# Patient Record
Sex: Male | Born: 1940 | Race: Black or African American | Hispanic: No | Smoking: Former smoker
Health system: Southern US, Community
[De-identification: ages and names within clinical notes are randomized; demographics above are authoritative.]

## PROBLEM LIST (undated history)

## (undated) DIAGNOSIS — Z8611 Personal history of tuberculosis: Secondary | ICD-10-CM

## (undated) DIAGNOSIS — J439 Emphysema, unspecified: Secondary | ICD-10-CM

## (undated) DIAGNOSIS — F32A Depression, unspecified: Secondary | ICD-10-CM

## (undated) DIAGNOSIS — I10 Essential (primary) hypertension: Secondary | ICD-10-CM

## (undated) DIAGNOSIS — Z8673 Personal history of transient ischemic attack (TIA), and cerebral infarction without residual deficits: Secondary | ICD-10-CM

## (undated) DIAGNOSIS — Z21 Asymptomatic human immunodeficiency virus [HIV] infection status: Secondary | ICD-10-CM

## (undated) DIAGNOSIS — C61 Malignant neoplasm of prostate: Secondary | ICD-10-CM

## (undated) DIAGNOSIS — B20 Human immunodeficiency virus [HIV] disease: Secondary | ICD-10-CM

## (undated) DIAGNOSIS — F329 Major depressive disorder, single episode, unspecified: Secondary | ICD-10-CM

## (undated) DIAGNOSIS — Z85038 Personal history of other malignant neoplasm of large intestine: Secondary | ICD-10-CM

## (undated) DIAGNOSIS — Z8619 Personal history of other infectious and parasitic diseases: Secondary | ICD-10-CM

## (undated) DIAGNOSIS — N433 Hydrocele, unspecified: Secondary | ICD-10-CM

## (undated) DIAGNOSIS — K5790 Diverticulosis of intestine, part unspecified, without perforation or abscess without bleeding: Secondary | ICD-10-CM

## (undated) HISTORY — DX: Malignant neoplasm of prostate: C61

## (undated) HISTORY — DX: Diverticulosis of intestine, part unspecified, without perforation or abscess without bleeding: K57.90

## (undated) HISTORY — DX: Personal history of transient ischemic attack (TIA), and cerebral infarction without residual deficits: Z86.73

## (undated) HISTORY — DX: Asymptomatic human immunodeficiency virus (hiv) infection status: Z21

## (undated) HISTORY — DX: Essential (primary) hypertension: I10

## (undated) HISTORY — DX: Emphysema, unspecified: J43.9

## (undated) HISTORY — DX: Hydrocele, unspecified: N43.3

## (undated) HISTORY — DX: Personal history of other malignant neoplasm of large intestine: Z85.038

## (undated) HISTORY — DX: Human immunodeficiency virus (HIV) disease: B20

---

## 1992-05-04 HISTORY — PX: INGUINAL HERNIA REPAIR: SUR1180

## 1993-05-04 ENCOUNTER — Encounter (INDEPENDENT_AMBULATORY_CARE_PROVIDER_SITE_OTHER): Payer: Self-pay | Admitting: *Deleted

## 1993-05-04 LAB — CONVERTED CEMR LAB: CD4 T Cell Abs: 414

## 1997-09-10 ENCOUNTER — Encounter: Admission: RE | Admit: 1997-09-10 | Discharge: 1997-09-10 | Payer: Self-pay | Admitting: Internal Medicine

## 1997-09-25 ENCOUNTER — Encounter: Admission: RE | Admit: 1997-09-25 | Discharge: 1997-09-25 | Payer: Self-pay | Admitting: Internal Medicine

## 1997-12-03 ENCOUNTER — Encounter: Admission: RE | Admit: 1997-12-03 | Discharge: 1997-12-03 | Payer: Self-pay | Admitting: Internal Medicine

## 1997-12-18 ENCOUNTER — Encounter: Admission: RE | Admit: 1997-12-18 | Discharge: 1997-12-18 | Payer: Self-pay | Admitting: Internal Medicine

## 1998-02-25 ENCOUNTER — Encounter: Admission: RE | Admit: 1998-02-25 | Discharge: 1998-02-25 | Payer: Self-pay | Admitting: Internal Medicine

## 1998-02-25 ENCOUNTER — Ambulatory Visit (HOSPITAL_COMMUNITY): Admission: RE | Admit: 1998-02-25 | Discharge: 1998-02-25 | Payer: Self-pay | Admitting: Internal Medicine

## 1998-03-19 ENCOUNTER — Encounter: Admission: RE | Admit: 1998-03-19 | Discharge: 1998-03-19 | Payer: Self-pay | Admitting: Internal Medicine

## 1998-04-10 ENCOUNTER — Encounter: Admission: RE | Admit: 1998-04-10 | Discharge: 1998-04-10 | Payer: Self-pay | Admitting: Infectious Diseases

## 1998-05-13 ENCOUNTER — Encounter: Admission: RE | Admit: 1998-05-13 | Discharge: 1998-05-13 | Payer: Self-pay | Admitting: Internal Medicine

## 1998-07-22 ENCOUNTER — Encounter: Admission: RE | Admit: 1998-07-22 | Discharge: 1998-07-22 | Payer: Self-pay | Admitting: Internal Medicine

## 1998-08-07 ENCOUNTER — Encounter: Admission: RE | Admit: 1998-08-07 | Discharge: 1998-08-07 | Payer: Self-pay | Admitting: Internal Medicine

## 1998-10-22 ENCOUNTER — Ambulatory Visit (HOSPITAL_COMMUNITY): Admission: RE | Admit: 1998-10-22 | Discharge: 1998-10-22 | Payer: Self-pay | Admitting: Internal Medicine

## 1998-11-11 ENCOUNTER — Encounter: Admission: RE | Admit: 1998-11-11 | Discharge: 1998-11-11 | Payer: Self-pay | Admitting: Internal Medicine

## 1999-01-27 ENCOUNTER — Encounter: Admission: RE | Admit: 1999-01-27 | Discharge: 1999-01-27 | Payer: Self-pay | Admitting: Internal Medicine

## 1999-02-17 ENCOUNTER — Encounter: Admission: RE | Admit: 1999-02-17 | Discharge: 1999-02-17 | Payer: Self-pay | Admitting: Internal Medicine

## 1999-02-17 ENCOUNTER — Ambulatory Visit (HOSPITAL_COMMUNITY): Admission: RE | Admit: 1999-02-17 | Discharge: 1999-02-17 | Payer: Self-pay | Admitting: Internal Medicine

## 1999-03-04 ENCOUNTER — Encounter: Admission: RE | Admit: 1999-03-04 | Discharge: 1999-03-04 | Payer: Self-pay | Admitting: Internal Medicine

## 1999-03-24 ENCOUNTER — Encounter: Admission: RE | Admit: 1999-03-24 | Discharge: 1999-03-24 | Payer: Self-pay | Admitting: Internal Medicine

## 1999-04-22 ENCOUNTER — Ambulatory Visit (HOSPITAL_COMMUNITY): Admission: RE | Admit: 1999-04-22 | Discharge: 1999-04-22 | Payer: Self-pay | Admitting: Internal Medicine

## 1999-04-22 ENCOUNTER — Encounter: Payer: Self-pay | Admitting: Internal Medicine

## 1999-04-22 ENCOUNTER — Encounter: Admission: RE | Admit: 1999-04-22 | Discharge: 1999-04-22 | Payer: Self-pay | Admitting: Internal Medicine

## 1999-07-09 ENCOUNTER — Ambulatory Visit (HOSPITAL_COMMUNITY): Admission: RE | Admit: 1999-07-09 | Discharge: 1999-07-09 | Payer: Self-pay | Admitting: Internal Medicine

## 1999-07-09 ENCOUNTER — Encounter: Admission: RE | Admit: 1999-07-09 | Discharge: 1999-07-09 | Payer: Self-pay | Admitting: Internal Medicine

## 1999-08-11 ENCOUNTER — Encounter: Admission: RE | Admit: 1999-08-11 | Discharge: 1999-08-11 | Payer: Self-pay | Admitting: Internal Medicine

## 2000-02-03 ENCOUNTER — Ambulatory Visit (HOSPITAL_COMMUNITY): Admission: RE | Admit: 2000-02-03 | Discharge: 2000-02-03 | Payer: Self-pay | Admitting: Internal Medicine

## 2000-02-03 ENCOUNTER — Encounter: Admission: RE | Admit: 2000-02-03 | Discharge: 2000-02-03 | Payer: Self-pay | Admitting: Internal Medicine

## 2000-02-17 ENCOUNTER — Encounter: Admission: RE | Admit: 2000-02-17 | Discharge: 2000-02-17 | Payer: Self-pay | Admitting: Internal Medicine

## 2000-03-17 ENCOUNTER — Encounter: Admission: RE | Admit: 2000-03-17 | Discharge: 2000-03-17 | Payer: Self-pay | Admitting: Internal Medicine

## 2000-07-09 ENCOUNTER — Encounter: Admission: RE | Admit: 2000-07-09 | Discharge: 2000-07-09 | Payer: Self-pay | Admitting: Internal Medicine

## 2000-07-12 ENCOUNTER — Ambulatory Visit (HOSPITAL_COMMUNITY): Admission: RE | Admit: 2000-07-12 | Discharge: 2000-07-12 | Payer: Self-pay | Admitting: Internal Medicine

## 2000-07-12 ENCOUNTER — Encounter: Admission: RE | Admit: 2000-07-12 | Discharge: 2000-07-12 | Payer: Self-pay | Admitting: Internal Medicine

## 2000-07-23 ENCOUNTER — Encounter: Admission: RE | Admit: 2000-07-23 | Discharge: 2000-07-23 | Payer: Self-pay | Admitting: Internal Medicine

## 2000-09-02 ENCOUNTER — Encounter: Admission: RE | Admit: 2000-09-02 | Discharge: 2000-09-02 | Payer: Self-pay | Admitting: Internal Medicine

## 2000-10-12 ENCOUNTER — Encounter: Admission: RE | Admit: 2000-10-12 | Discharge: 2000-10-12 | Payer: Self-pay | Admitting: Internal Medicine

## 2001-03-07 ENCOUNTER — Ambulatory Visit (HOSPITAL_COMMUNITY): Admission: RE | Admit: 2001-03-07 | Discharge: 2001-03-07 | Payer: Self-pay | Admitting: Internal Medicine

## 2001-03-07 ENCOUNTER — Encounter: Admission: RE | Admit: 2001-03-07 | Discharge: 2001-03-07 | Payer: Self-pay | Admitting: Internal Medicine

## 2001-10-04 ENCOUNTER — Encounter: Admission: RE | Admit: 2001-10-04 | Discharge: 2001-10-04 | Payer: Self-pay | Admitting: Internal Medicine

## 2001-10-04 ENCOUNTER — Ambulatory Visit (HOSPITAL_COMMUNITY): Admission: RE | Admit: 2001-10-04 | Discharge: 2001-10-04 | Payer: Self-pay | Admitting: Internal Medicine

## 2001-10-25 ENCOUNTER — Encounter: Admission: RE | Admit: 2001-10-25 | Discharge: 2001-10-25 | Payer: Self-pay | Admitting: Internal Medicine

## 2001-11-16 ENCOUNTER — Encounter: Payer: Self-pay | Admitting: Cardiovascular Disease

## 2001-11-16 ENCOUNTER — Encounter: Payer: Self-pay | Admitting: *Deleted

## 2001-11-16 ENCOUNTER — Encounter: Payer: Self-pay | Admitting: Internal Medicine

## 2001-11-16 HISTORY — PX: TRANSTHORACIC ECHOCARDIOGRAM: SHX275

## 2001-11-17 ENCOUNTER — Inpatient Hospital Stay (HOSPITAL_COMMUNITY): Admission: EM | Admit: 2001-11-17 | Discharge: 2001-11-18 | Payer: Self-pay | Admitting: Emergency Medicine

## 2001-11-18 ENCOUNTER — Encounter: Payer: Self-pay | Admitting: Internal Medicine

## 2001-11-23 ENCOUNTER — Encounter: Admission: RE | Admit: 2001-11-23 | Discharge: 2001-11-23 | Payer: Self-pay | Admitting: Internal Medicine

## 2001-12-09 ENCOUNTER — Encounter: Admission: RE | Admit: 2001-12-09 | Discharge: 2002-03-09 | Payer: Self-pay | Admitting: Infectious Diseases

## 2001-12-27 ENCOUNTER — Encounter: Admission: RE | Admit: 2001-12-27 | Discharge: 2001-12-27 | Payer: Self-pay | Admitting: Internal Medicine

## 2002-03-27 ENCOUNTER — Encounter: Admission: RE | Admit: 2002-03-27 | Discharge: 2002-03-27 | Payer: Self-pay | Admitting: Internal Medicine

## 2002-03-27 ENCOUNTER — Ambulatory Visit (HOSPITAL_COMMUNITY): Admission: RE | Admit: 2002-03-27 | Discharge: 2002-03-27 | Payer: Self-pay | Admitting: Internal Medicine

## 2002-05-22 ENCOUNTER — Encounter: Admission: RE | Admit: 2002-05-22 | Discharge: 2002-05-22 | Payer: Self-pay | Admitting: Internal Medicine

## 2002-06-08 ENCOUNTER — Encounter: Admission: RE | Admit: 2002-06-08 | Discharge: 2002-06-08 | Payer: Self-pay | Admitting: Internal Medicine

## 2002-08-01 ENCOUNTER — Encounter: Admission: RE | Admit: 2002-08-01 | Discharge: 2002-08-01 | Payer: Self-pay | Admitting: Internal Medicine

## 2002-11-15 ENCOUNTER — Ambulatory Visit (HOSPITAL_COMMUNITY): Admission: RE | Admit: 2002-11-15 | Discharge: 2002-11-15 | Payer: Self-pay | Admitting: Internal Medicine

## 2002-11-15 ENCOUNTER — Encounter: Admission: RE | Admit: 2002-11-15 | Discharge: 2002-11-15 | Payer: Self-pay | Admitting: Internal Medicine

## 2002-11-29 ENCOUNTER — Encounter: Admission: RE | Admit: 2002-11-29 | Discharge: 2002-11-29 | Payer: Self-pay | Admitting: Internal Medicine

## 2003-03-20 ENCOUNTER — Ambulatory Visit (HOSPITAL_COMMUNITY): Admission: RE | Admit: 2003-03-20 | Discharge: 2003-03-20 | Payer: Self-pay | Admitting: Internal Medicine

## 2003-03-20 ENCOUNTER — Encounter: Admission: RE | Admit: 2003-03-20 | Discharge: 2003-03-20 | Payer: Self-pay | Admitting: Internal Medicine

## 2003-03-20 ENCOUNTER — Encounter: Payer: Self-pay | Admitting: Internal Medicine

## 2003-04-03 ENCOUNTER — Encounter: Admission: RE | Admit: 2003-04-03 | Discharge: 2003-04-03 | Payer: Self-pay | Admitting: Internal Medicine

## 2003-07-11 ENCOUNTER — Ambulatory Visit (HOSPITAL_COMMUNITY): Admission: RE | Admit: 2003-07-11 | Discharge: 2003-07-11 | Payer: Self-pay | Admitting: Internal Medicine

## 2003-07-11 ENCOUNTER — Encounter: Admission: RE | Admit: 2003-07-11 | Discharge: 2003-07-11 | Payer: Self-pay | Admitting: Internal Medicine

## 2003-07-26 ENCOUNTER — Encounter: Admission: RE | Admit: 2003-07-26 | Discharge: 2003-07-26 | Payer: Self-pay | Admitting: Internal Medicine

## 2003-11-12 ENCOUNTER — Encounter: Admission: RE | Admit: 2003-11-12 | Discharge: 2003-11-12 | Payer: Self-pay | Admitting: Internal Medicine

## 2003-11-12 ENCOUNTER — Ambulatory Visit (HOSPITAL_COMMUNITY): Admission: RE | Admit: 2003-11-12 | Discharge: 2003-11-12 | Payer: Self-pay | Admitting: Internal Medicine

## 2003-11-26 ENCOUNTER — Encounter: Admission: RE | Admit: 2003-11-26 | Discharge: 2003-11-26 | Payer: Self-pay | Admitting: Internal Medicine

## 2004-03-04 ENCOUNTER — Ambulatory Visit: Payer: Self-pay | Admitting: Internal Medicine

## 2004-03-04 ENCOUNTER — Ambulatory Visit (HOSPITAL_COMMUNITY): Admission: RE | Admit: 2004-03-04 | Discharge: 2004-03-04 | Payer: Self-pay | Admitting: Internal Medicine

## 2004-03-18 ENCOUNTER — Ambulatory Visit: Payer: Self-pay | Admitting: Internal Medicine

## 2004-09-15 ENCOUNTER — Ambulatory Visit: Payer: Self-pay | Admitting: Internal Medicine

## 2004-09-15 ENCOUNTER — Ambulatory Visit (HOSPITAL_COMMUNITY): Admission: RE | Admit: 2004-09-15 | Discharge: 2004-09-15 | Payer: Self-pay | Admitting: Internal Medicine

## 2004-09-30 ENCOUNTER — Ambulatory Visit: Payer: Self-pay | Admitting: Internal Medicine

## 2005-01-21 ENCOUNTER — Encounter (INDEPENDENT_AMBULATORY_CARE_PROVIDER_SITE_OTHER): Payer: Self-pay | Admitting: *Deleted

## 2005-01-21 ENCOUNTER — Ambulatory Visit (HOSPITAL_COMMUNITY): Admission: RE | Admit: 2005-01-21 | Discharge: 2005-01-21 | Payer: Self-pay | Admitting: Internal Medicine

## 2005-01-21 ENCOUNTER — Ambulatory Visit: Payer: Self-pay | Admitting: Internal Medicine

## 2005-02-10 ENCOUNTER — Ambulatory Visit: Payer: Self-pay | Admitting: Internal Medicine

## 2005-07-08 ENCOUNTER — Encounter (INDEPENDENT_AMBULATORY_CARE_PROVIDER_SITE_OTHER): Payer: Self-pay | Admitting: *Deleted

## 2005-07-08 ENCOUNTER — Encounter: Payer: Self-pay | Admitting: Internal Medicine

## 2005-07-08 ENCOUNTER — Ambulatory Visit: Payer: Self-pay | Admitting: Internal Medicine

## 2005-07-08 ENCOUNTER — Encounter: Admission: RE | Admit: 2005-07-08 | Discharge: 2005-07-08 | Payer: Self-pay | Admitting: Internal Medicine

## 2005-07-08 LAB — CONVERTED CEMR LAB
CD4 % Helper T Cell: 35 %
CD4 Count: 600 microliters

## 2005-07-23 ENCOUNTER — Ambulatory Visit: Payer: Self-pay | Admitting: Internal Medicine

## 2005-12-30 ENCOUNTER — Ambulatory Visit: Payer: Self-pay | Admitting: Internal Medicine

## 2005-12-30 ENCOUNTER — Encounter: Admission: RE | Admit: 2005-12-30 | Discharge: 2005-12-30 | Payer: Self-pay | Admitting: Internal Medicine

## 2005-12-30 ENCOUNTER — Encounter (INDEPENDENT_AMBULATORY_CARE_PROVIDER_SITE_OTHER): Payer: Self-pay | Admitting: *Deleted

## 2005-12-30 ENCOUNTER — Encounter: Payer: Self-pay | Admitting: Internal Medicine

## 2005-12-30 LAB — CONVERTED CEMR LAB
CD4 Count: 1000 microliters
HIV 1 RNA Quant: 49 copies/mL

## 2006-01-19 ENCOUNTER — Ambulatory Visit: Payer: Self-pay | Admitting: Internal Medicine

## 2006-02-02 ENCOUNTER — Ambulatory Visit: Payer: Self-pay | Admitting: Gastroenterology

## 2006-02-11 ENCOUNTER — Encounter: Payer: Self-pay | Admitting: Gastroenterology

## 2006-02-11 ENCOUNTER — Ambulatory Visit (HOSPITAL_COMMUNITY): Admission: RE | Admit: 2006-02-11 | Discharge: 2006-02-11 | Payer: Self-pay | Admitting: Gastroenterology

## 2006-02-11 ENCOUNTER — Encounter (INDEPENDENT_AMBULATORY_CARE_PROVIDER_SITE_OTHER): Payer: Self-pay | Admitting: Specialist

## 2006-02-18 ENCOUNTER — Ambulatory Visit: Payer: Self-pay | Admitting: Gastroenterology

## 2006-02-23 ENCOUNTER — Ambulatory Visit: Payer: Self-pay | Admitting: Cardiology

## 2006-03-09 ENCOUNTER — Encounter: Payer: Self-pay | Admitting: Gastroenterology

## 2006-03-09 ENCOUNTER — Encounter (INDEPENDENT_AMBULATORY_CARE_PROVIDER_SITE_OTHER): Payer: Self-pay | Admitting: Specialist

## 2006-03-09 ENCOUNTER — Inpatient Hospital Stay (HOSPITAL_COMMUNITY): Admission: RE | Admit: 2006-03-09 | Discharge: 2006-03-14 | Payer: Self-pay | Admitting: Surgery

## 2006-03-09 HISTORY — PX: OTHER SURGICAL HISTORY: SHX169

## 2006-03-10 ENCOUNTER — Ambulatory Visit: Payer: Self-pay | Admitting: Infectious Diseases

## 2006-03-30 ENCOUNTER — Ambulatory Visit: Payer: Self-pay | Admitting: Hematology and Oncology

## 2006-04-06 LAB — CBC WITH DIFFERENTIAL/PLATELET
Basophils Absolute: 0.1 10*3/uL (ref 0.0–0.1)
EOS%: 5 % (ref 0.0–7.0)
Eosinophils Absolute: 0.4 10*3/uL (ref 0.0–0.5)
HGB: 13.1 g/dL (ref 13.0–17.1)
LYMPH%: 24.9 % (ref 14.0–48.0)
MCH: 27.4 pg — ABNORMAL LOW (ref 28.0–33.4)
MCV: 83.9 fL (ref 81.6–98.0)
MONO%: 7.2 % (ref 0.0–13.0)
Platelets: 294 10*3/uL (ref 145–400)
RBC: 4.8 10*6/uL (ref 4.20–5.71)
RDW: 12.6 % (ref 11.2–14.6)

## 2006-04-06 LAB — COMPREHENSIVE METABOLIC PANEL
AST: 16 U/L (ref 0–37)
Albumin: 4.3 g/dL (ref 3.5–5.2)
Alkaline Phosphatase: 72 U/L (ref 39–117)
BUN: 10 mg/dL (ref 6–23)
Glucose, Bld: 107 mg/dL — ABNORMAL HIGH (ref 70–99)
Potassium: 3.6 mEq/L (ref 3.5–5.3)
Total Bilirubin: 0.2 mg/dL — ABNORMAL LOW (ref 0.3–1.2)

## 2006-04-06 LAB — CEA: CEA: 1.4 ng/mL (ref 0.0–5.0)

## 2006-05-15 ENCOUNTER — Encounter: Payer: Self-pay | Admitting: Internal Medicine

## 2006-05-15 DIAGNOSIS — B20 Human immunodeficiency virus [HIV] disease: Secondary | ICD-10-CM | POA: Insufficient documentation

## 2006-05-15 DIAGNOSIS — I10 Essential (primary) hypertension: Secondary | ICD-10-CM | POA: Insufficient documentation

## 2006-05-15 DIAGNOSIS — Z8619 Personal history of other infectious and parasitic diseases: Secondary | ICD-10-CM

## 2006-05-15 DIAGNOSIS — Z8719 Personal history of other diseases of the digestive system: Secondary | ICD-10-CM | POA: Insufficient documentation

## 2006-05-15 DIAGNOSIS — Z87891 Personal history of nicotine dependence: Secondary | ICD-10-CM

## 2006-05-15 DIAGNOSIS — A539 Syphilis, unspecified: Secondary | ICD-10-CM | POA: Insufficient documentation

## 2006-05-15 DIAGNOSIS — A150 Tuberculosis of lung: Secondary | ICD-10-CM | POA: Insufficient documentation

## 2006-05-15 DIAGNOSIS — F528 Other sexual dysfunction not due to a substance or known physiological condition: Secondary | ICD-10-CM | POA: Insufficient documentation

## 2006-05-15 DIAGNOSIS — Z8673 Personal history of transient ischemic attack (TIA), and cerebral infarction without residual deficits: Secondary | ICD-10-CM | POA: Insufficient documentation

## 2006-06-22 ENCOUNTER — Encounter: Admission: RE | Admit: 2006-06-22 | Discharge: 2006-06-22 | Payer: Self-pay | Admitting: Internal Medicine

## 2006-06-22 ENCOUNTER — Encounter (INDEPENDENT_AMBULATORY_CARE_PROVIDER_SITE_OTHER): Payer: Self-pay | Admitting: *Deleted

## 2006-06-22 ENCOUNTER — Ambulatory Visit: Payer: Self-pay | Admitting: Internal Medicine

## 2006-06-22 LAB — CONVERTED CEMR LAB: CD4 Count: 840 microliters

## 2006-06-28 ENCOUNTER — Encounter (INDEPENDENT_AMBULATORY_CARE_PROVIDER_SITE_OTHER): Payer: Self-pay | Admitting: *Deleted

## 2006-06-28 LAB — CONVERTED CEMR LAB

## 2006-07-11 ENCOUNTER — Encounter (INDEPENDENT_AMBULATORY_CARE_PROVIDER_SITE_OTHER): Payer: Self-pay | Admitting: *Deleted

## 2006-07-13 DIAGNOSIS — Z9889 Other specified postprocedural states: Secondary | ICD-10-CM

## 2006-07-22 ENCOUNTER — Ambulatory Visit: Payer: Self-pay | Admitting: Internal Medicine

## 2006-07-28 ENCOUNTER — Ambulatory Visit: Payer: Self-pay | Admitting: Hematology and Oncology

## 2006-07-30 LAB — COMPREHENSIVE METABOLIC PANEL
ALT: 24 U/L (ref 0–53)
AST: 17 U/L (ref 0–37)
Albumin: 4.2 g/dL (ref 3.5–5.2)
Alkaline Phosphatase: 72 U/L (ref 39–117)
BUN: 18 mg/dL (ref 6–23)
Chloride: 100 mEq/L (ref 96–112)
Potassium: 3.9 mEq/L (ref 3.5–5.3)
Sodium: 139 mEq/L (ref 135–145)

## 2006-07-30 LAB — CBC WITH DIFFERENTIAL/PLATELET
BASO%: 1.4 % (ref 0.0–2.0)
EOS%: 3.7 % (ref 0.0–7.0)
MCH: 28 pg (ref 28.0–33.4)
MCHC: 33.4 g/dL (ref 32.0–35.9)
MCV: 83.7 fL (ref 81.6–98.0)
MONO%: 7.2 % (ref 0.0–13.0)
RBC: 4.97 10*6/uL (ref 4.20–5.71)
RDW: 15 % — ABNORMAL HIGH (ref 11.2–14.6)
lymph#: 1.9 10*3/uL (ref 0.9–3.3)

## 2006-08-03 ENCOUNTER — Ambulatory Visit (HOSPITAL_COMMUNITY): Admission: RE | Admit: 2006-08-03 | Discharge: 2006-08-03 | Payer: Self-pay | Admitting: Hematology and Oncology

## 2006-08-05 ENCOUNTER — Encounter: Payer: Self-pay | Admitting: Internal Medicine

## 2006-08-18 ENCOUNTER — Ambulatory Visit (HOSPITAL_COMMUNITY): Admission: RE | Admit: 2006-08-18 | Discharge: 2006-08-18 | Payer: Self-pay | Admitting: Gastroenterology

## 2006-08-18 ENCOUNTER — Ambulatory Visit: Payer: Self-pay | Admitting: Gastroenterology

## 2006-12-06 ENCOUNTER — Ambulatory Visit: Payer: Self-pay | Admitting: Hematology and Oncology

## 2006-12-20 ENCOUNTER — Encounter: Admission: RE | Admit: 2006-12-20 | Discharge: 2006-12-20 | Payer: Self-pay | Admitting: Internal Medicine

## 2006-12-20 ENCOUNTER — Ambulatory Visit: Payer: Self-pay | Admitting: Internal Medicine

## 2006-12-20 LAB — CONVERTED CEMR LAB
HIV 1 RNA Quant: <50 copies/mL (ref <50)
HIV-1 RNA Quant, Log: <1.7 (ref <1.70)

## 2006-12-22 ENCOUNTER — Encounter: Payer: Self-pay | Admitting: Internal Medicine

## 2006-12-22 LAB — CBC WITH DIFFERENTIAL/PLATELET
Basophils Absolute: 0.1 10*3/uL (ref 0.0–0.1)
EOS%: 2.6 % (ref 0.0–7.0)
Eosinophils Absolute: 0.2 10*3/uL (ref 0.0–0.5)
LYMPH%: 22.6 % (ref 14.0–48.0)
MCH: 28.9 pg (ref 28.0–33.4)
MCV: 85 fL (ref 81.6–98.0)
MONO%: 4.8 % (ref 0.0–13.0)
NEUT#: 6 10*3/uL (ref 1.5–6.5)
Platelets: 240 10*3/uL (ref 145–400)
RBC: 5 10*6/uL (ref 4.20–5.71)

## 2006-12-22 LAB — COMPREHENSIVE METABOLIC PANEL
AST: 21 U/L (ref 0–37)
Alkaline Phosphatase: 77 U/L (ref 39–117)
BUN: 11 mg/dL (ref 6–23)
Glucose, Bld: 84 mg/dL (ref 70–99)
Total Bilirubin: 0.3 mg/dL (ref 0.3–1.2)

## 2007-01-10 ENCOUNTER — Ambulatory Visit: Payer: Self-pay | Admitting: Internal Medicine

## 2007-01-10 LAB — CONVERTED CEMR LAB
ALT: 18 units/L (ref 0–53)
AST: 14 units/L (ref 0–37)
Albumin: 4.3 g/dL (ref 3.5–5.2)
Alkaline Phosphatase: 76 units/L (ref 39–117)
Calcium: 9.9 mg/dL (ref 8.4–10.5)
Chloride: 105 meq/L (ref 96–112)
MCHC: 33.6 g/dL (ref 30.0–36.0)
Platelets: 267 10*3/uL (ref 150–400)
Potassium: 3.8 meq/L (ref 3.5–5.3)
RDW: 14.4 % — ABNORMAL HIGH (ref 11.5–14.0)
Sodium: 141 meq/L (ref 135–145)
Total Protein: 7.9 g/dL (ref 6.0–8.3)

## 2007-01-24 ENCOUNTER — Encounter: Payer: Self-pay | Admitting: Internal Medicine

## 2007-02-03 ENCOUNTER — Ambulatory Visit: Payer: Self-pay | Admitting: Gastroenterology

## 2007-02-15 ENCOUNTER — Ambulatory Visit: Payer: Self-pay | Admitting: Internal Medicine

## 2007-02-17 ENCOUNTER — Ambulatory Visit: Payer: Self-pay | Admitting: Gastroenterology

## 2007-03-22 ENCOUNTER — Ambulatory Visit: Payer: Self-pay | Admitting: Hematology and Oncology

## 2007-03-24 ENCOUNTER — Encounter: Payer: Self-pay | Admitting: Internal Medicine

## 2007-03-24 LAB — COMPREHENSIVE METABOLIC PANEL
ALT: 23 U/L (ref 0–53)
AST: 22 U/L (ref 0–37)
Albumin: 3.8 g/dL (ref 3.5–5.2)
Alkaline Phosphatase: 75 U/L (ref 39–117)
Calcium: 9.7 mg/dL (ref 8.4–10.5)
Chloride: 102 mEq/L (ref 96–112)
Potassium: 3.7 mEq/L (ref 3.5–5.3)

## 2007-03-24 LAB — CBC WITH DIFFERENTIAL/PLATELET
BASO%: 0.8 % (ref 0.0–2.0)
EOS%: 5.4 % (ref 0.0–7.0)
HCT: 41.5 % (ref 38.7–49.9)
HGB: 14 g/dL (ref 13.0–17.1)
MCV: 85.3 fL (ref 81.6–98.0)
MONO%: 7.1 % (ref 0.0–13.0)
NEUT#: 3.4 10*3/uL (ref 1.5–6.5)
Platelets: 221 10*3/uL (ref 145–400)
RBC: 4.87 10*6/uL (ref 4.20–5.71)
RDW: 15.1 % — ABNORMAL HIGH (ref 11.2–14.6)
WBC: 6.5 10*3/uL (ref 4.0–10.0)
lymph#: 2.2 10*3/uL (ref 0.9–3.3)

## 2007-04-11 ENCOUNTER — Telehealth: Payer: Self-pay | Admitting: Internal Medicine

## 2007-05-03 ENCOUNTER — Encounter: Payer: Self-pay | Admitting: Internal Medicine

## 2007-07-06 ENCOUNTER — Encounter: Admission: RE | Admit: 2007-07-06 | Discharge: 2007-07-06 | Payer: Self-pay | Admitting: Internal Medicine

## 2007-07-06 ENCOUNTER — Ambulatory Visit: Payer: Self-pay | Admitting: Internal Medicine

## 2007-07-06 LAB — CONVERTED CEMR LAB
ALT: 20 units/L (ref 0–53)
AST: 17 units/L (ref 0–37)
Albumin: 4 g/dL (ref 3.5–5.2)
Alkaline Phosphatase: 81 units/L (ref 39–117)
BUN: 9 mg/dL (ref 6–23)
HDL: 33 mg/dL — ABNORMAL LOW (ref >39)
HIV-1 RNA Quant, Log: <1.7 (ref <1.70)
Hemoglobin: 14.3 g/dL (ref 13.0–17.0)
LDL Cholesterol: 90 mg/dL (ref 0–99)
MCHC: 32.8 g/dL (ref 30.0–36.0)
Platelets: 251 10*3/uL (ref 150–400)
Potassium: 3.9 meq/L (ref 3.5–5.3)
RDW: 14.9 % (ref 11.5–15.5)
RPR Ser Ql: REACTIVE — AB
RPR Titer: 1:8 {titer}
Total CHOL/HDL Ratio: 4.5

## 2007-07-19 ENCOUNTER — Ambulatory Visit: Payer: Self-pay | Admitting: Internal Medicine

## 2007-08-16 ENCOUNTER — Ambulatory Visit: Payer: Self-pay | Admitting: Hematology and Oncology

## 2007-08-18 ENCOUNTER — Ambulatory Visit (HOSPITAL_COMMUNITY): Admission: RE | Admit: 2007-08-18 | Discharge: 2007-08-18 | Payer: Self-pay | Admitting: Hematology and Oncology

## 2007-08-24 ENCOUNTER — Encounter: Payer: Self-pay | Admitting: Internal Medicine

## 2007-08-24 LAB — CBC WITH DIFFERENTIAL/PLATELET
BASO%: 0.6 % (ref 0.0–2.0)
EOS%: 2 % (ref 0.0–7.0)
MCH: 28.7 pg (ref 28.0–33.4)
MCHC: 33.9 g/dL (ref 32.0–35.9)
MCV: 84.6 fL (ref 81.6–98.0)
MONO%: 3.8 % (ref 0.0–13.0)
RBC: 5.34 10*6/uL (ref 4.20–5.71)
RDW: 14.1 % (ref 11.2–14.6)
lymph#: 2 10*3/uL (ref 0.9–3.3)

## 2007-08-24 LAB — COMPREHENSIVE METABOLIC PANEL
ALT: 30 U/L (ref 0–53)
AST: 24 U/L (ref 0–37)
Albumin: 4.2 g/dL (ref 3.5–5.2)
Alkaline Phosphatase: 87 U/L (ref 39–117)
Calcium: 9.3 mg/dL (ref 8.4–10.5)
Chloride: 99 mEq/L (ref 96–112)
Potassium: 3.2 mEq/L — ABNORMAL LOW (ref 3.5–5.3)

## 2007-11-07 IMAGING — CR DG ABDOMEN 1V
1 series · 1 of 1 positions shown · non-contrast
Comparison: 08/03/06.

CLINICAL DATA: Constipation and abdominal pain.  
 ABDOMEN ? 1 VIEW:

[t abdomen supine]
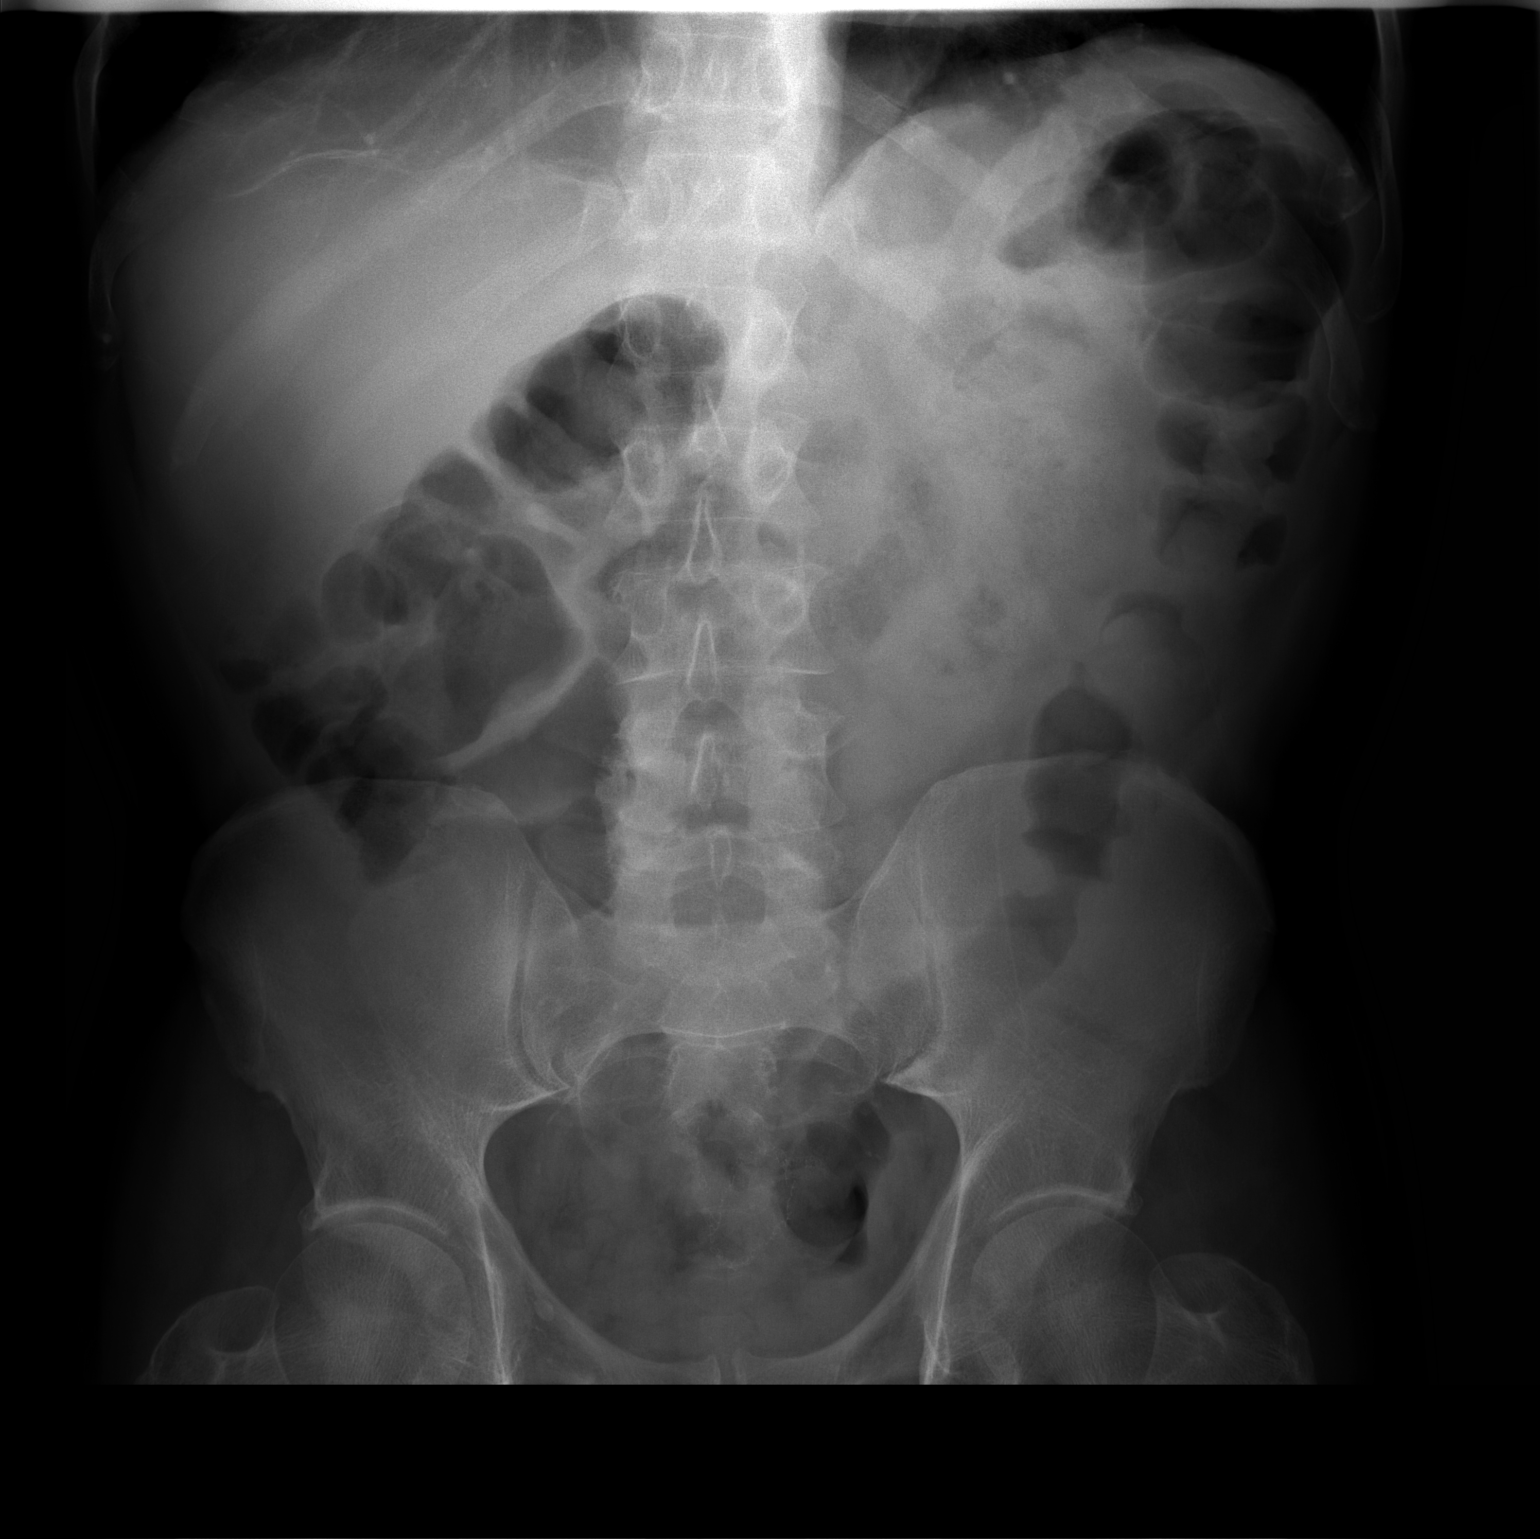

[1 of 1 positions shown; findings below may reference images not displayed]

FINDINGS: Gas and stool are seen in the colon.  No small bowel dilatation.
IMPRESSION: No acute findings.

## 2007-12-13 ENCOUNTER — Encounter: Admission: RE | Admit: 2007-12-13 | Discharge: 2007-12-13 | Payer: Self-pay | Admitting: Internal Medicine

## 2007-12-13 ENCOUNTER — Ambulatory Visit: Payer: Self-pay | Admitting: Internal Medicine

## 2007-12-13 LAB — CONVERTED CEMR LAB
ALT: 21 units/L (ref 0–53)
AST: 17 units/L (ref 0–37)
Creatinine, Ser: 1.03 mg/dL (ref 0.40–1.50)
HCT: 42.4 % (ref 39.0–52.0)
HIV-1 RNA Quant, Log: 1.97 — ABNORMAL HIGH (ref <1.70)
MCV: 85.3 fL (ref 78.0–100.0)
Platelets: 243 10*3/uL (ref 150–400)
RDW: 15.7 % — ABNORMAL HIGH (ref 11.5–15.5)
Total Bilirubin: 0.3 mg/dL (ref 0.3–1.2)

## 2007-12-27 ENCOUNTER — Ambulatory Visit: Payer: Self-pay | Admitting: Internal Medicine

## 2008-03-13 ENCOUNTER — Telehealth (INDEPENDENT_AMBULATORY_CARE_PROVIDER_SITE_OTHER): Payer: Self-pay | Admitting: *Deleted

## 2008-03-16 ENCOUNTER — Ambulatory Visit: Payer: Self-pay | Admitting: Internal Medicine

## 2008-03-16 DIAGNOSIS — K5900 Constipation, unspecified: Secondary | ICD-10-CM | POA: Insufficient documentation

## 2008-04-10 ENCOUNTER — Telehealth: Payer: Self-pay | Admitting: Internal Medicine

## 2008-05-24 ENCOUNTER — Ambulatory Visit: Payer: Self-pay | Admitting: Internal Medicine

## 2008-05-24 LAB — CONVERTED CEMR LAB
AST: 20 units/L (ref 0–37)
Albumin: 4.2 g/dL (ref 3.5–5.2)
BUN: 20 mg/dL (ref 6–23)
Basophils Relative: 1 % (ref 0–1)
Calcium: 9.7 mg/dL (ref 8.4–10.5)
Chloride: 102 meq/L (ref 96–112)
Glucose, Bld: 86 mg/dL (ref 70–99)
HDL: 39 mg/dL — ABNORMAL LOW (ref >39)
HIV 1 RNA Quant: <48 copies/mL (ref <48)
HIV-1 RNA Quant, Log: <1.68 (ref <1.68)
Lymphs Abs: 2 10*3/uL (ref 0.7–4.0)
Monocytes Relative: 6 % (ref 3–12)
Neutro Abs: 4.6 10*3/uL (ref 1.7–7.7)
Neutrophils Relative %: 62 % (ref 43–77)
Potassium: 4 meq/L (ref 3.5–5.3)
RBC: 5.34 M/uL (ref 4.22–5.81)
RPR Ser Ql: REACTIVE — AB
RPR Titer: 1:2 {titer}
WBC: 7.3 10*3/uL (ref 4.0–10.5)

## 2008-06-07 ENCOUNTER — Ambulatory Visit: Payer: Self-pay | Admitting: Internal Medicine

## 2008-06-27 DIAGNOSIS — D126 Benign neoplasm of colon, unspecified: Secondary | ICD-10-CM

## 2008-06-28 ENCOUNTER — Ambulatory Visit: Payer: Self-pay | Admitting: Gastroenterology

## 2008-08-06 ENCOUNTER — Ambulatory Visit: Payer: Self-pay | Admitting: Gastroenterology

## 2008-12-03 ENCOUNTER — Ambulatory Visit: Payer: Self-pay | Admitting: Internal Medicine

## 2008-12-03 LAB — CONVERTED CEMR LAB
HIV 1 RNA Quant: 77 copies/mL — ABNORMAL HIGH (ref <48)
HIV-1 RNA Quant, Log: 1.89 — ABNORMAL HIGH (ref <1.68)

## 2008-12-18 ENCOUNTER — Ambulatory Visit: Payer: Self-pay | Admitting: Internal Medicine

## 2008-12-18 DIAGNOSIS — R634 Abnormal weight loss: Secondary | ICD-10-CM | POA: Insufficient documentation

## 2009-03-18 ENCOUNTER — Ambulatory Visit: Payer: Self-pay | Admitting: Internal Medicine

## 2009-03-18 LAB — CONVERTED CEMR LAB
ALT: 16 units/L (ref 0–53)
AST: 17 units/L (ref 0–37)
Albumin: 4.4 g/dL (ref 3.5–5.2)
Alkaline Phosphatase: 75 units/L (ref 39–117)
Calcium: 9.5 mg/dL (ref 8.4–10.5)
Chloride: 102 meq/L (ref 96–112)
HCT: 43.8 % (ref 39.0–52.0)
Lymphocytes Relative: 26 % (ref 12–46)
Lymphs Abs: 1.7 10*3/uL (ref 0.7–4.0)
Neutro Abs: 4.4 10*3/uL (ref 1.7–7.7)
Neutrophils Relative %: 65 % (ref 43–77)
Platelets: 232 10*3/uL (ref 150–400)
Potassium: 4.1 meq/L (ref 3.5–5.3)
Sodium: 140 meq/L (ref 135–145)
WBC: 6.8 10*3/uL (ref 4.0–10.5)

## 2009-04-02 ENCOUNTER — Ambulatory Visit: Payer: Self-pay | Admitting: Internal Medicine

## 2009-10-29 ENCOUNTER — Ambulatory Visit: Payer: Self-pay | Admitting: Internal Medicine

## 2009-10-29 LAB — CONVERTED CEMR LAB
ALT: 18 units/L (ref 0–53)
BUN: 12 mg/dL (ref 6–23)
Basophils Relative: 1 % (ref 0–1)
CO2: 26 meq/L (ref 19–32)
Calcium: 9.9 mg/dL (ref 8.4–10.5)
Cholesterol: 182 mg/dL (ref 0–200)
Creatinine, Ser: 1.09 mg/dL (ref 0.40–1.50)
Eosinophils Absolute: 0.2 10*3/uL (ref 0.0–0.7)
Eosinophils Relative: 2 % (ref 0–5)
HCT: 44 % (ref 39.0–52.0)
HDL: 45 mg/dL (ref >39)
HIV-1 RNA Quant, Log: <1.68 (ref <1.68)
MCHC: 33.4 g/dL (ref 30.0–36.0)
MCV: 86.3 fL (ref 78.0–100.0)
Monocytes Relative: 5 % (ref 3–12)
Neutrophils Relative %: 67 % (ref 43–77)
Platelets: 224 10*3/uL (ref 150–400)
RPR Titer: 1:4 {titer}
Total Bilirubin: 0.4 mg/dL (ref 0.3–1.2)
Total CHOL/HDL Ratio: 4
VLDL: 19 mg/dL (ref 0–40)

## 2009-11-21 ENCOUNTER — Ambulatory Visit: Payer: Self-pay | Admitting: Internal Medicine

## 2010-04-03 ENCOUNTER — Ambulatory Visit: Payer: Self-pay | Admitting: Internal Medicine

## 2010-04-03 LAB — CONVERTED CEMR LAB
ALT: 19 units/L (ref 0–53)
Albumin: 4.3 g/dL (ref 3.5–5.2)
CO2: 27 meq/L (ref 19–32)
Calcium: 9.2 mg/dL (ref 8.4–10.5)
Chloride: 102 meq/L (ref 96–112)
Cholesterol: 165 mg/dL (ref 0–200)
Glucose, Bld: 91 mg/dL (ref 70–99)
HIV 1 RNA Quant: <20 copies/mL (ref <20)
Potassium: 5.2 meq/L (ref 3.5–5.3)
RPR Titer: 1:4 {titer}
Sodium: 139 meq/L (ref 135–145)
T pallidum Antibodies (TP-PA): >8 — ABNORMAL HIGH (ref <0.90)
Total Protein: 7.4 g/dL (ref 6.0–8.3)
Triglycerides: 53 mg/dL (ref <150)
VLDL: 11 mg/dL (ref 0–40)

## 2010-04-14 ENCOUNTER — Ambulatory Visit: Payer: Self-pay | Admitting: Gastroenterology

## 2010-04-22 ENCOUNTER — Ambulatory Visit: Payer: Self-pay | Admitting: Internal Medicine

## 2010-04-22 DIAGNOSIS — Z85048 Personal history of other malignant neoplasm of rectum, rectosigmoid junction, and anus: Secondary | ICD-10-CM

## 2010-05-04 DIAGNOSIS — N433 Hydrocele, unspecified: Secondary | ICD-10-CM

## 2010-05-04 HISTORY — DX: Hydrocele, unspecified: N43.3

## 2010-06-01 LAB — CONVERTED CEMR LAB
ALT: 25 units/L (ref 0–53)
Basophils Absolute: 0.1 10*3/uL (ref 0.0–0.1)
CD4 Count: 840 microliters
CO2: 32 meq/L (ref 19–32)
Calcium: 9.5 mg/dL (ref 8.4–10.5)
Chloride: 102 meq/L (ref 96–112)
Cholesterol: 138 mg/dL (ref 0–200)
Creatinine, Ser: 0.98 mg/dL (ref 0.40–1.50)
Eosinophils Relative: 4 % (ref 0–5)
Glucose, Bld: 90 mg/dL (ref 70–99)
HCT: 42.9 % (ref 39.0–52.0)
HIV 1 RNA Quant: 231 copies/mL — ABNORMAL HIGH (ref <50)
Hemoglobin: 13.7 g/dL (ref 13.0–17.0)
Lymphocytes Relative: 27 % (ref 12–46)
Lymphs Abs: 2.2 10*3/uL (ref 0.7–3.3)
Monocytes Absolute: 0.5 10*3/uL (ref 0.2–0.7)
Neutro Abs: 4.9 10*3/uL (ref 1.7–7.7)
RBC: 4.97 M/uL (ref 4.22–5.81)
RPR Titer: 1:2 {titer}
Total Bilirubin: 0.3 mg/dL (ref 0.3–1.2)
Total CHOL/HDL Ratio: 4.2
Total Protein: 7.4 g/dL (ref 6.0–8.3)
Triglycerides: 181 mg/dL — ABNORMAL HIGH (ref <150)
VLDL: 36 mg/dL (ref 0–40)
WBC: 7.9 10*3/uL (ref 4.0–10.5)

## 2010-06-05 NOTE — Assessment & Plan Note (Signed)
Summary: RESHFROM 7/12  F/U [MKJ]   CC:  follow-up visit, some problems w/ hard stools, and using OTC stool softener.  RN reminded pt. to drink more fluids to make the stool stay soft.  Preventive Screening-Counseling & Management  Alcohol-Tobacco     Alcohol drinks/day: 0     Smoking Status: quit     Smoking Cessation Counseling: 02/10/2005     Packs/Day: 0.25     Year Quit: 2007     Passive Smoke Exposure: yes  Caffeine-Diet-Exercise     Caffeine use/day: yes     Does Patient Exercise: no     Type of exercise: alot of waliking at his job     Times/week: 5  Hep-HIV-STD-Contraception     HIV Risk: no risk noted     HIV Risk Counseling: to avoid increased HIV risk  Safety-Violence-Falls     Seat Belt Use: yes  Comments: declined condoms      Sexual History:  currently monogamous.        Drug Use:  never.     Current Allergies (reviewed today): No known allergies  Social History: Sexual History:  currently monogamous  Vital Signs:  Patient profile:   70 year old male Height:      66 inches (167.64 cm) Weight:      145.75 pounds (66.25 kg) BMI:     23.61 Temp:     98.6 degrees F (37.00 degrees C) oral Pulse rate:   84 / minute BP sitting:   143 / 87  (left arm) Cuff size:   regular  Vitals Entered By: Jennet Maduro RN (November 21, 2009 9:34 AM) CC: follow-up visit, some problems w/ hard stools, using OTC stool softener.  RN reminded pt. to drink more fluids to make the stool stay soft Is Patient Diabetic? No Pain Assessment Patient in pain? no      Nutritional Status BMI of 19 -24 = normal Nutritional Status Detail appetite "OK"  Have you ever been in a relationship where you felt threatened, hurt or afraid?No   Does patient need assistance? Functional Status Self care Ambulation Normal Comments missed one night time dose      Appended Document: RESHFROM 7/12  F/U [MKJ]     Primary Provider:  Cliffton Asters MD   History of Present  Illness: Charles Daniel is in for his routine visit.  He denies missing a single dose of his Atripla  since his last visit.  He does, however, say that he tends to miss his blood pressure medication. He takes it each morning and often forgets to take it before going to work.  He is feeling well and denies any problems other than mild intermittent constipation.  He says that he tends to get this after eating more on weekends.  It occurs every two to 3 weeks often prompting him to take fiber supplements and occasionally ex-lax.  He is actually active with his wife says that he always uses condoms.  Allergies: No Known Drug Allergies  Physical Exam  General:  alert and well-nourished.   Mouth:  full dentures.pharynx pink and moist, no erythema, no exudates, and edentulous.   Lungs:  normal breath sounds, no crackles, and no wheezes.   Heart:  normal rate, regular rhythm, and no murmur.   Skin:  no rashes.   Psych:  normally interactive, good eye contact, not anxious appearing, and not depressed appearing.          Medication Adherence: 11/21/2009   Adherence  to medications reviewed with patient. Counseling to provide adequate adherence provided   Prevention For Positives: 11/21/2009   Safe sex practices discussed with patient. Condoms offered.                             Impression & Recommendations:  Problem # 1:  HIV DISEASE (ICD-042) His HIV infection remains under excellent control.  I will continue his current regimen. Diagnostics Reviewed:  CD4: 860 (10/30/2009)   CD4 %: 40 (06/22/2006) WBC: 8.5 (10/29/2009)   Hgb: 14.7 (10/29/2009)   HCT: 44.0 (10/29/2009)   Platelets: 224 (10/29/2009) HIV-1 RNA: <48 copies/mL (10/29/2009)   HBSAg: No (06/28/2006)  Problem # 2:  HYPERTENSION (ICD-401.9) I have asked him to keep a small supply of his hydrochlorothiazide with him when he is away from home in case he misses his morning dose.  If misses continue to be a problem I will switch him to a  nondiuretic regimen which he can take at night with his Atripla His updated medication list for this problem includes:    Hydrochlorothiazide 50 Mg Tabs (Hydrochlorothiazide) .Marland Kitchen... Take 1 tablet by mouth once a day  Orders: Est. Patient Level IV (40102)  Prior BP: 143/87 (11/21/2009)  Labs Reviewed: K+: 3.7 (10/29/2009) Creat: : 1.09 (10/29/2009)   Chol: 182 (10/29/2009)   HDL: 45 (10/29/2009)   LDL: 118 (10/29/2009)   TG: 94 (10/29/2009)  Problem # 3:  CONSTIPATION (ICD-564.00)  I suggested that he take daily fiber supplement along with more water. Orders: Est. Patient Level IV (72536)  Problem # 4:  ERECTILE DYSFUNCTION (ICD-302.72) I have agreed to refill his Viagra. His updated medication list for this problem includes:    Viagra 50 Mg Tabs (Sildenafil citrate) .Marland Kitchen... Take 1 tablet by mouth as directed  Orders: Est. Patient Level IV (64403)  Other Orders: Future Orders: T-CD4SP ([facility]) (CD4SP) ... 05/20/2010 T-HIV Viral Load [id number]) ... 05/20/2010 T-Comprehensive Metabolic Panel [phone]) ... 05/20/2010 T-CBC w/Diff ([id number]) ... 05/20/2010 T-RPR (Syphilis) [phone]) ... 05/20/2010 T-Lipid Profile [id number]) ... 05/20/2010  Patient Instructions: 1)  Please schedule a follow-up appointment in 6 months.  Prescriptions: VIAGRA 50 MG TABS (SILDENAFIL CITRATE) Take 1 tablet by mouth as directed  #30 x 1   Entered and Authorized by:   Cliffton Asters MD   Signed by:   Cliffton Asters MD on 11/21/2009   Method used:   Electronically to        [facility] [employer] [phone]* (retail)       [address] [place], [place]  [address]       Ph: [phone] or [phone]       Fax: [phone]   RxID:   [id number]

## 2010-06-05 NOTE — Assessment & Plan Note (Signed)
Summary: fukam   Primary Provider:  Cliffton Asters MD  CC:  follow-up visit, lab results, and c/o constipation and weight loss.  History of Present Illness: Charles Daniel is in for his routine visit.  He denies missing any doses of his Atripla since his last visit.  He says he does miss his morning hydrochlorothiazide one to two times per week when he forgets.  He is bothered with constipation since his colon cancer surgery 4 years ago.  Each week he may go several days without a bowel movement.  He will take Dulcolax and then the following day he had 7 to 8 bowel movements.  Otherwise he is doing well.  He had a normal colonoscopy in April of 2010 and is not scheduled for a repeat until 2013.  Preventive Screening-Counseling & Management  Alcohol-Tobacco     Alcohol drinks/day: 0     Smoking Status: quit     Smoking Cessation Counseling: 02/10/2005     Packs/Day: 0.25     Year Quit: 2007     Passive Smoke Exposure: yes  Caffeine-Diet-Exercise     Caffeine use/day: yes     Does Patient Exercise: no     Type of exercise: alot of waliking at his job     Times/week: 5  Hep-HIV-STD-Contraception     HIV Risk: no risk noted     HIV Risk Counseling: to avoid increased HIV risk  Safety-Violence-Falls     Seat Belt Use: yes      Sexual History:  currently monogamous.        Drug Use:  never.    Comments: pt. declined condoms   Updated Prior Medication List: ATRIPLA 600-200-300 MG TABS (EFAVIRENZ-EMTRICITAB-TENOFOVIR) Take 1 tablet by mouth once a day at bedtime HYDROCHLOROTHIAZIDE 50 MG TABS (HYDROCHLOROTHIAZIDE) Take 1 tablet by mouth once a day ASPIRIN 325 MG TABS (ASPIRIN) Take 1 tablet by mouth once a day VIAGRA 50 MG TABS (SILDENAFIL CITRATE) Take 1 tablet by mouth as directed  Current Allergies (reviewed today): No known allergies  Vital Signs:  Patient profile:   70 year old male Height:      66 inches (167.64 cm) Weight:      147.8 pounds (67.18 kg) BMI:     23.94 Temp:      98.0 degrees F (36.67 degrees C) oral Pulse rate:   67 / minute BP sitting:   160 / 86  (right arm)  Vitals Entered By: Wendall Mola CMA Duncan Dull) (April 22, 2010 8:40 AM) CC: follow-up visit, lab results, c/o constipation and weight loss Is Patient Diabetic? No Pain Assessment Patient in pain? no      Nutritional Status BMI of 19 -24 = normal Nutritional Status Detail appetite "good"  Have you ever been in a relationship where you felt threatened, hurt or afraid?No   Does patient need assistance? Functional Status Self care Ambulation Normal Comments no missed doses of Atripla, pt. has missed a few doses of B/P meds   Physical Exam  General:  alert and well-nourished.  he does have some lipoatrophy evident in his arms and legs.  He was worried that he lost weight since his last visit but he is actually up 2 pounds. Mouth:  full dentures.pharynx pink and moist, no erythema, no exudates, and edentulous.   Lungs:  normal breath sounds, no crackles, and no wheezes.   Heart:  normal rate, regular rhythm, and no murmur.   Abdomen:  soft, non-tender, normal bowel sounds, no distention, and  no masses.   Skin:  no rashes.   Psych:  normally interactive, good eye contact, not anxious appearing, and not depressed appearing.     Impression & Recommendations:  Problem # 1:  HIV DISEASE (ICD-042) His infection remains under excellent control.  I will continue his current regimen. Diagnostics Reviewed:  CD4: 890 (04/04/2010)   CD4 %: 40 (06/22/2006) WBC: 8.5 (10/29/2009)   Hgb: 14.7 (10/29/2009)   HCT: 44.0 (10/29/2009)   Platelets: 224 (10/29/2009) HIV-1 RNA: <20 copies/mL (04/03/2010)   HBSAg: No (06/28/2006)  Problem # 2:  HYPERTENSION (ICD-401.9) I have encouraged him to try not to miss a single dose of his medication.  I will see him back next month for a recheck of his blood pressure.  He may need a second medication. His updated medication list for this problem  includes:    Hydrochlorothiazide 50 Mg Tabs (Hydrochlorothiazide) .Marland Kitchen... Take 1 tablet by mouth once a day  BP today: 160/86 Prior BP: 143/87 (11/21/2009)  Labs Reviewed: K+: 5.2 (04/03/2010) Creat: : 0.96 (04/03/2010)   Chol: 165 (04/03/2010)   HDL: 46 (04/03/2010)   LDL: 108 (04/03/2010)   TG: 53 (04/03/2010)  Orders: Est. Patient Level IV (04540)  Problem # 3:  CONSTIPATION (ICD-564.00) I have suggested that he change from as needed Dulcolax to a daily fiber supplement. His updated medication list for this problem includes:    Fiber 0.52 Gm Caps (Psyllium) .Marland Kitchen... Take as directed  Orders: Est. Patient Level IV (98119)  Medications Added to Medication List This Visit: 1)  Fiber 0.52 Gm Caps (Psyllium) .... Take as directed  Patient Instructions: 1)  Please schedule a follow-up appointment in 6-8 weeks.          Medication Adherence: 04/22/2010   Adherence to medications reviewed with patient. Counseling to provide adequate adherence provided                                Appended Document: fukam   Immunizations Administered:  Influenza Vaccine # 1:    Vaccine Type: Fluvax Non-MCR    Site: left deltoid    Mfr: Novartis    Dose: 0.5 ml    Route: IM    Given by: Wendall Mola CMA ( AAMA)    Exp. Date: 08/03/2010    Lot #: [id number]    VIS given: 11/26/09 version given April 22, 2010.  Flu Vaccine Consent Questions:    Do you have a history of severe allergic reactions to this vaccine? no    Any prior history of allergic reactions to egg and/or gelatin? no    Do you have a sensitivity to the preservative Thimersol? no    Do you have a past history of Guillan-Barre Syndrome? no    Do you currently have an acute febrile illness? no    Have you ever had a severe reaction to latex? no    Vaccine information given and explained to patient? yes

## 2010-07-15 LAB — T-HELPER CELL (CD4) - (RCID CLINIC ONLY)
CD4 % Helper T Cell: 38 % (ref 33–55)
CD4 T Cell Abs: 890 uL (ref 400–2700)

## 2010-08-10 LAB — T-HELPER CELL (CD4) - (RCID CLINIC ONLY)
CD4 % Helper T Cell: 42 % (ref 33–55)
CD4 T Cell Abs: 780 uL (ref 400–2700)

## 2010-09-16 ENCOUNTER — Other Ambulatory Visit: Payer: Self-pay | Admitting: Internal Medicine

## 2010-09-16 ENCOUNTER — Other Ambulatory Visit (INDEPENDENT_AMBULATORY_CARE_PROVIDER_SITE_OTHER): Payer: BC Managed Care – PPO

## 2010-09-16 DIAGNOSIS — B20 Human immunodeficiency virus [HIV] disease: Secondary | ICD-10-CM

## 2010-09-17 LAB — CBC WITH DIFFERENTIAL/PLATELET
Basophils Relative: 1 % (ref 0–1)
Eosinophils Absolute: 0.2 10*3/uL (ref 0.0–0.7)
Lymphs Abs: 1.7 10*3/uL (ref 0.7–4.0)
MCH: 27.5 pg (ref 26.0–34.0)
MCHC: 32.5 g/dL (ref 30.0–36.0)
Neutro Abs: 4.6 10*3/uL (ref 1.7–7.7)
Neutrophils Relative %: 67 % (ref 43–77)
Platelets: 201 10*3/uL (ref 150–400)
RBC: 5.01 MIL/uL (ref 4.22–5.81)

## 2010-09-17 LAB — COMPREHENSIVE METABOLIC PANEL
ALT: 13 U/L (ref 0–53)
AST: 13 U/L (ref 0–37)
Alkaline Phosphatase: 70 U/L (ref 39–117)
Sodium: 143 mEq/L (ref 135–145)
Total Bilirubin: 0.3 mg/dL (ref 0.3–1.2)
Total Protein: 6.9 g/dL (ref 6.0–8.3)

## 2010-09-19 NOTE — Letter (Signed)
February 02, 2006     Hulan Szumski  [address]  [place]  [address]   RE:  BRENTON, JOINES  MRN:  [id number]  /  DOB:  18-May-1940   Dear Alden Server:   It is my pleasure to have treated you recently as a new patient in my  office.  I appreciate your confidence and the opportunity to participate in  your care.   Since I do have a busy inpatient endoscopy schedule and office schedule, my  office hours vary weekly.  I am, however, available for emergency calls  every day through my office.  If I cannot promptly meet an urgent office  appointment, another one of our gastroenterologists will be able to assist  you.   My well-trained staff are prepared to help you at all times.  For  emergencies after office hours, a physician from our gastroenterology  section is always available through my 24-hour answering service.   While you are under my care, I encourage discussion of your questions and  concerns, and I will be happy to return your calls as soon as I am  available.   Once again, I welcome you as a new patient and I look forward to a happy and  healthy relationship.   Sincerely,     Barbette Hair. Arlyce Dice, MD,FACG   RDK/MedQ DD:  02/02/2006 DT:  02/03/2006 Job #:  [id number]

## 2010-09-19 NOTE — Assessment & Plan Note (Signed)
[facility]                           GASTROENTEROLOGY OFFICE NOTE   BENTON, TOOKER                        MRN:          [id number]  DATE:02/18/2006                            DOB:          1941/01/19    PROBLEM:  Malignant polyp.   Mr. Arroyave has returned following colonoscopy.  In the sigmoid colon at  approximately 15 cm from the anus he had a sessile 3-cm polyp.  Biopsies  demonstrated invasive adenocarcinoma.  A benign polyp was removed from the  more proximal sigmoid colon.  He has scattered diverticula in the cecum and  ascending colon.   Mr. Laneve has had weight loss and he is unsure why this is so.  I do not  believe that the polyp would explain this.   EXAMINATION:  Pulse 84, blood pressure 126/70, weight 152.   IMPRESSION:  1. Malignant sigmoid colon polyp.  2. Human immunodeficiency virus positivity.  3. History of hypertension and cerebrovascular accident.   RECOMMENDATIONS:  CT for staging, and surgical consult.       Barbette Hair. Arlyce Dice, MD,FACG      RDK/MedQ  DD:  02/18/2006  DT:  02/18/2006  Job #:  [id number]   cc:   Cliffton Asters, M.D.  Send copy of colonoscopy report, pathology report and office note. Dr. Darnell Level

## 2010-09-19 NOTE — Discharge Summary (Signed)
Charles Daniel. [facility]  Patient:    Charles Daniel, Charles Daniel Visit Number: [id number] MRN: [id number]          Service Type: MED Location: [phone] Attending Physician:  Farley Ly Dictated by:   Narda Amber, M.D. Admit Date:  11/16/2001 Discharge Date: 11/18/2001   CC:         Outpatient Clinic  Dr. Orvan Falconer   Discharge Summary  DISCHARGE DIAGNOSES: 1. Right middle cerebral artery infarct. 2. Human immunodeficiency virus positive. 3. Hypertension. 4. 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  DISCHARGE MEDICATIONS: 1. Aspirin 325 mg p.o. q.d. 2. Hydrochlorothiazide 25 mg p.o. q.d. 3. Combivir 300/150 one tablet by mouth p.o. q.d. 4. Sustiva 200 mg three tablets p.o. q.h.s.  CONDITION ON DISCHARGE:  The patient was discharged in good condition.  FOLLOW-UP:  He was instructed to follow up in the outpatient clinic and we will call him at home at [phone] on Monday to tell him the exact date and hour of the appointment with Dr. Narda Amber in the outpatient clinic.  He is going to follow up with his continuity doctor, Dr. Orvan Falconer, also in the outpatient clinic.  PROCEDURES DURING ADMISSION: 1. CT of the head without contrast was obtained on November 16, 2001.  The    impression was suspect acute right middle cerebral artery distribution    infarct, no evidence of intracranial hemorrhage. 2. November 16, 2001, a chest x-ray was obtained with no active disease. 3. November 16, 2001, MRI/MRA was obtained. The MRI showed acute right middle    cerebral artery infarct affecting the insular cortex, posterior temporal    cortex and posterior frontal cortex all on the right along with    underlying subcortical white matter.  Mild atrophy and small vessel    disease, no acute intracranial hemorrhage.  The MRA showed occlusion of    a single middle cerebral artery branch distal to the trifurcation on the    right accounting for  the acute infarctions, long segment disease distal    right vertebral artery. 4. 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  The ultrasound appearance is not entirely specific and    the further evaluation ___________ clinic CT or MRI should be performed    and mild  uniform goal data wall thickening likely related to the    patients hepatitis history. 5. November 16, 2001, a 2-D echo of the heart was obtained which showed overall    left ventricular ejection fraction of 55 to 65%, left ventricular wall    thickness was moderately increased. The aortic valve was mildly calcified.    The left ventricle was mildly dilated.  There was no echocardiographic    evidence for cardiac source of embolism. 6. On November 16, 2001, EKG was obtained which showed normal sinus rhythm    with type I AV block. 7. On November 16, 2001, Doppler of the carotid arteries was obtained which    showed mild diffuse atherogenous plaque at the bifurcation of the proximal   internal carotid artery bilaterally.  No internal carotid artery stenosis    and _________ artery with antegrade flow.  BRIEF ADMISSION HISTORY AND PHYSICAL:  This is a 70 year old African American male with a past medical history significant for HIV diagnosed in 1995 followed by Dr. Orvan Falconer with the last  CD4 count of 1100, viral load less than 60 with results hepatitis B, history of secondary syphilis and hypertension. He came in because for the last two days he has been feeling weak and this morning as he stood up at work, his knees buckled and he fell to the floor. He did not hit his head, did not pass any urine or did not pass any stool.  He did not bite his tongue.  He sat for approximately one and a half minutes and pulled himself up to a chair and was able to call for assistance.  He also noticed that he was drooling from the right side of  his mouth and his speech was slurred.  The patient thought that these symptoms were resolving.  He was denying any chest pain, shortness of breath, no headaches, no fever, no chills, no palpitations.  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  HOME MEDICATIONS:  Combivir, Sustiva and Aldactazide 50/50 q.d.  ALLERGIES:  No known drug allergies.  FAMILY HISTORY:  His father died of cancer. His mother died of cancer.  He has a sister who died of cancer and daughter also of breast cancer.  SOCIAL HISTORY:  He does not smoke, he does not drink alcohol.  He lives with his wife and works for the city of [place], [place].  ADMISSION PHYSICAL EXAMINATION:  VITAL SIGNS:  Temperature 97.7, blood pressure 151/93, heart rate 67, respiratory rate 20, saturating 98% on room air.  HEENT:  Normocephalic and atraumatic.  PERRLA.  No erythema in the pharynx.  NECK:  No JVD, no carotid bruits.  LUNGS:  Clear to auscultation bilaterally.  CARDIOVASCULAR:  Regular rate and rhythm, no murmurs, no gallops.  S1 and S2 same intensity.  ABDOMEN:  Soft, nontender and nondistended.  Bowel sounds were present.  He has positive hepatomegaly.  EXTREMITIES:  Lower extremities without edema.  Pulses were present.  NEUROLOGICAL:  Cranial nerves III-XII tests done and intact.  Strength 5/5 throughout.  Sensation was intact.  Alert and oriented x3.  Gait was okay.  He had left facial weakness, drooling from the right side of the mouth and he was displaying dysarthria.  LABS ON ADMISSION:  The hemoglobin was 13.7, hematocrit 42.8. PTT 32, PT 14.2, INR 1.1. Sodium 138, potassium 3.6, chloride 105, BUN 10, creatinine 1, CO2  27.  CK was 547, CK-MB 2, troponin 0.01. Repeat, CK was 323, CK-MB 1.7, troponin less than 0.01.  HOSPITAL COURSE:  #1 - ACUTE RIGHT MIDDLE  CEREBRAL ARTERY STROKE:  The patient was displaying minimal neurological deficits with left facial weakness and mild dysarthria. He was not showing any motor deficits.  He was aware of his disease and accepted the treatment with aspirin.  We evaluated his carotids for possible significant carotid stenosis which have indicated the need for surgical intervention but there was no significant carotid stenosis found. We obtained MRI of the brain which showed thrombotic stroke on the right middle cerebral artery distribution.  We obtained a 2-D echo of the heart which was negative for possible source of cardiac emboli.  The patient was started on aspirin. The physical therapy and occupational therapy evaluated the patient and he was instructed to follow up and continue this regimen.  To evaluate the possible risk factors, we obtained a fasting lipid panel which showed a cholesterol of 143, triglycerides 116, HDL 35, LDL 85.  We will continue to treat his hypertension with a small dose of hydrochlorothiazide as we do not need tight control of hypertension in the setting of an acute stroke.  #2 - HUMAN IMMUNODEFICIENCY VIRUS STATUS:  We continued Combivir and Sustiva during the course of admission.  He will continue to follow up with Dr. Orvan Falconer in the clinic.  #3 - HEPATOMEGALY:  This was appreciated on physical examination.  An abdominal ultrasound was obtained which raised the possibility of two masses in the liver. 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  If further should be considered for hepatic masses, a CT or MRI as an outpatient will be considered.  LABS AT DISCHARGE:  Lipase 18, homocystine 13 absolutely  normal.  Sodium 139, potassium 3.1, chloride 103, bicarb 31, BUN 10, creatinine 1.1, glucose 120. Total bilirubin 0.6, alkaline phosphatase 56, AST 18, ALT 16, total protein 6.7, albumin 3.2, calcium 9.1.   Dictated by:   Narda Amber, M.D.  Attending Physician:  Farley Ly DD:  11/18/01 TD:  11/24/01 Job: [id number] [phone]

## 2010-09-19 NOTE — Op Note (Signed)
Charles Daniel, Charles Daniel               ACCOUNT NO.:  [id number]   MEDICAL RECORD NO.:  [id number]          PATIENT TYPE:  INP   LOCATION:  5740                         FACILITY:  [facility]   PHYSICIAN:  Velora Heckler, MD      DATE OF BIRTH:  April 26, 1941   DATE OF PROCEDURE:  03/09/2006  DATE OF DISCHARGE:                                 OPERATIVE REPORT   PREOPERATIVE DIAGNOSIS:  Colorectal carcinoma.   POSTOPERATIVE DIAGNOSIS:  Colorectal carcinoma.   PROCEDURE:  Low anterior resection of rectosigmoid colon.   SURGEON:  Velora Heckler, M.D., FACS   ASSISTANT:  Anselm Pancoast. Zachery Dakins, M.D., FACS   ANESTHESIA:  General per Dr. Sharee Holster   ESTIMATED BLOOD LOSS:  Minimal.   PREPARATION:  Betadine.   COMPLICATIONS:  None.   INDICATIONS:  The patient is a 70 year old black male from [place], [place].  He is referred by Dr. Cliffton Asters and Dr. Melvia Heaps for  newly diagnosed adenocarcinoma of the distal sigmoid colon.  The patient had  had intermittent bleeding per rectum for approximately one year.  He  underwent colonoscopy by Dr. Melvia Heaps in October 2007.  He was found to  have a 3 cm sessile mass at 15 cm from the anus.  Biopsies confirmed  adenocarcinoma arising in a tubulovillous adenoma.  The patient now comes to  operation for resection.   BODY OF REPORT:  The procedure was done in OR #16 at [facility].  The patient was brought to the operating room and placed in a supine  position on the operating room table.  Following the administration of  general anesthesia, the patient is placed in low lithotomy and prepped and  draped in the usual strict aseptic fashion.  After ascertaining that an  adequate level of anesthesia been obtained, a low midline incision was made  with a #10 blade.  Dissection was carried through the subcutaneous tissues.  The fascia is incised in the midline.  The peritoneal cavity is entered  cautiously.  The abdomen was  explored.  The mass was palpable at the  peritoneal reflection.  The tumor was actually just above the peritoneal  reflection in the distal sigmoid colon at its junction with the proximal  rectum.  No significant lymphadenopathy is palpable.  The small bowel was  normal.  The omentum was normal.  The remainder of the colon was grossly  normal.  The liver is normal to palpation.  Balfour retractors were placed  for exposure.  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  The sigmoid  colon was mobilized from its lateral peritoneal attachments and adhesions to  the retroperitoneum due to diverticular change.  The sigmoid colon was  completely mobilized.  A point at the proximal sigmoid colon was selected  and transected with a GIA stapler.  The sigmoid mesentery was then taken  down using the LigaSure device for hemostasis.  Dissection was carried into  the pelvis.  The pelvic peritoneum was incised with the electrocautery.  A  suture was used to mark the location of the  distal margin of the tumor.  Dissection was carried approximately 3-4 cm past this point.  The LigaSure  was used to divide mesenteric vessels.  Dissection was carried into the low  pelvis.  Mesenteric vessels were ligated in continuity with 2-0 silk ties  and divided.  Dissection was carried up and onto the posterior bowel wall.  Using an Ethicon contour stapler, the rectum is transected at approximately  10 cm from the anus.  This provides about a 3.5 distal margin from the tumor  confirmed by Dr. Jimmy Picket in pathology.  The entire specimen was  submitted to pathology for review.  Good hemostasis was noted in the pelvis.   Next, the proximal sigmoid colon staple line is excised.  The mesentery is  cleared.  A 2-0 Prolene pursestring suture is placed circumferentially.  A  29 mm Ethicon EEA stapler is selected.  The anvil was placed into the distal  colon and pursestring suture tied securely.  Dr. Thornton Dales did  rigid  sigmoidoscopy.  He visualized the staple line.  The stapler was then  inserted and trocar advanced through the mid point of the staple line.  The  stapler was reassembled with the anvil and closed.  The stapler was then  fired and withdrawn.  Two complete donuts of tissue were identified on the  back table.  Rigid sigmoidoscopy was again performed.  Air was insufflated  into the rectum and sigmoid colon.  With tense distension, there was no  visible sign of air leak at the level of the anastomosis while under  irrigation.  The scope was withdrawn.  Irrigation is evacuated from the  pelvis.  The abdomen is irrigated with warm saline which was evacuated.  Good hemostasis was noted.  The small bowel was returned to its normal  position and the Balfour retractor was removed completely.  Omentum is used  to cover the small bowel.  The midline wound is opened just slightly  cephalad of the umbilicus in order to open a ventral hernia.  It contains of  preperitoneal fat which is excised and submitted as a specimen.   The fascia was then closed with running #1 PDS suture.  Good hemostasis was  noted.  The subcutaneous tissues are irrigated.  The skin was closed with  stainless steel staples.  The wound is washed and dried and sterile  dressings were applied.  The patient is awakened from anesthesia and brought  to the recovery room in stable condition.  The patient tolerated the  procedure well.      Velora Heckler, MD  Electronically Signed     TMG/MEDQ  D:  03/09/2006  T:  03/09/2006  Job:  [id number]   cc:   Barbette Hair. Arlyce Dice, MD,FACG  Cliffton Asters, M.D.

## 2010-09-19 NOTE — Discharge Summary (Signed)
NAMEJANIEL, Charles Daniel               ACCOUNT NO.:  [id number]   MEDICAL RECORD NO.:  [id number]          PATIENT TYPE:  INP   LOCATION:  5740                         FACILITY:  [facility]   PHYSICIAN:  Velora Heckler, MD      DATE OF BIRTH:  09-Feb-1941   DATE OF ADMISSION:  03/09/2006  DATE OF DISCHARGE:  03/14/2006                               DISCHARGE SUMMARY   REASON FOR ADMISSION:  Colorectal carcinoma.   HISTORY OF PRESENT ILLNESS:  The patient is a 70 year old black male  from [place], [place], admitted for newly diagnosed  adenocarcinoma of the distal sigmoid colon.  The patient has had  intermittent bleeding per rectum.  He underwent colonoscopy by Dr.  Melvia Heaps.  He was found to have a 3 cm sessile polyp in the distal  sigmoid colon.  Biopsy showed invasive adenocarcinoma.   HOSPITAL COURSE:  The patient was admitted on the surgical service and  taken directly to the operating room where he underwent low anterior  resection of the rectosigmoid colon.  His postoperative course was  straight forward.  The patient started a clear liquid diet on the third  postoperative day.  He became ambulatory.  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  The  patient's diet was advanced.  He was prepared for discharge home on the  5th postoperative day.   DISCHARGE PLANNING:  The patient is discharged home March 14, 2006,  in good condition, tolerating a regular diet, and ambulating  independently.  The patient will be seen back in my office at [facility] in follow up in two weeks.   DISCHARGE MEDICATIONS:  Vicodin as needed for pain and home medications  as per usual.   FINAL PATHOLOGIC DIAGNOSIS:  Colonic adenocarcinoma invading muscularis  propria and arising in a tubular villous adenoma.  Margins were not  involved.  There were 13 benign lymph nodes were identified, [id number].   CONDITION ON DISCHARGE:  Good.      Velora Heckler, MD  Electronically Signed     TMG/MEDQ  D:  05/13/2006  T:  05/13/2006  Job:  [id number]   cc:   Cliffton Asters, M.D.  Barbette Hair. Arlyce Dice, MD,FACG

## 2010-09-19 NOTE — Assessment & Plan Note (Signed)
[facility]                         GASTROENTEROLOGY OFFICE NOTE   [name], [name]                        MRN:          [id number]  DATE:08/18/2006                            DOB:          July 30, 1940    PROBLEM:  1. Constipation.  2. Rectal bleeding.   Charles Daniel has returned for evaluation of the above.  Since his surgery,  he has been suffering from mild constipation.  Several days ago he  developed abdominal distention and some discomfort.  He took several ExContractor and has had problems with his bowels with a past history of small  amounts of bright red blood as well.  In November 2007, he underwent a  colonic resection for sigmoid carcinoma.  He has had no further rectal  bleeding over the last 2 days.   PHYSICAL EXAMINATION:  VITAL SIGNS: Pulse 68, blood pressure 124/76,  weight 184.  HEENT: EOMI. PERRLA. Sclerae are anicteric.  Conjunctivae are pink.  NECK:  Supple without thyromegaly, adenopathy or carotid bruits.  CHEST:  Clear to auscultation and percussion without adventitious  sounds.  CARDIAC:  Regular rhythm; normal S1 S2.  There are no murmurs, gallops  or rubs.  ABDOMEN:  Abdomen is distended and tympanitic.  Bowel sounds are  positive. There are no abdominal masses or organomegaly.  EXTREMITIES:  Full range of motion.  No cyanosis, clubbing or edema.  RECTAL:  There are no masses.  Stool is Hemoccult negative.   IMPRESSION:  1. Obstipation and constipation.  This may be functional, though a      colonic stricture ought to be ruled out.  2. Limited rectal bleeding, likely secondary to hemorrhoids.   RECOMMENDATION:  1. KUB.  If there appears to be any degree of partial colonic      obstruction, I will set him up for a sigmoidoscopy with      consideration of balloon dilatation of the stricture.  2. MiraLax 17 g daily as needed for constipation.     Barbette Hair. Arlyce Dice, MD,FACG  Electronically Signed    RDK/MedQ  DD:  08/18/2006  DT: 08/18/2006  Job #: [id number]   cc:   Cliffton Asters, M.D.  Velora Heckler, MD

## 2010-09-19 NOTE — Assessment & Plan Note (Signed)
[facility]                           GASTROENTEROLOGY OFFICE NOTE   Daniel, CICHY                        MRN:          [id number]  DATE:02/02/2006                            DOB:          Apr 18, 1941    REASON FOR CONSULTATION:  Heme positive stool.   Charles Daniel is a pleasant 70 year old African-American male referred through  the courtesy of Dr. Orvan Falconer for evaluation.  On routine testing he was  noted to be hemoccult positive.  He has a history of limited rectal  bleeding, consistent of dark and sometimes bright red blood mixed with the  stools.  He denies abdominal or rectal pain.  There has been no change in  his bowel habits.   PAST MEDICAL HISTORY:  Is pertinent for HIV positivity.  He has been stable  for several years.  He has hypertension and a history of a CVA.  Please see  history of present illness.  [name] note that when he eats multiple  bananas (up to 12 a day) he does have some straining of the stool.   FAMILY HISTORY:  Noncontributory.   MEDICATIONS:  HCTZ, Atriplaand baby aspirin.   He has no known allergy.   He quit smoking, he does not drink.  He is married and retired.   REVIEW OF SYSTEMS:  Positive for occasional night sweats.   PHYSICAL EXAMINATION:  He is a healthy appearing male.  Pulse 86.  Blood pressure 138/50. Weight 152.  HEENT: EOMI. PERRLA. Sclerae are anicteric.  Conjunctivae are pink.  NECK:  Supple without thyromegaly, adenopathy or carotid bruits.  CHEST:  Clear to auscultation and percussion without adventitious sounds.  CARDIAC:  Regular rhythm; normal S1 S2.  There are no murmurs, gallops or  rubs.  ABDOMEN:  He has a 2 x 2 cm subcutaneous movable slightly tender nodule just  to the right of the umbilicus, consistent with a lipoma.  Bowel sounds are  normoactive.  Abdomen is soft, non-tender and non-distended.  There are no  abdominal masses, tenderness, splenic enlargement or  hepatomegaly.  EXTREMITIES:  Full range of motion.  No cyanosis, clubbing or edema.  RECTAL:  Deferred.   IMPRESSION:  1. History of limited rectal bleeding.  This may be due to hemorrhoidal      bleeding though a more __________ bleeding source ought to be ruled      out, including polyps, AVMs and  neoplasm.  2. HIV positivity.  3. Hypertension.   RECOMMENDATION:  A colonoscopy.       Charles Daniel. Charles Dice, MD,FACG      RDK/MedQ  DD:  02/02/2006  DT:  02/03/2006  Job #:  [id number]   cc:   Cliffton Asters, M.D.

## 2010-09-19 NOTE — Letter (Signed)
February 02, 2006    Cliffton Asters, M.D.  [address] [place], [place] [address]    RE:  STONEWALL, DOSS  MRN:  [id number]  /  DOB:  10/07/1940   Dear Dr. Orvan Falconer   Upon your kind referral, I had the pleasure of evaluating your patient and I  am pleased to offer my findings.  I saw Charles Daniel in the office today.  Enclosed is a copy of my progress note that details my findings and  recommendations.   Thank you for the opportunity to participate in your patient's care.    Sincerely,      Barbette Hair. Arlyce Dice, MD, [facility]   RDK/MedQ  DD:  02/02/2006  DT:  02/03/2006  Job #:  [id number]

## 2010-09-30 ENCOUNTER — Encounter: Payer: Self-pay | Admitting: Internal Medicine

## 2010-09-30 ENCOUNTER — Ambulatory Visit (INDEPENDENT_AMBULATORY_CARE_PROVIDER_SITE_OTHER): Payer: BC Managed Care – PPO | Admitting: Internal Medicine

## 2010-09-30 VITALS — BP 161/91 | HR 70 | Temp 98.1°F | Ht 66.0 in | Wt 146.0 lb

## 2010-09-30 DIAGNOSIS — I10 Essential (primary) hypertension: Secondary | ICD-10-CM

## 2010-09-30 DIAGNOSIS — B20 Human immunodeficiency virus [HIV] disease: Secondary | ICD-10-CM

## 2010-09-30 DIAGNOSIS — Z23 Encounter for immunization: Secondary | ICD-10-CM

## 2010-09-30 NOTE — Progress Notes (Signed)
Addended by: Jennet Maduro D on: 09/30/2010 11:04 AM   Modules accepted: Orders

## 2010-09-30 NOTE — Assessment & Plan Note (Signed)
His infection remains under excellent control. I will continue his current regimen. 

## 2010-09-30 NOTE — Assessment & Plan Note (Signed)
He continues to have difficulty adhering to his hydrochlorothiazide. He denies having any problems tolerating it but simply forgets. I've encouraged him to set up a system reminders and try not to miss a single dose.

## 2010-09-30 NOTE — Progress Notes (Signed)
  Subjective:    Patient ID: Charles Daniel, male    DOB: October 29, 1940, 71 y.o.   MRN: [id number]  HPI Charles Daniel is in for his routine visit. He recalls missing only one dose of his HIV medications since his last visit. However he continues to forget to take his blood pressure medication and estimates that he has missed 2 doses in the last week.    Review of Systems     Objective:   Physical Exam  Constitutional: He appears well-developed and well-nourished.  HENT:  Mouth/Throat: Oropharynx is clear and moist. No oropharyngeal exudate.  Cardiovascular: Normal rate, regular rhythm and normal heart sounds.   No murmur heard. Pulmonary/Chest: Breath sounds normal. He has no wheezes.  Psychiatric: He has a normal mood and affect.          Assessment & Plan:

## 2010-10-09 ENCOUNTER — Telehealth: Payer: Self-pay | Admitting: *Deleted

## 2010-10-09 NOTE — Telephone Encounter (Signed)
Rec'd a message on the answering machine from pt asking [place] to call him. I did & was unable to leave a message at that number. Will wait for him to call again

## 2010-10-10 ENCOUNTER — Telehealth: Payer: Self-pay | Admitting: *Deleted

## 2010-10-10 NOTE — Telephone Encounter (Signed)
Tried to reach him again. Will help when I find out what he wants done

## 2010-10-10 NOTE — Telephone Encounter (Signed)
He is c/o back pain. States the md was going to "do something" told him I would send the md a message & call him when I get a response. He then said he is off next Friday & wants an appt. Transferred to front to make an appt

## 2010-10-10 NOTE — Telephone Encounter (Signed)
I see no mention of back pain in recent notes or on his problem list so he will need an appointment. Please let him know I do not have any Friday clinics.

## 2010-10-10 NOTE — Telephone Encounter (Signed)
I called him back to let him know his md would not be seeing him next Friday. States he is aware & is willing to see anyone. States it is his hip, not his back that is hurting

## 2010-10-17 ENCOUNTER — Ambulatory Visit (INDEPENDENT_AMBULATORY_CARE_PROVIDER_SITE_OTHER): Payer: BC Managed Care – PPO | Admitting: Adult Health

## 2010-10-17 ENCOUNTER — Encounter: Payer: Self-pay | Admitting: Adult Health

## 2010-10-17 ENCOUNTER — Ambulatory Visit (HOSPITAL_COMMUNITY)
Admission: RE | Admit: 2010-10-17 | Discharge: 2010-10-17 | Disposition: A | Discharge status: Home / Self Care | Payer: BC Managed Care – PPO | Source: Ambulatory Visit | Attending: Adult Health | Admitting: Adult Health

## 2010-10-17 VITALS — BP 137/78 | HR 73 | Temp 97.8°F | Ht 66.0 in | Wt 144.0 lb

## 2010-10-17 DIAGNOSIS — M47817 Spondylosis without myelopathy or radiculopathy, lumbosacral region: Secondary | ICD-10-CM | POA: Insufficient documentation

## 2010-10-17 DIAGNOSIS — M169 Osteoarthritis of hip, unspecified: Secondary | ICD-10-CM | POA: Insufficient documentation

## 2010-10-17 DIAGNOSIS — M25559 Pain in unspecified hip: Secondary | ICD-10-CM

## 2010-10-17 DIAGNOSIS — M25551 Pain in right hip: Secondary | ICD-10-CM

## 2010-10-17 DIAGNOSIS — M1610 Unilateral primary osteoarthritis, unspecified hip: Secondary | ICD-10-CM | POA: Insufficient documentation

## 2010-10-17 MED ORDER — HYDROCODONE-IBUPROFEN 5-200 MG PO TABS: ORAL_TABLET | ORAL | Status: DC

## 2010-10-17 NOTE — Progress Notes (Signed)
  Subjective:    Patient ID: Charles Daniel, male    DOB: [birth year], 70 y.o.   MRN: [id number]  HPI Presents to clinic today with a 2 to three-week history of right hip pain that increases progressively with full weight-bearing ambulation. Denies any trauma or previous problems with the hip. No past history of osteoarthritis or AVN. Current antiretroviral regimen includes Atripla.   Review of Systems  Constitutional: Negative.   Musculoskeletal: Positive for arthralgias and gait problem. Negative for myalgias, back pain and joint swelling.  Neurological: Negative.   Hematological: Negative.   Psychiatric/Behavioral: Negative.        Objective:   Physical Exam  Constitutional: He is oriented to person, place, and time. He appears well-developed and well-nourished. No distress.  Musculoskeletal: Normal range of motion. He exhibits tenderness. He exhibits no edema.       Point tenderness noted to the right greater trochanter. Full abduction. Abduction, flexion, extension, and hyperextension noted to both hips.  Neurological: He is alert and oriented to person, place, and time. No cranial nerve deficit. He exhibits normal muscle tone. Coordination normal.  Skin: Skin is warm and dry.  Psychiatric: He has a normal mood and affect. His behavior is normal. Judgment and thought content normal.          Assessment & Plan:  Right Hip Pain. Need to evaluate whether this is osteoarthritis or AVN. Will obtain x-rays of right hip and begin a regimen that includes hydrocodone plus ibuprofen 5/200 one by mouth q. 8H when necessary along with 400 mg of additional ibuprofen. Instructions on how to take the medication were provided for him. The last he return in 2 weeks to determine whether or not. He has received pain relief and to determine whether or not any further diagnostics or referral. Should be necessary.  He verbally acknowledged all information that was provided to him and agreed with plan of  care.

## 2010-10-31 ENCOUNTER — Encounter: Payer: Self-pay | Admitting: Adult Health

## 2010-10-31 ENCOUNTER — Ambulatory Visit (INDEPENDENT_AMBULATORY_CARE_PROVIDER_SITE_OTHER): Payer: BC Managed Care – PPO | Admitting: Adult Health

## 2010-10-31 DIAGNOSIS — M1631 Unilateral osteoarthritis resulting from hip dysplasia, right hip: Secondary | ICD-10-CM

## 2010-10-31 DIAGNOSIS — M167 Other unilateral secondary osteoarthritis of hip: Secondary | ICD-10-CM

## 2010-10-31 DIAGNOSIS — M25559 Pain in unspecified hip: Secondary | ICD-10-CM

## 2010-10-31 DIAGNOSIS — M25551 Pain in right hip: Secondary | ICD-10-CM

## 2010-10-31 NOTE — Progress Notes (Signed)
  Subjective:    Patient ID: Charles Daniel, male    DOB: 1941-01-18, 70 y.o.   MRN: [id number]  HPI Charles Daniel returns to clinic today to review his x-rays, which were ordered on 10/17/2010. He states his right hip pain is a little better with the medications be provided for him, but still has some pain from time to time. He has no new complaints as of this visit.   Review of Systems     Objective:   Physical Exam A review of his hip. X-rays shows bilateral osteal arthritis both determined to be mild based upon. Basic radiographic findings.       Assessment & Plan:  1. Osteoarthritis of the hips. Given his current age, and the length of time, the. He has been on medications, it still would be prudent to obtain an MRI of his right hip to rule out AVN. We will place, an order today, but ask that he continue our current treatment plan as this has been providing him some relief. He verbally acknowledged this and agreed with plan of care. We will ask that he return to clinic in 2 weeks for followup on his MRI results.

## 2010-11-11 ENCOUNTER — Ambulatory Visit (HOSPITAL_COMMUNITY)
Admission: RE | Admit: 2010-11-11 | Discharge: 2010-11-11 | Disposition: A | Discharge status: Home / Self Care | Payer: BC Managed Care – PPO | Source: Ambulatory Visit | Attending: Adult Health | Admitting: Adult Health

## 2010-11-11 DIAGNOSIS — M25551 Pain in right hip: Secondary | ICD-10-CM

## 2010-11-11 DIAGNOSIS — M51379 Other intervertebral disc degeneration, lumbosacral region without mention of lumbar back pain or lower extremity pain: Secondary | ICD-10-CM | POA: Insufficient documentation

## 2010-11-11 DIAGNOSIS — M5137 Other intervertebral disc degeneration, lumbosacral region: Secondary | ICD-10-CM | POA: Insufficient documentation

## 2010-11-11 DIAGNOSIS — Z01812 Encounter for preprocedural laboratory examination: Secondary | ICD-10-CM | POA: Insufficient documentation

## 2010-11-11 DIAGNOSIS — M25559 Pain in unspecified hip: Secondary | ICD-10-CM | POA: Insufficient documentation

## 2010-11-11 DIAGNOSIS — N433 Hydrocele, unspecified: Secondary | ICD-10-CM | POA: Insufficient documentation

## 2010-11-11 LAB — COMPREHENSIVE METABOLIC PANEL
Albumin: 3.7 g/dL (ref 3.5–5.2)
Alkaline Phosphatase: 74 U/L (ref 39–117)
BUN: 10 mg/dL (ref 6–23)
Calcium: 8.9 mg/dL (ref 8.4–10.5)
Creatinine, Ser: 1.03 mg/dL (ref 0.50–1.35)
GFR calc Af Amer: >60 mL/min (ref >60)
Potassium: 3.1 mEq/L — ABNORMAL LOW (ref 3.5–5.1)
Total Protein: 7.2 g/dL (ref 6.0–8.3)

## 2010-11-11 MED ORDER — GADOBENATE DIMEGLUMINE 529 MG/ML IV SOLN
13.0000 mL | Freq: Once | INTRAVENOUS | Status: AC
  Administered 2010-11-11: 13 mL via INTRAVENOUS

## 2010-11-14 ENCOUNTER — Ambulatory Visit (INDEPENDENT_AMBULATORY_CARE_PROVIDER_SITE_OTHER): Payer: BC Managed Care – PPO | Admitting: Adult Health

## 2010-11-14 ENCOUNTER — Encounter: Payer: Self-pay | Admitting: Adult Health

## 2010-11-14 DIAGNOSIS — IMO0002 Reserved for concepts with insufficient information to code with codable children: Secondary | ICD-10-CM

## 2010-11-14 DIAGNOSIS — N433 Hydrocele, unspecified: Secondary | ICD-10-CM

## 2010-11-14 MED ORDER — MELOXICAM 7.5 MG PO TABS: 7.5000 mg | ORAL_TABLET | Freq: Every day | ORAL | Status: DC

## 2010-11-18 ENCOUNTER — Ambulatory Visit (HOSPITAL_COMMUNITY)
Admission: RE | Admit: 2010-11-18 | Discharge: 2010-11-18 | Disposition: A | Discharge status: Home / Self Care | Payer: BC Managed Care – PPO | Source: Ambulatory Visit | Attending: Adult Health | Admitting: Adult Health

## 2010-11-18 DIAGNOSIS — N508 Other specified disorders of male genital organs: Secondary | ICD-10-CM | POA: Insufficient documentation

## 2010-11-18 DIAGNOSIS — N433 Hydrocele, unspecified: Secondary | ICD-10-CM

## 2010-11-19 NOTE — Progress Notes (Signed)
  Subjective:    Patient ID: Charles Daniel, male    DOB: Nov 25, 1940, 70 y.o.   MRN: [id number]  HPI Presents for followup after CT of LS-spine. States in reality, pain in his lower back, and his hips have improved. No problems with ambulation. At this time.   Review of Systems As per history of present illness.    Objective:   Physical Exam CT of LS-spine showed some degenerative disc changes at the L4-L5 level. Plus. There was also bilateral hydroceles noted on the examination. No dysplastic disease noted.       Assessment & Plan:  Degenerative Disc Changes L4-L5. For right now, proper body mechanics, and the use of a lumbar support when lifting objects. Was recommended. Should the pain. Return be ordered. Meloxicam 7.5 mg by mouth twice a day for him to try.  Bilateral Hydroceles. While he doesn't appear to be symptomatic with respect to this. We will go ahead and order ultrasound of the scrotum for further evaluation to determine if there is any circulatory. Impingement. There is any abnormalities on the ultrasound that require significant intervention, we will refer him to urology.  He verbally acknowledged all information that was provided to him and agreed with plan of care.

## 2010-11-25 ENCOUNTER — Telehealth: Payer: Self-pay | Admitting: *Deleted

## 2010-11-25 NOTE — Telephone Encounter (Signed)
Wanted imaging report . I told him the results. Wants to know what next. Told him I will give this to md & will call him with his response.. # [phone] I spoke with Dr. Orvan Falconer. He did not order this & pt should contact the md who did. I called the number back & his voice mail has not been set up. Will tell him this when he calls back

## 2010-11-25 NOTE — Telephone Encounter (Signed)
He has a U/S done & wants to discuss results. Our NP ordered it. Message to him to please call pt. He is at work & you may have to leave a message [phone]

## 2010-11-25 NOTE — Telephone Encounter (Signed)
The np ordered it. A message has been sent to him to call the pt

## 2010-12-16 ENCOUNTER — Other Ambulatory Visit: Payer: Self-pay | Admitting: *Deleted

## 2010-12-16 DIAGNOSIS — B20 Human immunodeficiency virus [HIV] disease: Secondary | ICD-10-CM

## 2010-12-16 MED ORDER — EFAVIRENZ-EMTRICITAB-TENOFOVIR 600-200-300 MG PO TABS: 1.0000 | ORAL_TABLET | Freq: Every day | ORAL | Status: DC

## 2011-01-26 LAB — T-HELPER CELL (CD4) - (RCID CLINIC ONLY): CD4 T Cell Abs: 990

## 2011-01-27 LAB — CREATININE, SERUM: GFR calc Af Amer: >60

## 2011-01-27 LAB — BUN: BUN: 10

## 2011-02-13 LAB — T-HELPER CELL (CD4) - (RCID CLINIC ONLY): CD4 % Helper T Cell: 38

## 2011-02-23 ENCOUNTER — Other Ambulatory Visit (INDEPENDENT_AMBULATORY_CARE_PROVIDER_SITE_OTHER): Payer: BC Managed Care – PPO

## 2011-02-23 DIAGNOSIS — Z23 Encounter for immunization: Secondary | ICD-10-CM

## 2011-02-23 DIAGNOSIS — B20 Human immunodeficiency virus [HIV] disease: Secondary | ICD-10-CM

## 2011-02-24 LAB — HIV-1 RNA QUANT-NO REFLEX-BLD
HIV 1 RNA Quant: <20 copies/mL (ref <20)
HIV-1 RNA Quant, Log: <1.3 {Log} (ref <1.30)

## 2011-02-24 LAB — T-HELPER CELL (CD4) - (RCID CLINIC ONLY): CD4 % Helper T Cell: 43 % (ref 33–55)

## 2011-03-10 ENCOUNTER — Ambulatory Visit (INDEPENDENT_AMBULATORY_CARE_PROVIDER_SITE_OTHER): Payer: BC Managed Care – PPO | Admitting: Internal Medicine

## 2011-03-10 ENCOUNTER — Encounter: Payer: Self-pay | Admitting: Internal Medicine

## 2011-03-10 VITALS — BP 151/85 | HR 69 | Temp 97.7°F | Ht 66.0 in | Wt 144.5 lb

## 2011-03-10 DIAGNOSIS — Z23 Encounter for immunization: Secondary | ICD-10-CM

## 2011-03-10 DIAGNOSIS — I10 Essential (primary) hypertension: Secondary | ICD-10-CM

## 2011-03-10 DIAGNOSIS — B20 Human immunodeficiency virus [HIV] disease: Secondary | ICD-10-CM

## 2011-03-10 DIAGNOSIS — K5900 Constipation, unspecified: Secondary | ICD-10-CM

## 2011-03-10 MED ORDER — HYDROCHLOROTHIAZIDE 50 MG PO TABS: 50.0000 mg | ORAL_TABLET | Freq: Every day | ORAL | Status: DC

## 2011-03-10 NOTE — Assessment & Plan Note (Signed)
His blood pressure is up slightly due to the fact that he is off of his hydrochlorothiazide. He will pick it up and restarted today

## 2011-03-10 NOTE — Assessment & Plan Note (Signed)
Charles Daniel's HIV infection is under excellent control with a CD4 count of 810 and a Viral load of less than 20 . I will continue his current regimen.

## 2011-03-10 NOTE — Assessment & Plan Note (Signed)
I have instructed him to restart his daily stool softener.

## 2011-03-10 NOTE — Progress Notes (Signed)
  Subjective:    Patient ID: Charles Daniel, male    DOB: 06/30/1940, 70 y.o.   MRN: [id number]  HPI Charles Daniel is in for his routine visit. As usual he does not miss a single dose of his Atripla and he believes he is doing better taking his blood pressure medication. His annual prescription for hydrochlorothiazide ran out recently forcing him to miss doses for the last 4-5 days. A new prescription is ready for him to pick up at his pharmacy. He continues to struggle with constipation and finds that he needs to take ex-lax 2-3 times per month. When he does he will then have several days of more frequent bowel movements, up to 5-6 times daily. He has not been taking any stool softeners consistently.    Review of Systems     Objective:   Physical Exam  Constitutional: He appears well-developed and well-nourished. No distress.  HENT:  Mouth/Throat: Oropharynx is clear and moist. No oropharyngeal exudate.  Cardiovascular: Normal rate, regular rhythm and normal heart sounds.   No murmur heard. Pulmonary/Chest: Breath sounds normal. He has no rales.  Psychiatric: He has a normal mood and affect.          Assessment & Plan:

## 2011-05-05 DIAGNOSIS — K5790 Diverticulosis of intestine, part unspecified, without perforation or abscess without bleeding: Secondary | ICD-10-CM

## 2011-05-05 HISTORY — DX: Diverticulosis of intestine, part unspecified, without perforation or abscess without bleeding: K57.90

## 2011-07-03 ENCOUNTER — Encounter: Payer: Self-pay | Admitting: Gastroenterology

## 2011-07-08 ENCOUNTER — Encounter: Payer: Self-pay | Admitting: Gastroenterology

## 2011-07-21 ENCOUNTER — Encounter: Payer: Self-pay | Admitting: Gastroenterology

## 2011-07-21 ENCOUNTER — Ambulatory Visit (AMBULATORY_SURGERY_CENTER): Payer: BC Managed Care – PPO | Admitting: *Deleted

## 2011-07-21 VITALS — Ht 66.0 in | Wt 141.9 lb

## 2011-07-21 DIAGNOSIS — Z85038 Personal history of other malignant neoplasm of large intestine: Secondary | ICD-10-CM

## 2011-07-21 DIAGNOSIS — Z1211 Encounter for screening for malignant neoplasm of colon: Secondary | ICD-10-CM

## 2011-07-21 MED ORDER — PEG-KCL-NACL-NASULF-NA ASC-C 100 G PO SOLR: ORAL | Status: DC

## 2011-07-21 NOTE — Progress Notes (Signed)
Pt states his son in law will bring him that day and he doesn't want him to know the results

## 2011-08-03 HISTORY — PX: COLONOSCOPY: SHX174

## 2011-08-04 ENCOUNTER — Encounter: Payer: Self-pay | Admitting: Gastroenterology

## 2011-08-04 ENCOUNTER — Ambulatory Visit (AMBULATORY_SURGERY_CENTER): Payer: BC Managed Care – PPO | Admitting: Gastroenterology

## 2011-08-04 VITALS — BP 159/82 | HR 50 | Temp 97.0°F | Resp 15 | Ht 66.0 in | Wt 141.0 lb

## 2011-08-04 DIAGNOSIS — D126 Benign neoplasm of colon, unspecified: Secondary | ICD-10-CM

## 2011-08-04 DIAGNOSIS — Z1211 Encounter for screening for malignant neoplasm of colon: Secondary | ICD-10-CM

## 2011-08-04 MED ORDER — SODIUM CHLORIDE 0.9 % IV SOLN: 500.0000 mL | INTRAVENOUS | Status: DC

## 2011-08-04 NOTE — Op Note (Signed)
[facility] [address] [place], [place]  [address]  COLONOSCOPY PROCEDURE REPORT  PATIENT:  Charles Daniel, Charles Daniel  MR#:  [id number] BIRTHDATE:  [birth year], 70 yrs. old  GENDER:  male ENDOSCOPIST:  Barbette Hair. Arlyce Dice, MD REF. BY:  Cliffton Asters, M.D. PROCEDURE DATE:  08/04/2011 PROCEDURE:  Colonoscopy with polypectomy and submucosal injection, Colon with cold biopsy polypectomy ASA CLASS:  Class II INDICATIONS:  Screening, history of colon cancer Segmental colectomhy 2007 MEDICATIONS:   MAC sedation, administered by CRNA propofol 225mg IV  DESCRIPTION OF PROCEDURE:   After the risks benefits and alternatives of the procedure were thoroughly explained, informed consent was obtained.  Digital rectal exam was performed and revealed no abnormalities.   The LB 180AL [id number] endoscope was introduced through the anus and advanced to the cecum, which was identified by both the appendix and ileocecal valve, without limitations.  The quality of the prep was good, using MoviPrep. The instrument was then slowly withdrawn as the colon was fully examined. <<PROCEDUREIMAGES>>  FINDINGS:  A sessile polyp was found in the ascending colon. It was 15 mm in size. submucosal injection 6cc NS injected submucosally in and around polyp. Polyp was removed with hot snare and submitted to pathology (see image6).  Two polyps were found in the ascending colon. Sessile 2 and 3mm polyps - removed with cold bx forceps and cold polypectomy snare, respectively, submitted to pathology (see image4 and image7).  A diverticulum was found in the descending colon (see image1).  This was otherwise a normal examination of the colon (see image2, image8, and image9). Retroflexed views in the rectum revealed no abnormalities.    The time to cecum =  1) 8.25  minutes. The scope was then withdrawn in 1) 9.50  minutes from the cecum and the procedure completed. COMPLICATIONS:  None ENDOSCOPIC IMPRESSION: 1) 15 mm  sessile polyp in the ascending colon 2) Two polyps in the ascending colon 3) Diverticulum in the descending colon 4) Otherwise normal examination RECOMMENDATIONS: 1) Colonoscopy 5 years REPEAT EXAM:  In 5 year(s) for Colonoscopy.  ______________________________ Barbette Hair. Arlyce Dice, MD  CC:  n. eSIGNED:   Barbette Hair. Oiva Dibari at 08/04/2011 11:48 AM  Marijo Sanes, [phone]

## 2011-08-04 NOTE — Patient Instructions (Signed)

## 2011-08-05 ENCOUNTER — Telehealth: Payer: Self-pay | Admitting: *Deleted

## 2011-08-05 NOTE — Telephone Encounter (Signed)
  Follow up Call-  Call back number 08/04/2011  Post procedure Call Back phone  # [phone]  Permission to leave phone message Yes     Patient questions:  Do you have a fever, pain , or abdominal swelling? no Pain Score  0 *  Have you tolerated food without any problems? yes  Have you been able to return to your normal activities? yes  Do you have any questions about your discharge instructions: Diet   no Medications  no Follow up visit  no  Do you have questions or concerns about your Care? no  Actions: * If pain score is 4 or above: No action needed, pain <4.

## 2011-08-17 ENCOUNTER — Encounter: Payer: Self-pay | Admitting: Gastroenterology

## 2011-09-01 ENCOUNTER — Other Ambulatory Visit: Payer: BC Managed Care – PPO

## 2011-09-01 DIAGNOSIS — Z113 Encounter for screening for infections with a predominantly sexual mode of transmission: Secondary | ICD-10-CM

## 2011-09-01 DIAGNOSIS — B20 Human immunodeficiency virus [HIV] disease: Secondary | ICD-10-CM

## 2011-09-01 LAB — CBC
Hemoglobin: 13.1 g/dL (ref 13.0–17.0)
MCH: 28 pg (ref 26.0–34.0)
MCHC: 33.2 g/dL (ref 30.0–36.0)
MCV: 84.4 fL (ref 78.0–100.0)
Platelets: 268 10*3/uL (ref 150–400)

## 2011-09-01 LAB — LIPID PANEL
LDL Cholesterol: 91 mg/dL (ref 0–99)
Total CHOL/HDL Ratio: 3.5 Ratio
Triglycerides: 89 mg/dL (ref <150)
VLDL: 18 mg/dL (ref 0–40)

## 2011-09-01 LAB — COMPREHENSIVE METABOLIC PANEL
Alkaline Phosphatase: 75 U/L (ref 39–117)
Creat: 1.01 mg/dL (ref 0.50–1.35)
Glucose, Bld: 88 mg/dL (ref 70–99)
Sodium: 141 mEq/L (ref 135–145)
Total Bilirubin: 0.3 mg/dL (ref 0.3–1.2)
Total Protein: 7.1 g/dL (ref 6.0–8.3)

## 2011-09-01 NOTE — Progress Notes (Signed)
Addended by: Mariea Clonts D on: 09/01/2011 09:05 AM   Modules accepted: Orders

## 2011-09-02 LAB — T-HELPER CELL (CD4) - (RCID CLINIC ONLY): CD4 % Helper T Cell: 46 % (ref 33–55)

## 2011-09-03 LAB — HIV-1 RNA QUANT-NO REFLEX-BLD: HIV 1 RNA Quant: <20 copies/mL (ref <20)

## 2011-09-15 ENCOUNTER — Ambulatory Visit (INDEPENDENT_AMBULATORY_CARE_PROVIDER_SITE_OTHER): Payer: BC Managed Care – PPO | Admitting: Internal Medicine

## 2011-09-15 ENCOUNTER — Encounter: Payer: Self-pay | Admitting: Internal Medicine

## 2011-09-15 VITALS — BP 159/81 | HR 69 | Temp 98.1°F | Ht 66.0 in | Wt 140.5 lb

## 2011-09-15 DIAGNOSIS — B20 Human immunodeficiency virus [HIV] disease: Secondary | ICD-10-CM

## 2011-09-15 NOTE — Progress Notes (Signed)
Patient ID: Charles Daniel, male   DOB: 1941/02/15, 71 y.o.   MRN: [id number]  INFECTIOUS DISEASE PROGRESS NOTE    Subjective: Case is in for his routine visit. He is feeling well and has no current problems. He is looking forward to retirement in July. He recalls missing only one dose of his Atripla since his last visit. That occurred last week when he forgot to take it before falling asleep. He is worried about losing weight. He states that his appetite is very good. He does have some early satiety but no nausea, vomiting or diarrhea. He eats 3 meals a day and also snacks between meals.  Objective: Temp: 98.1 F (36.7 C) (05/14 0839) Temp src: Oral (05/14 0839) BP: 159/81 mmHg (05/14 0839) Pulse Rate: 69  (05/14 0839)  General: His weight is down 4 pounds. His BMI is 22.8. He is in good spirits Skin: No rash Lungs: Clear Cor: Distant heart sounds with a regular S1 and S2 no murmurs Abdomen: Soft and nontender  Lab Results HIV 1 RNA Quant (copies/mL)  Date Value  09/01/2011 <20   02/23/2011 <20   09/16/2010 <20      CD4 T Cell Abs (cmm)  Date Value  09/01/2011 670   02/23/2011 810   09/16/2010 730      Assessment: His HIV is under excellent control. I will continue Atripla.  His blood pressure is not at goal. I asked him to try to reduce his salt intake.  I reassured him that his weight and BMI are healthy for him.  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 Several benign polyps were removed. He needs followup colonoscopy in 5 years  Plan: 1. Continue Atripla 2. Monitor blood pressure 3. Meet with financial counselor to assure that he has insurance coverage for medications after retirement 4. Followup in 6 months after lab work   Cliffton Asters, MD [facility] for Infectious Diseases [facility] [phone] pager   [phone] cell 09/15/2011, 8:57 AM

## 2011-10-20 ENCOUNTER — Encounter: Payer: Self-pay | Admitting: Internal Medicine

## 2011-10-20 ENCOUNTER — Ambulatory Visit (INDEPENDENT_AMBULATORY_CARE_PROVIDER_SITE_OTHER): Payer: BC Managed Care – PPO | Admitting: Internal Medicine

## 2011-10-20 VITALS — BP 171/89 | HR 69 | Temp 98.1°F | Ht 66.0 in | Wt 142.8 lb

## 2011-10-20 DIAGNOSIS — B20 Human immunodeficiency virus [HIV] disease: Secondary | ICD-10-CM

## 2011-10-20 NOTE — Progress Notes (Signed)
Patient ID: Charles Daniel, male   DOB: 04-May-1941, 71 y.o.   MRN: [id number]     [facility] for Infectious Disease  Patient Active Problem List  Diagnosis  . TUBERCULOSIS  . HIV DISEASE  . SYPHILIS  . ERECTILE DYSFUNCTION  . CIGARETTE SMOKER  . CARPAL TUNNEL SYNDROME  . HYPERTENSION  . CEREBROVASCULAR ACCIDENT  . HERNIA  . DIVERTICULOSIS, COLON  . CONSTIPATION  . WEIGHT LOSS  . HEPATITIS B, HX OF  . GASTROINTESTINAL HEMORRHAGE, HX OF  . COLECTOMY, PARTIAL, WITH ANASTOMOSIS, HX OF  . ADENOCARCINOMA, RECTUM  . 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 Hydrocele, bilateral    Patient's Medications  New Prescriptions   No medications on file  Previous Medications   ASPIRIN 325 MG TABLET    Take 325 mg by mouth daily.     EFAVIRENZ-EMTRICTABINE-TENOFOVIR (ATRIPLA) 600-200-300 MG PER TABLET    Take 1 tablet by mouth at bedtime.   HYDROCHLOROTHIAZIDE (HYDRODIURIL) 50 MG TABLET    Take 1 tablet (50 mg total) by mouth daily.  Modified Medications   No medications on file  Discontinued Medications   No medications on file    Subjective: Charles Daniel is in for his routine visit. He will be retiring at the end of July and transitioning from his work sponsored [employer] to [employer]. He has great concerns about not being able to afford his Atripla. He has not talked to a Artist. He has not missed a single dose of his Atripla and he is feeling well other than the anxiety about his insurance coverage.  Objective: Temp: 98.1 F (36.7 C) (06/18 0946) Temp src: Oral (06/18 0946) BP: 171/89 mmHg (06/18 0946) Pulse Rate: 69  (06/18 0946)  General: He is a little bit distressed about his insurance Skin: No rash Lungs: Clear Cor: Regular S1 and S2 with no murmurs  Lab Results HIV 1 RNA Quant (copies/mL)  Date Value  09/01/2011 <20   02/23/2011 <20   09/16/2010 <20      CD4 T Cell Abs (cmm)  Date Value  09/01/2011 670   02/23/2011 810     09/16/2010 730      Assessment: His HIV remains under excellent control. I will continue Atripla.  Plan: 1. Continue Atripla 2. Set him see a financial counselor about his Medicare 3. Encouraged him to establish a local primary care physician to help with his blood pressure control and primary care needs 4. Followup here after blood work in 6 months   Cliffton Asters, MD [facility] for Infectious Disease [facility] [phone] pager   [phone] cell 10/20/2011, 9:59 AM

## 2011-12-07 ENCOUNTER — Other Ambulatory Visit: Payer: Self-pay | Admitting: Internal Medicine

## 2011-12-11 ENCOUNTER — Telehealth: Payer: Self-pay | Admitting: *Deleted

## 2011-12-11 NOTE — Telephone Encounter (Signed)
Patient came into clinic stating he could not afford his Atripla.  He was told by BC/BS that Medicare does not cover this medication.  He will soon be retiring and will lose his private insurance.  Told him there is a program  called SPAP that covers what Medicare does not cover.  He said he just wants to switch his medication. Tried to explain that all the HIV medications are costly, but he just wanted an appt to discuss this with Dr. Orvan Falconer.  Patient given appt. Wendall Mola CMA

## 2011-12-23 ENCOUNTER — Encounter: Payer: Self-pay | Admitting: Internal Medicine

## 2011-12-23 ENCOUNTER — Ambulatory Visit (INDEPENDENT_AMBULATORY_CARE_PROVIDER_SITE_OTHER): Payer: Medicare Other | Admitting: Internal Medicine

## 2011-12-23 VITALS — BP 213/111 | HR 83 | Temp 98.0°F | Ht 66.0 in | Wt 144.2 lb

## 2011-12-23 DIAGNOSIS — B20 Human immunodeficiency virus [HIV] disease: Secondary | ICD-10-CM | POA: Diagnosis not present

## 2011-12-23 NOTE — Progress Notes (Signed)
Patient ID: Charles Daniel, male   DOB: 1940-07-20, 71 y.o.   MRN: [id number]  Charles Daniel was seen on a work in basis today because he is concerned about the transition from work based private insurance to [employer]. When he went to pick up his medications at [facility] recently he was told that the medications would cause $2000 and his co-pay would be over $700 a month. He was able to afford a 15 day supply but is very concerned that he will not be able to pay for his medication going forward.  I suspect that he does not have part D coverage. We will have him meet with a [facility] case manager and our financial counselor to sort these details out.  Charles Asters, MD [facility] for Infectious Disease [facility] [phone] pager   [phone] cell 12/23/2011, 2:49 PM

## 2011-12-25 ENCOUNTER — Other Ambulatory Visit: Payer: Self-pay | Admitting: *Deleted

## 2011-12-25 MED ORDER — EFAVIRENZ-EMTRICITAB-TENOFOVIR 600-200-300 MG PO TABS: 1.0000 | ORAL_TABLET | Freq: Every day | ORAL | Status: DC

## 2011-12-25 NOTE — Telephone Encounter (Signed)
For Dr. Blair Dolphin signature.  RN will give to [facility] CM, Delice Bison through Donzetta Sprung, CM.

## 2012-01-25 ENCOUNTER — Other Ambulatory Visit: Payer: Self-pay | Admitting: *Deleted

## 2012-01-25 DIAGNOSIS — B20 Human immunodeficiency virus [HIV] disease: Secondary | ICD-10-CM

## 2012-01-25 MED ORDER — EFAVIRENZ-EMTRICITAB-TENOFOVIR 600-200-300 MG PO TABS: 1.0000 | ORAL_TABLET | Freq: Every day | ORAL | Status: DC

## 2012-03-08 ENCOUNTER — Other Ambulatory Visit: Payer: Medicare Other

## 2012-03-08 ENCOUNTER — Other Ambulatory Visit (HOSPITAL_COMMUNITY)
Admission: RE | Admit: 2012-03-08 | Discharge: 2012-03-08 | Disposition: A | Discharge status: Home / Self Care | Payer: Medicare Other | Source: Ambulatory Visit | Attending: Internal Medicine | Admitting: Internal Medicine

## 2012-03-08 DIAGNOSIS — B20 Human immunodeficiency virus [HIV] disease: Secondary | ICD-10-CM

## 2012-03-08 DIAGNOSIS — Z113 Encounter for screening for infections with a predominantly sexual mode of transmission: Secondary | ICD-10-CM | POA: Insufficient documentation

## 2012-03-08 DIAGNOSIS — Z79899 Other long term (current) drug therapy: Secondary | ICD-10-CM | POA: Diagnosis not present

## 2012-03-08 LAB — COMPREHENSIVE METABOLIC PANEL
ALT: 12 U/L (ref 0–53)
Alkaline Phosphatase: 74 U/L (ref 39–117)
Creat: 1.08 mg/dL (ref 0.50–1.35)
Sodium: 139 mEq/L (ref 135–145)
Total Bilirubin: 0.3 mg/dL (ref 0.3–1.2)
Total Protein: 7.8 g/dL (ref 6.0–8.3)

## 2012-03-08 LAB — CBC
MCH: 27.6 pg (ref 26.0–34.0)
MCHC: 33 g/dL (ref 30.0–36.0)
MCV: 83.7 fL (ref 78.0–100.0)
Platelets: 268 10*3/uL (ref 150–400)
RBC: 5.29 MIL/uL (ref 4.22–5.81)
RDW: 15.3 % (ref 11.5–15.5)

## 2012-03-08 LAB — LIPID PANEL
Cholesterol: 125 mg/dL (ref 0–200)
LDL Cholesterol: 71 mg/dL (ref 0–99)
Total CHOL/HDL Ratio: 3.7 Ratio
VLDL: 20 mg/dL (ref 0–40)

## 2012-03-09 LAB — T-HELPER CELL (CD4) - (RCID CLINIC ONLY)
CD4 % Helper T Cell: 38 % (ref 33–55)
CD4 T Cell Abs: 770 uL (ref 400–2700)

## 2012-03-09 LAB — HIV-1 RNA QUANT-NO REFLEX-BLD: HIV 1 RNA Quant: <20 copies/mL (ref <20)

## 2012-03-22 ENCOUNTER — Encounter: Payer: Self-pay | Admitting: Internal Medicine

## 2012-03-22 ENCOUNTER — Ambulatory Visit (INDEPENDENT_AMBULATORY_CARE_PROVIDER_SITE_OTHER): Payer: Medicare Other | Admitting: Internal Medicine

## 2012-03-22 ENCOUNTER — Ambulatory Visit: Payer: BC Managed Care – PPO | Admitting: Internal Medicine

## 2012-03-22 VITALS — BP 163/98 | HR 79 | Temp 98.5°F | Ht 66.0 in | Wt 150.0 lb

## 2012-03-22 DIAGNOSIS — Z0000 Encounter for general adult medical examination without abnormal findings: Secondary | ICD-10-CM

## 2012-03-22 DIAGNOSIS — Z23 Encounter for immunization: Secondary | ICD-10-CM

## 2012-03-22 DIAGNOSIS — Z79899 Other long term (current) drug therapy: Secondary | ICD-10-CM | POA: Diagnosis not present

## 2012-03-22 DIAGNOSIS — B20 Human immunodeficiency virus [HIV] disease: Secondary | ICD-10-CM
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
## 2012-03-25 ENCOUNTER — Other Ambulatory Visit: Payer: Self-pay | Admitting: Internal Medicine

## 2012-04-18 ENCOUNTER — Telehealth: Payer: Self-pay | Admitting: *Deleted

## 2012-04-18 NOTE — Telephone Encounter (Signed)
Pt already has an appointment to start primary care w/ Dr. [name] at [facility].

## 2012-04-20 ENCOUNTER — Encounter: Payer: Self-pay | Admitting: Infectious Diseases

## 2012-05-31 ENCOUNTER — Ambulatory Visit (INDEPENDENT_AMBULATORY_CARE_PROVIDER_SITE_OTHER): Payer: Medicare Other | Admitting: Family Medicine

## 2012-05-31 ENCOUNTER — Encounter: Payer: Self-pay | Admitting: Family Medicine

## 2012-05-31 VITALS — BP 132/70 | HR 86 | Temp 98.5°F | Ht 66.0 in | Wt 146.0 lb

## 2012-05-31 DIAGNOSIS — K5900 Constipation, unspecified: Secondary | ICD-10-CM | POA: Diagnosis not present

## 2012-05-31 DIAGNOSIS — C20 Malignant neoplasm of rectum: Secondary | ICD-10-CM

## 2012-05-31 DIAGNOSIS — I10 Essential (primary) hypertension: Secondary | ICD-10-CM

## 2012-05-31 DIAGNOSIS — J40 Bronchitis, not specified as acute or chronic: Secondary | ICD-10-CM

## 2012-05-31 DIAGNOSIS — R634 Abnormal weight loss: Secondary | ICD-10-CM | POA: Diagnosis not present

## 2012-05-31 DIAGNOSIS — I6350 Cerebral infarction due to unspecified occlusion or stenosis of unspecified cerebral artery: Secondary | ICD-10-CM

## 2012-05-31 DIAGNOSIS — B20 Human immunodeficiency virus [HIV] disease: Secondary | ICD-10-CM

## 2012-05-31 DIAGNOSIS — F17200 Nicotine dependence, unspecified, uncomplicated: Secondary | ICD-10-CM

## 2012-05-31 MED ORDER — CENTRUM SILVER PO TABS: 1.0000 | ORAL_TABLET | Freq: Every day | ORAL | Status: DC

## 2012-05-31 MED ORDER — AZITHROMYCIN 250 MG PO TABS: ORAL_TABLET | ORAL | Status: DC

## 2012-05-31 MED ORDER — GUAIFENESIN-CODEINE 100-10 MG/5ML PO SYRP: 5.0000 mL | ORAL_SOLUTION | Freq: Every evening | ORAL | Status: DC | PRN

## 2012-05-31 NOTE — Assessment & Plan Note (Signed)
Reviewed treatment of constipation. rec increased water, start stool softener daily, and increase fiber in diet.

## 2012-05-31 NOTE — Assessment & Plan Note (Signed)
Pt denies this dx. Wt Readings from Last 3 Encounters:  05/31/12 146 lb (66.225 kg)  03/22/12 150 lb (68.04 kg)  12/23/11 144 lb 4 oz (65.431 kg)  Body mass index is 23.56 kg/(m^2).

## 2012-05-31 NOTE — Assessment & Plan Note (Signed)
Last colonoscopy 08/2011, 3 polyps.  rec rpt 5 yrs Arlyce Dice).

## 2012-05-31 NOTE — Assessment & Plan Note (Signed)
Anticipate viral given short duration. Lungs somewhat coarse but afebrile and nontoxic. See pt instructions for plan. Treat with cheratussin for cough at night, and provided with [facility] script for zpack to fill if any worsening or not improving as expected - to cover atypical bronchitis.

## 2012-05-31 NOTE — Assessment & Plan Note (Signed)
Chronic, stable. Continue atripla.  Followed by Dr. Orvan Falconer.

## 2012-05-31 NOTE — Progress Notes (Signed)
Subjective:    Patient ID: Charles Daniel, male    DOB: July 19, 1940, 72 y.o.   MRN: [id number]  HPI CC: new pt to establish  Cough - 3d h/o cough, RN, sneezing, congestion, drainage.  Coughing up clear phlegm. Denies fevers/chills, ear or tooth pain, abd pain, vomiting/diarrhea.  Constipation issues - for last year.  Hasn't tried stool softener.  Had good stool this morning.  HTN - controlled with HCTZ 50mg  daily.  Longstanding dx H/o HIV - sees Dr. Orvan Falconer (ID) for this Q6 mo.  On atripla and viral load undetectable for last 20 yrs per pt. H/o colon cancer - 2007 s/p colon resection.  Arlyce Dice).  Last colonoscopy 08/2011 3 polyps, diverticulosis, rec rpt 5 yrs Arlyce Dice).  Preventative: Colon cancer - h/o this.  S/p colonoscopy 08/2011 with polyps, rec rpt 5 yrs Arlyce Dice)  Flu 2013 Tetanus - has not received.  Requests at next OV. Pneumovax - 2012, completed  Caffeine: 3 sodas/day Lives with wife.  2 grown sons. 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  Past histories reviewed and updated if relevant as below. Patient Active Problem List  Diagnosis  . TUBERCULOSIS  . HIV DISEASE  . SYPHILIS  . ERECTILE DYSFUNCTION  . CIGARETTE SMOKER  . CARPAL TUNNEL SYNDROME  . HYPERTENSION  . CEREBROVASCULAR ACCIDENT  . HERNIA  . DIVERTICULOSIS, COLON  . CONSTIPATION  . WEIGHT LOSS  . HEPATITIS B, HX OF  . GASTROINTESTINAL HEMORRHAGE, HX OF  . ADENOCARCINOMA, RECTUM  . 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 Hydrocele, bilateral   Past Medical History  Diagnosis Date  . HIV infection   . History of colon cancer 2007    s/p colectomy  . Hypertension   . History of CVA (cerebrovascular accident) 1995    on ASA daily   Past Surgical History  Procedure Date  . Colonoscopy 08/2011    3 polyps, diverticulosis, rec rpt 5 yrs  Arlyce Dice)  . Hernia repair 1990s  . Colectomy    History  Substance Use Topics  . Smoking status: Former Smoker -- 0.3 packs/day for 35 years    Types: Cigarettes    Quit date: 03/04/2009  . Smokeless tobacco: Never Used  . Alcohol Use: No   Family History  Problem Relation Age of Onset  . Cancer Sister     breast  . Cancer Father     unsure  . Diabetes Neg Hx   . CAD Neg Hx   . Stroke Neg Hx    No Known Allergies Current Outpatient Prescriptions on File Prior to Visit  Medication Sig Dispense Refill  . aspirin 325 MG tablet Take 325 mg by mouth daily.        Marland Kitchen efavirenz-emtricitabine-tenofovir (ATRIPLA) 600-200-300 MG per tablet Take 1 tablet by mouth daily.  30 tablet  12  . hydrochlorothiazide (HYDRODIURIL) 50 MG tablet TAKE 1 TABLET (50 MG TOTAL) BY MOUTH DAILY.  30 tablet  7     Review of Systems  Constitutional: Negative for fever, chills, activity change, appetite change, fatigue and unexpected weight change.  HENT: Positive for congestion and sneezing. Negative for hearing loss and neck pain.   Eyes: Negative for visual disturbance.  Respiratory: Positive for cough (productive of mucous) and wheezing. Negative for chest tightness and shortness of breath.   Cardiovascular: Negative for chest pain, palpitations and leg swelling.  Gastrointestinal:  Positive for constipation. Negative for nausea, vomiting, abdominal pain, diarrhea, blood in stool and abdominal distention.  Genitourinary: Negative for hematuria and difficulty urinating.  Musculoskeletal: Negative for myalgias and arthralgias.  Skin: Negative for rash.  Neurological: Negative for dizziness, seizures, syncope and headaches.  Hematological: Does not bruise/bleed easily.  Psychiatric/Behavioral: Negative for dysphoric mood. The patient is not nervous/anxious.        Objective:   Physical Exam  Nursing note and vitals reviewed. Constitutional: He is oriented to person, place, and time. He appears  well-developed and well-nourished. No distress.  HENT:  Head: Normocephalic and atraumatic.  Right Ear: Hearing, tympanic membrane, external ear and ear canal normal.  Left Ear: Hearing, tympanic membrane, external ear and ear canal normal.  Nose: Nose normal. No mucosal edema or rhinorrhea.  Mouth/Throat: Uvula is midline, oropharynx is clear and moist and mucous membranes are normal. No oropharyngeal exudate, posterior oropharyngeal edema, posterior oropharyngeal erythema or tonsillar abscesses.  Eyes: Conjunctivae normal and EOM are normal. Pupils are equal, round, and reactive to light. No scleral icterus.  Neck: Normal range of motion. Neck supple. Carotid bruit is not present. No thyromegaly present.  Cardiovascular: Normal rate, regular rhythm, normal heart sounds and intact distal pulses.   No murmur heard. Pulses:      Radial pulses are 2+ on the right side, and 2+ on the left side.  Pulmonary/Chest: Effort normal. No respiratory distress. He has no decreased breath sounds. He has no wheezes. He has rhonchi. He has no rales.       Coarse breath sounds  Abdominal: Soft. Bowel sounds are normal. He exhibits no distension and no mass. There is no tenderness. There is no rebound and no guarding.  Musculoskeletal: Normal range of motion. He exhibits no edema.  Lymphadenopathy:    He has no cervical adenopathy.  Neurological: He is alert and oriented to person, place, and time.       CN grossly intact, station and gait intact  Skin: Skin is warm and dry. No rash noted.  Psychiatric: He has a normal mood and affect. His behavior is normal. Judgment and thought content normal.       Assessment & Plan:

## 2012-05-31 NOTE — Assessment & Plan Note (Signed)
Compliant with aspirin 325mg  daily.

## 2012-05-31 NOTE — Patient Instructions (Addendum)
Start colace or docusate 100mg  once daily as needed for constipation (over the counter) Increase water, and increase fiber in diet - fruits/vegetables.  High fiber foods handout provided today. For cough - I think likely viral bronchitis - treat with cheratussin for cough at night time (may make you sleepy so don't drive with it). Use simple mucinex over the counter twice daily to help break up mucous - best way it works is if you drink plenty of fluid. Lots of fluids and lots of rest. If not better with above, or any fever >101, worsening productive cough, fill antibiotic (prescription provided today). Return for physical in a few months (medicare wellness visit)

## 2012-05-31 NOTE — Assessment & Plan Note (Signed)
Chronic, on HCTZ 50mg  daily, K stable.

## 2012-07-18 ENCOUNTER — Other Ambulatory Visit: Payer: Self-pay | Admitting: Family Medicine

## 2012-07-26 ENCOUNTER — Other Ambulatory Visit: Payer: Medicare Other

## 2012-08-02 ENCOUNTER — Encounter: Payer: Self-pay | Admitting: Family Medicine

## 2012-08-02 ENCOUNTER — Ambulatory Visit (INDEPENDENT_AMBULATORY_CARE_PROVIDER_SITE_OTHER): Payer: Medicare Other | Admitting: Family Medicine

## 2012-08-02 VITALS — BP 140/80 | HR 82 | Temp 98.1°F | Ht 67.0 in | Wt 144.0 lb

## 2012-08-02 DIAGNOSIS — Z125 Encounter for screening for malignant neoplasm of prostate: Secondary | ICD-10-CM | POA: Diagnosis not present

## 2012-08-02 DIAGNOSIS — Z23 Encounter for immunization: Secondary | ICD-10-CM | POA: Diagnosis not present

## 2012-08-02 DIAGNOSIS — Z0000 Encounter for general adult medical examination without abnormal findings: Secondary | ICD-10-CM | POA: Diagnosis not present

## 2012-08-02 DIAGNOSIS — C20 Malignant neoplasm of rectum: Secondary | ICD-10-CM | POA: Diagnosis not present

## 2012-08-02 NOTE — Progress Notes (Signed)
Subjective:    Patient ID: Charles Daniel, male    DOB: [birth year], 72 y.o.   MRN: [id number]  HPI CC: medicare wellness visit.  Preventative:  Colon cancer - h/o this. S/p colonoscopy 08/2011 with polyps, rec rpt 5 yrs Arlyce Dice)  Prostate cancer screening - has never been evaluated for this. no nocturia, no trouble with stream.  brother recently dx with prostate cancer s/p seed implants. Flu 2013  Tetanus - Td today. Pneumovax - 2012, completed  Advanced directives: would want wife to be HCPOA.  Would like packet of advanced directives  Declines depression,anhedonia, falls Hearing and vision screens today.  Caffeine: 3 sodas/day  Lives with wife. 2 grown sons.  Occupation: retired from [employer]  Edu: 10th grade  Activity: no regular exercise  Diet: seldom water, fruits regular  Medications and allergies reviewed and updated in chart.  Past histories reviewed and updated if relevant as below. Patient Active Problem List  Diagnosis  . TUBERCULOSIS  . HIV DISEASE  . SYPHILIS  . ERECTILE DYSFUNCTION  . Ex-smoker  . HYPERTENSION  . CEREBROVASCULAR ACCIDENT  . CONSTIPATION  . WEIGHT LOSS  . HEPATITIS B, HX OF  . GASTROINTESTINAL HEMORRHAGE, HX OF  . ADENOCARCINOMA, RECTUM  . 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 Bronchitis   Past Medical History  Diagnosis Date  . HIV infection   . History of colon cancer 2007    s/p colectomy  . Hypertension   . History of CVA (cerebrovascular accident) 1995    on ASA daily  . Bilateral hydrocele 2012  . Diverticulosis 2013    by colonoscopy   Past Surgical History  Procedure Laterality Date  . Colonoscopy  08/2011    3 polyps, diverticulosis, rec rpt 5 yrs Arlyce Dice)  . Hernia repair  1990s  . Colectomy     History  Substance Use Topics  . Smoking status: Former Smoker -- 0.30 packs/day for 35 years    Types: Cigarettes    Quit date: 03/04/2009  . Smokeless tobacco: Never Used  . Alcohol Use: No    Family History  Problem Relation Age of Onset  . Cancer Sister     breast  . Cancer Father     unsure  . Diabetes Neg Hx   . CAD Neg Hx   . Stroke Neg Hx    No Known Allergies Current Outpatient Prescriptions on File Prior to Visit  Medication Sig Dispense Refill  . aspirin 325 MG tablet Take 325 mg by mouth daily.        Marland Kitchen docusate sodium (COLACE) 100 MG capsule Take 100 mg by mouth daily as needed.      Marland Kitchen efavirenz-emtricitabine-tenofovir (ATRIPLA) 600-200-300 MG per tablet Take 1 tablet by mouth daily.  30 tablet  12  . hydrochlorothiazide (HYDRODIURIL) 50 MG tablet TAKE 1 TABLET (50 MG TOTAL) BY MOUTH DAILY.  30 tablet  7  . Multiple Vitamins-Minerals (CENTRUM SILVER) tablet Take 1 tablet by mouth daily.  90 tablet  3   No current facility-administered medications on file prior to visit.     Review of Systems  Constitutional: Negative for fever, chills, activity change, appetite change, fatigue and unexpected weight change.  HENT: Negative for hearing loss, congestion, sneezing and neck pain.   Eyes: Negative for visual disturbance.  Respiratory: Negative for cough, chest tightness, shortness of breath and wheezing.   Cardiovascular: Negative for chest pain, palpitations and leg swelling.  Gastrointestinal: Negative for nausea, vomiting, abdominal pain,  diarrhea, constipation, blood in stool and abdominal distention.  Genitourinary: Negative for hematuria and difficulty urinating.  Musculoskeletal: Negative for myalgias and arthralgias.  Skin: Negative for rash.  Neurological: Negative for dizziness, seizures, syncope and headaches.  Hematological: Does not bruise/bleed easily.  Psychiatric/Behavioral: Negative for dysphoric mood. The patient is not nervous/anxious.        Objective:   Physical Exam  Nursing note and vitals reviewed. Constitutional: He is oriented to person, place, and time. He appears well-developed and well-nourished. No distress.  HENT:  Head:  Normocephalic and atraumatic.  Right Ear: Hearing, tympanic membrane, external ear and ear canal normal.  Left Ear: Hearing, tympanic membrane, external ear and ear canal normal.  Nose: Nose normal.  Mouth/Throat: Oropharynx is clear and moist. No oropharyngeal exudate.  Eyes: Conjunctivae and EOM are normal. Pupils are equal, round, and reactive to light. No scleral icterus.  Cataracts bilaterally  Neck: Normal range of motion. Neck supple. No thyromegaly present.  Cardiovascular: Normal rate, regular rhythm, normal heart sounds and intact distal pulses.   No murmur heard. Pulses:      Radial pulses are 2+ on the right side, and 2+ on the left side.  Pulmonary/Chest: Effort normal and breath sounds normal. No respiratory distress. He has no wheezes. He has no rales.  Abdominal: Soft. Bowel sounds are normal. He exhibits no distension and no mass. There is no tenderness. There is no rebound and no guarding.  Genitourinary: Rectum normal and prostate normal. Rectal exam shows no external hemorrhoid, no internal hemorrhoid, no fissure, no mass, no tenderness and anal tone normal. Prostate is not enlarged (15gm) and not tender.  Musculoskeletal: Normal range of motion. He exhibits no edema.  Lymphadenopathy:    He has no cervical adenopathy.  Neurological: He is alert and oriented to person, place, and time.  CN grossly intact, station and gait intact  Skin: Skin is warm and dry. No rash noted.  Psychiatric: He has a normal mood and affect. His behavior is normal. Judgment and thought content normal.       Assessment & Plan:

## 2012-08-02 NOTE — Assessment & Plan Note (Signed)
I have personally reviewed the Medicare Annual Wellness questionnaire and have noted 1. The patient's medical and social history 2. Their use of alcohol, tobacco or illicit drugs 3. Their current medications and supplements 4. The patient's functional ability including ADL's, fall risks, home safety risks and hearing or visual impairment. 5. Diet and physical activity 6. Evidence for depression or mood disorders The patients weight, height, BMI have been recorded in the chart.  Hearing and vision has been addressed. I have made referrals, counseling and provided education to the patient based review of the above and I have provided the pt with a written personalized care plan for preventive services. See scanned questionairre. Advanced directives discussed: packet provided.  Thinks would want wife to be HCPOA.  Reviewed preventative protocols and updated unless pt declined. fmhx prostate cancer (brother) - requests screening, done today. Td today.

## 2012-08-02 NOTE — Addendum Note (Signed)
Addended by: Chandra Batch E on: 08/02/2012 03:55 PM   Modules accepted: Orders

## 2012-08-02 NOTE — Patient Instructions (Addendum)
Tetanus shot today (Td) Advanced directives packet provided today. Prostate exam and blood test today. Good cholesterol (HDL) was a bit low last check (03/2012) - work on increased aerobic activity (walking.) When you can, schedule eye doctor appointment. Good to see you! Call us with questions, return in 6 months for follow up or as needed.

## 2012-08-02 NOTE — Assessment & Plan Note (Signed)
Stable, colonoscopy UTD.

## 2012-08-03 LAB — PSA, MEDICARE: PSA: 10.81 ng/ml — ABNORMAL HIGH (ref 0.10–4.00)

## 2012-08-04 ENCOUNTER — Other Ambulatory Visit: Payer: Self-pay | Admitting: Family Medicine

## 2012-08-04 DIAGNOSIS — R972 Elevated prostate specific antigen [PSA]: Secondary | ICD-10-CM

## 2012-08-25 DIAGNOSIS — N402 Nodular prostate without lower urinary tract symptoms: Secondary | ICD-10-CM | POA: Diagnosis not present

## 2012-08-25 DIAGNOSIS — R972 Elevated prostate specific antigen [PSA]: Secondary | ICD-10-CM | POA: Diagnosis not present

## 2012-08-26 ENCOUNTER — Other Ambulatory Visit: Payer: Self-pay | Admitting: *Deleted

## 2012-08-26 DIAGNOSIS — B20 Human immunodeficiency virus [HIV] disease: Secondary | ICD-10-CM

## 2012-08-26 MED ORDER — EFAVIRENZ-EMTRICITAB-TENOFOVIR 600-200-300 MG PO TABS: 1.0000 | ORAL_TABLET | Freq: Every day | ORAL | Status: DC

## 2012-08-26 NOTE — Telephone Encounter (Signed)
SPAP ran out.  Needs to reapply.  Advised to cal B. Jeraldine Loots 08/29/12.  Sent refill for Atripla to [facility] per patient's request.

## 2012-09-01 HISTORY — PX: PROSTATE BIOPSY: SHX241

## 2012-09-06 ENCOUNTER — Ambulatory Visit: Payer: Medicare Other

## 2012-09-06 ENCOUNTER — Other Ambulatory Visit (INDEPENDENT_AMBULATORY_CARE_PROVIDER_SITE_OTHER): Payer: Medicare Other

## 2012-09-06 ENCOUNTER — Other Ambulatory Visit: Payer: Medicare Other

## 2012-09-06 ENCOUNTER — Encounter: Payer: Self-pay | Admitting: *Deleted

## 2012-09-06 DIAGNOSIS — Z79899 Other long term (current) drug therapy: Secondary | ICD-10-CM

## 2012-09-06 DIAGNOSIS — B20 Human immunodeficiency virus [HIV] disease: Secondary | ICD-10-CM

## 2012-09-06 LAB — CBC
HCT: 40.4 % (ref 39.0–52.0)
Hemoglobin: 13.1 g/dL (ref 13.0–17.0)
MCHC: 32.4 g/dL (ref 30.0–36.0)
WBC: 8 10*3/uL (ref 4.0–10.5)

## 2012-09-06 LAB — COMPREHENSIVE METABOLIC PANEL
Albumin: 4.2 g/dL (ref 3.5–5.2)
BUN: 9 mg/dL (ref 6–23)
CO2: 28 mEq/L (ref 19–32)
Calcium: 9.3 mg/dL (ref 8.4–10.5)
Chloride: 108 mEq/L (ref 96–112)
Glucose, Bld: 83 mg/dL (ref 70–99)
Potassium: 3.9 mEq/L (ref 3.5–5.3)

## 2012-09-06 LAB — LIPID PANEL
Cholesterol: 135 mg/dL (ref 0–200)
HDL: 42 mg/dL (ref >39)
Triglycerides: 71 mg/dL (ref <150)

## 2012-09-07 LAB — RPR TITER: RPR Titer: 1:2 {titer}

## 2012-09-08 LAB — HIV-1 RNA QUANT-NO REFLEX-BLD: HIV-1 RNA Quant, Log: <1.3 {Log} (ref <1.30)

## 2012-09-12 ENCOUNTER — Other Ambulatory Visit: Payer: Self-pay | Admitting: *Deleted

## 2012-09-12 ENCOUNTER — Ambulatory Visit: Payer: Medicare Other

## 2012-09-12 DIAGNOSIS — B20 Human immunodeficiency virus [HIV] disease: Secondary | ICD-10-CM

## 2012-09-12 MED ORDER — EFAVIRENZ-EMTRICITAB-TENOFOVIR 600-200-300 MG PO TABS: 1.0000 | ORAL_TABLET | Freq: Every day | ORAL | Status: DC

## 2012-09-20 ENCOUNTER — Ambulatory Visit: Payer: Medicare Other | Admitting: Internal Medicine

## 2012-09-22 DIAGNOSIS — R972 Elevated prostate specific antigen [PSA]: Secondary | ICD-10-CM | POA: Diagnosis not present

## 2012-09-22 DIAGNOSIS — N402 Nodular prostate without lower urinary tract symptoms: Secondary | ICD-10-CM | POA: Diagnosis not present

## 2012-09-30 ENCOUNTER — Telehealth: Payer: Self-pay

## 2012-09-30 NOTE — Telephone Encounter (Addendum)
Per [facility], patient was denied SPAP coverage due to not being able to get spouse's income - he will work with Randolm Idol in Patient Asst. Had sent his SPAP application with notarized letter stating spouse refused and would not help patient, though they live in same household - also, spoke with Tree surgeon in [facility] office and [place]. Advised patient Elita Quick would be back from vacation, 6/4 - he is going to call her.

## 2012-10-03 ENCOUNTER — Ambulatory Visit (INDEPENDENT_AMBULATORY_CARE_PROVIDER_SITE_OTHER): Payer: Medicare Other | Admitting: Internal Medicine

## 2012-10-03 VITALS — BP 161/108 | HR 91 | Temp 98.2°F | Resp 18 | Ht 66.0 in | Wt 144.8 lb

## 2012-10-03 DIAGNOSIS — B20 Human immunodeficiency virus [HIV] disease: Secondary | ICD-10-CM | POA: Diagnosis not present

## 2012-10-03 NOTE — Progress Notes (Signed)
Patient ID: Charles Daniel, male   DOB: [birth year], 72 y.o.   MRN: [id number]          [facility] for Infectious Disease  Patient Active Problem List   Diagnosis Date Noted  . 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 HIV DISEASE 05/15/2006    Priority: High  . HYPERTENSION 05/15/2006    Priority: High  . CEREBROVASCULAR ACCIDENT 05/15/2006    Priority: High  . Medicare annual wellness visit, initial 08/02/2012  . 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 WEIGHT LOSS 12/18/2008  . CONSTIPATION 03/16/2008  . TUBERCULOSIS 05/15/2006  . SYPHILIS 05/15/2006  . 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  . Ex-smoker 05/15/2006  . HEPATITIS B, HX OF 05/15/2006  . GASTROINTESTINAL HEMORRHAGE, HX OF 05/15/2006    Patient's Medications  New Prescriptions   No medications on file  Previous Medications   ASPIRIN 325 MG TABLET    Take 325 mg by mouth daily.     DOCUSATE SODIUM (COLACE) 100 MG CAPSULE    Take 100 mg by mouth daily as needed.   EFAVIRENZ-EMTRICITABINE-TENOFOVIR (ATRIPLA) 600-200-300 MG PER TABLET    Take 1 tablet by mouth daily.   HYDROCHLOROTHIAZIDE (HYDRODIURIL) 50 MG TABLET    TAKE 1 TABLET (50 MG TOTAL) BY MOUTH DAILY.   MULTIPLE VITAMINS-MINERALS (CENTRUM SILVER) TABLET    Take 1 tablet by mouth daily.  Modified Medications   No medications on file  Discontinued Medications   No medications on file    Subjective: [name] is in for his routine followup visit today. His SPAP application has been the cause his wife would not provide information about her income. Although she still lives with her Bennett he states that if there is ever anything that will benefit him, she will not do it. He says she is extremely stubborn and he doubts that she will change her mind. He has 6 Atripla tablets left. He recently started taking them every other day to make the supply last longer. He is feeling well and other than concerns about his medication he is  doing well.  Review of Systems: Pertinent items are noted in HPI.  Past Medical History  Diagnosis Date  . HIV infection   . History of colon cancer 2007    s/p colectomy  . Hypertension   . History of CVA (cerebrovascular accident) 1995    on ASA daily  . Bilateral hydrocele 2012  . Diverticulosis 2013    by colonoscopy    History  Substance Use Topics  . Smoking status: Former Smoker -- 0.30 packs/day for 35 years    Types: Cigarettes    Quit date: 03/04/2009  . Smokeless tobacco: Never Used  . Alcohol Use: No    Family History  Problem Relation Age of Onset  . Cancer Sister     breast  . Cancer Father     unsure  . Cancer Brother 71    prostate, treated with seed implant  . CAD Neg Hx   . Stroke Neg Hx   . Diabetes Neg Hx     No Known Allergies  Objective: Temp: 98.2 F (36.8 C) (06/02 1454) Temp src: Oral (06/02 1454) BP: 161/108 mmHg (06/02 1454) Pulse Rate: 91 (06/02 1454)  General: He is in good spirits Oral: No oropharyngeal lesions Skin: Scattered, small firm papular lesions over forehead Lungs: Clear Cor: Regular S1 and S2 no murmurs  Lab Results HIV 1 RNA Quant (copies/mL)  Date Value  09/06/2012 <20   03/08/2012 <20   09/01/2011 <20      CD4 T Cell Abs (cmm)  Date Value  09/06/2012 920   03/08/2012 770   09/01/2011 670      Assessment: His HIV infection remains under excellent control. I've asked him to take his Atripla every evening until his supply is exhausted so that he does not promote the development of resistance. He will continue to meet with our financial counselors to see what options there are for antiretroviral therapy. If he cannot obtain Atripla we will look into switching him to other medication that may be easier to obtain.  Plan: 1. Continue six-day supply of Atripla 2. Financial counseling today 3. Followup in 2-3 weeks   Cliffton Asters, MD [facility] for Infectious Disease [facility] [phone]  pager   [phone] cell 10/03/2012, 3:19 PM

## 2012-10-06 ENCOUNTER — Other Ambulatory Visit: Payer: Self-pay | Admitting: Licensed Clinical Social Worker

## 2012-10-06 DIAGNOSIS — B20 Human immunodeficiency virus [HIV] disease: Secondary | ICD-10-CM

## 2012-10-06 MED ORDER — EFAVIRENZ-EMTRICITAB-TENOFOVIR 600-200-300 MG PO TABS: 1.0000 | ORAL_TABLET | Freq: Every day | ORAL | Status: DC

## 2012-10-14 DIAGNOSIS — R972 Elevated prostate specific antigen [PSA]: Secondary | ICD-10-CM | POA: Diagnosis not present

## 2012-10-14 DIAGNOSIS — N402 Nodular prostate without lower urinary tract symptoms: Secondary | ICD-10-CM | POA: Diagnosis not present

## 2012-10-17 ENCOUNTER — Encounter: Payer: Self-pay | Admitting: Family Medicine

## 2012-10-24 ENCOUNTER — Encounter: Payer: Self-pay | Admitting: Internal Medicine

## 2012-10-24 ENCOUNTER — Ambulatory Visit (INDEPENDENT_AMBULATORY_CARE_PROVIDER_SITE_OTHER): Payer: Medicare Other | Admitting: Internal Medicine

## 2012-10-24 VITALS — BP 164/94 | HR 80 | Temp 97.6°F | Ht 66.0 in | Wt 140.5 lb

## 2012-10-24 DIAGNOSIS — B20 Human immunodeficiency virus [HIV] disease: Secondary | ICD-10-CM | POA: Diagnosis not present

## 2012-10-24 NOTE — Progress Notes (Signed)
Patient ID: Charles Daniel, male   DOB: [birth year], 72 y.o.   MRN: [id number]  Charles Daniel was able to have his Atripla refills and was not forced to miss any doses. Apparently the prescription went through on his Medicare and he does not believe that there will be any problems obtaining his medicine going forward.  He did inform me that he was recently diagnosed with localized prostate cancer and will be starting radio seed implantation soon. He is under the care of Dr. Ihor Gully.  Charles Asters, MD [facility] for Infectious Disease [facility] [phone] pager   [phone] cell 10/24/2012, 3:14 PM

## 2012-10-25 NOTE — Addendum Note (Signed)
Addended by: Jennet Maduro D on: 10/25/2012 04:17 PM   Modules accepted: Orders

## 2012-11-07 NOTE — Telephone Encounter (Signed)
Encounter opened in error

## 2012-11-08 ENCOUNTER — Encounter: Payer: Self-pay | Admitting: Radiation Oncology

## 2012-11-08 DIAGNOSIS — C61 Malignant neoplasm of prostate: Secondary | ICD-10-CM | POA: Insufficient documentation

## 2012-11-08 DIAGNOSIS — C771 Secondary and unspecified malignant neoplasm of intrathoracic lymph nodes: Secondary | ICD-10-CM | POA: Insufficient documentation

## 2012-11-08 NOTE — Progress Notes (Signed)
Radiation Oncology         [phone] ________________________________  Initial outpatient Consultation  Name: Charles Daniel MRN: [id number]  Date: 11/09/2012  DOB: 03/21/1941  [id number] Sharen Hones, MD  Garnett Farm, MD   REFERRING PHYSICIAN: Garnett Farm, MD  DIAGNOSIS: 72 y.o. gentleman with stage T1c adenocarcinoma of the prostate with a Gleason's score of 3+4 and a PSA of 10.81  HISTORY OF PRESENT ILLNESS::Charles Daniel is a 72 y.o. gentleman.  He was noted to have an elevated PSA of 10.81 by his primary care physician, Dr. Sharen Hones.  Accordingly, he was referred for evaluation in urology by Dr. Vernie Ammons on 08/25/2012,  digital rectal examination was performed at that time revealing mild induration but no nodules.  The patient proceeded to transrectal ultrasound with 12 biopsies of the prostate on 09/22/2012.  The prostate volume measured 53.12 cc.  Out of 12 core biopsies, 3 were positive.  The maximum Gleason score was 3+4, and this was seen in 40% of the left base. Gleason's 3+3 was seen in 10% of the right mid and 5% of the left lateral mid specimens.  The patient reviewed the biopsy results with his urologist and he has kindly been referred today for discussion of potential radiation treatment options.  PREVIOUS RADIATION THERAPY: No  PAST MEDICAL HISTORY:  has a past medical history of HIV infection; History of colon cancer (2007); Hypertension; History of CVA (cerebrovascular accident) (1995); Bilateral hydrocele (2012); Diverticulosis (2013); Prostate carcinoma (09/2012); Colon cancer; Hepatitis, unspecified; and Acute, but ill-defined, cerebrovascular disease.    PAST SURGICAL HISTORY: Past Surgical History  Procedure Laterality Date  . Colonoscopy  08/2011    3 polyps, diverticulosis, rec rpt 5 yrs Arlyce Dice)  . Hernia repair  1990s  . Colectomy    . Prostate biopsy  09/2012    53cc    FAMILY HISTORY: family history includes Cancer in his brothers, daughter, father,  other, and sister and Cancer (age of onset: 24) in his brother.  There is no history of CAD, and Stroke, and Diabetes, .  SOCIAL HISTORY:  reports that he quit smoking about 3 years ago. His smoking use included Cigarettes. He has a 10.5 pack-year smoking history. He has never used smokeless tobacco. He reports that he does not drink alcohol or use illicit drugs.  ALLERGIES: Review of patient's allergies indicates no known allergies.  MEDICATIONS:  Current Outpatient Prescriptions  Medication Sig Dispense Refill  . aspirin 325 MG tablet Take 325 mg by mouth daily.        Marland Kitchen docusate sodium (COLACE) 100 MG capsule Take 100 mg by mouth daily as needed.      Marland Kitchen efavirenz-emtricitabine-tenofovir (ATRIPLA) 600-200-300 MG per tablet Take 1 tablet by mouth daily.  30 tablet  12  . hydrochlorothiazide (HYDRODIURIL) 50 MG tablet TAKE 1 TABLET (50 MG TOTAL) BY MOUTH DAILY.  30 tablet  7  . Multiple Vitamins-Minerals (CENTRUM SILVER) tablet Take 1 tablet by mouth daily.  90 tablet  3   No current facility-administered medications for this encounter.    REVIEW OF SYSTEMS:  A 15 point review of systems is documented in the electronic medical record. This was obtained by the nursing staff. However, I reviewed this with the patient to discuss relevant findings and make appropriate changes.  A comprehensive review of systems was negative..  The patient completed an IPSS and IIEF questionnaire.  His IPSS score was 14 indicating moderate urinary outflow obstructive symptoms.  He indicated that his erectile  function is not a factor in his decision making.   PHYSICAL EXAM: This patient is in no acute distress.  He is alert and oriented.   height is 5\' 6"  (1.676 m) and weight is 138 lb 3.2 oz (62.687 kg). His oral temperature is 97.5 F (36.4 C). His blood pressure is 151/88 and his pulse is 61. His respiration is 18 and oxygen saturation is 100%.  He exhibits no respiratory distress or labored breathing.  He appears  neurologically intact.  His mood is pleasant.  His affect is appropriate.  Please note the digital rectal exam findings described above.  LABORATORY DATA:  Lab Results  Component Value Date   WBC 8.0 09/06/2012   HGB 13.1 09/06/2012   HCT 40.4 09/06/2012   MCV 84.7 09/06/2012   PLT 199 09/06/2012   Lab Results  Component Value Date   NA 141 09/06/2012   K 3.9 09/06/2012   CL 108 09/06/2012   CO2 28 09/06/2012   Lab Results  Component Value Date   ALT 11 09/06/2012   AST 14 09/06/2012   ALKPHOS 59 09/06/2012   BILITOT 0.4 09/06/2012     RADIOGRAPHY: No results found.    IMPRESSION: This gentleman is a 72 y.o. gentleman with stage T1c adenocarcinoma of the prostate with a Gleason's score of 3+4 and a PSA of 10.81.  His T-Stage, Gleason's Score, and PSA put him into the intermediate risk group. However, he does fall into a select subset of intermediate risk patients with low volume Gleason 7 disease who may in fact be amenable to prostate brachytherapy as monotherapy. His PSA also falls into the intermediate risk range since it is higher than 10.0. However, the value of 16.10 could certainly be considered borderline between the favorable and intermediate risk groups. Accordingly he is eligible for a variety of potential treatment options including robotic-assisted laparoscopic radical prostatectomy, external beam radiation treatment with or without seed boost, or prostate seed implant as monotherapy.  PLAN:Today I reviewed the findings and workup thus far.  We discussed the natural history of prostate cancer.  We reviewed the the implications of T-stage, Gleason's Score, and PSA on decision-making and outcomes in prostate cancer.  We discussed radiation treatment in the management of prostate cancer with regard to the logistics and delivery of external beam radiation treatment as well as the logistics and delivery of prostate brachytherapy.  We compared and contrasted each of these approaches and also compared  these against prostatectomy.  The patient expressed interest in prostate brachytherapy.  I filled out a patient counseling form for him with relevant treatment diagrams and we retained a copy for our records.   The patient would like to proceed with prostate brachytherapy.  I will share my findings with Dr. Vernie Ammons and move forward with scheduling the procedure in the near future.     I enjoyed meeting with him today, and will look forward to participating in the care of this very nice gentleman.   I spent 60 minutes face to face with the patient and more than 50% of that time was spent in counseling and/or coordination of care.   ------------------------------------------------  Artist Pais. Kathrynn Running, M.D.

## 2012-11-08 NOTE — Progress Notes (Signed)
GU Location of Tumor / Histology: adenocarcinoma of the prostate  If Prostate Cancer, Gleason Score is (3 + 4=7) and PSA is (10.81)  Patient presented August 25, 2012 with signs/symptoms of: elevated PSA and fear due to brother's recent diagnosis of prostate ca  Biopsies of prostate (if applicable) revealed: Adenocarcinoma of prostate, gleason 3+3=6 involves 40% of right mid, gleason 3+4=7 discontinuously involves 40% of one core left base, 3+3=6 involves 5% of one core left lat mid Prostate volume 53cc.  Past/Anticipated interventions by urology, if any: active surveillance not recommended therefore referral was made to radiation oncology   Past/Anticipated interventions by medical oncology, if any: None  Weight changes, if any: Denies  Bowel/Bladder complaints, if any: Denies dysuria, hematuria or voiding symptoms   Nausea/Vomiting, if any: Denies  Pain issues, if any:  Denies any unexplained bone pain.  SAFETY ISSUES:  Prior radiation? NO  Pacemaker/ICD? NO  Possible current pregnancy? NO  Is the patient on methotrexate? NO  Current Complaints / other details:  72 year old male. HIV positive. NKDA.

## 2012-11-09 ENCOUNTER — Ambulatory Visit
Admission: RE | Admit: 2012-11-09 | Discharge: 2012-11-09 | Disposition: A | Discharge status: Home / Self Care | Payer: Medicare Other | Source: Ambulatory Visit | Attending: Radiation Oncology | Admitting: Radiation Oncology

## 2012-11-09 ENCOUNTER — Encounter: Payer: Self-pay | Admitting: Radiation Oncology

## 2012-11-09 VITALS — BP 151/88 | HR 61 | Temp 97.5°F | Resp 18 | Ht 66.0 in | Wt 138.2 lb

## 2012-11-09 DIAGNOSIS — C61 Malignant neoplasm of prostate: Secondary | ICD-10-CM | POA: Diagnosis not present

## 2012-11-09 DIAGNOSIS — Z7982 Long term (current) use of aspirin: Secondary | ICD-10-CM | POA: Diagnosis not present

## 2012-11-09 DIAGNOSIS — Z8673 Personal history of transient ischemic attack (TIA), and cerebral infarction without residual deficits: Secondary | ICD-10-CM | POA: Insufficient documentation

## 2012-11-09 DIAGNOSIS — C20 Malignant neoplasm of rectum: Secondary | ICD-10-CM

## 2012-11-09 DIAGNOSIS — I10 Essential (primary) hypertension: Secondary | ICD-10-CM | POA: Diagnosis not present

## 2012-11-09 DIAGNOSIS — Z85038 Personal history of other malignant neoplasm of large intestine: Secondary | ICD-10-CM | POA: Insufficient documentation

## 2012-11-09 DIAGNOSIS — Z79899 Other long term (current) drug therapy: Secondary | ICD-10-CM | POA: Diagnosis not present

## 2012-11-09 NOTE — Progress Notes (Signed)
See progress note under physician encounter. 

## 2012-11-09 NOTE — Progress Notes (Signed)
Interest most in seed implants. Reports occasional burning with urination. Denies bone pain. Denies hematuria. Reports a weak urine stream but, denies difficulty emptying his bladder. Denies incontinence or urgency. Reports regular formed bowel movements without pain or blood. Reports that he has lost 10 lb over the last month unintentionally. Reports drinking 4-5 cans of ensure daily. Reports regular night sweats.Reports fatigue. Reports that he sleep often during the day. Reports a good appetite. Reports sleeping without difficulty. Reports drinking 4-5 cans of ensure per day to gain weight but, has been unsuccessful thus far.

## 2012-11-09 NOTE — Progress Notes (Signed)
Complete PATIENT MEASURE OF DISTRESS worksheet with a score of 3 submitted to social work.  

## 2012-11-10 ENCOUNTER — Telehealth: Payer: Self-pay | Admitting: *Deleted

## 2012-11-10 ENCOUNTER — Encounter: Payer: Self-pay | Admitting: Family Medicine

## 2012-11-10 NOTE — Telephone Encounter (Signed)
Called patient to inform of pre-seed appt. For 11-11-12- arrival time - 2:45 p.m., lvm for a return call

## 2012-11-11 ENCOUNTER — Encounter: Payer: Self-pay | Admitting: Radiation Oncology

## 2012-11-11 ENCOUNTER — Ambulatory Visit
Admission: RE | Admit: 2012-11-11 | Discharge: 2012-11-11 | Disposition: A | Discharge status: Home / Self Care | Payer: Medicare Other | Source: Ambulatory Visit | Attending: Radiation Oncology | Admitting: Radiation Oncology

## 2012-11-11 DIAGNOSIS — C61 Malignant neoplasm of prostate: Secondary | ICD-10-CM | POA: Diagnosis not present

## 2012-11-11 NOTE — Progress Notes (Signed)
  Radiation Oncology         [phone]) [phone] ________________________________  Name: Hero Kulish MRN: [id number]  Date: 11/11/2012  DOB: 23-Jun-1940  SIMULATION AND TREATMENT PLANNING NOTE PUBIC ARCH STUDY  [id number] Sharen Hones, MD  Garnett Farm, MD  DIAGNOSIS:   72 y.o. gentleman with stage T1c adenocarcinoma of the prostate with a Gleason's score of 3+4 and a PSA of 10.81  COMPLEX SIMULATION:  The patient presented today for evaluation for possible prostate seed implant. He was brought to the radiation planning suite and placed supine on the CT couch. A 3-dimensional image study set was obtained in upload to the planning computer. There, on each axial slice, I contoured the prostate gland. Then, using three-dimensional radiation planning tools I reconstructed the prostate in view of the structures from the transperineal needle pathway to assess for possible pubic arch interference. In doing so, I did not appreciate any pubic arch interference. Also, the patient's prostate volume was estimated based on the drawn structure. The volume was 38 cc.  Given the pubic arch appearance and prostate volume, patient remains a good candidate to proceed with prostate seed implant. Today, he freely provided informed written consent to proceed.    PLAN: The patient will undergo prostate seed implant.   ________________________________  Artist Pais. Kathrynn Running, M.D.

## 2012-11-11 NOTE — Telephone Encounter (Signed)
XXXX 

## 2012-11-14 ENCOUNTER — Other Ambulatory Visit: Payer: Self-pay | Admitting: Urology

## 2012-11-15 ENCOUNTER — Telehealth: Payer: Self-pay | Admitting: *Deleted

## 2012-11-15 NOTE — Telephone Encounter (Signed)
CALLED PATIENT TO INFORM OF IMPLANT DATE, LVM FOR A RETURN CALL 

## 2012-11-29 ENCOUNTER — Telehealth: Payer: Self-pay | Admitting: *Deleted

## 2012-11-29 NOTE — Telephone Encounter (Signed)
Called patient to ask about coming for chest x-ray and EKG, no answer will call later

## 2012-12-02 ENCOUNTER — Encounter (HOSPITAL_BASED_OUTPATIENT_CLINIC_OR_DEPARTMENT_OTHER)
Admission: RE | Admit: 2012-12-02 | Discharge: 2012-12-02 | Disposition: A | Discharge status: Home / Self Care | Payer: Medicare Other | Source: Ambulatory Visit | Attending: Urology | Admitting: Urology

## 2012-12-02 ENCOUNTER — Ambulatory Visit (HOSPITAL_BASED_OUTPATIENT_CLINIC_OR_DEPARTMENT_OTHER)
Admission: RE | Admit: 2012-12-02 | Discharge: 2012-12-02 | Disposition: A | Discharge status: Home / Self Care | Payer: Medicare Other | Source: Ambulatory Visit | Attending: Urology | Admitting: Urology

## 2012-12-02 DIAGNOSIS — Z01818 Encounter for other preprocedural examination: Secondary | ICD-10-CM | POA: Insufficient documentation

## 2012-12-02 DIAGNOSIS — I10 Essential (primary) hypertension: Secondary | ICD-10-CM | POA: Insufficient documentation

## 2012-12-02 DIAGNOSIS — C61 Malignant neoplasm of prostate: Secondary | ICD-10-CM | POA: Diagnosis not present

## 2012-12-02 DIAGNOSIS — Z01810 Encounter for preprocedural cardiovascular examination: Secondary | ICD-10-CM | POA: Insufficient documentation

## 2013-01-12 ENCOUNTER — Telehealth: Payer: Self-pay | Admitting: *Deleted

## 2013-01-12 ENCOUNTER — Encounter (HOSPITAL_BASED_OUTPATIENT_CLINIC_OR_DEPARTMENT_OTHER): Payer: Self-pay | Admitting: *Deleted

## 2013-01-12 NOTE — Telephone Encounter (Signed)
Called patient to remind of appt., lvm for a return call 

## 2013-01-13 ENCOUNTER — Telehealth: Payer: Self-pay | Admitting: Radiation Oncology

## 2013-01-13 NOTE — Telephone Encounter (Signed)
Phoned patient. No answer. No option to leave a message. Attempting to confirm if patient presented to [facility] outpatient surgery center today to obtain blood work needed. Will attempt to reach patient at a later date.

## 2013-01-16 ENCOUNTER — Telehealth: Payer: Self-pay | Admitting: *Deleted

## 2013-01-16 NOTE — Telephone Encounter (Signed)
CALLED PATIENT TO REMIND OF APPT. FOR LABS FOR 01-17-13, SPOKE WITH PATIENT'S WIFE AND SHE WILL GET THIS TAKEN CARE OF.

## 2013-01-18 ENCOUNTER — Encounter (HOSPITAL_BASED_OUTPATIENT_CLINIC_OR_DEPARTMENT_OTHER): Payer: Self-pay | Admitting: *Deleted

## 2013-01-18 DIAGNOSIS — Z8673 Personal history of transient ischemic attack (TIA), and cerebral infarction without residual deficits: Secondary | ICD-10-CM | POA: Diagnosis not present

## 2013-01-18 DIAGNOSIS — Z79899 Other long term (current) drug therapy: Secondary | ICD-10-CM | POA: Diagnosis not present

## 2013-01-18 DIAGNOSIS — Z8042 Family history of malignant neoplasm of prostate: Secondary | ICD-10-CM | POA: Diagnosis not present

## 2013-01-18 DIAGNOSIS — I10 Essential (primary) hypertension: Secondary | ICD-10-CM | POA: Diagnosis not present

## 2013-01-18 DIAGNOSIS — Z7982 Long term (current) use of aspirin: Secondary | ICD-10-CM | POA: Diagnosis not present

## 2013-01-18 DIAGNOSIS — C61 Malignant neoplasm of prostate: Secondary | ICD-10-CM | POA: Diagnosis not present

## 2013-01-18 DIAGNOSIS — Z21 Asymptomatic human immunodeficiency virus [HIV] infection status: Secondary | ICD-10-CM | POA: Diagnosis not present

## 2013-01-18 DIAGNOSIS — Z85038 Personal history of other malignant neoplasm of large intestine: Secondary | ICD-10-CM | POA: Diagnosis not present

## 2013-01-18 LAB — PROTIME-INR
INR: 1.08 (ref 0.00–1.49)
Prothrombin Time: 13.8 seconds (ref 11.6–15.2)

## 2013-01-18 LAB — COMPREHENSIVE METABOLIC PANEL
ALT: 13 U/L (ref 0–53)
AST: 16 U/L (ref 0–37)
Albumin: 3.7 g/dL (ref 3.5–5.2)
Alkaline Phosphatase: 81 U/L (ref 39–117)
BUN: 9 mg/dL (ref 6–23)
CO2: 29 mEq/L (ref 19–32)
Calcium: 9.2 mg/dL (ref 8.4–10.5)
Chloride: 102 mEq/L (ref 96–112)
Creatinine, Ser: 0.93 mg/dL (ref 0.50–1.35)
GFR calc Af Amer: >90 mL/min (ref >90)
GFR calc non Af Amer: 82 mL/min — ABNORMAL LOW (ref >90)
Glucose, Bld: 88 mg/dL (ref 70–99)
Potassium: 3.6 mEq/L (ref 3.5–5.1)
Sodium: 139 mEq/L (ref 135–145)
Total Bilirubin: 0.2 mg/dL — ABNORMAL LOW (ref 0.3–1.2)
Total Protein: 7.5 g/dL (ref 6.0–8.3)

## 2013-01-18 LAB — CBC
HCT: 39.5 % (ref 39.0–52.0)
Hemoglobin: 13.2 g/dL (ref 13.0–17.0)
MCH: 28.3 pg (ref 26.0–34.0)
MCHC: 33.4 g/dL (ref 30.0–36.0)
MCV: 84.6 fL (ref 78.0–100.0)
Platelets: 251 10*3/uL (ref 150–400)
RBC: 4.67 MIL/uL (ref 4.22–5.81)
RDW: 15.1 % (ref 11.5–15.5)
WBC: 7.5 10*3/uL (ref 4.0–10.5)

## 2013-01-18 LAB — APTT: aPTT: 30 seconds (ref 24–37)

## 2013-01-18 NOTE — Progress Notes (Signed)
NPO AFTER MN. ARRIVES AT 0615. CURRENT EKG, CXR, AND LAB RESULTS IN CHART AND EPIC. WILL DO FLEET ENEMA AM OF SURG.

## 2013-01-18 NOTE — Progress Notes (Signed)
01/18/13 1219  OBSTRUCTIVE SLEEP APNEA  Have you ever been diagnosed with sleep apnea through a sleep study? No  Do you snore loudly (loud enough to be heard through closed doors)?  1  Do you often feel tired, fatigued, or sleepy during the daytime? 0  Has anyone observed you stop breathing during your sleep? 0  Do you have, or are you being treated for high blood pressure? 1  BMI more than 35 kg/m2? 0  Age over 73 years old? 1  Gender: 1  Obstructive Sleep Apnea Score 4  Score 4 or greater  Results sent to PCP

## 2013-01-19 ENCOUNTER — Telehealth: Payer: Self-pay | Admitting: *Deleted

## 2013-01-19 DIAGNOSIS — C61 Malignant neoplasm of prostate: Secondary | ICD-10-CM | POA: Diagnosis not present

## 2013-01-19 NOTE — Telephone Encounter (Signed)
CALLED PATIENT TO REMIND OF PROCEDURE FOR 01-20-13, CONFIRMED PROCEDURE WITH PATIENT.

## 2013-01-19 NOTE — Anesthesia Preprocedure Evaluation (Addendum)
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 on medications + Peripheral Vascular Disease Rhythm:Regular Rate:Normal     Neuro/Psych PSYCHIATRIC DISORDERS Depression CVA, No Residual Symptoms    GI/Hepatic negative GI ROS, (+) Hepatitis -, B  Endo/Other  negative endocrine ROS  Renal/GU negative Renal ROS     Musculoskeletal negative musculoskeletal ROS (+)   Abdominal (+) - obese,   Peds  Hematology negative hematology ROS (+) HIV,   Anesthesia Other Findings   Reproductive/Obstetrics                          Anesthesia Physical Anesthesia Plan  ASA: III  Anesthesia Plan: General   Post-op Pain Management:    Induction: Intravenous  Airway Management Planned: LMA  Additional Equipment:   Intra-op Plan:   Post-operative Plan: Extubation in OR  Informed Consent: I have reviewed the patients History and Physical, chart, labs and discussed the procedure including the risks, benefits and alternatives for the proposed anesthesia with the patient or authorized representative who has indicated his/her understanding and acceptance.   Dental advisory given  Plan Discussed with: CRNA  Anesthesia Plan Comments:        Anesthesia Quick Evaluation

## 2013-01-20 ENCOUNTER — Ambulatory Visit (HOSPITAL_BASED_OUTPATIENT_CLINIC_OR_DEPARTMENT_OTHER)
Admission: RE | Admit: 2013-01-20 | Discharge: 2013-01-20 | Disposition: A | Discharge status: Home / Self Care | Payer: Medicare Other | Source: Ambulatory Visit | Attending: Urology | Admitting: Urology

## 2013-01-20 ENCOUNTER — Ambulatory Visit (HOSPITAL_BASED_OUTPATIENT_CLINIC_OR_DEPARTMENT_OTHER): Payer: Medicare Other | Admitting: Anesthesiology

## 2013-01-20 ENCOUNTER — Ambulatory Visit (HOSPITAL_COMMUNITY): Payer: Medicare Other

## 2013-01-20 ENCOUNTER — Encounter (HOSPITAL_BASED_OUTPATIENT_CLINIC_OR_DEPARTMENT_OTHER): Payer: Self-pay | Admitting: *Deleted

## 2013-01-20 ENCOUNTER — Encounter (HOSPITAL_BASED_OUTPATIENT_CLINIC_OR_DEPARTMENT_OTHER)
Admission: RE | Disposition: A | Discharge status: Home / Self Care | Payer: Self-pay | Source: Ambulatory Visit | Attending: Urology

## 2013-01-20 ENCOUNTER — Encounter (HOSPITAL_BASED_OUTPATIENT_CLINIC_OR_DEPARTMENT_OTHER): Payer: Self-pay | Admitting: Anesthesiology

## 2013-01-20 DIAGNOSIS — C61 Malignant neoplasm of prostate: Secondary | ICD-10-CM | POA: Diagnosis not present

## 2013-01-20 DIAGNOSIS — I10 Essential (primary) hypertension: Secondary | ICD-10-CM | POA: Diagnosis not present

## 2013-01-20 DIAGNOSIS — Z21 Asymptomatic human immunodeficiency virus [HIV] infection status: Secondary | ICD-10-CM | POA: Diagnosis not present

## 2013-01-20 DIAGNOSIS — Z8042 Family history of malignant neoplasm of prostate: Secondary | ICD-10-CM | POA: Insufficient documentation

## 2013-01-20 DIAGNOSIS — Z8673 Personal history of transient ischemic attack (TIA), and cerebral infarction without residual deficits: Secondary | ICD-10-CM | POA: Insufficient documentation

## 2013-01-20 DIAGNOSIS — Z79899 Other long term (current) drug therapy: Secondary | ICD-10-CM | POA: Insufficient documentation

## 2013-01-20 DIAGNOSIS — Z85038 Personal history of other malignant neoplasm of large intestine: Secondary | ICD-10-CM | POA: Insufficient documentation

## 2013-01-20 DIAGNOSIS — Z7982 Long term (current) use of aspirin: Secondary | ICD-10-CM | POA: Insufficient documentation

## 2013-01-20 HISTORY — DX: Personal history of other infectious and parasitic diseases: Z86.19

## 2013-01-20 HISTORY — DX: Depression, unspecified: F32.A

## 2013-01-20 HISTORY — DX: Major depressive disorder, single episode, unspecified: F32.9

## 2013-01-20 HISTORY — DX: Personal history of tuberculosis: Z86.11

## 2013-01-20 HISTORY — PX: RADIOACTIVE SEED IMPLANT: SHX5150

## 2013-01-20 SURGERY — INSERTION, RADIATION SOURCE, PROSTATE
Anesthesia: General | Site: Prostate | Wound class: Clean

## 2013-01-20 MED ORDER — LIDOCAINE HCL (CARDIAC) 20 MG/ML IV SOLN
INTRAVENOUS | Status: DC | PRN
  Administered 2013-01-20: 80 mg via INTRAVENOUS

## 2013-01-20 MED ORDER — ONDANSETRON HCL 4 MG/2ML IJ SOLN
INTRAMUSCULAR | Status: DC | PRN
  Administered 2013-01-20: 4 mg via INTRAVENOUS

## 2013-01-20 MED ORDER — PROPOFOL 10 MG/ML IV BOLUS
INTRAVENOUS | Status: DC | PRN
  Administered 2013-01-20: 30 mg via INTRAVENOUS
  Administered 2013-01-20: 150 mg via INTRAVENOUS
  Administered 2013-01-20: 20 mg via INTRAVENOUS

## 2013-01-20 MED ORDER — HYDROCODONE-ACETAMINOPHEN 10-325 MG PO TABS: 1.0000 | ORAL_TABLET | Freq: Four times a day (QID) | ORAL | Status: DC | PRN

## 2013-01-20 MED ORDER — IOHEXOL 350 MG/ML SOLN
INTRAVENOUS | Status: DC | PRN
  Administered 2013-01-20: 7 mL

## 2013-01-20 MED ORDER — HYDROMORPHONE HCL PF 1 MG/ML IJ SOLN
0.2500 mg | INTRAMUSCULAR | Status: DC | PRN
  Filled 2013-01-20: qty 1

## 2013-01-20 MED ORDER — LACTATED RINGERS IV SOLN
INTRAVENOUS | Status: DC
  Administered 2013-01-20 (×2): via INTRAVENOUS
  Filled 2013-01-20: qty 1000

## 2013-01-20 MED ORDER — STERILE WATER FOR IRRIGATION IR SOLN
Status: DC | PRN
  Administered 2013-01-20: 3000 mL via INTRAVESICAL

## 2013-01-20 MED ORDER — PROMETHAZINE HCL 25 MG/ML IJ SOLN
6.2500 mg | INTRAMUSCULAR | Status: DC | PRN
  Filled 2013-01-20: qty 1

## 2013-01-20 MED ORDER — DEXAMETHASONE SODIUM PHOSPHATE 4 MG/ML IJ SOLN
INTRAMUSCULAR | Status: DC | PRN
  Administered 2013-01-20: 10 mg via INTRAVENOUS

## 2013-01-20 MED ORDER — OXYCODONE HCL 5 MG PO TABS
5.0000 mg | ORAL_TABLET | Freq: Once | ORAL | Status: DC | PRN
  Filled 2013-01-20: qty 1

## 2013-01-20 MED ORDER — CIPROFLOXACIN IN D5W 400 MG/200ML IV SOLN
400.0000 mg | INTRAVENOUS | Status: AC
  Administered 2013-01-20: 400 mg via INTRAVENOUS
  Filled 2013-01-20: qty 200

## 2013-01-20 MED ORDER — FLEET ENEMA 7-19 GM/118ML RE ENEM
1.0000 | ENEMA | Freq: Once | RECTAL | Status: DC
  Filled 2013-01-20: qty 1

## 2013-01-20 MED ORDER — FENTANYL CITRATE 0.05 MG/ML IJ SOLN
INTRAMUSCULAR | Status: DC | PRN
  Administered 2013-01-20 (×3): 50 ug via INTRAVENOUS

## 2013-01-20 MED ORDER — OXYCODONE HCL 5 MG/5ML PO SOLN
5.0000 mg | Freq: Once | ORAL | Status: DC | PRN
  Filled 2013-01-20: qty 5

## 2013-01-20 MED ORDER — MEPERIDINE HCL 25 MG/ML IJ SOLN
6.2500 mg | INTRAMUSCULAR | Status: DC | PRN
  Filled 2013-01-20: qty 1

## 2013-01-20 MED ORDER — MIDAZOLAM HCL 5 MG/5ML IJ SOLN
INTRAMUSCULAR | Status: DC | PRN
  Administered 2013-01-20: 2 mg via INTRAVENOUS

## 2013-01-20 MED ORDER — CIPROFLOXACIN HCL 500 MG PO TABS: 500.0000 mg | ORAL_TABLET | Freq: Two times a day (BID) | ORAL | Status: DC

## 2013-01-20 SURGICAL SUPPLY — 28 items
BAG URINE DRAINAGE (UROLOGICAL SUPPLIES) ×2 IMPLANT
BLADE SURG ROTATE 9660 (MISCELLANEOUS) ×2 IMPLANT
CATH FOLEY 2WAY SLVR  5CC 16FR (CATHETERS) ×2
CATH FOLEY 2WAY SLVR 5CC 16FR (CATHETERS) ×2 IMPLANT
CATH ROBINSON RED A/P 20FR (CATHETERS) ×2 IMPLANT
CLOTH BEACON ORANGE TIMEOUT ST (SAFETY) ×2 IMPLANT
COVER MAYO STAND STRL (DRAPES) ×2 IMPLANT
COVER TABLE BACK 60X90 (DRAPES) ×2 IMPLANT
DRSG TEGADERM 4X4.75 (GAUZE/BANDAGES/DRESSINGS) ×2 IMPLANT
DRSG TEGADERM 8X12 (GAUZE/BANDAGES/DRESSINGS) ×2 IMPLANT
GAUZE SPONGE 4X4 12PLY STRL LF (GAUZE/BANDAGES/DRESSINGS) ×1 IMPLANT
GLOVE BIO SURGEON STRL SZ 6.5 (GLOVE) ×1 IMPLANT
GLOVE BIO SURGEON STRL SZ7.5 (GLOVE) IMPLANT
GLOVE BIO SURGEON STRL SZ8 (GLOVE) ×4 IMPLANT
GLOVE ECLIPSE 8.0 STRL XLNG CF (GLOVE) ×4 IMPLANT
GLOVE INDICATOR 7.0 STRL GRN (GLOVE) ×1 IMPLANT
GOWN STRL NON-REIN LRG LVL3 (GOWN DISPOSABLE) ×1 IMPLANT
GOWN STRL REIN XL XLG (GOWN DISPOSABLE) ×2 IMPLANT
GOWN XL W/COTTON TOWEL STD (GOWNS) ×2 IMPLANT
HOLDER FOLEY CATH W/STRAP (MISCELLANEOUS) ×2 IMPLANT
IV NS IRRIG 3000ML ARTHROMATIC (IV SOLUTION) IMPLANT
IV WATER IRR. 1000ML (IV SOLUTION) ×2 IMPLANT
PACK CYSTOSCOPY (CUSTOM PROCEDURE TRAY) ×2 IMPLANT
SYRINGE 10CC LL (SYRINGE) ×2 IMPLANT
UNDERPAD 30X30 INCONTINENT (UNDERPADS AND DIAPERS) ×4 IMPLANT
WATER STERILE IRR 3000ML UROMA (IV SOLUTION) ×1 IMPLANT
WATER STERILE IRR 500ML POUR (IV SOLUTION) ×2 IMPLANT
radioactive seeds ×79 IMPLANT

## 2013-01-20 NOTE — H&P (Signed)
History of Present Illness   Elevated PSA: He was found to have a PSA of 10.81 in 4/14.  His brother was diagnosed with prostate cancer. He was noted to have induration of the right lobe on DRE. TRUS/Bx 09/22/12: Prostate vol. - 53cc. Pathology:  Interval Hx:   Past Medical History Problems  1. History of  Colon Cancer V10.05 2. History of  Hepatitis 573.3 3. History of  HIV Infection 042 4. History of  Hypertension 401.9 5. History of  Stroke Syndrome 436  Surgical History Problems  1. History of  Colon Surgery  Current Meds 1. Atripla 600-200-300 MG Oral Tablet; Therapy: (Recorded:24Apr2014) to 2. Bayer Aspirin TABS; Therapy: (Recorded:24Apr2014) to 3. Hydrochlorothiazide TABS; Therapy: (Recorded:24Apr2014) to  Allergies Medication  1. No Known Drug Allergies  Family History Problems  1. Family history of  Family Health Status Number Of Children 2 sons 2. Fraternal history of  Prostate Cancer V16.42  Social History Problems  1. Alcohol Use 2. Former Smoker V15.82 1/2 pack for 20 years, quit 12 years ago Denied  3. History of  Caffeine Use   Review of Systems Genitourinary, constitutional, skin, eye, otolaryngeal, hematologic/lymphatic, cardiovascular, pulmonary, endocrine, musculoskeletal, gastrointestinal, neurological and psychiatric system(s) were reviewed and pertinent findings if present are noted.  Genitourinary: nocturia.  Constitutional: night sweats and recent weight loss.  Integumentary: skin rash/lesion and pruritus.  ENT: sore throat.  Respiratory: cough.  Endocrine: polydipsia.    Vitals Vital Signs  BMI Calculated: 23.47 BSA Calculated: 1.75 Height: 5 ft 6 in Weight: 146 lb  Blood Pressure: 142 / 84 Temperature: 96.6 F Heart Rate: 65  Physical Exam Constitutional: Well nourished and well developed . No acute distress.  ENT:. The ears and nose are normal in appearance.  Neck: The appearance of the neck is normal and no neck mass is  present.  Pulmonary: No respiratory distress and normal respiratory rhythm and effort.  Cardiovascular: Heart rate and rhythm are normal . No peripheral edema.  Abdomen: lower midline incision site(s) well healed. The abdomen is soft and nontender. No masses are palpated. No CVA tenderness. No hernias are palpable. No hepatosplenomegaly noted.  Rectal: Rectal exam demonstrates normal sphincter tone, no tenderness and no masses. The prostate has no nodularity, is indurated involving the right, mid aspect of the prostate and is not tender. The left seminal vesicle is nonpalpable. The right seminal vesicle is nonpalpable. The perineum is normal on inspection.  Genitourinary: Examination of the penis demonstrates no discharge, no masses, no lesions and a normal meatus. The scrotum is without lesions. The right epididymis is palpably normal and non-tender. The left epididymis is palpably normal and non-tender. The right testis is non-tender and without masses. The left testis is non-tender and without masses.  Lymphatics: The femoral and inguinal nodes are not enlarged or tender.  Skin: Normal skin turgor, no visible rash and no visible skin lesions.  Neuro/Psych:. Mood and affect are appropriate.   Results/Data  Pertinent table results:The probability of organ confined disease is 66%, the probability of extracapsular extension is 21%, the probability of seminal vesicle invasion is 9% and there is a 3.4% the probability of lymph node involvement. His progression free probability with brachytherapy at 5 years is 68%.     Assessment  I went over his pathology report with him today as well as the significance of his Gleason score, number and location of cores positive and percent of cores positive. We then discussed his Partin table results in detail and  the significance of these predictions as far as prognosis and need for further workup are concerned. I then discussed with him the various options available  including active surveillance and treatment for cure such as radiation and surgery. We briefly discussed the forms of radiation available. I also gave him written information outlining the disease, its workup and the options for treatment for him to review further.  At this point since he is a surgical candidate because of his age I'm going to schedule him for an appointment with the radiation oncologist as active surveillance is probably not in his best interest over the long-term.   Plan: I-125 seed implant.

## 2013-01-20 NOTE — Anesthesia Postprocedure Evaluation (Signed)
Anesthesia Post Note  Patient: Charles Daniel  Procedure(s) Performed: Procedure(s) (LRB): RADIOACTIVE SEED IMPLANT (N/A)  Anesthesia type: General  Patient location: PACU  Post pain: Pain level controlled  Post assessment: Post-op Vital signs reviewed  Last Vitals: BP 160/89  Pulse 62  Temp(Src) 36.5 C (Oral)  Resp 12  Ht 5\' 6"  (1.676 m)  Wt 138 lb (62.596 kg)  BMI 22.28 kg/m2  SpO2 99%  Post vital signs: Reviewed  Level of consciousness: sedated  Complications: No apparent anesthesia complications

## 2013-01-20 NOTE — Transfer of Care (Signed)
Immediate Anesthesia Transfer of Care Note  Patient: Charles Daniel  Procedure(s) Performed: Procedure(s): RADIOACTIVE SEED IMPLANT (N/A)  Patient Location: PACU  Anesthesia Type:General  Level of Consciousness: sedated  Airway & Oxygen Therapy: Patient Spontanous Breathing and Patient connected to face mask oxygen  Post-op Assessment: Report given to PACU RN  Post vital signs: Reviewed and stable  Complications: No apparent anesthesia complications

## 2013-01-20 NOTE — Progress Notes (Signed)
Pt requested note for work.  [facility] called.  Spoke w Dr. Margrett Rud nurse and relayed request.  Dr. Vernie Ammons unable to write note  at this time.  Office will call pt at home for fax information.  Pt and family informed and Verbalized their understanding

## 2013-01-20 NOTE — Anesthesia Procedure Notes (Signed)
Procedure Name: LMA Insertion Date/Time: 01/20/2013 8:00 AM Performed by: Maris Berger T Pre-anesthesia Checklist: Patient identified, Emergency Drugs available, Suction available and Patient being monitored Patient Re-evaluated:Patient Re-evaluated prior to inductionOxygen Delivery Method: Circle System Utilized Preoxygenation: Pre-oxygenation with 100% oxygen Intubation Type: IV induction Ventilation: Mask ventilation without difficulty LMA: LMA inserted LMA Size: 4.0 Number of attempts: 1 Airway Equipment and Method: bite block Placement Confirmation: positive ETCO2 Tube secured with: Tape Dental Injury: Teeth and Oropharynx as per pre-operative assessment

## 2013-01-20 NOTE — Op Note (Signed)
PATIENT:  Charles Daniel  PRE-OPERATIVE DIAGNOSIS:  Adenocarcinoma of the prostate  POST-OPERATIVE DIAGNOSIS:  Same  PROCEDURE:  Procedure(s): 1. I-125 radioactive seed implantation 2. Cystoscopy  SURGEON:  Surgeon(s): Garnett Farm  Radiation oncologist: Dr. Margaretmary Dys  ANESTHESIA:  General  EBL:  Minimal  DRAINS: 16 French Foley catheter  INDICATION: Charles Daniel is a 72 year old male with biopsy-proven adenocarcinoma of the prostate. He had a PSA of 10.81, a family history of a brother having been diagnosed with prostate cancer and a prostate volume at the time of ultrasound of 53 cc. The biopsy was positive in 3/12 cores. One core contained Gleason 3+4 = 7 and the remaining 2 cores contained Gleason 6 adenocarcinoma. We discussed the treatment options and he has elected to proceed with radioactive seed implantation. He would be considered an excellent candidate for this form of therapy with curative intent.  Description of procedure: After informed consent the patient was brought to the major OR, placed on the table and administered general anesthesia. He was then moved to the modified lithotomy position with his perineum perpendicular to the floor. His perineum and genitalia were then sterilely prepped. An official timeout was then performed. A 16 French Foley catheter was then placed in the bladder and filled with dilute contrast, a rectal tube was placed in the rectum and the transrectal ultrasound probe was placed in the rectum and affixed to the stand. He was then sterilely draped.  Real time ultrasonography was used along with the seed planning software spot-pro version 3.1-00. This was used to develop the seed plan including the number of needles as well as number of seeds required for complete and adequate coverage. Real-time ultrasonography was then used along with the previously developed plan and the Nucletron device to implant a total of 79 seeds using 26 needles. This  proceeded without difficulty or complication.  A Foley catheter was then removed as well as the transrectal ultrasound probe and rectal probe. Flexible cystoscopy was then performed using the 17 French flexible scope which revealed a normal urethra throughout its length down to the sphincter which appeared intact. The prostatic urethra revealed bilobar hypertrophy but no evidence of obstruction, seeds, spacers or lesions. The bladder was then entered and fully and systematically inspected. The ureteral orifices were noted to be of normal configuration and position. The mucosa revealed no evidence of tumors. There were also no stones identified within the bladder. I noted no seeds or spacers on the floor of the bladder and retroflexion of the scope revealed no seeds protruding from the base of the prostate.  The cystoscope was then removed and a new 16 French Foley catheter was then inserted and the balloon was filled with 10 cc of sterile water. This was connected to closed system drainage and the patient was awakened and taken to recovery room in stable and satisfactory condition. He tolerated procedure well and there were no intraoperative complications.

## 2013-01-22 NOTE — Procedures (Signed)
  Radiation Oncology         [phone]) [phone] ________________________________  Name: Mabel Unrein  MRN: [id number]  Date: 01/20/13   DOB: 12/25/1940       Prostate Seed Implant  WJ:[id number] Sharen Hones, MD  No ref. provider found  DIAGNOSIS: 72 y.o. gentleman with stage T1c adenocarcinoma of the prostate with a Gleason's score of 3+4 and a PSA of 10.81  PROCEDURE: Insertion of radioactive I-125 seeds into the prostate gland.  RADIATION DOSE: 145 Gy, definitive therapy.  TECHNIQUE: Lavert Matousek was brought to the operating room with the urologist. He was placed in the dorsolithotomy position. He was catheterized and a rectal tube was inserted. The perineum was shaved, prepped and draped. The ultrasound probe was then introduced into the rectum to see the prostate gland.  TREATMENT DEVICE: A needle grid was attached to the ultrasound probe stand and anchor needles were placed.  3D PLANNING: The prostate was imaged in 3D using a sagittal sweep of the prostate probe. These images were transferred to the planning computer. There, the prostate, urethra and rectum were defined on each axial reconstructed image. Then, the software created an optimized plan and a few seed positions were adjusted. The quality of the plan was evaluated in terms of dose volume histograms and isodose reconstructions.  Then the accepted plan was uploaded to the seed Selectron afterloading unit.  SPECIAL TREATMENT PROCEDURE/SUPERVISION AND HANDLING: The Nucletron FIRST system was used to place the needles under sagittal guidance. A total of 26 needles were used to deposit 79 seeds in the prostate gland. The individual seed activity was 0.459 mCi for a total implant activity of 36.261 mCi.  COMPLEX SIMULATION: At the end of the procedure, an anterior radiograph of the pelvis was obtained to document seed positioning and count. Cystoscopy was performed to check the urethra and bladder.  MICRODOSIMETRY: At the end of the  procedure, the patient was emitting 0.068 mrem/hr at 1 meter. Accordingly, he was considered safe for hospital discharge.  PLAN: The patient will return to the radiation oncology clinic for post implant CT dosimetry in three weeks.   ________________________________  Artist Pais Kathrynn Running, M.D.

## 2013-01-23 ENCOUNTER — Encounter (HOSPITAL_BASED_OUTPATIENT_CLINIC_OR_DEPARTMENT_OTHER): Payer: Self-pay | Admitting: Urology

## 2013-01-30 ENCOUNTER — Telehealth: Payer: Self-pay | Admitting: *Deleted

## 2013-01-30 NOTE — Telephone Encounter (Signed)
CALLED PATIENT TO INFORM THAT APPTS. HAVE BEEN MOVED FROM 02-09-13 TO 02-10-13, POST SEED APPT. AT 2 PM AND FU VISIT ON 02-10-13 AT 2:15 PM, NO ANSWER WILL TRY LATER

## 2013-01-30 NOTE — Telephone Encounter (Signed)
Called patient to inform that post seed appts. Have been moved to 02-10-13, spoke with patient and he is aware of these appts.

## 2013-02-02 ENCOUNTER — Encounter: Payer: Self-pay | Admitting: Family Medicine

## 2013-02-02 ENCOUNTER — Ambulatory Visit (INDEPENDENT_AMBULATORY_CARE_PROVIDER_SITE_OTHER): Payer: Medicare Other | Admitting: Family Medicine

## 2013-02-02 VITALS — BP 140/80 | HR 84 | Temp 98.1°F | Wt 142.8 lb

## 2013-02-02 DIAGNOSIS — C61 Malignant neoplasm of prostate: Secondary | ICD-10-CM | POA: Diagnosis not present

## 2013-02-02 DIAGNOSIS — B20 Human immunodeficiency virus [HIV] disease: Secondary | ICD-10-CM | POA: Diagnosis not present

## 2013-02-02 DIAGNOSIS — I10 Essential (primary) hypertension: Secondary | ICD-10-CM

## 2013-02-02 DIAGNOSIS — I6350 Cerebral infarction due to unspecified occlusion or stenosis of unspecified cerebral artery: Secondary | ICD-10-CM | POA: Diagnosis not present

## 2013-02-02 NOTE — Progress Notes (Signed)
  Subjective:    Patient ID: Charles Daniel, male    DOB: 1940-11-26, 72 y.o.   MRN: [id number]  HPI CC: 6 mo f/u  Recent dx prostate cancer - seeds implanted last month.  Doing well.  No residual pain.  To see oncologist 02/10/2013.  No pain, fevers/chills, cough. Did have chest cold recently - treated with robitussin.  H/o CVA 2007 per patient, on aspirin since stroke. Declines flu shot - will receive at next ID appt.  Past Medical History  Diagnosis Date  . HIV infection     DX 1995--  MONITORED BY INFECTIOUS DISEASE (DR CAMPBELL)  . History of colon cancer     2007 --  S/P RECTOSIGMOID COLECTOMY--  NO CHEMORADIATION--  NO RECURRENCE  . Hypertension   . History of CVA (cerebrovascular accident)     2003-  RIGHT MIDDLE CVA---   NO RESIDUAL  . Bilateral hydrocele 2012  . Diverticulosis 2013    by colonoscopy  . Prostate carcinoma DX  09/2012    T1c, brachytherapy/seed implant Vernie Ammons, [place])  . History of hepatitis B     REMOTE AND INACTIVE  PER DOCUMENTATION  . History of syphilis     SECONDARY SYPHILITIS TX'D IN 1998  PER DOCUMENTATION  . History of tuberculosis     LATENT TB  TX'D X12  MONTHS IN 1995  . Depression      Review of Systems Per HPI    Objective:   Physical Exam  Nursing note and vitals reviewed. Constitutional: He appears well-developed and well-nourished. No distress.  thin  HENT:  Head: Normocephalic and atraumatic.  Mouth/Throat: Oropharynx is clear and moist. No oropharyngeal exudate.  Upper/lower dentures  Eyes: Conjunctivae and EOM are normal. Pupils are equal, round, and reactive to light. No scleral icterus.  Neck: Normal range of motion. Neck supple.  Cardiovascular: Normal rate, regular rhythm, normal heart sounds and intact distal pulses.   No murmur heard. Pulmonary/Chest: Effort normal and breath sounds normal. No respiratory distress. He has no wheezes. He has no rales.  Musculoskeletal: He exhibits no edema.  Lymphadenopathy:   He has no cervical adenopathy.  Skin: Skin is warm and dry.  Psychiatric: He has a normal mood and affect.       Assessment & Plan:

## 2013-02-02 NOTE — Assessment & Plan Note (Signed)
Doing remarkably well after seed implants last month.  Will f/u with onc.

## 2013-02-02 NOTE — Assessment & Plan Note (Signed)
Chronic, stable on hctz 50mg  Lab Results  Component Value Date   K 3.6 01/18/2013

## 2013-02-02 NOTE — Assessment & Plan Note (Signed)
rec start aspirin 162mg  daily instead of 325mg . Consider decreasing to 81mg  daily. Asxs. No aspirin prior to CVA.

## 2013-02-02 NOTE — Assessment & Plan Note (Signed)
Stable. Viral load undetectable. continue [place]

## 2013-02-02 NOTE — Patient Instructions (Addendum)
Let's decrease aspirin to 1/2 tablet of 325mg  daily. Good to see you today. Return in 6-8 months for medicare wellness exam.

## 2013-02-09 ENCOUNTER — Ambulatory Visit: Payer: Medicare Other | Admitting: Radiation Oncology

## 2013-02-09 ENCOUNTER — Telehealth: Payer: Self-pay | Admitting: *Deleted

## 2013-02-09 NOTE — Telephone Encounter (Signed)
CALLED PATIENT TO REMIND OF APPTS. FOR 02-10-13, SPOKE WITH PATIENT AND HE IS AWARE OF THESE APPTS.

## 2013-02-10 ENCOUNTER — Encounter: Payer: Self-pay | Admitting: Radiation Oncology

## 2013-02-10 ENCOUNTER — Ambulatory Visit
Admission: RE | Admit: 2013-02-10 | Discharge: 2013-02-10 | Disposition: A | Discharge status: Home / Self Care | Payer: Medicare Other | Source: Ambulatory Visit | Attending: Radiation Oncology | Admitting: Radiation Oncology

## 2013-02-10 VITALS — BP 154/88 | HR 71 | Temp 98.3°F | Resp 20 | Wt 138.0 lb

## 2013-02-10 DIAGNOSIS — C61 Malignant neoplasm of prostate: Secondary | ICD-10-CM

## 2013-02-10 NOTE — Progress Notes (Signed)
Pt denies pain, fatigue, loss of appetite, bowel issues. IPSS score 15 today w/sensation of not emptying bladder, urinary frequency, stream starts & stops, nocturia x 2.

## 2013-02-10 NOTE — Addendum Note (Signed)
Encounter addended by: Oneita Hurt, MD on: 02/10/2013  6:10 PM<BR>     Documentation filed: Notes Section

## 2013-02-10 NOTE — Progress Notes (Addendum)
  Radiation Oncology         [phone]) [phone] ________________________________  Name: Charles Daniel  MRN: [id number]  Date: 02/10/2013  DOB: [birth year]  COMPLEX SIMULATION NOTE  NARRATIVE:  The patient was brought to the CT Simulation planning suite today following prostate seed implantation approximately one month ago.  Identity was confirmed.  All relevant records and images related to the planned course of therapy were reviewed.  Then, the patient was set-up supine.  CT images were obtained.  The CT images were loaded into the planning software.  Then the prostate and rectum were contoured.  Treatment planning then occurred.  The implanted iodine 125 seeds were identified by the physics staff for projection of radiation distribution  I have requested : 3D Simulation  I have requested a DVH of the following structures: Prostate and rectum.    ________________________________  Artist Pais Kathrynn Running, M.D.

## 2013-02-10 NOTE — Progress Notes (Signed)
  Radiation Oncology         [phone]) [phone] ________________________________  Name: Charles Daniel MRN: [id number]  Date: 02/10/2013  DOB: 11-26-1940  Follow-Up Visit Note  CC: [name], MD  Garnett Farm, MD  Diagnosis:   72 y.o. gentleman with stage T1c adenocarcinoma of the prostate with a Gleason's score of 3+4 and a PSA of 10.81  Interval Since Last Radiation:  4  weeks  Narrative:  The patient returns today for routine follow-up.  He is complaining of increased urinary frequency and urinary hesitation symptoms. He filled out a questionnaire regarding urinary function today providing and overall IPSS score of 15 characterizing his symptoms as moderate.  His pre-implant score was 14. He denies any bowel symptoms.  ALLERGIES:  has No Known Allergies.  Meds: Current Outpatient Prescriptions  Medication Sig Dispense Refill  . aspirin 325 MG tablet Take 162 mg by mouth daily.       Marland Kitchen efavirenz-emtricitabine-tenofovir (ATRIPLA) 600-200-300 MG per tablet Take 1 tablet by mouth daily.  30 tablet  12  . hydrochlorothiazide (HYDRODIURIL) 50 MG tablet TAKE 1 TABLET (50 MG TOTAL) BY MOUTH DAILY.  30 tablet  7  . HYDROcodone-acetaminophen (NORCO) 10-325 MG per tablet Take 1 tablet by mouth every 6 (six) hours as needed for pain.  30 tablet  0  . Multiple Vitamins-Minerals (CENTRUM SILVER) tablet Take 1 tablet by mouth daily.  90 tablet  3   No current facility-administered medications for this encounter.    Physical Findings: The patient is in no acute distress. Patient is alert and oriented.  weight is 138 lb (62.596 kg). His temperature is 98.3 F (36.8 C). His blood pressure is 154/88 and his pulse is 71. His respiration is 20. Marland Kitchen  No significant changes.  Lab Findings: Lab Results  Component Value Date   WBC 7.5 01/18/2013   HGB 13.2 01/18/2013   HCT 39.5 01/18/2013   MCV 84.6 01/18/2013   PLT 251 01/18/2013    Radiographic Findings:  Patient underwent CT imaging in our  clinic for post implant dosimetry. The CT appears to demonstrate an adequate distribution of radioactive seeds throughout the prostate gland. There no seeds in her near the rectum. I suspect the final radiation plan and dosimetry will show appropriate coverage of the prostate gland.   Impression: The patient is recovering from the effects of radiation. His urinary symptoms should gradually improve over the next 4-6 months. We talked about this today. He is encouraged by his improvement already and is otherwise please with his outcome.   Plan: Today, I spent time talking to the patient about his prostate seed implant and resolving urinary symptoms. We also talked about long-term follow-up for prostate cancer following seed implant. He understands that ongoing PSA determinations and digital rectal exams will help perform surveillance to rule out disease recurrence. He understands what to expect with his PSA measures. Patient was also educated today about some of the long-term effects from radiation including a small risk for rectal bleeding and possibly erectile dysfunction. We talked about some of the general management approaches to these potential complications. However, I did encourage the patient to contact our office or return at any point if he has questions or concerns related to his previous radiation and prostate cancer.  _____________________________________  Artist Pais. Kathrynn Running, M.D.

## 2013-03-20 ENCOUNTER — Encounter: Payer: Self-pay | Admitting: Radiation Oncology

## 2013-03-20 DIAGNOSIS — C61 Malignant neoplasm of prostate: Secondary | ICD-10-CM | POA: Diagnosis not present

## 2013-03-22 NOTE — Progress Notes (Signed)
  Radiation Oncology         [phone]) [phone] ________________________________  Name: Charles Daniel  MRN: [id number]  Date: 03/20/2013  DOB: Aug 02, 1940  3-D Planning Note Prostate Brachytherapy  Diagnosis: 72 y.o. gentleman with stage T1c adenocarcinoma of the prostate with a Gleason's score of 3+4 and a PSA of 10.81  Narrative: On a previous date, Charles Daniel returned following prostate seed implantation for post implant planning. He underwent CT scan complex simulation to delineate the three-dimensional structures of the pelvis and demonstrate the radiation distribution.  Since that time, the seed localization, and 3D planning with dose volume histograms have now been completed.  Results:   Prostate Coverage - The dose of radiation delivered to the 90% or more of the prostate gland (D90) was 109.29% of the prescription dose. This exceeds our goal of greater than 90%. Rectal Sparing - The volume of rectal tissue receiving the prescription dose or higher was 1.89 cc. This falls over our thresholds tolerance of 1.0 cc.  This may be related to the extent of rectal filling at the time of post-implant imaging.  The significant gas in the rectum helped contribute to a broader abutment of the posterior prostate and anterior rectum.  Impression: The prostate seed implant appears to show adequate target coverage and appropriate rectal sparing.  Plan:  The patient will continue to follow with urology for ongoing PSA determinations. I would anticipate a high likelihood for local tumor control with small risk for rectal morbidity.  ________________________________  Artist Pais Kathrynn Running, M.D.

## 2013-04-03 ENCOUNTER — Telehealth: Payer: Self-pay | Admitting: *Deleted

## 2013-04-03 NOTE — Telephone Encounter (Signed)
Received a call from Charles Daniel.  He said he received a call from someone telling him his spap has been cancelled.  I called the pharmacy and checked to make sure his prescription has ok.  The confirmed he has 7 more refills.

## 2013-04-03 NOTE — Telephone Encounter (Signed)
I called the [employer] to make sure Charles Daniel has not exceeded his $4000.00 grant.  He still has over $3000.00 available.

## 2013-05-18 ENCOUNTER — Other Ambulatory Visit (INDEPENDENT_AMBULATORY_CARE_PROVIDER_SITE_OTHER): Payer: Medicare Other

## 2013-05-18 DIAGNOSIS — B20 Human immunodeficiency virus [HIV] disease: Secondary | ICD-10-CM | POA: Diagnosis not present

## 2013-05-18 DIAGNOSIS — Z79899 Other long term (current) drug therapy: Secondary | ICD-10-CM | POA: Diagnosis not present

## 2013-05-18 LAB — CBC
HEMATOCRIT: 38.3 % — AB (ref 39.0–52.0)
HEMOGLOBIN: 12.8 g/dL — AB (ref 13.0–17.0)
MCH: 27.8 pg (ref 26.0–34.0)
MCHC: 33.4 g/dL (ref 30.0–36.0)
MCV: 83.1 fL (ref 78.0–100.0)
Platelets: 233 10*3/uL (ref 150–400)
RBC: 4.61 MIL/uL (ref 4.22–5.81)
RDW: 16 % — AB (ref 11.5–15.5)
WBC: 7.9 10*3/uL (ref 4.0–10.5)

## 2013-05-18 LAB — RPR: RPR Ser Ql: REACTIVE — AB

## 2013-05-18 LAB — LIPID PANEL
CHOL/HDL RATIO: 3.5 ratio
Cholesterol: 134 mg/dL (ref 0–200)
HDL: 38 mg/dL — AB (ref >39)
LDL CALC: 79 mg/dL (ref 0–99)
TRIGLYCERIDES: 83 mg/dL (ref <150)
VLDL: 17 mg/dL (ref 0–40)

## 2013-05-18 LAB — RPR TITER

## 2013-05-19 LAB — T.PALLIDUM AB, TOTAL: T pallidum Antibodies (TP-PA): >8 S/CO — ABNORMAL HIGH (ref <0.90)

## 2013-05-21 LAB — HIV-1 RNA QUANT-NO REFLEX-BLD: HIV 1 RNA Quant: <20 copies/mL (ref <20)

## 2013-05-22 LAB — T-HELPER CELL (CD4) - (RCID CLINIC ONLY)
CD4 % Helper T Cell: 47 % (ref 33–55)
CD4 T Cell Abs: 910 /uL (ref 400–2700)

## 2013-05-25 ENCOUNTER — Other Ambulatory Visit: Payer: Medicare Other

## 2013-05-29 DIAGNOSIS — R972 Elevated prostate specific antigen [PSA]: Secondary | ICD-10-CM | POA: Diagnosis not present

## 2013-05-29 DIAGNOSIS — N390 Urinary tract infection, site not specified: Secondary | ICD-10-CM | POA: Diagnosis not present

## 2013-05-29 DIAGNOSIS — N529 Male erectile dysfunction, unspecified: Secondary | ICD-10-CM | POA: Diagnosis not present

## 2013-05-29 DIAGNOSIS — C61 Malignant neoplasm of prostate: Secondary | ICD-10-CM | POA: Diagnosis not present

## 2013-06-01 ENCOUNTER — Encounter: Payer: Self-pay | Admitting: Internal Medicine

## 2013-06-01 ENCOUNTER — Ambulatory Visit (INDEPENDENT_AMBULATORY_CARE_PROVIDER_SITE_OTHER): Payer: Medicare Other | Admitting: Internal Medicine

## 2013-06-01 VITALS — BP 161/91 | HR 84 | Temp 97.4°F | Wt 144.0 lb

## 2013-06-01 DIAGNOSIS — B20 Human immunodeficiency virus [HIV] disease: Secondary | ICD-10-CM | POA: Diagnosis not present

## 2013-06-01 NOTE — Progress Notes (Signed)
Patient ID: Charles Daniel, male   DOB: Jul 03, 1940, 73 y.o.   MRN: [id number]          [facility]  Patient Active Problem List   Diagnosis Date Noted  . 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 HIV DISEASE 05/15/2006    Priority: High  . HYPERTENSION 05/15/2006    Priority: High  . CEREBROVASCULAR ACCIDENT 05/15/2006    Priority: High  . Prostate ca 11/08/2012  . Medicare annual wellness visit, initial 08/02/2012  . 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 WEIGHT LOSS 12/18/2008  . CONSTIPATION 03/16/2008  . TUBERCULOSIS 05/15/2006  . SYPHILIS 05/15/2006  . 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  . Ex-smoker 05/15/2006  . HEPATITIS B, HX OF 05/15/2006  . GASTROINTESTINAL HEMORRHAGE, HX OF 05/15/2006    Patient's Medications  New Prescriptions   No medications on file  Previous Medications   ASPIRIN 325 MG TABLET    Take 162 mg by mouth daily.    EFAVIRENZ-EMTRICITABINE-TENOFOVIR (ATRIPLA) 600-200-300 MG PER TABLET    Take 1 tablet by mouth daily.   HYDROCHLOROTHIAZIDE (HYDRODIURIL) 50 MG TABLET    TAKE 1 TABLET (50 MG TOTAL) BY MOUTH DAILY.   HYDROCODONE-ACETAMINOPHEN (NORCO) 10-325 MG PER TABLET    Take 1 tablet by mouth every 6 (six) hours as needed for pain.   MULTIPLE VITAMINS-MINERALS (CENTRUM SILVER) TABLET    Take 1 tablet by mouth daily.  Modified Medications   No medications on file  Discontinued Medications   No medications on file    Subjective: Charles Daniel is in previous routine visit. He has been able to get a study supply of Atripla and has not missed any doses since his last visit. He has been undergoing radiation seed therapy for localized prostate cancer. He has had some recent discomfort in his left testicle. He has had some difficulty with urinary hesitancy but no dysuria.  Review of Systems: Pertinent items are noted in HPI.  Past Medical History  Diagnosis Date  . HIV infection     DX 1995--   MONITORED BY INFECTIOUS DISEASE (DR Alizae Bechtel)  . History of colon cancer     2007 --  S/P RECTOSIGMOID COLECTOMY--  NO CHEMORADIATION--  NO RECURRENCE  . Hypertension   . History of CVA (cerebrovascular accident)     2003-  RIGHT MIDDLE CVA---   NO RESIDUAL  . Bilateral hydrocele 2012  . Diverticulosis 2013    by colonoscopy  . Prostate carcinoma DX  09/2012    T1c, brachytherapy/seed implant Karsten Ro, [place])  . History of hepatitis B     REMOTE AND INACTIVE  PER DOCUMENTATION  . History of syphilis     SECONDARY SYPHILITIS TX'D IN 1998  PER DOCUMENTATION  . History of tuberculosis     LATENT TB  TX'D X12  MONTHS IN 1995  . Depression     History  Substance Use Topics  . Smoking status: Former Smoker -- 0.30 packs/day for 35 years    Types: Cigarettes    Quit date: 03/04/2009  . Smokeless tobacco: Never Used  . Alcohol Use: No    Family History  Problem Relation Age of Onset  . Cancer Sister     breast  . Cancer Father     unsure  . Cancer Brother 29    prostate, treated with seed implant  . CAD Neg Hx   . Stroke Neg Hx   . Diabetes Neg Hx   . Cancer  Brother     prostate  . Cancer Brother     prostate  . Cancer Daughter     breast  . Cancer Other     prostate    No Known Allergies  Objective: Temp: 97.4 F (36.3 C) (01/29 1532) Temp src: Oral (01/29 1532) BP: 161/91 mmHg (01/29 1532) Pulse Rate: 84 (01/29 1532)  Body mass index is 23.25 kg/(m^2).  General: He is in no distress Oral: No oropharyngeal lesions Skin: No rash Lungs: Clear Cor: Regular S1 and S2 no murmurs  Lab Results Lab Results  Component Value Date   WBC 7.9 05/18/2013   HGB 12.8* 05/18/2013   HCT 38.3* 05/18/2013   MCV 83.1 05/18/2013   PLT 233 05/18/2013    Lab Results  Component Value Date   CREATININE 0.93 01/18/2013   BUN 9 01/18/2013   NA 139 01/18/2013   K 3.6 01/18/2013   CL 102 01/18/2013   CO2 29 01/18/2013    Lab Results  Component Value Date   ALT 13 01/18/2013    AST 16 01/18/2013   ALKPHOS 81 01/18/2013   BILITOT 0.2* 01/18/2013    Lab Results  Component Value Date   CHOL 134 05/18/2013   HDL 38* 05/18/2013   LDLCALC 79 05/18/2013   TRIG 83 05/18/2013   CHOLHDL 3.5 05/18/2013    Lab Results HIV 1 RNA Quant (copies/mL)  Date Value  05/18/2013 <20   09/06/2012 <20   03/08/2012 <20      CD4 T Cell Abs (/uL)  Date Value  05/18/2013 910   09/06/2012 920   03/08/2012 770    RPR: 1:2  Assessment: His HIV infection remains under excellent control. I will continue Atripla.  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 I do not suspect that he has active syphilis at this time and will not retreat him.  Plan: 1. Continue Atripla 2. Followup after lab work in [place] months   Michel Bickers, MD [facility] for [place] [phone] pager   [phone] cell 06/01/2013, 5:08 PM

## 2013-06-10 ENCOUNTER — Other Ambulatory Visit: Payer: Self-pay | Admitting: Internal Medicine

## 2013-09-22 ENCOUNTER — Telehealth: Payer: Self-pay | Admitting: *Deleted

## 2013-09-22 NOTE — Telephone Encounter (Signed)
Patient was informed by pharmacy that his patient assistance for Atripla has expired. He left a message with Pam and was given Pam's extension number to call her on Tuesday 09/26/13 to renew. Myrtis Hopping

## 2013-10-19 ENCOUNTER — Other Ambulatory Visit: Payer: Self-pay | Admitting: *Deleted

## 2013-10-19 NOTE — Telephone Encounter (Signed)
Spoke with Dr. Megan Salon.  Will forward this request to pt's PCP, Dr. Danise Mina.

## 2013-11-07 ENCOUNTER — Other Ambulatory Visit: Payer: Self-pay | Admitting: Internal Medicine

## 2013-11-13 ENCOUNTER — Telehealth: Payer: Self-pay | Admitting: *Deleted

## 2013-11-13 NOTE — Telephone Encounter (Signed)
Voice mail from Charles Daniel.  Returned his call.  No answer

## 2013-11-15 ENCOUNTER — Other Ambulatory Visit (INDEPENDENT_AMBULATORY_CARE_PROVIDER_SITE_OTHER): Payer: Medicare Other

## 2013-11-15 DIAGNOSIS — B20 Human immunodeficiency virus [HIV] disease: Secondary | ICD-10-CM | POA: Diagnosis not present

## 2013-11-15 LAB — COMPREHENSIVE METABOLIC PANEL
ALK PHOS: 87 U/L (ref 39–117)
ALT: 13 U/L (ref 0–53)
AST: 15 U/L (ref 0–37)
Albumin: 4.3 g/dL (ref 3.5–5.2)
BILIRUBIN TOTAL: 0.3 mg/dL (ref 0.2–1.2)
BUN: 8 mg/dL (ref 6–23)
CO2: 27 meq/L (ref 19–32)
Calcium: 9.2 mg/dL (ref 8.4–10.5)
Chloride: 104 mEq/L (ref 96–112)
Creat: 0.99 mg/dL (ref 0.50–1.35)
Glucose, Bld: 75 mg/dL (ref 70–99)
Potassium: 3.6 mEq/L (ref 3.5–5.3)
SODIUM: 139 meq/L (ref 135–145)
TOTAL PROTEIN: 7.4 g/dL (ref 6.0–8.3)

## 2013-11-16 LAB — HIV-1 RNA QUANT-NO REFLEX-BLD: HIV 1 RNA Quant: <20 copies/mL (ref <20)

## 2013-11-16 LAB — T-HELPER CELL (CD4) - (RCID CLINIC ONLY)
CD4 % Helper T Cell: 45 % (ref 33–55)
CD4 T CELL ABS: 980 /uL (ref 400–2700)

## 2013-11-24 DIAGNOSIS — C61 Malignant neoplasm of prostate: Secondary | ICD-10-CM | POA: Diagnosis not present

## 2013-11-28 DIAGNOSIS — C61 Malignant neoplasm of prostate: Secondary | ICD-10-CM | POA: Diagnosis not present

## 2013-12-06 ENCOUNTER — Ambulatory Visit: Payer: Medicare Other | Admitting: Internal Medicine

## 2013-12-19 ENCOUNTER — Encounter: Payer: Self-pay | Admitting: Internal Medicine

## 2013-12-19 ENCOUNTER — Ambulatory Visit (INDEPENDENT_AMBULATORY_CARE_PROVIDER_SITE_OTHER): Payer: Medicare Other | Admitting: Internal Medicine

## 2013-12-19 VITALS — BP 150/83 | HR 73 | Temp 98.5°F | Wt 137.5 lb

## 2013-12-19 DIAGNOSIS — B20 Human immunodeficiency virus [HIV] disease: Secondary | ICD-10-CM | POA: Diagnosis not present

## 2013-12-19 DIAGNOSIS — Z79899 Other long term (current) drug therapy: Secondary | ICD-10-CM

## 2013-12-19 DIAGNOSIS — Z23 Encounter for immunization: Secondary | ICD-10-CM | POA: Diagnosis not present

## 2013-12-19 NOTE — Progress Notes (Signed)
Patient ID: Charles Daniel, male   DOB: Nov 16, 1940, 73 y.o.   MRN: [id number]          Patient Active Problem List   Diagnosis Date Noted  . 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 HIV DISEASE 05/15/2006    Priority: High  . HYPERTENSION 05/15/2006    Priority: High  . CEREBROVASCULAR ACCIDENT 05/15/2006    Priority: High  . Prostate ca 11/08/2012  . Medicare annual wellness visit, initial 08/02/2012  . 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 WEIGHT LOSS 12/18/2008  . CONSTIPATION 03/16/2008  . TUBERCULOSIS 05/15/2006  . SYPHILIS 05/15/2006  . 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  . Ex-smoker 05/15/2006  . HEPATITIS B, HX OF 05/15/2006  . GASTROINTESTINAL HEMORRHAGE, HX OF 05/15/2006    Patient's Medications  New Prescriptions   No medications on file  Previous Medications   ATRIPLA 600-200-300 MG PER TABLET    TAKE 1 TABLET BY MOUTH EVERY DAY   HYDROCHLOROTHIAZIDE (HYDRODIURIL) 50 MG TABLET    TAKE 1 TABLET (50 MG TOTAL) BY MOUTH DAILY.  Modified Medications   No medications on file  Discontinued Medications   ASPIRIN 325 MG TABLET    Take 162 mg by mouth daily.    HYDROCODONE-ACETAMINOPHEN (NORCO) 10-325 MG PER TABLET    Take 1 tablet by mouth every 6 (six) hours as needed for pain.   MULTIPLE VITAMINS-MINERALS (CENTRUM SILVER) TABLET    Take 1 tablet by mouth daily.    Subjective: Tanyon is in for his routine visit. He has had no problems obtaining his Atripla and has not missed a single dose since his last visit. He continues to stay busy with his work. He completed his prostate cancer therapy last year and was told that he has been cured. He is feeling well. Review of Systems: Pertinent items are noted in HPI.  Past Medical History  Diagnosis Date  . HIV infection     DX 1995--  MONITORED BY INFECTIOUS DISEASE (DR Philbert Ocallaghan)  . History of colon cancer     2007 --  S/P RECTOSIGMOID COLECTOMY--  NO CHEMORADIATION--  NO RECURRENCE   . Hypertension   . History of CVA (cerebrovascular accident)     2003-  RIGHT MIDDLE CVA---   NO RESIDUAL  . Bilateral hydrocele 2012  . Diverticulosis 2013    by colonoscopy  . Prostate carcinoma DX  09/2012    T1c, brachytherapy/seed implant Karsten Ro, [place])  . History of hepatitis B     REMOTE AND INACTIVE  PER DOCUMENTATION  . History of syphilis     SECONDARY SYPHILITIS TX'D IN 1998  PER DOCUMENTATION  . History of tuberculosis     LATENT TB  TX'D X12  MONTHS IN 1995  . Depression     History  Substance Use Topics  . Smoking status: Former Smoker -- 0.30 packs/day for 35 years    Types: Cigarettes    Quit date: 03/04/2009  . Smokeless tobacco: Never Used  . Alcohol Use: No    Family History  Problem Relation Age of Onset  . Cancer Sister     breast  . Cancer Father     unsure  . Cancer Brother 27    prostate, treated with seed implant  . CAD Neg Hx   . Stroke Neg Hx   . Diabetes Neg Hx   . Cancer Brother     prostate  . Cancer Brother     prostate  . Cancer  Daughter     breast  . Cancer Other     prostate    No Known Allergies  Objective: Temp: 98.5 F (36.9 C) (08/18 1536) Temp src: Oral (08/18 1536) BP: 150/83 mmHg (08/18 1536) Pulse Rate: 73 (08/18 1536) Body mass index is 22.2 kg/(m^2).  General: He is smiling and in good spirits as usual Oral: No oropharyngeal lesions Skin: No rash Lungs: Clear Cor: Regular S1 and S2 with no murmurs  Lab Results Lab Results  Component Value Date   WBC 7.9 05/18/2013   HGB 12.8* 05/18/2013   HCT 38.3* 05/18/2013   MCV 83.1 05/18/2013   PLT 233 05/18/2013    Lab Results  Component Value Date   CREATININE 0.99 11/15/2013   BUN 8 11/15/2013   NA 139 11/15/2013   K 3.6 11/15/2013   CL 104 11/15/2013   CO2 27 11/15/2013    Lab Results  Component Value Date   ALT 13 11/15/2013   AST 15 11/15/2013   ALKPHOS 87 11/15/2013   BILITOT 0.3 11/15/2013    Lab Results  Component Value Date   CHOL 134 05/18/2013     HDL 38* 05/18/2013   LDLCALC 79 05/18/2013   TRIG 83 05/18/2013   CHOLHDL 3.5 05/18/2013    Lab Results HIV 1 RNA Quant (copies/mL)  Date Value  11/15/2013 <20   05/18/2013 <20   09/06/2012 <20      CD4 T Cell Abs (/uL)  Date Value  11/15/2013 980   05/18/2013 910   09/06/2012 920      Assessment: His HIV infection remains under excellent control.  Plan: 1. Continue Atripla 2. Followup after lab work in [place] months   Michel Bickers, MD [facility] for [place] [phone] pager   [phone] cell 12/19/2013, 4:37 PM

## 2013-12-21 ENCOUNTER — Ambulatory Visit (INDEPENDENT_AMBULATORY_CARE_PROVIDER_SITE_OTHER): Payer: Medicare Other | Admitting: Family Medicine

## 2013-12-21 ENCOUNTER — Encounter: Payer: Self-pay | Admitting: Family Medicine

## 2013-12-21 VITALS — BP 140/82 | HR 80 | Temp 98.2°F | Wt 136.2 lb

## 2013-12-21 DIAGNOSIS — R300 Dysuria: Secondary | ICD-10-CM | POA: Diagnosis not present

## 2013-12-21 DIAGNOSIS — B20 Human immunodeficiency virus [HIV] disease: Secondary | ICD-10-CM

## 2013-12-21 DIAGNOSIS — R21 Rash and other nonspecific skin eruption: Secondary | ICD-10-CM

## 2013-12-21 DIAGNOSIS — I10 Essential (primary) hypertension: Secondary | ICD-10-CM

## 2013-12-21 DIAGNOSIS — C61 Malignant neoplasm of prostate: Secondary | ICD-10-CM | POA: Diagnosis not present

## 2013-12-21 DIAGNOSIS — L280 Lichen simplex chronicus: Secondary | ICD-10-CM | POA: Insufficient documentation

## 2013-12-21 DIAGNOSIS — R634 Abnormal weight loss: Secondary | ICD-10-CM

## 2013-12-21 LAB — POCT URINALYSIS DIPSTICK
BILIRUBIN UA: NEGATIVE
GLUCOSE UA: NEGATIVE
Ketones, UA: NEGATIVE
Leukocytes, UA: NEGATIVE
Nitrite, UA: NEGATIVE
Protein, UA: NEGATIVE
RBC UA: NEGATIVE
SPEC GRAV UA: 1.02
Urobilinogen, UA: 0.2
pH, UA: 6

## 2013-12-21 MED ORDER — TRIAMCINOLONE ACETONIDE 0.1 % EX CREA: 1.0000 "application " | TOPICAL_CREAM | Freq: Two times a day (BID) | CUTANEOUS | Status: DC

## 2013-12-21 NOTE — Progress Notes (Signed)
Pre visit review using our clinic review tool, if applicable. No additional management support is needed unless otherwise documented below in the visit note. 

## 2013-12-21 NOTE — Assessment & Plan Note (Signed)
Suspicious for eczematoid rash. Treat with TCI cream bid for 2 weeks, update if not improved or any worsening.

## 2013-12-21 NOTE — Assessment & Plan Note (Signed)
Chronic,stable. Continue regimen. Lab Results  Component Value Date   K 3.6 11/15/2013

## 2013-12-21 NOTE — Assessment & Plan Note (Addendum)
Unclear etiology. Endorses significant ensure consumption daily. Monitor weights at home and pt to update me if persistent drop. Otherwise will recheck next visit. Body mass index is 22 kg/(m^2).  Wt Readings from Last 3 Encounters:  12/21/13 136 lb 4 oz (61.803 kg)  12/19/13 137 lb 8 oz (62.37 kg)  06/01/13 144 lb (65.318 kg)

## 2013-12-21 NOTE — Progress Notes (Signed)
BP 140/82  Pulse 80  Temp(Src) 98.2 F (36.8 C) (Oral)  Wt 136 lb 4 oz (61.803 kg)   CC: f/u visit  Subjective:    Patient ID: Charles Daniel, male    DOB: 1941/02/28, 73 y.o.   MRN: [id number]  HPI: Charles Daniel is a 73 y.o. male presenting on 12/21/2013 for Follow-up   HTN - compliant with HCTZ 50mg  daily. No HA, vision changes, CP/tightness, SOB, leg swelling.  HIV disease followed by ID.  Prostate cancer s/p seed implant - good report by urology. Endorses occasional intermittent dysuria at end of stream. Drinks 1 bottle per day of mt dew.   Skin rash on posterior L leg - very itchy. Present for months to years. Hasn't tried anything yet. No h/o asthma or allergy hx, no eczema history.  Wt Readings from Last 3 Encounters:  12/21/13 136 lb 4 oz (61.803 kg)  12/19/13 137 lb 8 oz (62.37 kg)  06/01/13 144 lb (65.318 kg)   Body mass index is 22 kg/(m^2). Endorses good appetite. Drinks 6-7 ensure cans daily.  Relevant past medical, surgical, family and social history reviewed and updated as indicated.  Allergies and medications reviewed and updated. Current Outpatient Prescriptions on File Prior to Visit  Medication Sig  . ATRIPLA 600-200-300 MG per tablet TAKE 1 TABLET BY MOUTH EVERY DAY  . hydrochlorothiazide (HYDRODIURIL) 50 MG tablet TAKE 1 TABLET (50 MG TOTAL) BY MOUTH DAILY.   No current facility-administered medications on file prior to visit.    Review of Systems Per HPI unless specifically indicated above    Objective:    BP 140/82  Pulse 80  Temp(Src) 98.2 F (36.8 C) (Oral)  Wt 136 lb 4 oz (61.803 kg)  Physical Exam  Nursing note and vitals reviewed. Constitutional: He appears well-developed and well-nourished. No distress.  HENT:  Mouth/Throat: Oropharynx is clear and moist. No oropharyngeal exudate.  Cardiovascular: Normal rate, regular rhythm, normal heart sounds and intact distal pulses.   No murmur heard. Pulmonary/Chest: Effort normal and  breath sounds normal. No respiratory distress. He has no wheezes. He has no rales.  Musculoskeletal: He exhibits no edema.  Skin: Skin is warm and dry. Rash noted.  Posterior left thigh with patch of thickened highly pruritic skin.   Results for orders placed in visit on 11/15/13  HIV 1 RNA QUANT-NO REFLEX-BLD      Result Value Ref Range   HIV 1 RNA Quant <20  <20 copies/mL   HIV1 RNA Quant, Log <1.30  <1.30 log 10  COMPREHENSIVE METABOLIC PANEL      Result Value Ref Range   Sodium 139  135 - 145 mEq/L   Potassium 3.6  3.5 - 5.3 mEq/L   Chloride 104  96 - 112 mEq/L   CO2 27  19 - 32 mEq/L   Glucose, Bld 75  70 - 99 mg/dL   BUN 8  6 - 23 mg/dL   Creat 0.99  0.50 - 1.35 mg/dL   Total Bilirubin 0.3  0.2 - 1.2 mg/dL   Alkaline Phosphatase 87  39 - 117 U/L   AST 15  0 - 37 U/L   ALT 13  0 - 53 U/L   Total Protein 7.4  6.0 - 8.3 g/dL   Albumin 4.3  3.5 - 5.2 g/dL   Calcium 9.2  8.4 - 10.5 mg/dL  T-HELPER CELL (CD4) - ([facility] ONLY)      Result Value Ref Range   CD4 T Cell  Abs 980  400 - 2700 /uL   CD4 % Helper T Cell 45  33 - 55 %      Assessment & Plan:   Problem List Items Addressed This Visit   HIV DISEASE     Chronic, stable. Continue f/u with ID and atripla.    HYPERTENSION - Primary      Chronic,stable. Continue regimen. Lab Results  Component Value Date   K 3.6 11/15/2013      Prostate ca (Chronic)     Stable, followed by urology appreciate care.    Skin rash     Suspicious for eczematoid rash. Treat with TCI cream bid for 2 weeks, update if not improved or any worsening.    WEIGHT LOSS      Unclear etiology. Endorses significant ensure consumption daily. Monitor weights at home and pt to update me if persistent drop. Otherwise will recheck next visit. Body mass index is 22 kg/(m^2).  Wt Readings from Last 3 Encounters:  12/21/13 136 lb 4 oz (61.803 kg)  12/19/13 137 lb 8 oz (62.37 kg)  06/01/13 144 lb (65.318 kg)          Follow up plan: Return  in about 6 months (around 06/23/2014), or if symptoms worsen or fail to improve, for annual exam, prior fasting for blood work.

## 2013-12-21 NOTE — Assessment & Plan Note (Signed)
Chronic, stable. Continue f/u with ID and atripla.

## 2013-12-21 NOTE — Patient Instructions (Addendum)
Continue hydrochlorothiazide for blood pressure as it's doing well. May try triamcinolone cream for rash behind left thigh. Let's keep an eye on weight.  If continues to drop at home let me know. Otherwise we will check at next visit. Return in 6 months for physical. Wt Readings from Last 3 Encounters:  12/21/13 136 lb 4 oz (61.803 kg)  12/19/13 137 lb 8 oz (62.37 kg)  06/01/13 144 lb (65.318 kg)     Eczema Eczema, also called atopic dermatitis, is a skin disorder that causes inflammation of the skin. It causes a red rash and dry, scaly skin. The skin becomes very itchy. Eczema is generally worse during the cooler winter months and often improves with the warmth of summer. Eczema usually starts showing signs in infancy. Some children outgrow eczema, but it may last through adulthood.  CAUSES  The exact cause of eczema is not known, but it appears to run in families. People with eczema often have a family history of eczema, allergies, asthma, or hay fever. Eczema is not contagious. Flare-ups of the condition may be caused by:   Contact with something you are sensitive or allergic to.   Stress. SIGNS AND SYMPTOMS  Dry, scaly skin.   Red, itchy rash.   Itchiness. This may occur before the skin rash and may be very intense.  DIAGNOSIS  The diagnosis of eczema is usually made based on symptoms and medical history. TREATMENT  Eczema cannot be cured, but symptoms usually can be controlled with treatment and other strategies. A treatment plan might include:  Controlling the itching and scratching.   Use over-the-counter antihistamines as directed for itching. This is especially useful at night when the itching tends to be worse.   Use over-the-counter steroid creams as directed for itching.   Avoid scratching. Scratching makes the rash and itching worse. It may also result in a skin infection (impetigo) due to a break in the skin caused by scratching.   Keeping the skin well  moisturized with creams every day. This will seal in moisture and help prevent dryness. Lotions that contain alcohol and water should be avoided because they can dry the skin.   Limiting exposure to things that you are sensitive or allergic to (allergens).   Recognizing situations that cause stress.   Developing a plan to manage stress.  HOME CARE INSTRUCTIONS   Only take over-the-counter or prescription medicines as directed by your health care provider.   Do not use anything on the skin without checking with your health care provider.   Keep baths or showers short (5 minutes) in warm (not hot) water. Use mild cleansers for bathing. These should be unscented. You may add nonperfumed bath oil to the bath water. It is best to avoid soap and bubble bath.   Immediately after a bath or shower, when the skin is still damp, apply a moisturizing ointment to the entire body. This ointment should be a petroleum ointment. This will seal in moisture and help prevent dryness. The thicker the ointment, the better. These should be unscented.   Keep fingernails cut short. Children with eczema may need to wear soft gloves or mittens at night after applying an ointment.   Dress in clothes made of cotton or cotton blends. Dress lightly, because heat increases itching.   A child with eczema should stay away from anyone with fever blisters or cold sores. The virus that causes fever blisters (herpes simplex) can cause a serious skin infection in children with eczema.  SEEK MEDICAL CARE IF:   Your itching interferes with sleep.   Your rash gets worse or is not better within 1 week after starting treatment.   You see pus or soft yellow scabs in the rash area.   You have a fever.   You have a rash flare-up after contact with someone who has fever blisters.  Document Released: 04/17/2000 Document Revised: 02/08/2013 Document Reviewed: 11/21/2012 [facility] Patient Information 2015 [place],  [place]. This information is not intended to replace advice given to you by your health care provider. Make sure you discuss any questions you have with your health care provider.

## 2013-12-21 NOTE — Assessment & Plan Note (Signed)
Stable, followed by urology appreciate care.

## 2013-12-21 NOTE — Addendum Note (Signed)
Addended by: Royann Shivers A on: 12/21/2013 05:49 PM   Modules accepted: Orders

## 2013-12-22 ENCOUNTER — Encounter: Payer: Self-pay | Admitting: *Deleted

## 2014-02-03 ENCOUNTER — Other Ambulatory Visit: Payer: Self-pay | Admitting: Internal Medicine

## 2014-02-03 DIAGNOSIS — B20 Human immunodeficiency virus [HIV] disease: Secondary | ICD-10-CM

## 2014-03-02 ENCOUNTER — Ambulatory Visit (INDEPENDENT_AMBULATORY_CARE_PROVIDER_SITE_OTHER): Payer: Medicare Other | Admitting: Family Medicine

## 2014-03-02 ENCOUNTER — Encounter: Payer: Self-pay | Admitting: Family Medicine

## 2014-03-02 VITALS — BP 118/60 | HR 74 | Temp 98.2°F | Wt 138.5 lb

## 2014-03-02 DIAGNOSIS — J209 Acute bronchitis, unspecified: Secondary | ICD-10-CM | POA: Insufficient documentation

## 2014-03-02 MED ORDER — GUAIFENESIN-CODEINE 100-10 MG/5ML PO SYRP: 5.0000 mL | ORAL_SOLUTION | Freq: Two times a day (BID) | ORAL | Status: DC | PRN

## 2014-03-02 MED ORDER — AZITHROMYCIN 250 MG PO TABS: ORAL_TABLET | ORAL | Status: DC

## 2014-03-02 NOTE — Progress Notes (Signed)
   BP 118/60  Pulse 74  Temp(Src) 98.2 F (36.8 C) (Oral)  Wt 138 lb 8 oz (62.823 kg)  SpO2 97%   CC: chest congestion  Subjective:    Patient ID: Charles Daniel, male    DOB: 1941/02/01, 73 y.o.   MRN: [id number]  HPI: Charles Daniel is a 73 y.o. male presenting on 03/02/2014 for Chest congestion   1.5 wk h/o cough with chest congestion. Coughing up white phlegm. Some wheezing but no dyspnea. No chest pain  No fevers/chills, ear or tooth pain, ST, PNDrainage, head congestion or headache.   Has tried several OTC meds including 2 bottles of robitussin without improvement. Wife sick recently as well. No smokers at home. No h/o asthma. No recent antibiotic. Tends to get bronchitis every year.   H/o HIV on atripla. Lab Results  Component Value Date   CD4TCELL 45 11/15/2013   CD4TABS 980 11/15/2013    Relevant past medical, surgical, family and social history reviewed and updated as indicated.  Allergies and medications reviewed and updated. Current Outpatient Prescriptions on File Prior to Visit  Medication Sig  . ATRIPLA 600-200-300 MG per tablet TAKE 1 TABLET BY MOUTH EVERY DAY  . hydrochlorothiazide (HYDRODIURIL) 50 MG tablet TAKE 1 TABLET (50 MG TOTAL) BY MOUTH DAILY.  Marland Kitchen triamcinolone cream (KENALOG) 0.1 % Apply 1 application topically 2 (two) times daily. Apply to AA.   No current facility-administered medications on file prior to visit.    Review of Systems Per HPI unless specifically indicated above    Objective:    BP 118/60  Pulse 74  Temp(Src) 98.2 F (36.8 C) (Oral)  Wt 138 lb 8 oz (62.823 kg)  SpO2 97%  Physical Exam  Nursing note and vitals reviewed. Constitutional: He appears well-developed and well-nourished. No distress.  HENT:  Head: Normocephalic and atraumatic.  Right Ear: Hearing, tympanic membrane, external ear and ear canal normal.  Left Ear: Hearing, tympanic membrane, external ear and ear canal normal.  Nose: Nose normal. No mucosal edema  or rhinorrhea. Right sinus exhibits no maxillary sinus tenderness and no frontal sinus tenderness. Left sinus exhibits no maxillary sinus tenderness and no frontal sinus tenderness.  Mouth/Throat: Uvula is midline, oropharynx is clear and moist and mucous membranes are normal. No oropharyngeal exudate, posterior oropharyngeal edema, posterior oropharyngeal erythema or tonsillar abscesses.  Eyes: Conjunctivae and EOM are normal. Pupils are equal, round, and reactive to light. No scleral icterus.  Neck: Normal range of motion. Neck supple.  Cardiovascular: Normal rate, regular rhythm, normal heart sounds and intact distal pulses.   No murmur heard. Pulmonary/Chest: Effort normal and breath sounds normal. No respiratory distress. He has no wheezes. He has no rales.  Lymphadenopathy:    He has no cervical adenopathy.  Skin: Skin is warm and dry. No rash noted.       Assessment & Plan:   Problem List Items Addressed This Visit   Acute bronchitis - Primary     Given comorbidities treat agressively with zpack. cheratussin for cough at night time. Update if not improved with this treatment. Pt agrees with plan.        Follow up plan: Return if symptoms worsen or fail to improve.

## 2014-03-02 NOTE — Assessment & Plan Note (Signed)
Given comorbidities treat agressively with zpack. cheratussin for cough at night time. Update if not improved with this treatment. Pt agrees with plan.

## 2014-03-02 NOTE — Patient Instructions (Signed)
I think you have a bronchitis - treat with lots of fluids and plenty of rest. Plain mucinex with a glass of water each time you take it to help mobilize mucous out. Codeine cough syrup for night time. zpack antibiotic. Let us know if not improving as expected or fever >101 or worsening productive cough.

## 2014-03-02 NOTE — Progress Notes (Signed)
Pre visit review using our clinic review tool, if applicable. No additional management support is needed unless otherwise documented below in the visit note. 

## 2014-03-04 DIAGNOSIS — J439 Emphysema, unspecified: Secondary | ICD-10-CM

## 2014-03-04 HISTORY — DX: Emphysema, unspecified: J43.9

## 2014-03-13 ENCOUNTER — Telehealth: Payer: Self-pay

## 2014-03-13 NOTE — Telephone Encounter (Signed)
Pt was seen 03/02/14; pt finished antibiotic; pt is no better; when pt blows nose has white phlegm; pt has non prod cough with wheezing at nighttime. No SOB or fever. Pt request different antibiotic to [facility]Please advise.

## 2014-03-13 NOTE — Telephone Encounter (Signed)
Patient notified and verbalized understanding Appt scheduled.   

## 2014-03-13 NOTE — Telephone Encounter (Signed)
If no better recommend re eval in office prior to another abx.

## 2014-03-14 ENCOUNTER — Encounter: Payer: Self-pay | Admitting: Family Medicine

## 2014-03-14 ENCOUNTER — Ambulatory Visit (INDEPENDENT_AMBULATORY_CARE_PROVIDER_SITE_OTHER)
Admission: RE | Admit: 2014-03-14 | Discharge: 2014-03-14 | Disposition: A | Discharge status: Home / Self Care | Payer: Medicare Other | Source: Ambulatory Visit | Attending: Family Medicine | Admitting: Family Medicine

## 2014-03-14 ENCOUNTER — Ambulatory Visit (INDEPENDENT_AMBULATORY_CARE_PROVIDER_SITE_OTHER): Payer: Medicare Other | Admitting: Family Medicine

## 2014-03-14 ENCOUNTER — Telehealth: Payer: Self-pay

## 2014-03-14 VITALS — BP 130/80 | HR 70 | Temp 97.7°F | Wt 135.0 lb

## 2014-03-14 DIAGNOSIS — J209 Acute bronchitis, unspecified: Secondary | ICD-10-CM

## 2014-03-14 DIAGNOSIS — J439 Emphysema, unspecified: Secondary | ICD-10-CM | POA: Diagnosis not present

## 2014-03-14 DIAGNOSIS — J984 Other disorders of lung: Secondary | ICD-10-CM | POA: Diagnosis not present

## 2014-03-14 MED ORDER — HYDROCOD POLST-CHLORPHEN POLST 10-8 MG/5ML PO LQCR: 5.0000 mL | Freq: Every evening | ORAL | Status: DC | PRN

## 2014-03-14 MED ORDER — ALBUTEROL SULFATE HFA 108 (90 BASE) MCG/ACT IN AERS: 2.0000 | INHALATION_SPRAY | Freq: Four times a day (QID) | RESPIRATORY_TRACT | Status: DC | PRN

## 2014-03-14 MED ORDER — HYDROCODONE-HOMATROPINE 5-1.5 MG/5ML PO SYRP: 5.0000 mL | ORAL_SOLUTION | Freq: Two times a day (BID) | ORAL | Status: DC | PRN

## 2014-03-14 NOTE — Progress Notes (Signed)
Pre visit review using our clinic review tool, if applicable. No additional management support is needed unless otherwise documented below in the visit note. 

## 2014-03-14 NOTE — Patient Instructions (Signed)
I think you have persistent cough after bronchitis which should improve with time. Xray today looking ok. Treat cough with tussionex cough syrup for night time, plain mucinex or fast relief guaifenesin (over the counter) with plenty of water to help mobilize mucous out, and plenty of fluids and rest. May use albuterol inhaler as needed for night time wheezing - this may make you jittery. Let us know if fever >101, or worsening productive cough or worsening wheezing or shortness of breath.

## 2014-03-14 NOTE — Progress Notes (Signed)
BP 130/80 mmHg  Pulse 70  Temp(Src) 97.7 F (36.5 C) (Oral)  Wt 135 lb (61.236 kg)  SpO2 97%   CC: recheck bronchitis  Subjective:    Patient ID: Charles Daniel, male    DOB: 04/07/1941, 73 y.o.   MRN: [id number]  HPI: Charles Daniel is a 73 y.o. male presenting on 03/14/2014 for Follow-up   Seen here 03/02/2014 with 1.5 wk h/o cough and chest congestion, dx acute bronchitis, treated with zpack course. No dyspnea but endorses some night time wheezing. Persistently productive of white mucous. At that time denied fevers/chills, ear or tooth pain, ST, PNDrainage, head congestion or headache.   Presents today for re evaluation. Completed zpack, but still with persistent cough. Codeine cough syrup did not help either.   Had tried several OTC meds including 2 bottles of robitussin without improvement. Wife sick recently as well. No smokers at home. Ex smoker, quit 2010 No h/o asthma. No recent antibiotic use. Tends to get bronchitis every year.  Denies GERD or allergic rhinitis history.  H/o HIV on atripla. Sees ID Q6 mo. Lab Results  Component Value Date   CD4TCELL 45 11/15/2013   CD4TABS 980 11/15/2013    Relevant past medical, surgical, family and social history reviewed and updated as indicated.  Allergies and medications reviewed and updated. Current Outpatient Prescriptions on File Prior to Visit  Medication Sig  . ATRIPLA 600-200-300 MG per tablet TAKE 1 TABLET BY MOUTH EVERY DAY  . hydrochlorothiazide (HYDRODIURIL) 50 MG tablet TAKE 1 TABLET (50 MG TOTAL) BY MOUTH DAILY.  Marland Kitchen triamcinolone cream (KENALOG) 0.1 % Apply 1 application topically 2 (two) times daily. Apply to AA.   No current facility-administered medications on file prior to visit.    Review of Systems Per HPI unless specifically indicated above    Objective:    BP 130/80 mmHg  Pulse 70  Temp(Src) 97.7 F (36.5 C) (Oral)  Wt 135 lb (61.236 kg)  SpO2 97%  Physical Exam  Constitutional: He  appears well-developed and well-nourished. No distress.  HENT:  Head: Normocephalic and atraumatic.  Right Ear: Hearing normal.  Left Ear: Hearing normal.  Nose: Nose normal.  Mouth/Throat: Uvula is midline, oropharynx is clear and moist and mucous membranes are normal. No oropharyngeal exudate, posterior oropharyngeal edema, posterior oropharyngeal erythema or tonsillar abscesses.  Eyes: Conjunctivae and EOM are normal. Pupils are equal, round, and reactive to light. No scleral icterus.  Neck: Normal range of motion. Neck supple.  Cardiovascular: Normal rate, regular rhythm, normal heart sounds and intact distal pulses.   No murmur heard. Pulmonary/Chest: Effort normal and breath sounds normal. No respiratory distress. He has no wheezes. He has no rales.  Lymphadenopathy:    He has no cervical adenopathy.  Skin: Skin is warm and dry. No rash noted.  Nursing note and vitals reviewed.      Assessment & Plan:   Problem List Items Addressed This Visit    Acute bronchitis - Primary    Recent acute bronchitis dx, completed zpack course. Anticipate post infectious cough. Discussed this. In HIV history, check CXR today - clear on my read. Treat with tussionex cough syrup, OTC plain mucinex and rest. Update if new sxs like fever, worsening productive cough or wheezing. Albuterol inhaler prn given endorsed wheezing and smoking history. Pt agrees with plan.    Relevant Orders      DG Chest 2 View       Follow up plan: Return if symptoms worsen or fail  to improve.

## 2014-03-14 NOTE — Assessment & Plan Note (Signed)
Recent acute bronchitis dx, completed zpack course. Anticipate post infectious cough. Discussed this. In HIV history, check CXR today - clear on my read. Treat with tussionex cough syrup, OTC plain mucinex and rest. Update if new sxs like fever, worsening productive cough or wheezing. Albuterol inhaler prn given endorsed wheezing and smoking history. Pt agrees with plan.

## 2014-03-14 NOTE — Telephone Encounter (Signed)
Spoke with Patrick Jupiter at pharmacy and hycodan is~$33. Patient notified and said that is affordable. Rx placed up front for pick up.

## 2014-03-14 NOTE — Telephone Encounter (Signed)
plz price out hycodan - if more affordable printed and placed in [facility].

## 2014-03-14 NOTE — Telephone Encounter (Signed)
Charles Daniel from [facility] left v/m; Tussionex cost to pt is $90.00. Request less expensive med substitution called to [facility].

## 2014-04-14 ENCOUNTER — Other Ambulatory Visit: Payer: Self-pay | Admitting: Internal Medicine

## 2014-06-08 DIAGNOSIS — C61 Malignant neoplasm of prostate: Secondary | ICD-10-CM | POA: Diagnosis not present

## 2014-06-12 DIAGNOSIS — C61 Malignant neoplasm of prostate: Secondary | ICD-10-CM | POA: Diagnosis not present

## 2014-06-13 ENCOUNTER — Other Ambulatory Visit: Payer: Medicare Other

## 2014-06-13 DIAGNOSIS — Z113 Encounter for screening for infections with a predominantly sexual mode of transmission: Secondary | ICD-10-CM

## 2014-06-13 DIAGNOSIS — Z79899 Other long term (current) drug therapy: Secondary | ICD-10-CM

## 2014-06-13 DIAGNOSIS — B20 Human immunodeficiency virus [HIV] disease: Secondary | ICD-10-CM

## 2014-06-13 LAB — COMPREHENSIVE METABOLIC PANEL
ALBUMIN: 4.6 g/dL (ref 3.5–5.2)
ALT: 12 U/L (ref 0–53)
AST: 16 U/L (ref 0–37)
Alkaline Phosphatase: 103 U/L (ref 39–117)
BUN: 11 mg/dL (ref 6–23)
CALCIUM: 9.6 mg/dL (ref 8.4–10.5)
CHLORIDE: 101 meq/L (ref 96–112)
CO2: 29 meq/L (ref 19–32)
CREATININE: 1.07 mg/dL (ref 0.50–1.35)
GLUCOSE: 86 mg/dL (ref 70–99)
POTASSIUM: 4 meq/L (ref 3.5–5.3)
SODIUM: 139 meq/L (ref 135–145)
Total Bilirubin: 0.4 mg/dL (ref 0.2–1.2)
Total Protein: 8 g/dL (ref 6.0–8.3)

## 2014-06-13 LAB — LIPID PANEL
CHOLESTEROL: 167 mg/dL (ref 0–200)
HDL: 52 mg/dL (ref >39)
LDL CALC: 104 mg/dL — AB (ref 0–99)
Total CHOL/HDL Ratio: 3.2 Ratio
Triglycerides: 54 mg/dL (ref <150)
VLDL: 11 mg/dL (ref 0–40)

## 2014-06-13 LAB — CBC
HEMATOCRIT: 42.9 % (ref 39.0–52.0)
HEMOGLOBIN: 14.3 g/dL (ref 13.0–17.0)
MCH: 28.2 pg (ref 26.0–34.0)
MCHC: 33.3 g/dL (ref 30.0–36.0)
MCV: 84.6 fL (ref 78.0–100.0)
MPV: 10.1 fL (ref 8.6–12.4)
Platelets: 305 10*3/uL (ref 150–400)
RBC: 5.07 MIL/uL (ref 4.22–5.81)
RDW: 15.8 % — ABNORMAL HIGH (ref 11.5–15.5)
WBC: 8.8 10*3/uL (ref 4.0–10.5)

## 2014-06-14 LAB — RPR TITER

## 2014-06-14 LAB — T-HELPER CELL (CD4) - (RCID CLINIC ONLY)
CD4 % Helper T Cell: 39 % (ref 33–55)
CD4 T CELL ABS: 880 /uL (ref 400–2700)

## 2014-06-14 LAB — FLUORESCENT TREPONEMAL AB(FTA)-IGG-BLD: FLUORESCENT TREPONEMAL ABS: REACTIVE — AB

## 2014-06-14 LAB — HIV-1 RNA QUANT-NO REFLEX-BLD: HIV 1 RNA Quant: <20 copies/mL (ref <20)

## 2014-06-14 LAB — RPR: RPR Ser Ql: REACTIVE — AB

## 2014-06-19 ENCOUNTER — Other Ambulatory Visit: Payer: Medicare Other

## 2014-06-19 ENCOUNTER — Other Ambulatory Visit: Payer: Self-pay | Admitting: Family Medicine

## 2014-06-26 ENCOUNTER — Encounter: Payer: Self-pay | Admitting: Family Medicine

## 2014-06-26 ENCOUNTER — Ambulatory Visit (INDEPENDENT_AMBULATORY_CARE_PROVIDER_SITE_OTHER): Payer: Medicare Other | Admitting: Family Medicine

## 2014-06-26 VITALS — BP 140/80 | HR 72 | Temp 98.1°F | Ht 67.0 in | Wt 141.8 lb

## 2014-06-26 DIAGNOSIS — Z7189 Other specified counseling: Secondary | ICD-10-CM | POA: Insufficient documentation

## 2014-06-26 DIAGNOSIS — Z0000 Encounter for general adult medical examination without abnormal findings: Secondary | ICD-10-CM | POA: Diagnosis not present

## 2014-06-26 DIAGNOSIS — B20 Human immunodeficiency virus [HIV] disease: Secondary | ICD-10-CM

## 2014-06-26 DIAGNOSIS — I10 Essential (primary) hypertension: Secondary | ICD-10-CM

## 2014-06-26 DIAGNOSIS — R21 Rash and other nonspecific skin eruption: Secondary | ICD-10-CM

## 2014-06-26 DIAGNOSIS — C20 Malignant neoplasm of rectum: Secondary | ICD-10-CM

## 2014-06-26 DIAGNOSIS — C61 Malignant neoplasm of prostate: Secondary | ICD-10-CM

## 2014-06-26 NOTE — Assessment & Plan Note (Signed)
Chronic, stable on atripla. Followed by ID clinic Dr Megan Salon

## 2014-06-26 NOTE — Assessment & Plan Note (Signed)
Advanced directive discussion - doesn't have set up. Would like wife to be HCPOA. Doesn't want prolonged life support.

## 2014-06-26 NOTE — Progress Notes (Signed)
BP 140/80 mmHg  Pulse 72  Temp(Src) 98.1 F (36.7 C) (Oral)  Ht 5\' 7"  (1.702 m)  Wt 141 lb 12 oz (64.297 kg)  BMI 22.20 kg/m2   CC: medicare wellness visit  Subjective:    Patient ID: Charles Daniel, male    DOB: Mar 01, 1941, 74 y.o.   MRN: [id number]  HPI: Charles Daniel is a 74 y.o. male presenting on 06/26/2014 for Annual Exam   Spot on skin - present for 3-4 yrs. Very itchy. No bleeding or enlarging. Triamcinolone cream not helpful. Requests referral to derm.   Hearing screen - passed Vision screen - passed Fall risk screen - passed Depression screen - passed Seat belt use discussed  Preventative: Procedure COLONOSCOPY Date: 08/2011 3 polyps, diverticulosis, rec rpt 5 yrs Charles Daniel) Prostate cancer - sees Karsten Ro, last saw last month Lung cancer screening - smoking 1/3 for 30 yrs Flu shot - 12/2013 Tetanus shot - 08/2012 Pneumovax - 09/2010.  Shingles shot - ? Advanced directive discussion - doesn't have set up. Would like wife to be HCPOA. Doesn't want prolonged life support.   Caffeine: 3 sodas/day Lives with wife. 2 grown sons. 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 Interim medical history since our last visit reviewed. Allergies and medications reviewed and updated. Current Outpatient Prescriptions on File Prior to Visit  Medication Sig  . ATRIPLA 600-200-300 MG per tablet TAKE 1 TABLET BY MOUTH EVERY DAY  . hydrochlorothiazide (HYDRODIURIL) 50 MG tablet TAKE 1 TABLET BY MOUTH DAILY   No current facility-administered medications on file prior to visit.    Review of Systems Per HPI unless specifically indicated above     Objective:    BP 140/80 mmHg  Pulse 72  Temp(Src) 98.1 F (36.7 C) (Oral)  Ht 5\' 7"  (1.702 m)  Wt 141 lb 12 oz (64.297 kg)  BMI  22.20 kg/m2  Wt Readings from Last 3 Encounters:  06/26/14 141 lb 12 oz (64.297 kg)  03/14/14 135 lb (61.236 kg)  03/02/14 138 lb 8 oz (62.823 kg)    Physical Exam  Constitutional: He is oriented to person, place, and time. He appears well-developed and well-nourished. No distress.  HENT:  Head: Normocephalic and atraumatic.  Right Ear: Hearing, tympanic membrane, external ear and ear canal normal.  Left Ear: Hearing, tympanic membrane, external ear and ear canal normal.  Nose: Nose normal.  Mouth/Throat: Uvula is midline, oropharynx is clear and moist and mucous membranes are normal. No oropharyngeal exudate, posterior oropharyngeal edema or posterior oropharyngeal erythema.  Eyes: Conjunctivae and EOM are normal. Pupils are equal, round, and reactive to light. No scleral icterus.  Neck: Normal range of motion. Neck supple. Carotid bruit is not present. No thyromegaly present.  Cardiovascular: Normal rate, regular rhythm, normal heart sounds and intact distal pulses.   No murmur heard. Pulses:      Radial pulses are 2+ on the right side, and 2+ on the left side.  Pulmonary/Chest: Effort normal and breath sounds normal. No respiratory distress. He has no wheezes. He has no rales.  Abdominal: Soft. Bowel sounds are normal. He exhibits no distension and no mass. There is no tenderness. There is no rebound and no guarding.  Musculoskeletal: Normal range of motion. He exhibits no edema.  Lymphadenopathy:    He has no cervical adenopathy.  Neurological: He is alert and oriented to person, place, and time.  CN grossly intact, station and gait intact Recall 3/3 Calculation 5/5 D-L-R-O-W   Skin: Skin is warm and dry. Rash noted.  Pruritic dry patch with skin thickening L posterior leg above popliteal area  Psychiatric: He has a normal mood and affect. His behavior is normal. Judgment and thought content normal.  Nursing note and vitals reviewed.  Results for orders placed or performed in  visit on 06/13/14  T-helper cell (CD4)- ([facility] only)  Result Value Ref Range   CD4 T Cell Abs 880 400 - 2700 /uL   CD4 % Helper T Cell 39 33 - 55 %  HIV 1 RNA quant-no reflex-bld  Result Value Ref Range   HIV 1 RNA Quant <20 <20 copies/mL   HIV1 RNA Quant, Log <1.30 <1.30 log 10  CBC  Result Value Ref Range   WBC 8.8 4.0 - 10.5 K/uL   RBC 5.07 4.22 - 5.81 MIL/uL   Hemoglobin 14.3 13.0 - 17.0 g/dL   HCT 42.9 39.0 - 52.0 %   MCV 84.6 78.0 - 100.0 fL   MCH 28.2 26.0 - 34.0 pg   MCHC 33.3 30.0 - 36.0 g/dL   RDW 15.8 (H) 11.5 - 15.5 %   Platelets 305 150 - 400 K/uL   MPV 10.1 8.6 - 12.4 fL  Comprehensive metabolic panel  Result Value Ref Range   Sodium 139 135 - 145 mEq/L   Potassium 4.0 3.5 - 5.3 mEq/L   Chloride 101 96 - 112 mEq/L   CO2 29 19 - 32 mEq/L   Glucose, Bld 86 70 - 99 mg/dL   BUN 11 6 - 23 mg/dL   Creat 1.07 0.50 - 1.35 mg/dL   Total Bilirubin 0.4 0.2 - 1.2 mg/dL   Alkaline Phosphatase 103 39 - 117 U/L   AST 16 0 - 37 U/L   ALT 12 0 - 53 U/L   Total Protein 8.0 6.0 - 8.3 g/dL   Albumin 4.6 3.5 - 5.2 g/dL   Calcium 9.6 8.4 - 10.5 mg/dL  Lipid panel  Result Value Ref Range   Cholesterol 167 0 - 200 mg/dL   Triglycerides 54 <150 mg/dL   HDL 52 >39 mg/dL   Total CHOL/HDL Ratio 3.2 Ratio   VLDL 11 0 - 40 mg/dL   LDL Cholesterol 104 (H) 0 - 99 mg/dL  RPR  Result Value Ref Range   RPR Ser Ql REACTIVE (A) NON REAC  Fluorescent treponemal ab(fta)-IgG-bld  Result Value Ref Range   Fluorescent Treponemal ABS REACTIVE (A)   Rpr titer  Result Value Ref Range   RPR Titer 1:2       Assessment & Plan:   Problem List Items Addressed This Visit    Skin rash    Previously thought eczematoid rash. According to patient did not respond to TCI cream bid course. Will refer to derm per pt request.      Relevant Orders   Ambulatory referral to Dermatology   Prostate ca (Chronic)    Followed by urology Dr Jaclyn Prime annual wellness visit, subsequent  - Primary    I have personally reviewed the Medicare Annual Wellness questionnaire and have noted 1. The patient's medical and social history 2. Their use of alcohol, tobacco or illicit drugs 3. Their current medications and supplements 4. The patient's functional ability including ADL's, fall risks, home safety risks and hearing or visual impairment.  5. Diet and physical activity 6. Evidence for depression or mood disorders The patients weight, height, BMI have been recorded in the chart.  Hearing and vision has been addressed. I have made referrals, counseling and provided education to the patient based review of the above and I have provided the pt with a written personalized care plan for preventive services. Provider list updated - see scanned questionairre. Reviewed preventative protocols and updated unless pt declined.       Human immunodeficiency virus (HIV) disease    Chronic, stable on atripla. Followed by ID clinic Dr Megan Salon      Relevant Orders   Ambulatory referral to Dermatology   Essential hypertension    Chronic, stable on hctz daily. Continue regimen.      Advanced care planning/counseling discussion    Advanced directive discussion - doesn't have set up. Would like wife to be HCPOA. Doesn't want prolonged life support.       ADENOCARCINOMA, RECTUM    Colonoscopy UTD.          Follow up plan: Return in about 1 year (around 06/27/2015), or as needed, for medicare wellness.

## 2014-06-26 NOTE — Assessment & Plan Note (Signed)
Previously thought eczematoid rash. According to patient did not respond to TCI cream bid course. Will refer to derm per pt request.

## 2014-06-26 NOTE — Assessment & Plan Note (Signed)

## 2014-06-26 NOTE — Assessment & Plan Note (Signed)
Colonoscopy UTD. 

## 2014-06-26 NOTE — Progress Notes (Signed)
Pre visit review using our clinic review tool, if applicable. No additional management support is needed unless otherwise documented below in the visit note. 

## 2014-06-26 NOTE — Assessment & Plan Note (Signed)
Followed by urology Dr Karsten Ro

## 2014-06-26 NOTE — Assessment & Plan Note (Signed)
Chronic, stable on hctz daily. Continue regimen.

## 2014-06-26 NOTE — Patient Instructions (Addendum)
Check with Dr Megan Salon about prevnar.  Pass by Marion's office to schedule dermatology referral. You are doing well today. Return as needed or in 1 year for medicare wellness visit.

## 2014-07-03 ENCOUNTER — Ambulatory Visit (INDEPENDENT_AMBULATORY_CARE_PROVIDER_SITE_OTHER): Payer: Medicare Other | Admitting: Internal Medicine

## 2014-07-03 ENCOUNTER — Encounter: Payer: Self-pay | Admitting: Internal Medicine

## 2014-07-03 DIAGNOSIS — B20 Human immunodeficiency virus [HIV] disease: Secondary | ICD-10-CM

## 2014-07-03 NOTE — Progress Notes (Signed)
Patient ID: Charles Daniel, male   DOB: 1940-05-29, 74 y.o.   MRN: [id number]          Patient Active Problem List   Diagnosis Date Noted  . 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 Human immunodeficiency virus (HIV) disease 05/15/2006    Priority: High  . Essential hypertension 05/15/2006    Priority: High  . CEREBROVASCULAR ACCIDENT 05/15/2006    Priority: High  . Advanced care planning/counseling discussion 06/26/2014  . Skin rash 12/21/2013  . Prostate ca 11/08/2012  . Medicare annual wellness visit, subsequent 08/02/2012  . 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 WEIGHT LOSS 12/18/2008  . CONSTIPATION 03/16/2008  . TUBERCULOSIS 05/15/2006  . SYPHILIS 05/15/2006  . 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  . Ex-smoker 05/15/2006  . HEPATITIS B, HX OF 05/15/2006  . GASTROINTESTINAL HEMORRHAGE, HX OF 05/15/2006    Patient's Medications  New Prescriptions   No medications on file  Previous Medications   ATRIPLA 600-200-300 MG PER TABLET    TAKE 1 TABLET BY MOUTH EVERY DAY   HYDROCHLOROTHIAZIDE (HYDRODIURIL) 50 MG TABLET    TAKE 1 TABLET BY MOUTH DAILY  Modified Medications   No medications on file  Discontinued Medications   No medications on file    Subjective: Charles Daniel is in for his routine visit for HIV follow-up. He has had no problems obtaining his Atripla and has not missed a single dose since his last visit. He is feeling well. Review of Systems: Pertinent items are noted in HPI.  Past Medical History  Diagnosis Date  . HIV infection     DX 1995--  MONITORED BY INFECTIOUS DISEASE (DR Abhijot Straughter)  . History of colon cancer     2007 --  S/P RECTOSIGMOID COLECTOMY--  NO CHEMORADIATION--  NO RECURRENCE  . Hypertension   . History of CVA (cerebrovascular accident)     2003-  RIGHT MIDDLE CVA---   NO RESIDUAL  . Bilateral hydrocele 2012  . Diverticulosis 2013    by colonoscopy  . Prostate carcinoma DX  09/2012    T1c,  brachytherapy/seed implant Karsten Ro, [place])  . History of hepatitis B     REMOTE AND INACTIVE  PER DOCUMENTATION  . History of syphilis     SECONDARY SYPHILITIS TX'D IN 1998  PER DOCUMENTATION  . History of tuberculosis     LATENT TB  TX'D X12  MONTHS IN 1995  . Depression   . Emphysema lung 03/2014     by CXR, remote smoking history    History  Substance Use Topics  . Smoking status: Former Smoker -- 0.30 packs/day for 35 years    Types: Cigarettes    Quit date: 03/04/2009  . Smokeless tobacco: Never Used  . Alcohol Use: No    Family History  Problem Relation Age of Onset  . Cancer Sister     breast  . Cancer Father     unsure  . Cancer Brother 26    prostate, treated with seed implant  . CAD Neg Hx   . Stroke Neg Hx   . Diabetes Neg Hx   . Cancer Brother     prostate  . Cancer Brother     prostate  . Cancer Daughter     breast  . Cancer Other     prostate    No Known Allergies  Objective: Temp: 98.1 F (36.7 C) (03/01 1511) Temp Source: Oral (03/01 1511) BP: 175/92 mmHg (03/01 1511) Pulse Rate: 73 (03/01  1511) Body mass index is 22.16 kg/(m^2).  General: He is smiling and in good spirits as usual Oral: No oropharyngeal lesions Skin: No rash Lungs: Clear Cor: Regular S1 and S2 with no murmurs  Lab Results Lab Results  Component Value Date   WBC 8.8 06/13/2014   HGB 14.3 06/13/2014   HCT 42.9 06/13/2014   MCV 84.6 06/13/2014   PLT 305 06/13/2014    Lab Results  Component Value Date   CREATININE 1.07 06/13/2014   BUN 11 06/13/2014   NA 139 06/13/2014   K 4.0 06/13/2014   CL 101 06/13/2014   CO2 29 06/13/2014    Lab Results  Component Value Date   ALT 12 06/13/2014   AST 16 06/13/2014   ALKPHOS 103 06/13/2014   BILITOT 0.4 06/13/2014    Lab Results  Component Value Date   CHOL 167 06/13/2014   HDL 52 06/13/2014   LDLCALC 104* 06/13/2014   TRIG 54 06/13/2014   CHOLHDL 3.2 06/13/2014    Lab Results HIV 1 RNA QUANT (copies/mL)    Date Value  06/13/2014 <20  11/15/2013 <20  05/18/2013 <20   CD4 T CELL ABS (/uL)  Date Value  06/13/2014 880  11/15/2013 980  05/18/2013 910     Assessment: His HIV infection remains under perfect, long-term control.  Plan: 1. Continue Atripla 2. Followup after lab work in [place] months   Michel Bickers, MD [facility] [phone] pager   [phone] cell 07/03/2014, 3:46 PM

## 2014-07-24 DIAGNOSIS — L299 Pruritus, unspecified: Secondary | ICD-10-CM | POA: Diagnosis not present

## 2014-07-24 DIAGNOSIS — L280 Lichen simplex chronicus: Secondary | ICD-10-CM | POA: Diagnosis not present

## 2014-07-24 DIAGNOSIS — R21 Rash and other nonspecific skin eruption: Secondary | ICD-10-CM | POA: Diagnosis not present

## 2014-07-30 ENCOUNTER — Encounter: Payer: Self-pay | Admitting: Family Medicine

## 2014-09-04 ENCOUNTER — Other Ambulatory Visit: Payer: Self-pay | Admitting: Internal Medicine

## 2014-11-07 ENCOUNTER — Other Ambulatory Visit: Payer: Medicare Other

## 2014-11-07 DIAGNOSIS — B20 Human immunodeficiency virus [HIV] disease: Secondary | ICD-10-CM

## 2014-11-07 LAB — CBC
HEMATOCRIT: 41.5 % (ref 39.0–52.0)
HEMOGLOBIN: 13.7 g/dL (ref 13.0–17.0)
MCH: 27.8 pg (ref 26.0–34.0)
MCHC: 33 g/dL (ref 30.0–36.0)
MCV: 84.2 fL (ref 78.0–100.0)
MPV: 9.6 fL (ref 8.6–12.4)
Platelets: 344 10*3/uL (ref 150–400)
RBC: 4.93 MIL/uL (ref 4.22–5.81)
RDW: 16.4 % — AB (ref 11.5–15.5)
WBC: 9.2 10*3/uL (ref 4.0–10.5)

## 2014-11-08 LAB — COMPREHENSIVE METABOLIC PANEL
ALK PHOS: 87 U/L (ref 39–117)
ALT: 10 U/L (ref 0–53)
AST: 14 U/L (ref 0–37)
Albumin: 3.7 g/dL (ref 3.5–5.2)
BUN: 9 mg/dL (ref 6–23)
CHLORIDE: 103 meq/L (ref 96–112)
CO2: 28 mEq/L (ref 19–32)
CREATININE: 0.93 mg/dL (ref 0.50–1.35)
Calcium: 9.4 mg/dL (ref 8.4–10.5)
Glucose, Bld: 93 mg/dL (ref 70–99)
Potassium: 4 mEq/L (ref 3.5–5.3)
SODIUM: 142 meq/L (ref 135–145)
Total Bilirubin: 0.3 mg/dL (ref 0.2–1.2)
Total Protein: 7.5 g/dL (ref 6.0–8.3)

## 2014-11-08 LAB — HIV-1 RNA QUANT-NO REFLEX-BLD
HIV 1 RNA Quant: <20 copies/mL (ref <20)
HIV-1 RNA Quant, Log: <1.3 {Log} (ref <1.30)

## 2014-11-12 ENCOUNTER — Other Ambulatory Visit: Payer: Medicare Other

## 2014-11-12 DIAGNOSIS — B20 Human immunodeficiency virus [HIV] disease: Secondary | ICD-10-CM

## 2014-11-13 NOTE — Addendum Note (Signed)
Addended by: Dolan Amen D on: 11/13/2014 03:51 PM   Modules accepted: Orders

## 2014-11-14 LAB — T-HELPER CELL (CD4) - (RCID CLINIC ONLY)
CD4 % Helper T Cell: 40 % (ref 33–55)
CD4 T CELL ABS: 1000 /uL (ref 400–2700)

## 2014-11-22 ENCOUNTER — Ambulatory Visit (INDEPENDENT_AMBULATORY_CARE_PROVIDER_SITE_OTHER): Payer: Medicare Other | Admitting: Internal Medicine

## 2014-11-22 ENCOUNTER — Encounter: Payer: Self-pay | Admitting: Internal Medicine

## 2014-11-22 VITALS — BP 144/84 | HR 83 | Temp 98.1°F | Wt 133.2 lb

## 2014-11-22 DIAGNOSIS — B20 Human immunodeficiency virus [HIV] disease: Secondary | ICD-10-CM

## 2014-11-22 NOTE — Progress Notes (Signed)
Patient ID: Charles Daniel, male   DOB: 08/19/1940, 74 y.o.   MRN: [id number]          Patient Active Problem List   Diagnosis Date Noted  . 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 Human immunodeficiency virus (HIV) disease 05/15/2006    Priority: High  . Essential hypertension 05/15/2006    Priority: High  . CEREBROVASCULAR ACCIDENT 05/15/2006    Priority: High  . Advanced care planning/counseling discussion 06/26/2014  . 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 Prostate ca 11/08/2012  . Medicare annual wellness visit, subsequent 08/02/2012  . 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 WEIGHT LOSS 12/18/2008  . CONSTIPATION 03/16/2008  . TUBERCULOSIS 05/15/2006  . SYPHILIS 05/15/2006  . 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  . Ex-smoker 05/15/2006  . HEPATITIS B, HX OF 05/15/2006  . GASTROINTESTINAL HEMORRHAGE, HX OF 05/15/2006    Patient's Medications  New Prescriptions   No medications on file  Previous Medications   ATRIPLA 600-200-300 MG PER TABLET    TAKE 1 TABLET BY MOUTH EVERY DAY   HYDROCHLOROTHIAZIDE (HYDRODIURIL) 50 MG TABLET    TAKE 1 TABLET BY MOUTH DAILY  Modified Medications   No medications on file  Discontinued Medications   No medications on file    Subjective: [name] is in for his routine HIV follow-up visit. He is feeling well but is concerned that he is losing weight. He states that he has very good appetite that is unchanged. He has never been a breakfast eater but he always eats lunch and dinner. He also like strawberry ensure and will generally by 24 cans in a case and often consumed them all within 3 days. He has not had any fever. He has not had any nausea, vomiting, diarrhea or abdominal pain. 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 He denies missing any doses of his Atripla or hydrochlorothiazide. Review of Systems: Constitutional: positive for weight loss, negative for anorexia and fevers Eyes:  negative Ears, nose, mouth, throat, and face: negative Respiratory: negative Cardiovascular: negative Gastrointestinal: negative Genitourinary:negative  Past Medical History  Diagnosis Date  . HIV infection     DX 1995--  MONITORED BY INFECTIOUS DISEASE (DR Derian Dimalanta)  . History of colon cancer     2007 --  S/P RECTOSIGMOID COLECTOMY--  NO CHEMORADIATION--  NO RECURRENCE  . Hypertension   . History of CVA (cerebrovascular accident)     2003-  RIGHT MIDDLE CVA---   NO RESIDUAL  . Bilateral hydrocele 2012  . Diverticulosis 2013    by colonoscopy  . Prostate carcinoma DX  09/2012    T1c, brachytherapy/seed implant Karsten Ro, [place])  . History of hepatitis B     REMOTE AND INACTIVE  PER DOCUMENTATION  . History of syphilis     SECONDARY SYPHILITIS TX'D IN 1998  PER DOCUMENTATION  . History of tuberculosis     LATENT TB  TX'D X12  MONTHS IN 1995  . Depression   . Emphysema lung 03/2014     by CXR, remote smoking history    History  Substance Use Topics  . Smoking status: Former Smoker -- 0.30 packs/day for 35 years    Types: Cigarettes    Quit date: 03/04/2009  . Smokeless tobacco: Never Used  . Alcohol Use: No    Family History  Problem Relation Age of Onset  . Cancer Sister     breast  . Cancer Father     unsure  .  Cancer Brother 48    prostate, treated with seed implant  . CAD Neg Hx   . Stroke Neg Hx   . Diabetes Neg Hx   . Cancer Brother     prostate  . Cancer Brother     prostate  . Cancer Daughter     breast  . Cancer Other     prostate    No Known Allergies  Objective: Temp: 98.1 F (36.7 C) (07/21 1430) Temp Source: Oral (07/21 1430) BP: 144/84 mmHg (07/21 1430) Pulse Rate: 83 (07/21 1430) Body mass index is 20.87 kg/(m^2).  General: His weight is down 8 pounds to 133 Oral: Full dentures. No oropharyngeal lesions Skin: No rash Lymph nodes: No palpable adenopathy Lungs: Clear Cor: Regular S1 and S2 with no murmurs Abdomen: Soft and  nontender with no palpable masses Mood: Normal. He does not appear anxious or depressed  Lab Results Lab Results  Component Value Date   WBC 9.2 11/07/2014   HGB 13.7 11/07/2014   HCT 41.5 11/07/2014   MCV 84.2 11/07/2014   PLT 344 11/07/2014    Lab Results  Component Value Date   CREATININE 0.93 11/07/2014   BUN 9 11/07/2014   NA 142 11/07/2014   K 4.0 11/07/2014   CL 103 11/07/2014   CO2 28 11/07/2014    Lab Results  Component Value Date   ALT 10 11/07/2014   AST 14 11/07/2014   ALKPHOS 87 11/07/2014   BILITOT 0.3 11/07/2014    Lab Results  Component Value Date   CHOL 167 06/13/2014   HDL 52 06/13/2014   LDLCALC 104* 06/13/2014   TRIG 54 06/13/2014   CHOLHDL 3.2 06/13/2014    Lab Results HIV 1 RNA QUANT (copies/mL)  Date Value  11/07/2014 <20  06/13/2014 <20  11/15/2013 <20   CD4 T CELL ABS (/uL)  Date Value  11/12/2014 1000  06/13/2014 880  11/15/2013 980     Assessment: His HIV infection remains under excellent control. I will continue Atripla for now.  I do not know the explanation for his weight loss. He does have scales at home and ways regularly. I asked him to increase his food intake. I will have him back in 2 months to recheck his weight.  His blood pressure is under reasonably good control.  Plan: 1. Continue Atripla 2. Follow-up in 2 months   Michel Bickers, MD [facility] for [place] [phone] pager   [phone] cell 11/22/2014, 3:08 PM

## 2014-12-05 ENCOUNTER — Other Ambulatory Visit: Payer: Self-pay | Admitting: *Deleted

## 2014-12-05 DIAGNOSIS — B20 Human immunodeficiency virus [HIV] disease: Secondary | ICD-10-CM

## 2014-12-05 MED ORDER — EFAVIRENZ-EMTRICITAB-TENOFOVIR 600-200-300 MG PO TABS: 1.0000 | ORAL_TABLET | Freq: Every day | ORAL | Status: DC

## 2014-12-18 DIAGNOSIS — C61 Malignant neoplasm of prostate: Secondary | ICD-10-CM | POA: Diagnosis not present

## 2014-12-20 ENCOUNTER — Encounter: Payer: Self-pay | Admitting: Gastroenterology

## 2014-12-25 DIAGNOSIS — C61 Malignant neoplasm of prostate: Secondary | ICD-10-CM | POA: Diagnosis not present

## 2015-01-15 ENCOUNTER — Other Ambulatory Visit: Payer: Self-pay | Admitting: Internal Medicine

## 2015-01-15 DIAGNOSIS — I10 Essential (primary) hypertension: Secondary | ICD-10-CM

## 2015-01-21 ENCOUNTER — Other Ambulatory Visit: Payer: Self-pay | Admitting: *Deleted

## 2015-01-21 DIAGNOSIS — I10 Essential (primary) hypertension: Secondary | ICD-10-CM

## 2015-01-21 MED ORDER — HYDROCHLOROTHIAZIDE 50 MG PO TABS: 50.0000 mg | ORAL_TABLET | Freq: Every day | ORAL | Status: DC

## 2015-02-07 ENCOUNTER — Ambulatory Visit: Payer: Medicare Other | Admitting: Internal Medicine

## 2015-02-27 ENCOUNTER — Encounter: Payer: Self-pay | Admitting: Internal Medicine

## 2015-02-27 ENCOUNTER — Ambulatory Visit (INDEPENDENT_AMBULATORY_CARE_PROVIDER_SITE_OTHER): Payer: Medicare Other | Admitting: Internal Medicine

## 2015-02-27 VITALS — BP 136/88 | HR 84 | Temp 98.2°F | Wt 136.0 lb

## 2015-02-27 DIAGNOSIS — R634 Abnormal weight loss: Secondary | ICD-10-CM | POA: Diagnosis not present

## 2015-02-27 DIAGNOSIS — Z79899 Other long term (current) drug therapy: Secondary | ICD-10-CM | POA: Diagnosis not present

## 2015-02-27 DIAGNOSIS — Z23 Encounter for immunization: Secondary | ICD-10-CM | POA: Diagnosis not present

## 2015-02-27 DIAGNOSIS — B20 Human immunodeficiency virus [HIV] disease: Secondary | ICD-10-CM

## 2015-02-27 MED ORDER — ELVITEG-COBIC-EMTRICIT-TENOFAF 150-150-200-10 MG PO TABS: 1.0000 | ORAL_TABLET | Freq: Every day | ORAL | Status: DC

## 2015-02-27 NOTE — Assessment & Plan Note (Signed)
His HIV infection remains under excellent control. I discussed newer, safer antiretroviral options and will switch him from Atripla to [place]. He will take it each morning with breakfast. He will follow-up after lab work in 3 months.

## 2015-02-27 NOTE — Progress Notes (Signed)
Patient ID: Charles Daniel, male   DOB: [birth year], 74 y.o.   MRN: [id number]          Patient Active Problem List   Diagnosis Date Noted  . 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 Human immunodeficiency virus (HIV) disease ([place]) 05/15/2006    Priority: High  . Essential hypertension 05/15/2006    Priority: High  . CEREBROVASCULAR ACCIDENT 05/15/2006    Priority: High  . Advanced care planning/counseling discussion 06/26/2014  . 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 Prostate CA ([place]) 11/08/2012  . Medicare annual wellness visit, subsequent 08/02/2012  . 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 WEIGHT LOSS 12/18/2008  . CONSTIPATION 03/16/2008  . TUBERCULOSIS 05/15/2006  . SYPHILIS 05/15/2006  . 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  . Ex-smoker 05/15/2006  . HEPATITIS B, HX OF 05/15/2006  . GASTROINTESTINAL HEMORRHAGE, HX OF 05/15/2006    Patient's Medications  New Prescriptions   ELVITEGRAVIR-COBICISTAT-EMTRICITABINE-TENOFOVIR (GENVOYA) 150-150-200-10 MG TABS TABLET    Take 1 tablet by mouth daily with breakfast.  Previous Medications   HYDROCHLOROTHIAZIDE (HYDRODIURIL) 50 MG TABLET    Take 1 tablet (50 mg total) by mouth daily.  Modified Medications   No medications on file  Discontinued Medications   EFAVIRENZ-EMTRICITABINE-TENOFOVIR (ATRIPLA) 600-200-300 MG PER TABLET    Take 1 tablet by mouth daily.    Subjective: Charles Daniel is in for his routine HIV follow-up visit. As usual he never misses any doses of his Atripla or hydrochlorothiazide. He takes Atripla at bedtime and has no problems tolerating it. He takes hydrochlorothiazide each morning with breakfast. He states that his appetite is very good. He always eats 2 full meals daily.   Review of Systems: Pertinent items are noted in HPI.  Past Medical History  Diagnosis Date  . HIV infection ([place])     [place] (DR Batya Citron)  . History of colon  cancer     2007 --  S/P RECTOSIGMOID COLECTOMY--  NO CHEMORADIATION--  NO RECURRENCE  . Hypertension   . History of CVA (cerebrovascular accident)     2003-  RIGHT MIDDLE CVA---   NO RESIDUAL  . Bilateral hydrocele 2012  . Diverticulosis 2013    by colonoscopy  . Prostate carcinoma ([place]) DX  09/2012    T1c, brachytherapy/seed implant Karsten Ro, [place])  . History of hepatitis B     REMOTE AND INACTIVE  PER DOCUMENTATION  . History of syphilis     SECONDARY SYPHILITIS TX'D IN 1998  PER DOCUMENTATION  . History of tuberculosis     LATENT TB  TX'D X12  MONTHS IN 1995  . Depression   . Emphysema lung ([place]) 03/2014     by CXR, remote smoking history    Social History  Substance Use Topics  . Smoking status: Former Smoker -- 0.30 packs/day for 35 years    Types: Cigarettes    Quit date: 03/04/2009  . Smokeless tobacco: Never Used  . Alcohol Use: No    Family History  Problem Relation Age of Onset  . Cancer Sister     breast  . Cancer Father     unsure  . Cancer Brother 57    prostate, treated with seed implant  . CAD Neg Hx   . Stroke Neg Hx   . Diabetes Neg Hx   . Cancer Brother     prostate  . Cancer Brother     prostate  . Cancer Daughter  breast  . Cancer Other     prostate    No Known Allergies  Objective:  Filed Vitals:   02/27/15 1431  BP: 136/88  Pulse: 84  Temp: 98.2 F (36.8 C)  TempSrc: Oral  Weight: 136 lb (61.689 kg)   Body mass index is 21.3 kg/(m^2).  General: His weight is up 3 pounds since his last visit Oral: No oropharyngeal lesions Skin: No rash Lungs: Clear Cor: Regular S1 and S2 with no murmurs Mood: Normal. He does not appear anxious or depressed  Lab Results Lab Results  Component Value Date   WBC 9.2 11/07/2014   HGB 13.7 11/07/2014   HCT 41.5 11/07/2014   MCV 84.2 11/07/2014   PLT 344 11/07/2014    Lab Results  Component Value Date   CREATININE 0.93 11/07/2014   BUN 9 11/07/2014   NA 142 11/07/2014   K 4.0  11/07/2014   CL 103 11/07/2014   CO2 28 11/07/2014    Lab Results  Component Value Date   ALT 10 11/07/2014   AST 14 11/07/2014   ALKPHOS 87 11/07/2014   BILITOT 0.3 11/07/2014    Lab Results  Component Value Date   CHOL 167 06/13/2014   HDL 52 06/13/2014   LDLCALC 104* 06/13/2014   TRIG 54 06/13/2014   CHOLHDL 3.2 06/13/2014    Lab Results HIV 1 RNA QUANT (copies/mL)  Date Value  11/07/2014 <20  06/13/2014 <20  11/15/2013 <20   CD4 T CELL ABS (/uL)  Date Value  11/12/2014 1000  06/13/2014 880  11/15/2013 980     Problem List Items Addressed This Visit      High   Human immunodeficiency virus (HIV) disease ([place])    His HIV infection remains under excellent control. I discussed newer, safer antiretroviral options and will switch him from Atripla to [place]. He will take it each morning with breakfast. He will follow-up after lab work in 3 months.      Relevant Medications   elvitegravir-cobicistat-emtricitabine-tenofovir (GENVOYA) 150-150-200-10 MG TABS tablet   Other Relevant Orders   T-helper cell (CD4)- ([facility] clinic only)   HIV 1 RNA quant-no reflex-bld   CBC   Comprehensive metabolic panel   Lipid panel   RPR     Unprioritized   WEIGHT LOSS    He has not had any further weight loss. We will simply continue to monitor his weight visit by visit.       Other Visit Diagnoses    Encounter for long-term (current) use of medications    -  Primary    Relevant Orders    Lipid panel         Michel Bickers, MD [facility] for [place] [phone] pager   [phone] cell 02/27/2015, 2:46 PM

## 2015-02-27 NOTE — Assessment & Plan Note (Signed)
He has not had any further weight loss. We will simply continue to monitor his weight visit by visit.

## 2015-05-16 ENCOUNTER — Other Ambulatory Visit: Payer: Medicare Other

## 2015-05-16 DIAGNOSIS — B20 Human immunodeficiency virus [HIV] disease: Secondary | ICD-10-CM | POA: Diagnosis not present

## 2015-05-16 DIAGNOSIS — Z79899 Other long term (current) drug therapy: Secondary | ICD-10-CM | POA: Diagnosis not present

## 2015-05-16 LAB — CBC
HEMATOCRIT: 43.3 % (ref 39.0–52.0)
HEMOGLOBIN: 14.3 g/dL (ref 13.0–17.0)
MCH: 28 pg (ref 26.0–34.0)
MCHC: 33 g/dL (ref 30.0–36.0)
MCV: 84.9 fL (ref 78.0–100.0)
MPV: 10.9 fL (ref 8.6–12.4)
PLATELETS: 256 10*3/uL (ref 150–400)
RBC: 5.1 MIL/uL (ref 4.22–5.81)
RDW: 15.5 % (ref 11.5–15.5)
WBC: 8.2 10*3/uL (ref 4.0–10.5)

## 2015-05-16 LAB — LIPID PANEL
Cholesterol: 169 mg/dL (ref 125–200)
HDL: 53 mg/dL (ref >=40)
LDL Cholesterol: 98 mg/dL (ref <130)
Total CHOL/HDL Ratio: 3.2 Ratio (ref <=5.0)
Triglycerides: 92 mg/dL (ref <150)
VLDL: 18 mg/dL (ref <30)

## 2015-05-16 LAB — COMPREHENSIVE METABOLIC PANEL
ALT: 12 U/L (ref 9–46)
AST: 18 U/L (ref 10–35)
Albumin: 4 g/dL (ref 3.6–5.1)
Alkaline Phosphatase: 65 U/L (ref 40–115)
BUN: 9 mg/dL (ref 7–25)
CHLORIDE: 99 mmol/L (ref 98–110)
CO2: 31 mmol/L (ref 20–31)
CREATININE: 1.1 mg/dL (ref 0.70–1.18)
Calcium: 9.2 mg/dL (ref 8.6–10.3)
Glucose, Bld: 83 mg/dL (ref 65–99)
Potassium: 3.3 mmol/L — ABNORMAL LOW (ref 3.5–5.3)
SODIUM: 139 mmol/L (ref 135–146)
Total Bilirubin: 0.4 mg/dL (ref 0.2–1.2)
Total Protein: 7.3 g/dL (ref 6.1–8.1)

## 2015-05-17 LAB — HIV-1 RNA QUANT-NO REFLEX-BLD: HIV-1 RNA Quant, Log: <1.3 Log copies/mL (ref <1.30)

## 2015-05-17 LAB — RPR TITER: RPR Titer: 1:2 {titer}

## 2015-05-17 LAB — T-HELPER CELL (CD4) - (RCID CLINIC ONLY)
CD4 T CELL ABS: 990 /uL (ref 400–2700)
CD4 T CELL HELPER: 39 % (ref 33–55)

## 2015-05-17 LAB — RPR: RPR: REACTIVE — AB

## 2015-05-17 LAB — FLUORESCENT TREPONEMAL AB(FTA)-IGG-BLD: FLUORESCENT TREPONEMAL ABS: REACTIVE — AB

## 2015-05-29 MED FILL — GENVOYA TABLET: 150-150-200 | 30 days supply | Qty: 30 | Fill #3

## 2015-05-30 ENCOUNTER — Ambulatory Visit (INDEPENDENT_AMBULATORY_CARE_PROVIDER_SITE_OTHER): Payer: Medicare Other | Admitting: Internal Medicine

## 2015-05-30 ENCOUNTER — Encounter: Payer: Self-pay | Admitting: Internal Medicine

## 2015-05-30 VITALS — BP 167/93 | HR 79 | Temp 98.2°F | Wt 144.0 lb

## 2015-05-30 DIAGNOSIS — B20 Human immunodeficiency virus [HIV] disease: Secondary | ICD-10-CM

## 2015-05-30 DIAGNOSIS — Z79899 Other long term (current) drug therapy: Secondary | ICD-10-CM | POA: Diagnosis not present

## 2015-05-30 NOTE — Assessment & Plan Note (Signed)
His infection remains under perfect control. He will continue Genvoya and follow-up after lab work in 6 months.  I encouraged him to call his primary care physician and schedule a follow-up visit. His blood pressure is not at goal and he has some mild hypokalemia related to his hydrochlorothiazide therapy.

## 2015-05-30 NOTE — Progress Notes (Signed)
Patient Active Problem List   Diagnosis Date Noted  . 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 Human immunodeficiency virus (HIV) disease ([place]) 05/15/2006    Priority: High  . Essential hypertension 05/15/2006    Priority: High  . CEREBROVASCULAR ACCIDENT 05/15/2006    Priority: High  . Advanced care planning/counseling discussion 06/26/2014  . 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 Prostate CA ([place]) 11/08/2012  . Medicare annual wellness visit, subsequent 08/02/2012  . 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 WEIGHT LOSS 12/18/2008  . CONSTIPATION 03/16/2008  . TUBERCULOSIS 05/15/2006  . SYPHILIS 05/15/2006  . 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  . Ex-smoker 05/15/2006  . HEPATITIS B, HX OF 05/15/2006  . GASTROINTESTINAL HEMORRHAGE, HX OF 05/15/2006    Patient's Medications  New Prescriptions   No medications on file  Previous Medications   ELVITEGRAVIR-COBICISTAT-EMTRICITABINE-TENOFOVIR (GENVOYA) 150-150-200-10 MG TABS TABLET    Take 1 tablet by mouth daily with breakfast.   HYDROCHLOROTHIAZIDE (HYDRODIURIL) 50 MG TABLET    Take 1 tablet (50 mg total) by mouth daily.  Modified Medications   No medications on file  Discontinued Medications   No medications on file    Subjective: Charles Daniel is in for his routine HIV follow-up visit. He denies having any problems currently. He takes his Genvoya every morning with breakfast. He has no problems tolerating it and does not recall missing any doses. He also continues to take his hydrochlorothiazide. He has not seen his primary care physician recently.   Review of Systems: Review of Systems  Constitutional: Negative for fever, chills, weight loss, malaise/fatigue and diaphoresis.  HENT: Negative for sore throat.   Respiratory: Negative for cough, sputum production and shortness of breath.   Cardiovascular: Negative for chest pain.  Gastrointestinal: Negative for nausea, vomiting,  diarrhea and blood in stool.  Genitourinary: Negative for dysuria.  Musculoskeletal: Negative for myalgias and joint pain.  Skin: Negative for rash.  Psychiatric/Behavioral: Negative for depression. The patient is not nervous/anxious.     Past Medical History  Diagnosis Date  . HIV infection ([place])     [place] (DR Azariya Freeman)  . History of colon cancer     2007 --  S/P RECTOSIGMOID COLECTOMY--  NO CHEMORADIATION--  NO RECURRENCE  . Hypertension   . History of CVA (cerebrovascular accident)     2003-  RIGHT MIDDLE CVA---   NO RESIDUAL  . Bilateral hydrocele 2012  . Diverticulosis 2013    by colonoscopy  . Prostate carcinoma ([place]) DX  09/2012    T1c, brachytherapy/seed implant Charles Daniel, [place])  . History of hepatitis B     REMOTE AND INACTIVE  PER DOCUMENTATION  . History of syphilis     SECONDARY SYPHILITIS TX'D IN 1998  PER DOCUMENTATION  . History of tuberculosis     LATENT TB  TX'D X12  MONTHS IN 1995  . Depression   . Emphysema lung ([place]) 03/2014     by CXR, remote smoking history    Social History  Substance Use Topics  . Smoking status: Former Smoker -- 0.30 packs/day for 35 years    Types: Cigarettes    Quit date: 03/04/2009  . Smokeless tobacco: Never Used  . Alcohol Use: No    Family History  Problem Relation Age of Onset  . Cancer Sister     breast  . Cancer Father     unsure  . Cancer Brother  66    prostate, treated with seed implant  . CAD Neg Hx   . Stroke Neg Hx   . Diabetes Neg Hx   . Cancer Brother     prostate  . Cancer Brother     prostate  . Cancer Daughter     breast  . Cancer Other     prostate    No Known Allergies  Objective:  Filed Vitals:   05/30/15 1425  BP: 167/93  Pulse: 79  Temp: 98.2 F (36.8 C)  TempSrc: Oral  Weight: 144 lb (65.318 kg)   Body mass index is 22.55 kg/(m^2).  Physical Exam  Constitutional: He is oriented to person, place, and time.  He is smiling and in good  spirits as usual.  Eyes: Conjunctivae are normal.  Cardiovascular: Normal rate and regular rhythm.   No murmur heard. Pulmonary/Chest: Breath sounds normal.  Abdominal: Soft. There is no tenderness.  Musculoskeletal: Normal range of motion.  Neurological: He is alert and oriented to person, place, and time.  Skin: No rash noted.  Psychiatric: Mood and affect normal.    Lab Results Lab Results  Component Value Date   WBC 8.2 05/16/2015   HGB 14.3 05/16/2015   HCT 43.3 05/16/2015   MCV 84.9 05/16/2015   PLT 256 05/16/2015    Lab Results  Component Value Date   CREATININE 1.10 05/16/2015   BUN 9 05/16/2015   NA 139 05/16/2015   K 3.3* 05/16/2015   CL 99 05/16/2015   CO2 31 05/16/2015    Lab Results  Component Value Date   ALT 12 05/16/2015   AST 18 05/16/2015   ALKPHOS 65 05/16/2015   BILITOT 0.4 05/16/2015    Lab Results  Component Value Date   CHOL 169 05/16/2015   HDL 53 05/16/2015   LDLCALC 98 05/16/2015   TRIG 92 05/16/2015   CHOLHDL 3.2 05/16/2015    Lab Results HIV 1 RNA QUANT (copies/mL)  Date Value  05/16/2015 <20  11/07/2014 <20  06/13/2014 <20   CD4 T CELL ABS (/uL)  Date Value  05/16/2015 990  11/12/2014 1000  06/13/2014 880      Problem List Items Addressed This Visit      High   Human immunodeficiency virus (HIV) disease ([place])    His infection remains under perfect control. He will continue Genvoya and follow-up after lab work in 6 months.  I encouraged him to call his primary care physician and schedule a follow-up visit. His blood pressure is not at goal and he has some mild hypokalemia related to his hydrochlorothiazide therapy.      Relevant Orders   T-helper cell (CD4)- ([facility] clinic only)   HIV 1 RNA quant-no reflex-bld   CBC   Comprehensive metabolic panel   Lipid panel   RPR    Other Visit Diagnoses    Encounter for long-term (current) use of medications    -  Primary    Relevant Orders    Lipid panel         Michel Bickers, MD [facility] for [place] [phone] pager   [phone] cell 05/30/2015, 2:50 PM

## 2015-06-21 ENCOUNTER — Other Ambulatory Visit (INDEPENDENT_AMBULATORY_CARE_PROVIDER_SITE_OTHER): Payer: Medicare Other

## 2015-06-21 ENCOUNTER — Other Ambulatory Visit: Payer: Self-pay | Admitting: Family Medicine

## 2015-06-21 DIAGNOSIS — E876 Hypokalemia: Secondary | ICD-10-CM

## 2015-06-21 DIAGNOSIS — C61 Malignant neoplasm of prostate: Secondary | ICD-10-CM

## 2015-06-21 LAB — POTASSIUM: POTASSIUM: 3.8 meq/L (ref 3.5–5.1)

## 2015-06-24 DIAGNOSIS — C61 Malignant neoplasm of prostate: Secondary | ICD-10-CM | POA: Diagnosis not present

## 2015-06-27 MED FILL — GENVOYA TABLET: 150-150-200 | 30 days supply | Qty: 30 | Fill #4

## 2015-06-28 ENCOUNTER — Encounter: Payer: Self-pay | Admitting: Family Medicine

## 2015-06-28 ENCOUNTER — Ambulatory Visit (INDEPENDENT_AMBULATORY_CARE_PROVIDER_SITE_OTHER): Payer: Medicare Other | Admitting: Family Medicine

## 2015-06-28 VITALS — BP 154/90 | HR 75 | Temp 98.0°F | Ht 66.0 in | Wt 139.8 lb

## 2015-06-28 DIAGNOSIS — B20 Human immunodeficiency virus [HIV] disease: Secondary | ICD-10-CM

## 2015-06-28 DIAGNOSIS — I10 Essential (primary) hypertension: Secondary | ICD-10-CM

## 2015-06-28 DIAGNOSIS — Z0000 Encounter for general adult medical examination without abnormal findings: Secondary | ICD-10-CM | POA: Diagnosis not present

## 2015-06-28 DIAGNOSIS — Z7189 Other specified counseling: Secondary | ICD-10-CM | POA: Diagnosis not present

## 2015-06-28 DIAGNOSIS — J069 Acute upper respiratory infection, unspecified: Secondary | ICD-10-CM | POA: Insufficient documentation

## 2015-06-28 MED ORDER — AMLODIPINE BESYLATE 5 MG PO TABS: 5.0000 mg | ORAL_TABLET | Freq: Every day | ORAL | Status: DC

## 2015-06-28 MED ORDER — HYDROCHLOROTHIAZIDE 25 MG PO TABS: 25.0000 mg | ORAL_TABLET | Freq: Every day | ORAL | Status: DC

## 2015-06-28 MED FILL — HYDROCHLOROTHIAZIDE 25 MG T: 25 | 90 days supply | Qty: 90 | Fill #0

## 2015-06-28 MED FILL — AMLODIPINE BESYLATE 5 MG TA: 5 | 90 days supply | Qty: 90 | Fill #0

## 2015-06-28 NOTE — Assessment & Plan Note (Signed)

## 2015-06-28 NOTE — Assessment & Plan Note (Signed)
Chronic. Uncontrolled on high dose hctz, with hypokalemia. Will decrease hctz to 25mg  daily and add amlodipine 5mg  daily. RTC 6 mo f/u visit.

## 2015-06-28 NOTE — Progress Notes (Signed)
BP 154/90 mmHg  Pulse 75  Temp(Src) 98 F (36.7 C) (Oral)  Ht 5\' 6"  (1.676 m)  Wt 139 lb 12 oz (63.39 kg)  BMI 22.57 kg/m2  SpO2 97%   CC: medicare wellness visit Subjective:    Patient ID: Charles Daniel, male    DOB: 1940-11-08, 75 y.o.   MRN: [id number]  HPI: Charles Daniel is a 75 y.o. male presenting on 06/28/2015 for Annual Exam   Bad cold for last 3 weeks.  HTN - bp staying elevated. Does not check bp at home. No HA, vision changes, CP/tightness, SOB, leg swelling. Occasional dizziness.  Hearing screen - passed Vision screen - passed Fall risk screen - passed Depression screen - passed  Preventative: Procedure COLONOSCOPY Date: 08/2011 3 polyps, diverticulosis, rec rpt 5 yrs [name]) H/o prostate cancer - sees Ottelin yearly - appt next week s/p radioactive seed implant 01/2013. Lung cancer screening - not candidate - smoking 1/3 ppd for 30 yrs Flu shot - yearly Pneumovax - 09/2010 and 2007. prevnar ?due Tetanus shot - 08/2012 Shingles shot - declines for financial reasons Advanced directive discussion - doesn't have set up. Would like wife to be HCPOA. Doesn't want prolonged life support if terminal condition.  Seat belt use discussed No changing moles on skin  Caffeine: 3 sodas/day Lives with wife. 2 grown sons. 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 Interim medical history since our last visit reviewed. Allergies and medications reviewed and updated. Current Outpatient Prescriptions on File Prior to Visit  Medication Sig  . elvitegravir-cobicistat-emtricitabine-tenofovir (GENVOYA) 150-150-200-10 MG TABS tablet Take 1 tablet by mouth daily with breakfast.   No current facility-administered medications on file prior to visit.      Review of Systems Per HPI unless specifically indicated in ROS section     Objective:    BP 154/90 mmHg  Pulse 75  Temp(Src) 98 F (36.7 C) (Oral)  Ht 5\' 6"  (1.676 m)  Wt 139 lb 12 oz (63.39 kg)  BMI 22.57 kg/m2  SpO2 97%  Wt Readings from Last 3 Encounters:  06/28/15 139 lb 12 oz (63.39 kg)  05/30/15 144 lb (65.318 kg)  02/27/15 136 lb (61.689 kg)    Physical Exam  Constitutional: He is oriented to person, place, and time. He appears well-developed and well-nourished. No distress.  HENT:  Head: Normocephalic and atraumatic.  Right Ear: Hearing, tympanic membrane, external ear and ear canal normal.  Left Ear: Hearing, tympanic membrane, external ear and ear canal normal.  Nose: Nose normal.  Mouth/Throat: Uvula is midline, oropharynx is clear and moist and mucous membranes are normal. No oropharyngeal exudate, posterior oropharyngeal edema or posterior oropharyngeal erythema.  Eyes: Conjunctivae and EOM are normal. Pupils are equal, round, and reactive to light. No scleral icterus.  Neck: Normal range of motion. Neck supple. Carotid bruit is not present. No thyromegaly present.  Cardiovascular: Normal rate, regular rhythm, normal heart sounds and intact distal pulses.   No murmur heard. Pulses:      Radial pulses are 2+ on the right side, and 2+ on the left side.  Pulmonary/Chest: Effort normal and breath sounds normal. No respiratory distress. He has no wheezes. He has no rales.  Abdominal: Soft. Bowel sounds are normal. He exhibits no distension and no mass. There  is no tenderness. There is no rebound and no guarding.  Genitourinary:  DRE deferred - per urology  Musculoskeletal: Normal range of motion. He exhibits no edema.  Lymphadenopathy:    He has no cervical adenopathy.  Neurological: He is alert and oriented to person, place, and time.  CN grossly intact, station and gait intact Recall 2/3, 2/3 with cue Calculation 5/5 serial 3s  Skin: Skin is warm and dry.  No rash noted.  Psychiatric: He has a normal mood and affect. His behavior is normal. Judgment and thought content normal.  Nursing note and vitals reviewed.  Results for orders placed or performed in visit on 06/21/15  Potassium  Result Value Ref Range   Potassium 3.8 3.5 - 5.1 mEq/L      Assessment & Plan:   Problem List Items Addressed This Visit    Viral URI    Anticipate viral. Discussed corcedin HBP use.      Medicare annual wellness visit, subsequent - Primary    I have personally reviewed the Medicare Annual Wellness questionnaire and have noted 1. The patient's medical and social history 2. Their use of alcohol, tobacco or illicit drugs 3. Their current medications and supplements 4. The patient's functional ability including ADL's, fall risks, home safety risks and hearing or visual impairment. Cognitive function has been assessed and addressed as indicated.  5. Diet and physical activity 6. Evidence for depression or mood disorders The patients weight, height, BMI have been recorded in the chart. I have made referrals, counseling and provided education to the patient based on review of the above and I have provided the pt with a written personalized care plan for preventive services. Provider list updated.. See scanned questionairre as needed for further documentation. Reviewed preventative protocols and updated unless pt declined.       Human immunodeficiency virus (HIV) disease ([place])    Appreciate ID care of patient.      Essential hypertension    Chronic. Uncontrolled on high dose hctz, with hypokalemia. Will decrease hctz to 25mg  daily and add amlodipine 5mg  daily. RTC 6 mo f/u visit.      Relevant Medications   hydrochlorothiazide (HYDRODIURIL) 25 MG tablet   amLODipine (NORVASC) 5 MG tablet   Advanced care planning/counseling discussion    Advanced directive discussion - doesn't have set up. Would like wife to be HCPOA. Doesn't want prolonged life support  if terminal condition.           Follow up plan: Return in about 6 months (around 12/26/2015), or as needed, for follow up visit.

## 2015-06-28 NOTE — Assessment & Plan Note (Signed)
Anticipate viral. Discussed corcedin HBP use.

## 2015-06-28 NOTE — Progress Notes (Signed)
Pre visit review using our clinic review tool, if applicable. No additional management support is needed unless otherwise documented below in the visit note. 

## 2015-06-28 NOTE — Assessment & Plan Note (Addendum)
Advanced directive discussion - doesn't have set up. Would like wife to be HCPOA. Doesn't want prolonged life support if terminal condition.

## 2015-06-28 NOTE — Patient Instructions (Addendum)
For colds - use corcedin HBP ok to take with high blood pressure. Can also use tylenol and plain guaifenesin or mucinex.  Ask Dr Megan Salon about prevnar (last pneumonia shot). For blood pressure - let's decrease hydrochlorothiazide to 25mg  daily, add amlodipine 5mg  daily. Ok to take both at the same time. Let me know how you do with new medicine. Return as needed or in 6 mo for follow up blood pressure  Health Maintenance, Male A healthy lifestyle and preventative care can promote health and wellness.  Maintain regular health, dental, and eye exams.  Eat a healthy diet. Foods like vegetables, fruits, whole grains, low-fat dairy products, and lean protein foods contain the nutrients you need and are low in calories. Decrease your intake of foods high in solid fats, added sugars, and salt. Get information about a proper diet from your health care provider, if necessary.  Regular physical exercise is one of the most important things you can do for your health. Most adults should get at least 150 minutes of moderate-intensity exercise (any activity that increases your heart rate and causes you to sweat) each week. In addition, most adults need muscle-strengthening exercises on 2 or more days a week.   Maintain a healthy weight. The body mass index (BMI) is a screening tool to identify possible weight problems. It provides an estimate of body fat based on height and weight. Your health care provider can find your BMI and can help you achieve or maintain a healthy weight. For males 20 years and older:  A BMI below 18.5 is considered underweight.  A BMI of 18.5 to 24.9 is normal.  A BMI of 25 to 29.9 is considered overweight.  A BMI of 30 and above is considered obese.  Maintain normal blood lipids and cholesterol by exercising and minimizing your intake of saturated fat. Eat a balanced diet with plenty of fruits and vegetables. Blood tests for lipids and cholesterol should begin at age 64 and be  repeated every 5 years. If your lipid or cholesterol levels are high, you are over age 37, or you are at high risk for heart disease, you may need your cholesterol levels checked more frequently.Ongoing high lipid and cholesterol levels should be treated with medicines if diet and exercise are not working.  If you smoke, find out from your health care provider how to quit. If you do not use tobacco, do not start.  Lung cancer screening is recommended for adults aged 30-80 years who are at high risk for developing lung cancer because of a history of smoking. A yearly low-dose CT scan of the lungs is recommended for people who have at least a 30-pack-year history of smoking and are current smokers or have quit within the past 15 years. A pack year of smoking is smoking an average of 1 pack of cigarettes a day for 1 year (for example, a 30-pack-year history of smoking could mean smoking 1 pack a day for 30 years or 2 packs a day for 15 years). Yearly screening should continue until the smoker has stopped smoking for at least 15 years. Yearly screening should be stopped for people who develop a health problem that would prevent them from having lung cancer treatment.  If you choose to drink alcohol, do not have more than 2 drinks per day. One drink is considered to be 12 oz (360 mL) of beer, 5 oz (150 mL) of wine, or 1.5 oz (45 mL) of liquor.  Avoid the use of  street drugs. Do not share needles with anyone. Ask for help if you need support or instructions about stopping the use of drugs.  High blood pressure causes heart disease and increases the risk of stroke. High blood pressure is more likely to develop in:  People who have blood pressure in the end of the normal range (100-139/85-89 mm Hg).  People who are overweight or obese.  People who are African American.  If you are 23-44 years of age, have your blood pressure checked every 3-5 years. If you are 53 years of age or older, have your blood  pressure checked every year. You should have your blood pressure measured twice--once when you are at a hospital or clinic, and once when you are not at a hospital or clinic. Record the average of the two measurements. To check your blood pressure when you are not at a hospital or clinic, you can use:  An automated blood pressure machine at a pharmacy.  A home blood pressure monitor.  If you are 36-54 years old, ask your health care provider if you should take aspirin to prevent heart disease.  Diabetes screening involves taking a blood sample to check your fasting blood sugar level. This should be done once every 3 years after age 64 if you are at a normal weight and without risk factors for diabetes. Testing should be considered at a younger age or be carried out more frequently if you are overweight and have at least 1 risk factor for diabetes.  Colorectal cancer can be detected and often prevented. Most routine colorectal cancer screening begins at the age of 48 and continues through age 66. However, your health care provider may recommend screening at an earlier age if you have risk factors for colon cancer. On a yearly basis, your health care provider may provide home test kits to check for hidden blood in the stool. A small camera at the end of a tube may be used to directly examine the colon (sigmoidoscopy or colonoscopy) to detect the earliest forms of colorectal cancer. Talk to your health care provider about this at age 49 when routine screening begins. A direct exam of the colon should be repeated every 5-10 years through age 68, unless early forms of precancerous polyps or small growths are found.  People who are at an increased risk for hepatitis B should be screened for this virus. You are considered at high risk for hepatitis B if:  You were born in a country where hepatitis B occurs often. Talk with your health care provider about which countries are considered high risk.  Your  parents were born in a high-risk country and you have not received a shot to protect against hepatitis B (hepatitis B vaccine).  You have HIV or AIDS.  You use needles to inject street drugs.  You live with, or have sex with, someone who has hepatitis B.  You are a man who has sex with other men (MSM).  You get hemodialysis treatment.  You take certain medicines for conditions like cancer, organ transplantation, and autoimmune conditions.  Hepatitis C blood testing is recommended for all people born from [birth year] through 1966 and any individual with known risk factors for hepatitis C.  Healthy men should no longer receive prostate-specific antigen (PSA) blood tests as part of routine cancer screening. Talk to your health care provider about prostate cancer screening.  Testicular cancer screening is not recommended for adolescents or adult males who have no symptoms. Screening  includes self-exam, a health care provider exam, and other screening tests. Consult with your health care provider about any symptoms you have or any concerns you have about testicular cancer.  Practice safe sex. Use condoms and avoid high-risk sexual practices to reduce the spread of sexually transmitted infections (STIs).  You should be screened for STIs, including gonorrhea and chlamydia if:  You are sexually active and are younger than 24 years.  You are older than 24 years, and your health care provider tells you that you are at risk for this type of infection.  Your sexual activity has changed since you were last screened, and you are at an increased risk for chlamydia or gonorrhea. Ask your health care provider if you are at risk.  If you are at risk of being infected with HIV, it is recommended that you take a prescription medicine daily to prevent HIV infection. This is called pre-exposure prophylaxis (PrEP). You are considered at risk if:  You are a man who has sex with other men (MSM).  You are a  heterosexual man who is sexually active with multiple partners.  You take drugs by injection.  You are sexually active with a partner who has HIV.  Talk with your health care provider about whether you are at high risk of being infected with HIV. If you choose to begin PrEP, you should first be tested for HIV. You should then be tested every 3 months for as long as you are taking PrEP.  Use sunscreen. Apply sunscreen liberally and repeatedly throughout the day. You should seek shade when your shadow is shorter than you. Protect yourself by wearing long sleeves, pants, a wide-brimmed hat, and sunglasses year round whenever you are outdoors.  Tell your health care provider of new moles or changes in moles, especially if there is a change in shape or color. Also, tell your health care provider if a mole is larger than the size of a pencil eraser.  A one-time screening for abdominal aortic aneurysm (AAA) and surgical repair of large AAAs by ultrasound is recommended for men aged 52-75 years who are current or former smokers.  Stay current with your vaccines (immunizations).   This information is not intended to replace advice given to you by your health care provider. Make sure you discuss any questions you have with your health care provider.   Document Released: 10/17/2007 Document Revised: 05/11/2014 Document Reviewed: 09/15/2010 Elsevier Interactive Patient Education [employer].

## 2015-06-28 NOTE — Assessment & Plan Note (Signed)
Appreciate ID care of patient.  

## 2015-07-01 DIAGNOSIS — Z0000 Encounter for general adult medical examination without abnormal findings: Secondary | ICD-10-CM | POA: Diagnosis not present

## 2015-07-01 DIAGNOSIS — C61 Malignant neoplasm of prostate: Secondary | ICD-10-CM | POA: Diagnosis not present

## 2015-07-07 ENCOUNTER — Encounter: Payer: Self-pay | Admitting: Family Medicine

## 2015-07-30 MED FILL — GENVOYA TABLET: 150-150-200 | 30 days supply | Qty: 30 | Fill #5

## 2015-08-26 MED FILL — GENVOYA TABLET: 150-150-200 | 30 days supply | Qty: 30 | Fill #6

## 2015-09-23 MED FILL — GENVOYA TABLET: 150-150-200 | 30 days supply | Qty: 30 | Fill #7

## 2015-10-18 ENCOUNTER — Ambulatory Visit (INDEPENDENT_AMBULATORY_CARE_PROVIDER_SITE_OTHER): Payer: Medicare Other | Admitting: Family Medicine

## 2015-10-18 ENCOUNTER — Encounter: Payer: Self-pay | Admitting: Family Medicine

## 2015-10-18 ENCOUNTER — Ambulatory Visit (INDEPENDENT_AMBULATORY_CARE_PROVIDER_SITE_OTHER)
Admission: RE | Admit: 2015-10-18 | Discharge: 2015-10-18 | Disposition: A | Discharge status: Home / Self Care | Payer: Medicare Other | Source: Ambulatory Visit | Attending: Family Medicine | Admitting: Family Medicine

## 2015-10-18 VITALS — BP 144/84 | HR 84 | Temp 98.4°F | Wt 138.0 lb

## 2015-10-18 DIAGNOSIS — J439 Emphysema, unspecified: Secondary | ICD-10-CM

## 2015-10-18 DIAGNOSIS — R05 Cough: Secondary | ICD-10-CM | POA: Diagnosis not present

## 2015-10-18 DIAGNOSIS — B20 Human immunodeficiency virus [HIV] disease: Secondary | ICD-10-CM

## 2015-10-18 DIAGNOSIS — J441 Chronic obstructive pulmonary disease with (acute) exacerbation: Secondary | ICD-10-CM | POA: Insufficient documentation

## 2015-10-18 DIAGNOSIS — R059 Cough, unspecified: Secondary | ICD-10-CM

## 2015-10-18 MED ORDER — PREDNISONE 20 MG PO TABS: ORAL_TABLET | ORAL | Status: DC

## 2015-10-18 MED ORDER — LEVOFLOXACIN 500 MG PO TABS: 500.0000 mg | ORAL_TABLET | Freq: Every day | ORAL | Status: DC

## 2015-10-18 MED ORDER — ALBUTEROL SULFATE HFA 108 (90 BASE) MCG/ACT IN AERS: 2.0000 | INHALATION_SPRAY | Freq: Four times a day (QID) | RESPIRATORY_TRACT | Status: DC | PRN

## 2015-10-18 NOTE — Assessment & Plan Note (Addendum)
Clinically consistent with COPD exacerbation in ex smoker.  Has never needed breathing medication. Treat with levaquin/prednisone and albuterol inh PRN dyspnea/wheeze.  Check CXR today - ?haziness RLL with chronic bronchitic changes. Update if not improving with treatment.  Pt agrees with plan.

## 2015-10-18 NOTE — Progress Notes (Signed)
Pre visit review using our clinic review tool, if applicable. No additional management support is needed unless otherwise documented below in the visit note. 

## 2015-10-18 NOTE — Patient Instructions (Signed)
I do think you have COPD exacerbation - treat with levaquin antibiotic for 7 days, prendisone taper for 7 days, may use albuterol inhaler as needed for shortness of breath or wheezing.  Push fluids and rest. Watch for fevers or chills or worsening cough or trouble breathing - if this happens, return to be seen or seek urgent care over weekend.  Good to see you today, call us with questions.

## 2015-10-18 NOTE — Assessment & Plan Note (Signed)
Consistently very well controlled, latest CD4 count 900s.

## 2015-10-18 NOTE — Progress Notes (Signed)
BP 144/84 mmHg  Pulse 84  Temp(Src) 98.4 F (36.9 C) (Oral)  Wt 138 lb (62.596 kg)  SpO2 96%   CC: congestion "I'm sick" Subjective:    Patient ID: Charles Daniel, male    DOB: 02-09-1941, 76 y.o.   MRN: [id number]  HPI: Charles Daniel is a 75 y.o. male presenting on 10/18/2015 for URI   5d h/o coughing up clear mucous, body aches, congestion, sinus drainage. + wheezing, dyspneic, more sputum production than normal.   No fevers/chills, ear or tooth pain, HA, PNDrainage or ST.  No sick contacts at home.  No smokers at home.  Known emphysema by piror CXR. No h/o asthma.   Treating with vicks vapo rub.   Last CD4 count 990 (05/2015). zpack don't work for him.   Relevant past medical, surgical, family and social history reviewed and updated as indicated. Interim medical history since our last visit reviewed. Allergies and medications reviewed and updated. Current Outpatient Prescriptions on File Prior to Visit  Medication Sig  . amLODipine (NORVASC) 5 MG tablet Take 1 tablet (5 mg total) by mouth daily.  Marland Kitchen elvitegravir-cobicistat-emtricitabine-tenofovir (GENVOYA) 150-150-200-10 MG TABS tablet Take 1 tablet by mouth daily with breakfast.  . hydrochlorothiazide (HYDRODIURIL) 25 MG tablet Take 1 tablet (25 mg total) by mouth daily.   No current facility-administered medications on file prior to visit.    Review of Systems Per HPI unless specifically indicated in ROS section     Objective:    BP 144/84 mmHg  Pulse 84  Temp(Src) 98.4 F (36.9 C) (Oral)  Wt 138 lb (62.596 kg)  SpO2 96%  Wt Readings from Last 3 Encounters:  10/18/15 138 lb (62.596 kg)  06/28/15 139 lb 12 oz (63.39 kg)  05/30/15 144 lb (65.318 kg)    Physical Exam  Constitutional: He appears well-developed and well-nourished. No distress.  HENT:  Head: Normocephalic and atraumatic.  Right Ear: Hearing, tympanic membrane, external ear and ear canal normal.  Left Ear: Hearing, tympanic membrane, external  ear and ear canal normal.  Nose: Mucosal edema present. No rhinorrhea. Right sinus exhibits no maxillary sinus tenderness and no frontal sinus tenderness. Left sinus exhibits no maxillary sinus tenderness and no frontal sinus tenderness.  Mouth/Throat: Uvula is midline and mucous membranes are normal. Posterior oropharyngeal erythema present. No oropharyngeal exudate, posterior oropharyngeal edema or tonsillar abscesses.  Cerumen L>R  Eyes: Conjunctivae and EOM are normal. Pupils are equal, round, and reactive to light. No scleral icterus.  Neck: Normal range of motion. Neck supple.  Cardiovascular: Normal rate, regular rhythm, normal heart sounds and intact distal pulses.   No murmur heard. Pulmonary/Chest: Effort normal. No respiratory distress. He has decreased breath sounds. He has wheezes. He has rhonchi. He has rales.  Diffuse coarse sounds with rhonchi, rales, exp wheezing Harsh wet cough present  Lymphadenopathy:    He has no cervical adenopathy.  Skin: Skin is warm and dry. No rash noted.  Nursing note and vitals reviewed.  Lab Results  Component Value Date   CREATININE 1.10 05/16/2015       Assessment & Plan:   Problem List Items Addressed This Visit    Human immunodeficiency virus (HIV) disease ([place])    Consistently very well controlled, latest CD4 count 900s.       COPD exacerbation ([place]) - Primary    Clinically consistent with COPD exacerbation in ex smoker.  Has never needed breathing medication. Treat with levaquin/prednisone and albuterol inh PRN dyspnea/wheeze.  Check CXR today - ?  haziness RLL with chronic bronchitic changes. Update if not improving with treatment.  Pt agrees with plan.       Relevant Medications   predniSONE (DELTASONE) 20 MG tablet   albuterol (PROVENTIL HFA;VENTOLIN HFA) 108 (90 Base) MCG/ACT inhaler   Other Relevant Orders   DG Chest 2 View   COPD with emphysema ([place])    Consider spirometry once breathing back to baseline.       Relevant Medications   predniSONE (DELTASONE) 20 MG tablet   albuterol (PROVENTIL HFA;VENTOLIN HFA) 108 (90 Base) MCG/ACT inhaler       Follow up plan: No Follow-up on file.  [name], MD

## 2015-10-18 NOTE — Assessment & Plan Note (Signed)
Consider spirometry once breathing back to baseline.

## 2015-10-21 MED FILL — GENVOYA TABLET: 150-150-200 | 30 days supply | Qty: 30 | Fill #8

## 2015-11-18 MED FILL — GENVOYA TABLET: 150-150-200 | 30 days supply | Qty: 30 | Fill #9

## 2015-11-22 ENCOUNTER — Other Ambulatory Visit: Payer: Medicare Other

## 2015-11-22 DIAGNOSIS — B20 Human immunodeficiency virus [HIV] disease: Secondary | ICD-10-CM | POA: Diagnosis not present

## 2015-11-22 DIAGNOSIS — Z79899 Other long term (current) drug therapy: Secondary | ICD-10-CM

## 2015-11-22 LAB — COMPREHENSIVE METABOLIC PANEL
ALK PHOS: 70 U/L (ref 40–115)
ALT: 10 U/L (ref 9–46)
AST: 13 U/L (ref 10–35)
Albumin: 3.5 g/dL — ABNORMAL LOW (ref 3.6–5.1)
BUN: 11 mg/dL (ref 7–25)
CALCIUM: 9.1 mg/dL (ref 8.6–10.3)
CHLORIDE: 101 mmol/L (ref 98–110)
CO2: 29 mmol/L (ref 20–31)
Creat: 1.15 mg/dL (ref 0.70–1.18)
Glucose, Bld: 93 mg/dL (ref 65–99)
POTASSIUM: 3.3 mmol/L — AB (ref 3.5–5.3)
Sodium: 142 mmol/L (ref 135–146)
TOTAL PROTEIN: 7.1 g/dL (ref 6.1–8.1)
Total Bilirubin: 0.4 mg/dL (ref 0.2–1.2)

## 2015-11-22 LAB — CBC
HEMATOCRIT: 41.1 % (ref 38.5–50.0)
Hemoglobin: 13.3 g/dL (ref 13.2–17.1)
MCH: 27.3 pg (ref 27.0–33.0)
MCHC: 32.4 g/dL (ref 32.0–36.0)
MCV: 84.2 fL (ref 80.0–100.0)
MPV: 9.7 fL (ref 7.5–12.5)
Platelets: 281 10*3/uL (ref 140–400)
RBC: 4.88 MIL/uL (ref 4.20–5.80)
RDW: 15.2 % — ABNORMAL HIGH (ref 11.0–15.0)
WBC: 6.9 10*3/uL (ref 3.8–10.8)

## 2015-11-22 LAB — T-HELPER CELL (CD4) - (RCID CLINIC ONLY)
CD4 % Helper T Cell: 36 % (ref 33–55)
CD4 T CELL ABS: 760 /uL (ref 400–2700)

## 2015-11-22 LAB — LIPID PANEL
CHOL/HDL RATIO: 3 ratio (ref <=5.0)
CHOLESTEROL: 141 mg/dL (ref 125–200)
HDL: 47 mg/dL (ref >=40)
LDL Cholesterol: 70 mg/dL (ref <130)
TRIGLYCERIDES: 122 mg/dL (ref <150)
VLDL: 24 mg/dL (ref <30)

## 2015-11-23 LAB — RPR: RPR Ser Ql: REACTIVE — AB

## 2015-11-23 LAB — RPR TITER

## 2015-11-25 LAB — FLUORESCENT TREPONEMAL AB(FTA)-IGG-BLD: FLUORESCENT TREPONEMAL ABS: REACTIVE — AB

## 2015-11-26 LAB — HIV-1 RNA QUANT-NO REFLEX-BLD

## 2015-12-10 ENCOUNTER — Encounter: Payer: Self-pay | Admitting: Internal Medicine

## 2015-12-10 ENCOUNTER — Ambulatory Visit (INDEPENDENT_AMBULATORY_CARE_PROVIDER_SITE_OTHER): Payer: Medicare Other | Admitting: Internal Medicine

## 2015-12-10 DIAGNOSIS — B20 Human immunodeficiency virus [HIV] disease: Secondary | ICD-10-CM

## 2015-12-10 DIAGNOSIS — I10 Essential (primary) hypertension: Secondary | ICD-10-CM

## 2015-12-10 NOTE — Assessment & Plan Note (Addendum)
Patient's HIV remains under excellent control.  He is tolerating Genvoya well without side effects.  Reports taking it with breakfast and denies any missed doses in the past 4 weeks.  Condoms offered/refused.  Will continue on current regimen.  Follow-up in 12 months with labs prior.   Addendum: I have seen and examined Charles Daniel today and discussed his care with Shawn Route NP. I agree with his assessment and plans. Charles Daniel continues to do very well and has excellent adherence with Genvoya. His infection is under excellent long-term control. He will continue Genvoya follow-up in one year.

## 2015-12-10 NOTE — Progress Notes (Signed)
   Subjective:    Patient ID: Charles Daniel, male    DOB: 1941-04-08, 75 y.o.   MRN: [id number]  HPI  Charles Daniel is a 75 year old male who was last seen 05/30/15 for history of HIV.  His ART regimen consists of Genvoya, which he reports taking every morning with breakfast.  Patient denies any missed doses in the past 4 weeks.  Additionally, he denies any side effects from his medication or issues receiving his medications from pharmacy.  History of HTN, which is within acceptable range today.  Patient seen in clinic for routine follow-up without chief complaints.  Lab Results  Component Value Date   CD4TABS 760 11/22/2015   CD4TABS 990 05/16/2015   CD4TABS 1,000 11/12/2014   Lab Results  Component Value Date   HIV1RNAQUANT <20 11/22/2015    Review of Systems  Constitutional: Negative for chills and fever.  HENT: Negative for mouth sores, sore throat and trouble swallowing.   Respiratory: Negative for cough and shortness of breath.   Cardiovascular: Negative for chest pain.  Gastrointestinal: Negative for abdominal pain, constipation, diarrhea, nausea and vomiting.  Genitourinary: Negative for discharge and dysuria.  Skin: Negative for rash.  Neurological: Negative for light-headedness and headaches.      Objective:   Physical Exam  Constitutional: He is oriented to person, place, and time. He appears well-developed.  HENT:  Mouth/Throat: Oropharynx is clear and moist. No oropharyngeal exudate.  Eyes: Pupils are equal, round, and reactive to light.  Cardiovascular: Normal rate, regular rhythm and normal heart sounds.   No murmur heard. Pulmonary/Chest: Effort normal and breath sounds normal. No respiratory distress. He has no wheezes.  Abdominal: Soft. Bowel sounds are normal. He exhibits no distension. There is no tenderness.  Lymphadenopathy:    He has no cervical adenopathy.  Neurological: He is alert and oriented to person, place, and time.  Psychiatric: He has a  normal mood and affect. His behavior is normal. Judgment and thought content normal.      Assessment & Plan:  Human immunodeficiency virus (HIV) disease Patient's HIV remains under excellent control.  He is tolerating Genvoya well without side effects.  Reports taking it with breakfast and denies any missed doses in the past 4 weeks.  Condoms offered/refused.  Will continue on current regimen.  Follow-up in 12 months with labs prior.  Essential hypertension Patient's BP within acceptable range for age.  He remains on Amlodipine and HCTZ.  Episodic hypokalemia.  Educated patient on dietary sources with high levels of potassium.  Patient being followed by PCP.  Addendum: I have seen and examined Charles Daniel today and discussed his care with Shawn Route NP. I agree with his assessment and plans. [name] continues to do very well and has excellent adherence with Genvoya. His infection is under excellent long-term control. He will continue Genvoya follow-up in one year.  Michel Bickers, MD [facility] [phone] pager   [phone] cell 12/10/2015, 2:40 PM

## 2015-12-10 NOTE — Assessment & Plan Note (Signed)
Patient's BP within acceptable range for age.  He remains on Amlodipine and HCTZ.  Episodic hypokalemia.  Educated patient on dietary sources with high levels of potassium.  Patient being followed by PCP.

## 2015-12-16 DIAGNOSIS — C61 Malignant neoplasm of prostate: Secondary | ICD-10-CM | POA: Diagnosis not present

## 2015-12-16 MED FILL — GENVOYA TABLET: 150-150-200 | 30 days supply | Qty: 30 | Fill #10

## 2015-12-22 ENCOUNTER — Other Ambulatory Visit: Payer: Self-pay | Admitting: Family Medicine

## 2015-12-22 DIAGNOSIS — I10 Essential (primary) hypertension: Secondary | ICD-10-CM

## 2015-12-23 DIAGNOSIS — Z8546 Personal history of malignant neoplasm of prostate: Secondary | ICD-10-CM | POA: Diagnosis not present

## 2015-12-26 ENCOUNTER — Ambulatory Visit: Payer: Medicare Other | Admitting: Family Medicine

## 2016-01-07 ENCOUNTER — Ambulatory Visit (INDEPENDENT_AMBULATORY_CARE_PROVIDER_SITE_OTHER): Payer: Medicare Other | Admitting: Family Medicine

## 2016-01-07 ENCOUNTER — Encounter: Payer: Self-pay | Admitting: Family Medicine

## 2016-01-07 VITALS — BP 130/76 | HR 79 | Temp 98.3°F | Wt 139.5 lb

## 2016-01-07 DIAGNOSIS — I10 Essential (primary) hypertension: Secondary | ICD-10-CM | POA: Diagnosis not present

## 2016-01-07 DIAGNOSIS — B20 Human immunodeficiency virus [HIV] disease: Secondary | ICD-10-CM

## 2016-01-07 DIAGNOSIS — Z23 Encounter for immunization: Secondary | ICD-10-CM

## 2016-01-07 MED ORDER — HYDROCHLOROTHIAZIDE 50 MG PO TABS: 50.0000 mg | ORAL_TABLET | Freq: Every day | ORAL | 3 refills | Status: DC

## 2016-01-07 MED ORDER — POTASSIUM CHLORIDE ER 10 MEQ PO TBCR: 10.0000 meq | EXTENDED_RELEASE_TABLET | Freq: Every day | ORAL | 3 refills | Status: DC

## 2016-01-07 NOTE — Progress Notes (Signed)
Pre visit review using our clinic review tool, if applicable. No additional management support is needed unless otherwise documented below in the visit note. 

## 2016-01-07 NOTE — Progress Notes (Signed)
BP 130/76   Pulse 79   Temp 98.3 F (36.8 C) (Oral)   Wt 139 lb 8 oz (63.3 kg)   SpO2 94%   BMI 22.52 kg/m    CC: 6 mo f/u visit Subjective:    Patient ID: Charles Daniel, male    DOB: [birth year], 75 y.o.   MRN: [id number]  HPI: Charles Daniel is a 75 y.o. male presenting on 01/07/2016 for Follow-up (6 months )   HIV - followed by ID clinic Megan Salon). Last CD4 count 760.   HTN - Compliant with current antihypertensive regimen of amlodipine 5mg  daily + hctz 25mg  daily. Does not check blood pressures at home. No low blood pressure readings or symptoms of dizziness/syncope. Denies HA, vision changes, CP/tightness, SOB, leg swelling.    Relevant past medical, surgical, family and social history reviewed and updated as indicated. Interim medical history since our last visit reviewed. Allergies and medications reviewed and updated. Current Outpatient Prescriptions on File Prior to Visit  Medication Sig  . elvitegravir-cobicistat-emtricitabine-tenofovir (GENVOYA) 150-150-200-10 MG TABS tablet Take 1 tablet by mouth daily with breakfast.   No current facility-administered medications on file prior to visit.     Review of Systems Per HPI unless specifically indicated in ROS section     Objective:    BP 130/76   Pulse 79   Temp 98.3 F (36.8 C) (Oral)   Wt 139 lb 8 oz (63.3 kg)   SpO2 94%   BMI 22.52 kg/m   Wt Readings from Last 3 Encounters:  01/07/16 139 lb 8 oz (63.3 kg)  12/10/15 140 lb 6.4 oz (63.7 kg)  10/18/15 138 lb (62.6 kg)    Physical Exam  Constitutional: He appears well-developed and well-nourished. No distress.  HENT:  Mouth/Throat: Oropharynx is clear and moist. No oropharyngeal exudate.  Cardiovascular: Normal rate, regular rhythm, normal heart sounds and intact distal pulses.   No murmur heard. Pulmonary/Chest: Effort normal and breath sounds normal. No respiratory distress. He has no wheezes. He has no rales.  Musculoskeletal: He exhibits no edema.    Skin: Skin is warm and dry. No rash noted.  Psychiatric: He has a normal mood and affect.  Nursing note and vitals reviewed.  Results for orders placed or performed in visit on 11/22/15  T-helper cell (CD4)- ([facility] clinic only)  Result Value Ref Range   CD4 T Cell Abs 760 400 - 2,700 /uL   CD4 % Helper T Cell 36 33 - 55 %  HIV 1 RNA quant-no reflex-bld  Result Value Ref Range   HIV 1 RNA Quant <20 <20 copies/mL   HIV1 RNA Quant, Log <1.30 <1.30 Log copies/mL  CBC  Result Value Ref Range   WBC 6.9 3.8 - 10.8 K/uL   RBC 4.88 4.20 - 5.80 MIL/uL   Hemoglobin 13.3 13.2 - 17.1 g/dL   HCT 41.1 38.5 - 50.0 %   MCV 84.2 80.0 - 100.0 fL   MCH 27.3 27.0 - 33.0 pg   MCHC 32.4 32.0 - 36.0 g/dL   RDW 15.2 (H) 11.0 - 15.0 %   Platelets 281 140 - 400 K/uL   MPV 9.7 7.5 - 12.5 fL  Comprehensive metabolic panel  Result Value Ref Range   Sodium 142 135 - 146 mmol/L   Potassium 3.3 (L) 3.5 - 5.3 mmol/L   Chloride 101 98 - 110 mmol/L   CO2 29 20 - 31 mmol/L   Glucose, Bld 93 65 - 99 mg/dL   BUN 11 7 -  25 mg/dL   Creat 1.15 0.70 - 1.18 mg/dL   Total Bilirubin 0.4 0.2 - 1.2 mg/dL   Alkaline Phosphatase 70 40 - 115 U/L   AST 13 10 - 35 U/L   ALT 10 9 - 46 U/L   Total Protein 7.1 6.1 - 8.1 g/dL   Albumin 3.5 (L) 3.6 - 5.1 g/dL   Calcium 9.1 8.6 - 10.3 mg/dL  Lipid panel  Result Value Ref Range   Cholesterol 141 125 - 200 mg/dL   Triglycerides 122 <150 mg/dL   HDL 47 >=40 mg/dL   Total CHOL/HDL Ratio 3.0 <=5.0 Ratio   VLDL 24 <30 mg/dL   LDL Cholesterol 70 <130 mg/dL  RPR  Result Value Ref Range   RPR Ser Ql REACTIVE (A) NON REAC  Fluorescent treponemal ab(fta)-IgG-bld  Result Value Ref Range   Fluorescent Treponemal ABS REACTIVE (A)   Rpr titer  Result Value Ref Range   RPR Titer 1:1       Assessment & Plan:   Problem List Items Addressed This Visit    Essential hypertension - Primary    Chronic, stable. Pt has been taking hctz 50mg  daily although he should have been taking  hctz 25mg  and amlodipine 5mg  daily. Given good control on current regimen (hctz 50mg  daily), will continue this regimen.  For ongoing hypokalemia - will start Kdur 59mEq once daily. Pt agrees with plan. I also encouraged increased potassium in the diet.      Relevant Medications   hydrochlorothiazide (HYDRODIURIL) 50 MG tablet   Human immunodeficiency virus (HIV) disease ([place])    Other Visit Diagnoses    Need for influenza vaccination       Relevant Orders   Flu Vaccine QUAD 36+ mos PF IM (Fluarix & Fluzone Quad PF) (Completed)       Follow up plan: Return in about 6 months (around 07/06/2016) for medicare wellness visit.  [name], MD

## 2016-01-07 NOTE — Assessment & Plan Note (Addendum)
Chronic, stable. Pt has been taking hctz 50mg  daily although he should have been taking hctz 25mg  and amlodipine 5mg  daily. Given good control on current regimen (hctz 50mg  daily), will continue this regimen.  For ongoing hypokalemia - will start Kdur 48mEq once daily. Pt agrees with plan. I also encouraged increased potassium in the diet.

## 2016-01-07 NOTE — Patient Instructions (Addendum)
Flu shot today Blood pressure is good. Continue hydrochlorothiazide 50mg  daily. Add potassium (K-dur) 50mEq once daily. Return in 6 months for medicare wellness visit Good to see you today, call us with questions.

## 2016-01-13 MED FILL — GENVOYA TABLET: 150-150-200 | 30 days supply | Qty: 30 | Fill #11

## 2016-01-22 ENCOUNTER — Ambulatory Visit (INDEPENDENT_AMBULATORY_CARE_PROVIDER_SITE_OTHER): Payer: Medicare Other | Admitting: Family Medicine

## 2016-01-22 DIAGNOSIS — Z23 Encounter for immunization: Secondary | ICD-10-CM

## 2016-01-22 NOTE — Progress Notes (Signed)
Ordered by Katha Cabal per her standing order protocol.

## 2016-02-10 ENCOUNTER — Other Ambulatory Visit: Payer: Self-pay | Admitting: Internal Medicine

## 2016-02-10 DIAGNOSIS — B20 Human immunodeficiency virus [HIV] disease: Secondary | ICD-10-CM

## 2016-02-10 MED FILL — GENVOYA TABLET: 150-150-200 | 30 days supply | Qty: 30 | Fill #0

## 2016-03-05 MED FILL — GENVOYA TABLET: 150-150-200 | 30 days supply | Qty: 30 | Fill #1

## 2016-03-19 ENCOUNTER — Ambulatory Visit: Payer: Medicare Other | Admitting: Family Medicine

## 2016-03-19 ENCOUNTER — Emergency Department (HOSPITAL_COMMUNITY): Payer: Medicare Other

## 2016-03-19 ENCOUNTER — Inpatient Hospital Stay (HOSPITAL_COMMUNITY)
Admission: EM | Admit: 2016-03-19 | Discharge: 2016-03-22 | DRG: 190 | Disposition: A | Discharge status: Home / Self Care | Payer: Medicare Other | Attending: Internal Medicine | Admitting: Internal Medicine

## 2016-03-19 ENCOUNTER — Ambulatory Visit (INDEPENDENT_AMBULATORY_CARE_PROVIDER_SITE_OTHER): Payer: Medicare Other | Admitting: Family Medicine

## 2016-03-19 ENCOUNTER — Encounter: Payer: Self-pay | Admitting: Family Medicine

## 2016-03-19 ENCOUNTER — Encounter (HOSPITAL_COMMUNITY): Payer: Self-pay | Admitting: Emergency Medicine

## 2016-03-19 VITALS — BP 150/94 | HR 106 | Temp 99.4°F | Wt 138.5 lb

## 2016-03-19 DIAGNOSIS — E860 Dehydration: Secondary | ICD-10-CM | POA: Diagnosis present

## 2016-03-19 DIAGNOSIS — R05 Cough: Secondary | ICD-10-CM | POA: Diagnosis not present

## 2016-03-19 DIAGNOSIS — Z8673 Personal history of transient ischemic attack (TIA), and cerebral infarction without residual deficits: Secondary | ICD-10-CM

## 2016-03-19 DIAGNOSIS — J209 Acute bronchitis, unspecified: Secondary | ICD-10-CM

## 2016-03-19 DIAGNOSIS — Z85038 Personal history of other malignant neoplasm of large intestine: Secondary | ICD-10-CM

## 2016-03-19 DIAGNOSIS — Z8611 Personal history of tuberculosis: Secondary | ICD-10-CM

## 2016-03-19 DIAGNOSIS — Z87891 Personal history of nicotine dependence: Secondary | ICD-10-CM

## 2016-03-19 DIAGNOSIS — R0689 Other abnormalities of breathing: Secondary | ICD-10-CM

## 2016-03-19 DIAGNOSIS — J069 Acute upper respiratory infection, unspecified: Secondary | ICD-10-CM | POA: Diagnosis present

## 2016-03-19 DIAGNOSIS — R000 Tachycardia, unspecified: Secondary | ICD-10-CM | POA: Diagnosis present

## 2016-03-19 DIAGNOSIS — E876 Hypokalemia: Secondary | ICD-10-CM | POA: Diagnosis not present

## 2016-03-19 DIAGNOSIS — R6510 Systemic inflammatory response syndrome (SIRS) of non-infectious origin without acute organ dysfunction: Secondary | ICD-10-CM | POA: Diagnosis not present

## 2016-03-19 DIAGNOSIS — R0603 Acute respiratory distress: Secondary | ICD-10-CM | POA: Diagnosis present

## 2016-03-19 DIAGNOSIS — J441 Chronic obstructive pulmonary disease with (acute) exacerbation: Principal | ICD-10-CM | POA: Diagnosis present

## 2016-03-19 DIAGNOSIS — J440 Chronic obstructive pulmonary disease with acute lower respiratory infection: Secondary | ICD-10-CM | POA: Diagnosis present

## 2016-03-19 DIAGNOSIS — Z79899 Other long term (current) drug therapy: Secondary | ICD-10-CM

## 2016-03-19 DIAGNOSIS — R0602 Shortness of breath: Secondary | ICD-10-CM | POA: Diagnosis not present

## 2016-03-19 DIAGNOSIS — R0902 Hypoxemia: Secondary | ICD-10-CM | POA: Diagnosis present

## 2016-03-19 DIAGNOSIS — B20 Human immunodeficiency virus [HIV] disease: Secondary | ICD-10-CM | POA: Diagnosis not present

## 2016-03-19 DIAGNOSIS — R059 Cough, unspecified: Secondary | ICD-10-CM

## 2016-03-19 DIAGNOSIS — Z21 Asymptomatic human immunodeficiency virus [HIV] infection status: Secondary | ICD-10-CM | POA: Diagnosis present

## 2016-03-19 DIAGNOSIS — E872 Acidosis: Secondary | ICD-10-CM | POA: Diagnosis not present

## 2016-03-19 DIAGNOSIS — Z8546 Personal history of malignant neoplasm of prostate: Secondary | ICD-10-CM

## 2016-03-19 DIAGNOSIS — I10 Essential (primary) hypertension: Secondary | ICD-10-CM | POA: Diagnosis present

## 2016-03-19 DIAGNOSIS — Z8042 Family history of malignant neoplasm of prostate: Secondary | ICD-10-CM

## 2016-03-19 DIAGNOSIS — R069 Unspecified abnormalities of breathing: Secondary | ICD-10-CM | POA: Diagnosis not present

## 2016-03-19 DIAGNOSIS — Z803 Family history of malignant neoplasm of breast: Secondary | ICD-10-CM

## 2016-03-19 LAB — MRSA PCR SCREENING: MRSA BY PCR: NEGATIVE

## 2016-03-19 LAB — CBC WITH DIFFERENTIAL/PLATELET
BASOS ABS: 0 10*3/uL (ref 0.0–0.1)
Basophils Relative: 0 %
Eosinophils Absolute: 0 10*3/uL (ref 0.0–0.7)
Eosinophils Relative: 0 %
HEMATOCRIT: 40 % (ref 39.0–52.0)
HEMOGLOBIN: 13.3 g/dL (ref 13.0–17.0)
LYMPHS PCT: 11 %
Lymphs Abs: 1 10*3/uL (ref 0.7–4.0)
MCH: 27.8 pg (ref 26.0–34.0)
MCHC: 33.3 g/dL (ref 30.0–36.0)
MCV: 83.7 fL (ref 78.0–100.0)
MONO ABS: 0.5 10*3/uL (ref 0.1–1.0)
Monocytes Relative: 6 %
NEUTROS ABS: 7.9 10*3/uL — AB (ref 1.7–7.7)
NEUTROS PCT: 83 %
Platelets: 241 10*3/uL (ref 150–400)
RBC: 4.78 MIL/uL (ref 4.22–5.81)
RDW: 14.1 % (ref 11.5–15.5)
WBC: 9.5 10*3/uL (ref 4.0–10.5)

## 2016-03-19 LAB — COMPREHENSIVE METABOLIC PANEL
ALBUMIN: 3.8 g/dL (ref 3.5–5.0)
ALT: 13 U/L — ABNORMAL LOW (ref 17–63)
ANION GAP: 14 (ref 5–15)
AST: 32 U/L (ref 15–41)
Alkaline Phosphatase: 72 U/L (ref 38–126)
BILIRUBIN TOTAL: 1 mg/dL (ref 0.3–1.2)
BUN: 12 mg/dL (ref 6–20)
CO2: 25 mmol/L (ref 22–32)
Calcium: 8.8 mg/dL — ABNORMAL LOW (ref 8.9–10.3)
Chloride: 99 mmol/L — ABNORMAL LOW (ref 101–111)
Creatinine, Ser: 1 mg/dL (ref 0.61–1.24)
GFR calc Af Amer: >60 mL/min (ref >60)
GFR calc non Af Amer: >60 mL/min (ref >60)
GLUCOSE: 130 mg/dL — AB (ref 65–99)
POTASSIUM: 3.1 mmol/L — AB (ref 3.5–5.1)
SODIUM: 138 mmol/L (ref 135–145)
TOTAL PROTEIN: 7.8 g/dL (ref 6.5–8.1)

## 2016-03-19 LAB — URINALYSIS, ROUTINE W REFLEX MICROSCOPIC
BILIRUBIN URINE: NEGATIVE
Glucose, UA: NEGATIVE mg/dL
Hgb urine dipstick: NEGATIVE
Ketones, ur: 15 mg/dL — AB
Leukocytes, UA: NEGATIVE
NITRITE: NEGATIVE
PROTEIN: NEGATIVE mg/dL
SPECIFIC GRAVITY, URINE: 1.02 (ref 1.005–1.030)
pH: 6.5 (ref 5.0–8.0)

## 2016-03-19 LAB — LACTIC ACID, PLASMA
LACTIC ACID, VENOUS: 4 mmol/L — AB (ref 0.5–1.9)
LACTIC ACID, VENOUS: 5 mmol/L — AB (ref 0.5–1.9)

## 2016-03-19 LAB — I-STAT CG4 LACTIC ACID, ED
LACTIC ACID, VENOUS: 2.37 mmol/L — AB (ref 0.5–1.9)
Lactic Acid, Venous: 2.77 mmol/L (ref 0.5–1.9)

## 2016-03-19 LAB — INFLUENZA PANEL BY PCR (TYPE A & B)
INFLBPCR: NEGATIVE
Influenza A By PCR: NEGATIVE

## 2016-03-19 LAB — I-STAT TROPONIN, ED: Troponin i, poc: 0.01 ng/mL (ref 0.00–0.08)

## 2016-03-19 LAB — PROCALCITONIN: Procalcitonin: <0.1 ng/mL

## 2016-03-19 MED ORDER — ALBUTEROL SULFATE (2.5 MG/3ML) 0.083% IN NEBU
5.0000 mg | INHALATION_SOLUTION | Freq: Once | RESPIRATORY_TRACT | Status: AC
  Administered 2016-03-19: 5 mg via RESPIRATORY_TRACT
  Filled 2016-03-19: qty 6

## 2016-03-19 MED ORDER — IPRATROPIUM-ALBUTEROL 0.5-2.5 (3) MG/3ML IN SOLN: 3.0000 mL | Freq: Once | RESPIRATORY_TRACT | Status: DC

## 2016-03-19 MED ORDER — DEXTROSE 5 % IV SOLN
1.0000 g | INTRAVENOUS | Status: DC
  Administered 2016-03-19 – 2016-03-21 (×3): 1 g via INTRAVENOUS
  Filled 2016-03-19 (×4): qty 10

## 2016-03-19 MED ORDER — SODIUM CHLORIDE 0.9 % IV BOLUS (SEPSIS)
500.0000 mL | Freq: Once | INTRAVENOUS | Status: AC
  Administered 2016-03-19: 500 mL via INTRAVENOUS

## 2016-03-19 MED ORDER — ENOXAPARIN SODIUM 40 MG/0.4ML ~~LOC~~ SOLN
40.0000 mg | SUBCUTANEOUS | Status: DC
  Administered 2016-03-19 – 2016-03-21 (×3): 40 mg via SUBCUTANEOUS
  Filled 2016-03-19 (×3): qty 0.4

## 2016-03-19 MED ORDER — ELVITEG-COBIC-EMTRICIT-TENOFAF 150-150-200-10 MG PO TABS
1.0000 | ORAL_TABLET | Freq: Every day | ORAL | Status: DC
  Administered 2016-03-20 – 2016-03-22 (×3): 1 via ORAL
  Filled 2016-03-19 (×3): qty 1

## 2016-03-19 MED ORDER — SODIUM CHLORIDE 0.9 % IV BOLUS (SEPSIS)
1000.0000 mL | Freq: Once | INTRAVENOUS | Status: AC
  Administered 2016-03-19: 1000 mL via INTRAVENOUS

## 2016-03-19 MED ORDER — SODIUM CHLORIDE 0.9% FLUSH
3.0000 mL | Freq: Two times a day (BID) | INTRAVENOUS | Status: DC
  Administered 2016-03-20 – 2016-03-22 (×4): 3 mL via INTRAVENOUS

## 2016-03-19 MED ORDER — DEXTROSE 5 % IV SOLN
500.0000 mg | INTRAVENOUS | Status: DC
  Administered 2016-03-19 – 2016-03-21 (×3): 500 mg via INTRAVENOUS
  Filled 2016-03-19 (×4): qty 500

## 2016-03-19 MED ORDER — POTASSIUM CHLORIDE CRYS ER 20 MEQ PO TBCR
40.0000 meq | EXTENDED_RELEASE_TABLET | Freq: Once | ORAL | Status: AC
  Administered 2016-03-19: 40 meq via ORAL
  Filled 2016-03-19: qty 2

## 2016-03-19 MED ORDER — SODIUM CHLORIDE 0.9 % IV SOLN
INTRAVENOUS | Status: DC
  Administered 2016-03-19 – 2016-03-20 (×2): via INTRAVENOUS

## 2016-03-19 MED ORDER — IPRATROPIUM-ALBUTEROL 0.5-2.5 (3) MG/3ML IN SOLN
3.0000 mL | Freq: Four times a day (QID) | RESPIRATORY_TRACT | Status: DC
  Administered 2016-03-20 (×4): 3 mL via RESPIRATORY_TRACT
  Filled 2016-03-19 (×4): qty 3

## 2016-03-19 MED ORDER — ALBUTEROL SULFATE (2.5 MG/3ML) 0.083% IN NEBU
2.5000 mg | INHALATION_SOLUTION | Freq: Once | RESPIRATORY_TRACT | Status: AC
  Administered 2016-03-19: 2.5 mg via RESPIRATORY_TRACT

## 2016-03-19 MED ORDER — IPRATROPIUM-ALBUTEROL 0.5-2.5 (3) MG/3ML IN SOLN
3.0000 mL | Freq: Four times a day (QID) | RESPIRATORY_TRACT | Status: DC | PRN
  Administered 2016-03-20 (×2): 3 mL via RESPIRATORY_TRACT
  Filled 2016-03-19 (×2): qty 3

## 2016-03-19 MED ORDER — DOXYCYCLINE HYCLATE 100 MG IV SOLR
100.0000 mg | Freq: Once | INTRAVENOUS | Status: AC
  Administered 2016-03-19: 100 mg via INTRAVENOUS
  Filled 2016-03-19: qty 100

## 2016-03-19 MED ORDER — SODIUM CHLORIDE 0.9 % IV BOLUS (SEPSIS): 1000.0000 mL | Freq: Once | INTRAVENOUS | Status: DC

## 2016-03-19 MED ORDER — GUAIFENESIN ER 600 MG PO TB12
600.0000 mg | ORAL_TABLET | Freq: Two times a day (BID) | ORAL | Status: DC
  Administered 2016-03-19 – 2016-03-22 (×6): 600 mg via ORAL
  Filled 2016-03-19 (×6): qty 1

## 2016-03-19 MED ORDER — IPRATROPIUM-ALBUTEROL 0.5-2.5 (3) MG/3ML IN SOLN
3.0000 mL | Freq: Four times a day (QID) | RESPIRATORY_TRACT | Status: DC
  Administered 2016-03-19: 3 mL via RESPIRATORY_TRACT
  Filled 2016-03-19: qty 3

## 2016-03-19 NOTE — Progress Notes (Signed)
Pre visit review using our clinic review tool, if applicable. No additional management support is needed unless otherwise documented below in the visit note. 

## 2016-03-19 NOTE — ED Notes (Signed)
Bed: [id number] Expected date:  Expected time:  Means of arrival:  Comments: 75 yo wheezing, pneumonia

## 2016-03-19 NOTE — H&P (Addendum)
History and Physical  Charles Daniel [id number] DOB: August 13, 1940 DOA: 03/19/2016  Referring physician: EDP PCP: Charles Bush, MD   Chief Complaint: sob, wheezing, cough  HPI: Charles Daniel is a 75 y.o. male   With h/o HIV (with undetectable viral load and CD4 32), remote h/o syphllis, TB report all treated, h/o prostate cancer s/p brachytherapy, h/o colon cancer s/p resection, h/o htn, cva with no residual weakness presented to the ED with above complaints, he reports that has these symptom about once a year , usually in the winter times, he was a past smoker, he report never been diagnosed with copd, he denies h/o asthma, he does not have a lung specialist. He works in the school system, does has sick contact, he report is uptodate with all his vaccines.  ED course: fever 101, initially with sinus tachycardia, bp stable, his cxr unremarkable, no leukocytosis, renal function wnl, k 3.1, lactic acid 2.37, troponin negative, he does has sinus tachycardia on ekg but no acute st/t changes. Clinically, he  has significant wheezing using accessory muscles, no hypoxia though, he received iv doxycycline, nebs, report feeling better, hospitalist called to admit the patient as copd exacerbation.   He denies history of heart disease, no edema.  Review of Systems:  Detail per HPI, Review of systems are otherwise negative  Past Medical History:  Diagnosis Date  . Bilateral hydrocele 2012  . Depression   . Diverticulosis 2013   by colonoscopy  . Emphysema lung (Charles Daniel) 03/2014    by CXR, remote smoking history  . History of colon cancer    2007 --  S/P RECTOSIGMOID COLECTOMY--  NO CHEMORADIATION--  NO RECURRENCE  . History of CVA (cerebrovascular accident)    2003-  RIGHT MIDDLE CVA---   NO RESIDUAL  . History of hepatitis B    REMOTE AND INACTIVE  PER DOCUMENTATION  . History of syphilis    SECONDARY SYPHILITIS TX'D IN 1998  PER DOCUMENTATION  . History of tuberculosis    LATENT TB  TX'D  X12  MONTHS IN 1995  . HIV infection ([place])    Charles Daniel (DR CAMPBELL)  . Hypertension   . Prostate carcinoma [facility]) dx 09/2012   T1c, brachytherapy/seed implant Charles Daniel, Charles Daniel)   Past Surgical History:  Procedure Laterality Date  . COLONOSCOPY  08/2011   3 polyps, diverticulosis, rec rpt 5 yrs Charles Daniel)  . INGUINAL HERNIA REPAIR  1994   UNILATERAL  . LOW ANTERIOR RESECTION RECTOSIGMOID COLON  03-09-2006  . PROSTATE BIOPSY  09/2012   53cc  (MD OFFICE)  . RADIOACTIVE SEED IMPLANT N/A 01/20/2013   Procedure: RADIOACTIVE SEED IMPLANT;  Surgeon: Charles Jabs, MD;  as well as Charles Kidney MD  . TRANSTHORACIC ECHOCARDIOGRAM  11-16-2001   LV WALL THICKNESS MODERATELY INCREASED/  EF 55-65%/ LVSF NORMAL   Social History:  reports that he quit smoking about 7 years ago. His smoking use included Cigarettes. He has a 10.50 pack-year smoking history. He has never used smokeless tobacco. He reports that he does not drink alcohol or use drugs. Patient lives at home & is able to participate in activities of daily living independently   No Known Allergies  Family History  Problem Relation Age of Onset  . Cancer Sister     breast  . Cancer Brother 38    prostate, treated with seed implant  . Cancer Brother     prostate  . Cancer Brother     prostate  .  Cancer Daughter     breast  . Cancer Other     prostate  . Cancer Father     unsure  . CAD Neg Hx   . Stroke Neg Hx   . Diabetes Neg Hx       Prior to Admission medications   Medication Sig Start Date End Date Taking? Authorizing Provider  GENVOYA 150-150-200-10 MG TABS tablet TAKE 1 TABLET BY MOUTH DAILY WITH BREAKFAST 02/10/16  Yes Charles Bickers, MD  hydrochlorothiazide (HYDRODIURIL) 50 MG tablet Take 1 tablet (50 mg total) by mouth daily. 01/07/16  Yes Charles Bush, MD  potassium chloride (K-DUR) 10 MEQ tablet Take 1 tablet (10 mEq total) by mouth daily. Patient not taking: Reported on 03/19/2016  01/07/16   Charles Bush, MD    Physical Exam: BP 131/62 (BP Location: Right Arm)   Pulse 91   Temp 101 F (38.3 C) (Rectal)   Resp 21   Ht 5\' 8"  (1.727 m)   Wt 59.9 kg (132 lb)   SpO2 92%   BMI 20.07 kg/m   General:  NAD, very pleasant gentlemen , does had audible wheezing but able to speak in full sentences  Eyes: PERRL ENT: dry oral mucosa Neck: supple, no JVD Cardiovascular: RRR Respiratory: diffuse bilateral wheezing Abdomen: soft/ND/ND, positive bowel sounds Skin: no rash Musculoskeletal:  No edema Psychiatric: calm/cooperative Neurologic: no focal findings            Labs on Admission:  Basic Metabolic Panel:  Recent Labs Lab 03/19/16 1441  NA 138  K 3.1*  CL 99*  CO2 25  GLUCOSE 130*  BUN 12  CREATININE 1.00  CALCIUM 8.8*   Liver Function Tests:  Recent Labs Lab 03/19/16 1441  AST 32  ALT 13*  ALKPHOS 72  BILITOT 1.0  PROT 7.8  ALBUMIN 3.8   No results for input(s): LIPASE, AMYLASE in the last 168 hours. No results for input(s): AMMONIA in the last 168 hours. CBC:  Recent Labs Lab 03/19/16 1441  WBC 9.5  NEUTROABS 7.9*  HGB 13.3  HCT 40.0  MCV 83.7  PLT 241   Cardiac Enzymes: No results for input(s): CKTOTAL, CKMB, CKMBINDEX, TROPONINI in the last 168 hours.  BNP (last 3 results) No results for input(s): BNP in the last 8760 hours.  ProBNP (last 3 results) No results for input(s): PROBNP in the last 8760 hours.  CBG: No results for input(s): GLUCAP in the last 168 hours.  Radiological Exams on Admission: Dg Chest 2 View  Result Date: 03/19/2016 CLINICAL DATA:  Cough, fever, shortness of breath and left chest pain for the past 3 days. EXAM: CHEST  2 VIEW COMPARISON:  10/18/2015. FINDINGS: Normal sized heart. Tortuous and partially calcified thoracic aorta. Clear lungs. Unremarkable bones. IMPRESSION: No acute abnormality. Electronically Signed   By: Claudie Revering M.D.   On: 03/19/2016 14:59    EKG: Independently reviewed.  Sinus tachycardia, no acute st/t changes  Assessment/Plan Present on Admission: **None**  Fever:  No leukocytosis, he dose has mild lactic acidosis, lactic acid 2.37 ua pending, blood culture in process, cxr unremarkable,flu swab/ respiratory virus pending,  Will get procalcitonin, will check urine legionella and strep pneumon antigen, start rocephin/zitrho empirically to cover CAP/bronchitis   Wheezing: copd exacerbation? No room air Abx, nebs, mucinex Advised patient to see a pulmonary for lung function testing  Lactic acidosis: likely from mild hypoxia, dehydration,  Treat copd, start hydration, trend lactic acid  H/o HTN: on htcz only at home ,  this is held since admission due to fever, dehydration   Hypokalemia, replacek, check mag  HIV with reported undetectable viral load, last cd4 count 760 in 11/2015, he is on genvoya. H/o latent TB treated with 35month therapy in 1995, h/o secondary syphillis, treated in 1998  patient does not want his HIV diagnosis disclosed to his family  H/o prostate cancer s/p radioactive seed implant in 2014  H/o colon cancer s/p resection in 2007   DVT prophylaxis: lovenox  Consultants: none  Code Status: full   Family Communication:  Patient , his wife and daughter at bedside  Disposition Plan: med tele observation status, anticipate d/c home in 1-2 days  Time spent: 53mins  Brien Lowe MD, PhD [facility] Pager [phone] If 7PM-7AM, please contact night-coverage at [email or web address], password [facility]

## 2016-03-19 NOTE — ED Provider Notes (Signed)
Charles Daniel Note   CSN: [id number] Arrival date & time: 03/19/16  1407     History   Chief Complaint Chief Complaint  Patient presents with  . Shortness of Breath    HPI Charles Daniel is a 75 y.o. male.  HPI  75 year old male with a history of HIV with undetectable viral load presents from his doctor's office by EMS with cough and fever. Has been having cough and congestion" chest rattling" over the last 3 days. Denies fevers. Was found to have a temperature of 101 by EMS. Given 1000 mg Tylenol by EMS. He has had 2 breathing treatments, one by PCP and 1 by EMS. He states his shortness of breath is now resolved. He was given 125 mg of Solu-Medrol IV by EMS. Patient's original sats were 90% at the doctor's office. Patient has had a little bit of left-sided chest pain when coughing over the last 1-2 days. Denies leg swelling or leg pain. Cough is productive of brown sputum, no blood.  Past Medical History:  Diagnosis Date  . Bilateral hydrocele 2012  . Depression   . Diverticulosis 2013   by colonoscopy  . Emphysema lung ([place]) 03/2014    by CXR, remote smoking history  . History of colon cancer    2007 --  S/P RECTOSIGMOID COLECTOMY--  NO CHEMORADIATION--  NO RECURRENCE  . History of CVA (cerebrovascular accident)    2003-  RIGHT MIDDLE CVA---   NO RESIDUAL  . History of hepatitis B    REMOTE AND INACTIVE  PER DOCUMENTATION  . History of syphilis    SECONDARY SYPHILITIS TX'D IN 1998  PER DOCUMENTATION  . History of tuberculosis    LATENT TB  TX'D X12  MONTHS IN 1995  . HIV infection ([place])    [place] (DR CAMPBELL)  . Hypertension   . Prostate carcinoma [facility]) dx 09/2012   T1c, brachytherapy/seed implant [name])    Patient Active Problem List   Diagnosis Date Noted  . COPD exacerbation ([place]) 10/18/2015  . 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 Advanced care planning/counseling discussion 06/26/2014  . 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 Prostate CA ([place]) 11/08/2012  . Medicare annual wellness visit, subsequent 08/02/2012  . 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 WEIGHT LOSS 12/18/2008  . CONSTIPATION 03/16/2008  . TUBERCULOSIS 05/15/2006  . Human immunodeficiency virus (HIV) disease ([place]) 05/15/2006  . SYPHILIS 05/15/2006  . 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  . Ex-smoker 05/15/2006  . Essential hypertension 05/15/2006  . CEREBROVASCULAR ACCIDENT 05/15/2006  . HEPATITIS B, HX OF 05/15/2006  . GASTROINTESTINAL HEMORRHAGE, HX OF 05/15/2006    Past Surgical History:  Procedure Laterality Date  . COLONOSCOPY  08/2011   3 polyps, diverticulosis, rec rpt 5 yrs [name])  . INGUINAL HERNIA REPAIR  1994   UNILATERAL  . LOW ANTERIOR RESECTION RECTOSIGMOID COLON  03-09-2006  . PROSTATE BIOPSY  09/2012   53cc  (MD OFFICE)  . RADIOACTIVE SEED IMPLANT N/A 01/20/2013   Procedure: RADIOACTIVE SEED IMPLANT;  Surgeon: Claybon Jabs, MD;  as well as Milana Kidney MD  . TRANSTHORACIC ECHOCARDIOGRAM  11-16-2001   LV WALL THICKNESS MODERATELY INCREASED/  EF 55-65%/ LVSF NORMAL       Home Medications    Prior to Admission medications   Medication Sig Start Date End Date Taking? Authorizing Daniel  GENVOYA 150-150-200-10 MG TABS tablet TAKE 1 TABLET BY MOUTH DAILY WITH  BREAKFAST 02/10/16  Yes Michel Bickers, MD  hydrochlorothiazide (HYDRODIURIL) 50 MG tablet Take 1 tablet (50 mg total) by mouth daily. 01/07/16  Yes [name], MD  potassium chloride (K-DUR) 10 MEQ tablet Take 1 tablet (10 mEq total) by mouth daily. Patient not taking: Reported on 03/19/2016 01/07/16   [name], MD    Family History Family History  Problem Relation Age of Onset  . Cancer Sister     breast  . Cancer Brother 62    prostate, treated with seed implant  . Cancer Brother     prostate  . Cancer Brother     prostate  . Cancer Daughter      breast  . Cancer Other     prostate  . Cancer Father     unsure  . CAD Neg Hx   . Stroke Neg Hx   . Diabetes Neg Hx     Social History Social History  Substance Use Topics  . Smoking status: Former Smoker    Packs/day: 0.30    Years: 35.00    Types: Cigarettes    Quit date: 03/04/2009  . Smokeless tobacco: Never Used  . Alcohol use No     Allergies   Patient has no known allergies.   Review of Systems Review of Systems  Constitutional: Negative for fever.  HENT: Positive for congestion.   Respiratory: Positive for cough and shortness of breath.   Cardiovascular: Positive for chest pain. Negative for leg swelling.  All other systems reviewed and are negative.    Physical Exam Updated Vital Signs BP 134/84 (BP Location: Right Arm)   Pulse 93   Temp 97.7 F (36.5 C) (Oral)   Resp 21   Ht 5\' 8"  (1.727 m)   Wt 132 lb (59.9 kg)   SpO2 99%   BMI 20.07 kg/m   Physical Exam  Constitutional: He is oriented to person, place, and time. He appears well-developed and well-nourished. No distress.  HENT:  Head: Normocephalic and atraumatic.  Right Ear: External ear normal.  Left Ear: External ear normal.  Nose: Nose normal.  Eyes: Right eye exhibits no discharge. Left eye exhibits no discharge.  Neck: Neck supple.  Cardiovascular: Regular rhythm and normal heart sounds.  Tachycardia present.   Pulmonary/Chest: Effort normal. No accessory muscle usage. No respiratory distress. He has wheezes (mild, expiratory). He exhibits no tenderness.  Abdominal: Soft. There is no tenderness.  Musculoskeletal: He exhibits no edema.  Neurological: He is alert and oriented to person, place, and time.  Skin: Skin is warm and dry. He is not diaphoretic.  Nursing note and vitals reviewed.    ED Treatments / Results  Labs (all labs ordered are listed, but only abnormal results are displayed) Labs Reviewed  COMPREHENSIVE METABOLIC PANEL - Abnormal; Notable for the following:        Result Value   Potassium 3.1 (*)    Chloride 99 (*)    Glucose, Bld 130 (*)    Calcium 8.8 (*)    ALT 13 (*)    All other components within normal limits  CBC WITH DIFFERENTIAL/PLATELET - Abnormal; Notable for the following:    Neutro Abs 7.9 (*)    All other components within normal limits  I-STAT CG4 LACTIC ACID, ED - Abnormal; Notable for the following:    Lactic Acid, Venous 2.37 (*)    All other components within normal limits  CULTURE, BLOOD (ROUTINE X 2)  CULTURE, BLOOD (ROUTINE X 2)  URINE CULTURE  RESPIRATORY PANEL BY PCR  CULTURE, EXPECTORATED SPUTUM-ASSESSMENT  MRSA PCR SCREENING  URINALYSIS, ROUTINE W REFLEX MICROSCOPIC (NOT AT [facility])  INFLUENZA PANEL BY PCR (TYPE A & B, H1N1)  LEGIONELLA PNEUMOPHILA SEROGP 1 UR AG  STREP PNEUMONIAE URINARY ANTIGEN  PROCALCITONIN  I-STAT TROPOININ, ED  I-STAT CG4 LACTIC ACID, ED    EKG  EKG Interpretation  Date/Time:  Thursday March 19 2016 14:29:57 EST Ventricular Rate:  109 PR Interval:    QRS Duration: 94 QT Interval:  348 QTC Calculation: 469 R Axis:   89 Text Interpretation:  Sinus tachycardia Borderline right axis deviation rate is faster, otherwise similar to 2014 Confirmed by Rhyen Mazariego MD, [place] [phone]) on 03/19/2016 2:49:23 PM Also confirmed by Regenia Skeeter MD, [place] [phone]), editor [place], Leda Gauze [phone])  on 03/19/2016 3:27:51 PM       Radiology Dg Chest 2 View  Result Date: 03/19/2016 CLINICAL DATA:  Cough, fever, shortness of breath and left chest pain for the past 3 days. EXAM: CHEST  2 VIEW COMPARISON:  10/18/2015. FINDINGS: Normal sized heart. Tortuous and partially calcified thoracic aorta. Clear lungs. Unremarkable bones. IMPRESSION: No acute abnormality. Electronically Signed   By: Claudie Revering M.D.   On: 03/19/2016 14:59    Procedures Procedures (including critical care time)  Medications Ordered in ED Medications  doxycycline (VIBRAMYCIN) 100 mg in dextrose 5 % 250 mL IVPB (100 mg Intravenous New  Bag/Given 03/19/16 1651)  guaiFENesin (MUCINEX) 12 hr tablet 600 mg (not administered)  sodium chloride 0.9 % bolus 1,000 mL (0 mLs Intravenous Stopped 03/19/16 1641)  potassium chloride SA (K-DUR,KLOR-CON) CR tablet 40 mEq (40 mEq Oral Given 03/19/16 1607)  albuterol (PROVENTIL) (2.5 MG/3ML) 0.083% nebulizer solution 5 mg (5 mg Nebulization Given 03/19/16 1643)     Initial Impression / Assessment and Plan / ED Course  I have reviewed the triage vital signs and the nursing notes.  Pertinent labs & imaging results that were available during my care of the patient were reviewed by me and considered in my medical decision making (see chart for details).  Clinical Course as of Mar 19 1704  Thu Mar 19, 2016  1442 Patient currently denies dyspnea. He is tachycardic, unclear if this is from fever, albuterol, or sepsis. Labs, chest x-ray, ECG. He declines further breathing treatment at this time stating he feels fine from a respiratory standpoint. Continue to closely monitor.  [SG]  1639 Dr Erlinda Hong to admit, tele obs. Respiratory panel and flu ordered  [SG]    Clinical Course User Index [SG] Charles Gambler, MD    Patient feels improved but his sats are now trending down to the low 90s. He does have a little bit more increased work of breathing and shortness of breath. Will be given another DuoNeb. Patient also started on antibiotics for what is likely bronchitis with COPD with stress of breath, fever, and change of sputum. Lactate is mildly elevated. Blood pressure stable. Admit to telemetry observation. His CP is probably MSK from coughing, less likely ACS, PE or dissection  Final Clinical Impressions(s) / ED Diagnoses   Final diagnoses:  Acute bronchitis, unspecified organism    New Prescriptions New Prescriptions   No medications on file     Charles Gambler, MD 03/19/16 1707

## 2016-03-19 NOTE — Assessment & Plan Note (Addendum)
Vs PNA.  Either way, I think he needs ER eval and likely admission given respiratory status.  D/w pt.   Encouraged EMS transport, he declined.  Again offered, encouraged, declined by patient.  He wants to drive to ER.  This is AMA but I can't stop him and this is likely the only way he'll go to ER.  Will call ahead to [facility] ER.  App help of all involved.    Addendum- didn't call ER directly as patient then consented for EMS transport.   >25 minutes spent in face to face time with patient, >50% spent in counselling or coordination of care.  App help of all involved.

## 2016-03-19 NOTE — Progress Notes (Signed)
CRITICAL VALUE ALERT  Critical value received:  Lactic acid 5.0  Date of notification:  03/18/16  Time of notification:  2215  Critical value read back:Yes.    Nurse who received alert:  Linden Dolin   MD notified (1st page):  Schorr  Time of first page:  2218  MD notified (2nd page):  Time of second page:  Responding MD: Schorr  Time MD responded:  2225

## 2016-03-19 NOTE — ED Notes (Signed)
Patient given urinal, states unable to void at present time.

## 2016-03-19 NOTE — Progress Notes (Signed)
3 days of coughing, coughing up sputum.  Felt hot, no known fevers.  Sputum is brownish.  No vomiting.  No diarrhea.  Used SABA this AM.    H/o HIV on tx with last CD4 760 and undetectable viral load.    Meds, vitals, and allergies reviewed.   ROS: Per HPI unless specifically indicated in ROS section   Initially speaking in short sentences, with abd muscle use.  Initial pulse ox 90% MMM Neck supple Tachy but regular Lungs with diffuse dec in BS, diffuse rhonchi audible across the room, + accessory muscle use.    SABA neb given, patient rechecked.   Still with global dec in BS with rhonchi.  Pulse ox 94% in midst of SABA neb on RA.   Post neb, patient didn't feel sig difference.  D/w pt.

## 2016-03-19 NOTE — Patient Instructions (Signed)
Go straight to the Emergency Room at [facility].  Take care.  Glad to see you.

## 2016-03-19 NOTE — ED Triage Notes (Addendum)
Patient here from dr office with complaints of sob, cough x3 days. 90% on RA. Hx of COPD. 5 albuterol, .5 atro, 125mg  medrol, 1000 mg tylenol.

## 2016-03-19 NOTE — ED Notes (Signed)
MD Regenia Skeeter notified of Lactic Acid 2.37

## 2016-03-19 NOTE — Progress Notes (Signed)
CRITICAL VALUE ALERT  Critical value received:  Lactic acid  Date of notification: 03/19/2016  Time of notification:  1930  Critical value read back:yes  Nurse who received alert:  Wess Botts  MD notified (1st page):  Schoor  Time of first page:  1930  MD notified (2nd page):  Time of second page:  Responding MD:    Time MD responded:

## 2016-03-20 ENCOUNTER — Encounter (HOSPITAL_COMMUNITY): Payer: Self-pay

## 2016-03-20 DIAGNOSIS — Z8611 Personal history of tuberculosis: Secondary | ICD-10-CM | POA: Diagnosis not present

## 2016-03-20 DIAGNOSIS — B20 Human immunodeficiency virus [HIV] disease: Secondary | ICD-10-CM

## 2016-03-20 DIAGNOSIS — J441 Chronic obstructive pulmonary disease with (acute) exacerbation: Principal | ICD-10-CM

## 2016-03-20 DIAGNOSIS — E876 Hypokalemia: Secondary | ICD-10-CM | POA: Diagnosis not present

## 2016-03-20 DIAGNOSIS — J440 Chronic obstructive pulmonary disease with acute lower respiratory infection: Secondary | ICD-10-CM | POA: Diagnosis present

## 2016-03-20 DIAGNOSIS — Z8546 Personal history of malignant neoplasm of prostate: Secondary | ICD-10-CM | POA: Diagnosis not present

## 2016-03-20 DIAGNOSIS — R000 Tachycardia, unspecified: Secondary | ICD-10-CM | POA: Diagnosis present

## 2016-03-20 DIAGNOSIS — I10 Essential (primary) hypertension: Secondary | ICD-10-CM

## 2016-03-20 DIAGNOSIS — Z8673 Personal history of transient ischemic attack (TIA), and cerebral infarction without residual deficits: Secondary | ICD-10-CM | POA: Diagnosis not present

## 2016-03-20 DIAGNOSIS — Z87891 Personal history of nicotine dependence: Secondary | ICD-10-CM | POA: Diagnosis not present

## 2016-03-20 DIAGNOSIS — R0902 Hypoxemia: Secondary | ICD-10-CM | POA: Diagnosis present

## 2016-03-20 DIAGNOSIS — R6510 Systemic inflammatory response syndrome (SIRS) of non-infectious origin without acute organ dysfunction: Secondary | ICD-10-CM | POA: Diagnosis present

## 2016-03-20 DIAGNOSIS — Z803 Family history of malignant neoplasm of breast: Secondary | ICD-10-CM | POA: Diagnosis not present

## 2016-03-20 DIAGNOSIS — E860 Dehydration: Secondary | ICD-10-CM | POA: Diagnosis present

## 2016-03-20 DIAGNOSIS — R0602 Shortness of breath: Secondary | ICD-10-CM | POA: Diagnosis not present

## 2016-03-20 DIAGNOSIS — E872 Acidosis: Secondary | ICD-10-CM | POA: Diagnosis present

## 2016-03-20 DIAGNOSIS — Z85038 Personal history of other malignant neoplasm of large intestine: Secondary | ICD-10-CM | POA: Diagnosis not present

## 2016-03-20 DIAGNOSIS — Z79899 Other long term (current) drug therapy: Secondary | ICD-10-CM | POA: Diagnosis not present

## 2016-03-20 DIAGNOSIS — R0603 Acute respiratory distress: Secondary | ICD-10-CM | POA: Diagnosis present

## 2016-03-20 DIAGNOSIS — J069 Acute upper respiratory infection, unspecified: Secondary | ICD-10-CM | POA: Diagnosis present

## 2016-03-20 DIAGNOSIS — R05 Cough: Secondary | ICD-10-CM | POA: Diagnosis not present

## 2016-03-20 DIAGNOSIS — Z8042 Family history of malignant neoplasm of prostate: Secondary | ICD-10-CM | POA: Diagnosis not present

## 2016-03-20 LAB — RESPIRATORY PANEL BY PCR
ADENOVIRUS-RVPPCR: NOT DETECTED
Bordetella pertussis: NOT DETECTED
CHLAMYDOPHILA PNEUMONIAE-RVPPCR: NOT DETECTED
CORONAVIRUS HKU1-RVPPCR: NOT DETECTED
CORONAVIRUS NL63-RVPPCR: NOT DETECTED
Coronavirus 229E: NOT DETECTED
Coronavirus OC43: NOT DETECTED
Influenza A: NOT DETECTED
Influenza B: NOT DETECTED
MYCOPLASMA PNEUMONIAE-RVPPCR: NOT DETECTED
Metapneumovirus: NOT DETECTED
PARAINFLUENZA VIRUS 1-RVPPCR: NOT DETECTED
PARAINFLUENZA VIRUS 3-RVPPCR: NOT DETECTED
Parainfluenza Virus 2: NOT DETECTED
Parainfluenza Virus 4: NOT DETECTED
Respiratory Syncytial Virus: NOT DETECTED
Rhinovirus / Enterovirus: NOT DETECTED

## 2016-03-20 LAB — BASIC METABOLIC PANEL
ANION GAP: 7 (ref 5–15)
BUN: 14 mg/dL (ref 6–20)
CALCIUM: 8.2 mg/dL — AB (ref 8.9–10.3)
CHLORIDE: 108 mmol/L (ref 101–111)
CO2: 24 mmol/L (ref 22–32)
Creatinine, Ser: 1.05 mg/dL (ref 0.61–1.24)
GFR calc non Af Amer: >60 mL/min (ref >60)
Glucose, Bld: 130 mg/dL — ABNORMAL HIGH (ref 65–99)
POTASSIUM: 3.1 mmol/L — AB (ref 3.5–5.1)
Sodium: 139 mmol/L (ref 135–145)

## 2016-03-20 LAB — CBC
HEMATOCRIT: 37.5 % — AB (ref 39.0–52.0)
HEMOGLOBIN: 12.3 g/dL — AB (ref 13.0–17.0)
MCH: 27.6 pg (ref 26.0–34.0)
MCHC: 32.8 g/dL (ref 30.0–36.0)
MCV: 84.3 fL (ref 78.0–100.0)
Platelets: 218 10*3/uL (ref 150–400)
RBC: 4.45 MIL/uL (ref 4.22–5.81)
RDW: 14.4 % (ref 11.5–15.5)
WBC: 8.6 10*3/uL (ref 4.0–10.5)

## 2016-03-20 LAB — STREP PNEUMONIAE URINARY ANTIGEN: Strep Pneumo Urinary Antigen: NEGATIVE

## 2016-03-20 LAB — MAGNESIUM: Magnesium: 1.9 mg/dL (ref 1.7–2.4)

## 2016-03-20 LAB — EXPECTORATED SPUTUM ASSESSMENT W REFEX TO RESP CULTURE

## 2016-03-20 LAB — EXPECTORATED SPUTUM ASSESSMENT W GRAM STAIN, RFLX TO RESP C

## 2016-03-20 LAB — LACTIC ACID, PLASMA: LACTIC ACID, VENOUS: 1.4 mmol/L (ref 0.5–1.9)

## 2016-03-20 MED ORDER — POTASSIUM CHLORIDE CRYS ER 20 MEQ PO TBCR
40.0000 meq | EXTENDED_RELEASE_TABLET | ORAL | Status: AC
  Administered 2016-03-20 (×2): 40 meq via ORAL
  Filled 2016-03-20 (×2): qty 2

## 2016-03-20 MED ORDER — AZITHROMYCIN 250 MG PO TABS: 250.0000 mg | ORAL_TABLET | Freq: Every day | ORAL | 0 refills | Status: DC

## 2016-03-20 MED ORDER — ALBUTEROL SULFATE HFA 108 (90 BASE) MCG/ACT IN AERS: 2.0000 | INHALATION_SPRAY | Freq: Four times a day (QID) | RESPIRATORY_TRACT | 2 refills | Status: DC | PRN

## 2016-03-20 MED ORDER — IPRATROPIUM-ALBUTEROL 0.5-2.5 (3) MG/3ML IN SOLN
3.0000 mL | RESPIRATORY_TRACT | Status: DC
  Administered 2016-03-20 – 2016-03-22 (×10): 3 mL via RESPIRATORY_TRACT
  Filled 2016-03-20 (×10): qty 3

## 2016-03-20 MED ORDER — ALBUTEROL SULFATE (2.5 MG/3ML) 0.083% IN NEBU
2.5000 mg | INHALATION_SOLUTION | RESPIRATORY_TRACT | Status: DC | PRN
  Administered 2016-03-20: 2.5 mg via RESPIRATORY_TRACT
  Filled 2016-03-20: qty 3

## 2016-03-20 MED ORDER — PREDNISONE 20 MG PO TABS
60.0000 mg | ORAL_TABLET | Freq: Every day | ORAL | Status: DC
  Administered 2016-03-20: 60 mg via ORAL
  Filled 2016-03-20: qty 3

## 2016-03-20 MED ORDER — BUDESONIDE 0.25 MG/2ML IN SUSP
0.2500 mg | Freq: Two times a day (BID) | RESPIRATORY_TRACT | Status: DC
Start: 2016-03-20 — End: 2016-03-22
  Administered 2016-03-20 – 2016-03-22 (×4): 0.25 mg via RESPIRATORY_TRACT
  Filled 2016-03-20 (×4): qty 2

## 2016-03-20 MED ORDER — PREDNISONE 10 MG PO TABS: 60.0000 mg | ORAL_TABLET | Freq: Every day | ORAL | 0 refills | Status: DC

## 2016-03-20 MED ORDER — DOXYCYCLINE HYCLATE 50 MG PO CAPS: 100.0000 mg | ORAL_CAPSULE | Freq: Two times a day (BID) | ORAL | 0 refills | Status: DC

## 2016-03-20 NOTE — Progress Notes (Signed)
[facility]  Patient originally planned for discharge home today but he has become more short of breath and therefore will not be discharged.   Debbe Odea, MD

## 2016-03-20 NOTE — Progress Notes (Signed)
Rt gave pt his flutter valve . Pt knows and understands how to use.

## 2016-03-20 NOTE — Discharge Summary (Addendum)
Physician Discharge Summary  Charles Daniel [id number] DOB: 02-May-1941 DOA: 03/19/2016  PCP: [name], MD  Admit date: 03/19/2016 Discharge date: 03/22/2016  Admitted From: home  Disposition:  home   Recommendations for Outpatient Follow-up:  1. ? 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 Cough is not bad.  Has central chest pain when he coughs. He was able to sleep through the night. Appetite good.  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 He works in the school system, does has sick contact, he report is uptodate with all his vaccines.  ED course: fever 101, initially with sinus tachycardia, bp stable, his cxr unremarkable, no leukocytosis, renal function wnl, k 3.1, lactic acid 2.37, troponin negative, has sinus tachycardia on ekg but no acute st/t changes. Clinically, he  has significant wheezing using accessory muscles, no hypoxia though, he received iv doxycycline, nebs, report feeling better, hospitalist called to admit the patient as copd exacerbation. He was subsequently given Azithromycin and Ceftriaxone.   Hospital Course:  SIRs, Acute resp distress- ? COPD flare due to viral infection - improving.  Congested in upper airways today but no wheezing today. Pulse ox 93% on exertion.  - Fever resolved - CXR negative for infiltrates, Influenza and resp panel negative, pro calcitonin negative - will d/c home with Azithromycin total 5 days, 6 days Prednisone taper, Mucinex, Flutter valve, albuterol inhaler PRN and Advair   Lactic acidosis - LA of 5-  likely from mild hypoxia, dehydration - has normalized today   HTN - on HCTZ only at home- this is held due to fever, dehydration- recommended to continue to hold for 3 more days  Hypokalemia - replaced- Mg normal- needs to resume K replacement at home which he has not been taking  HIV  -with reported undetectable viral load, last cd4 count 760 in 11/2015, he is on genvoya. -H/o latent TB treated with 44month therapy in 1995, h/o secondary syphillis, treated in 1998  patient does not want his HIV diagnosis disclosed to his family  H/o prostate cancer s/p radioactive seed implant in 2014  H/o colon cancer s/p resection in 2007  Discharge Instructions  Discharge Instructions    Diet - low sodium heart healthy    Complete by:  As directed    Diet - low sodium heart healthy    Complete by:  As directed    Discharge instructions    Complete by:  As directed    Hold off on taking HCTZ for next 3 days then resume.   Discharge instructions    Complete by:  As directed    Drink 5-7 glasses of water a day   Increase activity slowly    Complete by:  As directed    Increase activity slowly    Complete by:  As directed        Medication List    STOP taking these medications   hydrochlorothiazide 50 MG tablet Commonly known as:  HYDRODIURIL     TAKE these medications   albuterol 108 (90 Base) MCG/ACT inhaler Commonly known as:  PROVENTIL HFA;VENTOLIN HFA Inhale 2 puffs into the lungs every 6 (six) hours as needed for wheezing or shortness of breath.   albuterol (2.5 MG/3ML) 0.083% nebulizer solution Commonly known as:   PROVENTIL Take 3 mLs (2.5 mg total) by nebulization every 4 (four) hours as needed for wheezing or shortness of breath.   azithromycin 250 MG tablet Commonly known as:  ZITHROMAX Take 1 tablet (250 mg total) by mouth daily.   Fluticasone-Salmeterol 250-50 MCG/DOSE Aepb Commonly known as:  ADVAIR DISKUS Inhale 1 puff into the lungs daily. Use every day for 1 wk.   GENVOYA 150-150-200-10 MG Tabs tablet Generic drug:  elvitegravir-cobicistat-emtricitabine-tenofovir TAKE 1 TABLET BY MOUTH DAILY WITH BREAKFAST   guaiFENesin 600 MG 12 hr tablet Commonly known as:  MUCINEX Take 1 tablet (600 mg total) by mouth 2 (two) times daily.   potassium chloride 10 MEQ tablet Commonly known as:  K-DUR Take 1 tablet (10 mEq total) by mouth daily.   predniSONE 10 MG tablet Commonly known as:  DELTASONE Take 6 tablets (60 mg total) by mouth daily with breakfast. Take 6 tabs tomorrow and then decrease by 1 tab daily until finished Notes to patient:  Take 6 pills on 03/21/16 Take 5 pills on 03/22/16 Take 4 pills on 03/23/16 Take 3 pills on 03/24/16 Take 2 pills on 03/25/16 Take 1 pill on 03/26/16      Follow-up Information    [name], MD Follow up.   Specialty:  Family Medicine Contact information: [place] [address] [phone]        Michel Bickers, MD .   Specialty:  Infectious Diseases Contact information: [address] [place] [address] [phone]          No Known Allergies   Procedures/Studies:   Dg Chest 2 View  Result Date: 03/19/2016 CLINICAL DATA:  Cough, fever, shortness of breath and left chest pain for the past 3 days. EXAM: CHEST  2 VIEW COMPARISON:  10/18/2015. FINDINGS: Normal sized heart. Tortuous and partially calcified thoracic aorta. Clear lungs. Unremarkable bones. IMPRESSION: No acute abnormality. Electronically Signed   By: Claudie Revering M.D.   On: 03/19/2016 14:59   Dg Chest Port 1  View  Result Date: 03/21/2016 CLINICAL DATA:  Productive cough. EXAM: PORTABLE CHEST 1 VIEW COMPARISON:  03/19/2016 FINDINGS: Heart and mediastinal contours are within normal limits. No focal opacities or effusions. No acute bony abnormality. IMPRESSION: No active disease. Electronically Signed   By: Rolm Baptise M.D.   On: 03/21/2016 09:02       Discharge Exam: Vitals:   03/22/16 0505 03/22/16 0542  BP: (!) 160/91 (!) 157/94  Pulse: 77 76  Resp: 20   Temp: 97.8 F (36.6 C)    Vitals:   03/22/16 0400 03/22/16 0505 03/22/16 0542 03/22/16 0734  BP:  (!) 160/91 (!) 157/94   Pulse:  77 76   Resp:  20    Temp:  97.8 F (36.6 C)    TempSrc:  Oral    SpO2: 96% 99%  98%  Weight:      Height:  General: Pt is alert, awake, not in acute distress Cardiovascular: RRR, S1/S2 +, no rubs, no gallops Respiratory: CTA bilaterally, no wheezing, no rhonchi Abdominal: Soft, NT, ND, bowel sounds + Extremities: no edema, no cyanosis    The results of significant diagnostics from this hospitalization (including imaging, microbiology, ancillary and laboratory) are listed below for reference.     Microbiology: Recent Results (from the past 240 hour(s))  Blood Culture (routine x 2)     Status: None (Preliminary result)   Collection Time: 03/19/16  2:32 PM  Result Value Ref Range Status   Specimen Description BLOOD LEFT ARM  Final   Special Requests BOTTLES DRAWN AEROBIC AND ANAEROBIC 5CC  Final   Culture   Final    NO GROWTH 2 DAYS Performed at [facility]    Report Status PENDING  Incomplete  Blood Culture (routine x 2)     Status: None (Preliminary result)   Collection Time: 03/19/16  2:42 PM  Result Value Ref Range Status   Specimen Description BLOOD RIGHT ARM  Final   Special Requests BOTTLES DRAWN AEROBIC AND ANAEROBIC 5CC  Final   Culture   Final    NO GROWTH 2 DAYS Performed at [facility]    Report Status PENDING  Incomplete  Respiratory Panel by PCR      Status: None   Collection Time: 03/19/16  6:26 PM  Result Value Ref Range Status   Adenovirus NOT DETECTED NOT DETECTED Final   Coronavirus 229E NOT DETECTED NOT DETECTED Final   Coronavirus HKU1 NOT DETECTED NOT DETECTED Final   Coronavirus NL63 NOT DETECTED NOT DETECTED Final   Coronavirus OC43 NOT DETECTED NOT DETECTED Final   Metapneumovirus NOT DETECTED NOT DETECTED Final   Rhinovirus / Enterovirus NOT DETECTED NOT DETECTED Final   Influenza A NOT DETECTED NOT DETECTED Final   Influenza B NOT DETECTED NOT DETECTED Final   Parainfluenza Virus 1 NOT DETECTED NOT DETECTED Final   Parainfluenza Virus 2 NOT DETECTED NOT DETECTED Final   Parainfluenza Virus 3 NOT DETECTED NOT DETECTED Final   Parainfluenza Virus 4 NOT DETECTED NOT DETECTED Final   Respiratory Syncytial Virus NOT DETECTED NOT DETECTED Final   Bordetella pertussis NOT DETECTED NOT DETECTED Final   Chlamydophila pneumoniae NOT DETECTED NOT DETECTED Final   Mycoplasma pneumoniae NOT DETECTED NOT DETECTED Final    Comment: Performed at [facility]  MRSA PCR Screening     Status: None   Collection Time: 03/19/16  6:26 PM  Result Value Ref Range Status   MRSA by PCR NEGATIVE NEGATIVE Final    Comment:        The GeneXpert MRSA Assay (FDA approved for NASAL specimens only), is one component of a comprehensive MRSA colonization surveillance program. It is not intended to diagnose MRSA infection nor to guide or monitor treatment for MRSA infections.   Urine culture     Status: None   Collection Time: 03/19/16  7:19 PM  Result Value Ref Range Status   Specimen Description URINE, RANDOM  Final   Special Requests NONE  Final   Culture NO GROWTH Performed at [facility]   Final   Report Status 03/21/2016 FINAL  Final  Culture, expectorated sputum-assessment     Status: None   Collection Time: 03/20/16 11:36 AM  Result Value Ref Range Status   Specimen Description SPUTUM  Final   Special Requests  NONE  Final   Sputum evaluation   Final  MICROSCOPIC FINDINGS SUGGEST THAT THIS SPECIMEN IS NOT REPRESENTATIVE OF LOWER RESPIRATORY SECRETIONS. PLEASE RECOLLECT. INFORMED HAULT, B. RN @1151  ON 11.17.17 BY NMCCOY    Report Status 03/20/2016 FINAL  Final  Culture, expectorated sputum-assessment     Status: None   Collection Time: 03/21/16  4:10 AM  Result Value Ref Range Status   Specimen Description SPUTUM  Final   Special Requests NONE  Final   Sputum evaluation   Final    MICROSCOPIC FINDINGS SUGGEST THAT THIS SPECIMEN IS NOT REPRESENTATIVE OF LOWER RESPIRATORY SECRETIONS. PLEASE RECOLLECT. RESULTS CALLED TO, READ BACK BY AND VERIFIED WITH MELISSA Vickki Muff [id number] @ [id number] BY J SCOTTON    Report Status 03/21/2016 FINAL  Final  Culture, expectorated sputum-assessment     Status: None   Collection Time: 03/21/16 11:45 AM  Result Value Ref Range Status   Specimen Description SPUTUM  Final   Special Requests Normal  Final   Sputum evaluation   Final    THIS SPECIMEN IS ACCEPTABLE. RESPIRATORY CULTURE REPORT TO FOLLOW.   Report Status 03/21/2016 FINAL  Final  Culture, respiratory (NON-Expectorated)     Status: None (Preliminary result)   Collection Time: 03/21/16 11:45 AM  Result Value Ref Range Status   Specimen Description SPUTUM  Final   Special Requests NONE  Final   Gram Stain   Final    MODERATE WBC PRESENT,BOTH PMN AND MONONUCLEAR MODERATE SQUAMOUS EPITHELIAL CELLS PRESENT FEW YEAST FEW GRAM POSITIVE RODS RARE GRAM POSITIVE COCCI    Culture   Final    TOO YOUNG TO READ Performed at [facility]    Report Status PENDING  Incomplete     Labs: BNP (last 3 results) No results for input(s): BNP in the last 8760 hours. Basic Metabolic Panel:  Recent Labs Lab 03/19/16 1441 03/20/16 0339 03/21/16 0335  NA 138 139 138  K 3.1* 3.1* 3.4*  CL 99* 108 103  CO2 25 24 24   GLUCOSE 130* 130* 122*  BUN 12 14 11   CREATININE 1.00 1.05 0.89  CALCIUM 8.8* 8.2* 8.3*   MG  --  1.9  --    Liver Function Tests:  Recent Labs Lab 03/19/16 1441  AST 32  ALT 13*  ALKPHOS 72  BILITOT 1.0  PROT 7.8  ALBUMIN 3.8   No results for input(s): LIPASE, AMYLASE in the last 168 hours. No results for input(s): AMMONIA in the last 168 hours. CBC:  Recent Labs Lab 03/19/16 1441 03/20/16 0339  WBC 9.5 8.6  NEUTROABS 7.9*  --   HGB 13.3 12.3*  HCT 40.0 37.5*  MCV 83.7 84.3  PLT 241 218   Cardiac Enzymes: No results for input(s): CKTOTAL, CKMB, CKMBINDEX, TROPONINI in the last 168 hours. BNP: Invalid input(s): POCBNP CBG: No results for input(s): GLUCAP in the last 168 hours. D-Dimer No results for input(s): DDIMER in the last 72 hours. Hgb A1c No results for input(s): HGBA1C in the last 72 hours. Lipid Profile No results for input(s): CHOL, HDL, LDLCALC, TRIG, CHOLHDL, LDLDIRECT in the last 72 hours. Thyroid function studies No results for input(s): TSH, T4TOTAL, T3FREE, THYROIDAB in the last 72 hours.  Invalid input(s): FREET3 Anemia work up No results for input(s): VITAMINB12, FOLATE, FERRITIN, TIBC, IRON, RETICCTPCT in the last 72 hours. Urinalysis    Component Value Date/Time   COLORURINE YELLOW 03/19/2016 1919   APPEARANCEUR CLEAR 03/19/2016 1919   LABSPEC 1.020 03/19/2016 1919   PHURINE 6.5 03/19/2016 [place] NEGATIVE 03/19/2016 1919  [place] NEGATIVE 03/19/2016 [place] NEGATIVE 03/19/2016 1919   BILIRUBINUR Negative 12/21/2013 1748   KETONESUR 15 (A) 03/19/2016 1919   PROTEINUR NEGATIVE 03/19/2016 1919   UROBILINOGEN 0.2 12/21/2013 1748   NITRITE NEGATIVE 03/19/2016 1919   LEUKOCYTESUR NEGATIVE 03/19/2016 1919   Sepsis Labs Invalid input(s): PROCALCITONIN,  WBC,  LACTICIDVEN Microbiology Recent Results (from the past 240 hour(s))  Blood Culture (routine x 2)     Status: None (Preliminary result)   Collection Time: 03/19/16  2:32 PM  Result Value Ref Range Status   Specimen Description BLOOD LEFT ARM  Final    Special Requests BOTTLES DRAWN AEROBIC AND ANAEROBIC 5CC  Final   Culture   Final    NO GROWTH 2 DAYS Performed at [facility]    Report Status PENDING  Incomplete  Blood Culture (routine x 2)     Status: None (Preliminary result)   Collection Time: 03/19/16  2:42 PM  Result Value Ref Range Status   Specimen Description BLOOD RIGHT ARM  Final   Special Requests BOTTLES DRAWN AEROBIC AND ANAEROBIC 5CC  Final   Culture   Final    NO GROWTH 2 DAYS Performed at [facility]    Report Status PENDING  Incomplete  Respiratory Panel by PCR     Status: None   Collection Time: 03/19/16  6:26 PM  Result Value Ref Range Status   Adenovirus NOT DETECTED NOT DETECTED Final   Coronavirus 229E NOT DETECTED NOT DETECTED Final   Coronavirus HKU1 NOT DETECTED NOT DETECTED Final   Coronavirus NL63 NOT DETECTED NOT DETECTED Final   Coronavirus OC43 NOT DETECTED NOT DETECTED Final   Metapneumovirus NOT DETECTED NOT DETECTED Final   Rhinovirus / Enterovirus NOT DETECTED NOT DETECTED Final   Influenza A NOT DETECTED NOT DETECTED Final   Influenza B NOT DETECTED NOT DETECTED Final   Parainfluenza Virus 1 NOT DETECTED NOT DETECTED Final   Parainfluenza Virus 2 NOT DETECTED NOT DETECTED Final   Parainfluenza Virus 3 NOT DETECTED NOT DETECTED Final   Parainfluenza Virus 4 NOT DETECTED NOT DETECTED Final   Respiratory Syncytial Virus NOT DETECTED NOT DETECTED Final   Bordetella pertussis NOT DETECTED NOT DETECTED Final   Chlamydophila pneumoniae NOT DETECTED NOT DETECTED Final   Mycoplasma pneumoniae NOT DETECTED NOT DETECTED Final    Comment: Performed at [facility]  MRSA PCR Screening     Status: None   Collection Time: 03/19/16  6:26 PM  Result Value Ref Range Status   MRSA by PCR NEGATIVE NEGATIVE Final    Comment:        The GeneXpert MRSA Assay (FDA approved for NASAL specimens only), is one component of a comprehensive MRSA colonization surveillance program. It is  not intended to diagnose MRSA infection nor to guide or monitor treatment for MRSA infections.   Urine culture     Status: None   Collection Time: 03/19/16  7:19 PM  Result Value Ref Range Status   Specimen Description URINE, RANDOM  Final   Special Requests NONE  Final   Culture NO GROWTH Performed at [facility]   Final   Report Status 03/21/2016 FINAL  Final  Culture, expectorated sputum-assessment     Status: None   Collection Time: 03/20/16 11:36 AM  Result Value Ref Range Status   Specimen Description SPUTUM  Final   Special Requests NONE  Final   Sputum evaluation   Final    MICROSCOPIC FINDINGS SUGGEST THAT  THIS SPECIMEN IS NOT REPRESENTATIVE OF LOWER RESPIRATORY SECRETIONS. PLEASE RECOLLECT. INFORMED HAULT, B. RN @1151  ON 11.17.17 BY NMCCOY    Report Status 03/20/2016 FINAL  Final  Culture, expectorated sputum-assessment     Status: None   Collection Time: 03/21/16  4:10 AM  Result Value Ref Range Status   Specimen Description SPUTUM  Final   Special Requests NONE  Final   Sputum evaluation   Final    MICROSCOPIC FINDINGS SUGGEST THAT THIS SPECIMEN IS NOT REPRESENTATIVE OF LOWER RESPIRATORY SECRETIONS. PLEASE RECOLLECT. RESULTS CALLED TO, READ BACK BY AND VERIFIED WITH MELISSA Vickki Muff [id number] @ [id number] BY J SCOTTON    Report Status 03/21/2016 FINAL  Final  Culture, expectorated sputum-assessment     Status: None   Collection Time: 03/21/16 11:45 AM  Result Value Ref Range Status   Specimen Description SPUTUM  Final   Special Requests Normal  Final   Sputum evaluation   Final    THIS SPECIMEN IS ACCEPTABLE. RESPIRATORY CULTURE REPORT TO FOLLOW.   Report Status 03/21/2016 FINAL  Final  Culture, respiratory (NON-Expectorated)     Status: None (Preliminary result)   Collection Time: 03/21/16 11:45 AM  Result Value Ref Range Status   Specimen Description SPUTUM  Final   Special Requests NONE  Final   Gram Stain   Final    MODERATE WBC PRESENT,BOTH PMN AND  MONONUCLEAR MODERATE SQUAMOUS EPITHELIAL CELLS PRESENT FEW YEAST FEW GRAM POSITIVE RODS RARE GRAM POSITIVE COCCI    Culture   Final    TOO YOUNG TO READ Performed at [facility]    Report Status PENDING  Incomplete     Time coordinating discharge: Over 30 minutes  SIGNED:   Debbe Odea, MD  [facility] 03/22/2016, 11:17 AM Pager   If 7PM-7AM, please contact night-coverage [email or web address] Password [email or web address]

## 2016-03-20 NOTE — Progress Notes (Signed)
Rt called to room 1403 to give neb tx. RT had to give pt and prn tx as well. Pt having lots of wheezing with sob. After two neb tx given pt feeling some relief.

## 2016-03-20 NOTE — Care Management Obs Status (Signed)
[place] NOTIFICATION   Patient Details  Name: Charles Daniel MRN: [id number] Date of Birth: 01-Jun-1940   Medicare Observation Status Notification Given:  Yes    Lynnell Catalan, RN 03/20/2016, 9:49 AM

## 2016-03-21 ENCOUNTER — Inpatient Hospital Stay (HOSPITAL_COMMUNITY): Payer: Medicare Other

## 2016-03-21 LAB — LEGIONELLA PNEUMOPHILA SEROGP 1 UR AG: L. pneumophila Serogp 1 Ur Ag: NEGATIVE

## 2016-03-21 LAB — URINE CULTURE: Culture: NO GROWTH

## 2016-03-21 LAB — EXPECTORATED SPUTUM ASSESSMENT W GRAM STAIN, RFLX TO RESP C: Special Requests: NORMAL

## 2016-03-21 LAB — EXPECTORATED SPUTUM ASSESSMENT W REFEX TO RESP CULTURE

## 2016-03-21 LAB — BASIC METABOLIC PANEL
ANION GAP: 11 (ref 5–15)
BUN: 11 mg/dL (ref 6–20)
CHLORIDE: 103 mmol/L (ref 101–111)
CO2: 24 mmol/L (ref 22–32)
Calcium: 8.3 mg/dL — ABNORMAL LOW (ref 8.9–10.3)
Creatinine, Ser: 0.89 mg/dL (ref 0.61–1.24)
GFR calc non Af Amer: >60 mL/min (ref >60)
Glucose, Bld: 122 mg/dL — ABNORMAL HIGH (ref 65–99)
POTASSIUM: 3.4 mmol/L — AB (ref 3.5–5.1)
SODIUM: 138 mmol/L (ref 135–145)

## 2016-03-21 LAB — PROCALCITONIN: Procalcitonin: <0.1 ng/mL

## 2016-03-21 MED ORDER — METHYLPREDNISOLONE SODIUM SUCC 125 MG IJ SOLR
60.0000 mg | Freq: Four times a day (QID) | INTRAMUSCULAR | Status: DC
  Administered 2016-03-21 – 2016-03-22 (×5): 60 mg via INTRAVENOUS
  Filled 2016-03-21 (×5): qty 2

## 2016-03-21 MED ORDER — METHYLPREDNISOLONE SODIUM SUCC 125 MG IJ SOLR
80.0000 mg | Freq: Once | INTRAMUSCULAR | Status: AC
  Administered 2016-03-21: 80 mg via INTRAVENOUS
  Filled 2016-03-21: qty 2

## 2016-03-21 MED ORDER — POTASSIUM CHLORIDE CRYS ER 20 MEQ PO TBCR
40.0000 meq | EXTENDED_RELEASE_TABLET | ORAL | Status: AC
  Administered 2016-03-21 (×2): 40 meq via ORAL
  Filled 2016-03-21 (×2): qty 2

## 2016-03-21 MED ORDER — LEVALBUTEROL HCL 1.25 MG/0.5ML IN NEBU
1.2500 mg | INHALATION_SOLUTION | Freq: Four times a day (QID) | RESPIRATORY_TRACT | Status: DC | PRN
  Administered 2016-03-21: 1.25 mg via RESPIRATORY_TRACT
  Filled 2016-03-21 (×2): qty 0.5

## 2016-03-21 NOTE — Progress Notes (Signed)
Patient stated that he had an increase in shortness of breath and wheezing. Notified MD on call and STAT orders were put in. See MAR. O2 at this time was 94% on Room Air. RT was also notified. Will continue to monitor patient closely.

## 2016-03-21 NOTE — Progress Notes (Addendum)
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 He works in the school system, does has sick contact, he report is uptodate with all his vaccines.  Subjective: Coughing up yellow mucous. Per RN, he became short of breath last night and required and extra Neb treatment. He states he is quite short of breath when he ambulates.   Assessment & Plan:  SIRs, Acute resp distress-  COPD flare due to viral infection - wheezing today Pulse ox 93% on exertion but quite congested and short of breath when moving around.   - Fever resolved - CXR x 2 negative for infiltrates, Influenza and resp panel negative, pro calcitonin negative - cont  Azithromycin, Solumedrol, Duo- nebs, Pulmocort, Mucinex, Flutter valve     Lactic acidosis - LA of 5-  likely from mild hypoxia, dehydration - has normalized    HTN - on HCTZ only at home- this is held due to fever, dehydration-    Hypokalemia - replaced- Mg normal- needs to resume K replacement at home which he has not been taking  HIV  -with reported undetectable viral load, last cd4 count 760 in 11/2015, he is on genvoya. -H/o latent TB treated with 80month therapy in 1995, h/o secondary syphillis, treated in 1998  patient does not want his HIV diagnosis disclosed to his family  H/o prostate cancer s/p radioactive seed implant in 2014  H/o colon cancer s/p resection in 2007   DVT prophylaxis: Lovenox Code Status:  Full code Family Communication:  Disposition Plan: home when stable Consultants:    Procedures:    Antimicrobials:  Anti-infectives    Start     Dose/Rate Route Frequency Ordered Stop   03/20/16 0800  elvitegravir-cobicistat-emtricitabine-tenofovir (GENVOYA) 150-150-200-10 MG tablet 1 tablet     1 tablet Oral Daily with breakfast 03/19/16 1818     03/20/16 0000  doxycycline (VIBRAMYCIN) 50 MG capsule  Status:  Discontinued     100 mg Oral 2 times daily 03/20/16 1243 03/20/16    03/20/16 0000  azithromycin (ZITHROMAX) 250 MG tablet  Status:  Discontinued     250 mg Oral Daily 03/20/16 1254 03/20/16    03/20/16 0000  azithromycin (ZITHROMAX) 250 MG tablet     250 mg Oral Daily 03/20/16 1312     03/19/16 2000  cefTRIAXone (ROCEPHIN) 1 g in dextrose 5 % 50 mL IVPB     1 g 100 mL/hr over 30 Minutes Intravenous Every 24 hours 03/19/16 1738     03/19/16 2000  azithromycin (ZITHROMAX) 500 mg in dextrose 5 % 250 mL IVPB     500 mg 250 mL/hr over 60 Minutes Intravenous Every 24 hours 03/19/16 1738     03/19/16 1630  doxycycline (VIBRAMYCIN) 100 mg in dextrose 5 % 250 mL IVPB     100 mg 125 mL/hr over 120 Minutes Intravenous  Once 03/19/16 1623 03/19/16 1851       Objective: Vitals:   03/20/16 2005 03/20/16  2100 03/21/16 0406 03/21/16 0753  BP:  (!) 179/84 (!) 159/87   Pulse:  98 99   Resp:  (!) 22 20   Temp:  98.8 F (37.1 C) 97.8 F (36.6 C)   TempSrc:  Oral Oral   SpO2: 96% 98% 95% 98%  Weight:      Height:        Intake/Output Summary (Last 24 hours) at 03/21/16 0910 Last data filed at 03/21/16 0600  Gross per 24 hour  Intake             1180 ml  Output              200 ml  Net              980 ml   Filed Weights   03/19/16 1447 03/19/16 1818  Weight: 59.9 kg (132 lb) 63 kg (139 lb)    Examination: General exam: thin man sitting up in bed, mildly uncomfortable HEENT: PERRLA, oral mucosa moist, no sclera icterus or thrush Respiratory system: marked chest  congestion with rhonchi, mild wheeze and poor air entry. tachypneic in 20s at rest but able to complete sentences Cardiovascular system: S1 & S2 heard, RRR.  No murmurs  Gastrointestinal system: Abdomen soft, non-tender, nondistended. Normal bowel sound. No organomegaly Central nervous system: Alert and oriented. No focal neurological deficits. Extremities: No cyanosis, clubbing or edema Skin: No rashes or ulcers Psychiatry:  Mood & affect appropriate.     Data Reviewed: I have personally reviewed following labs and imaging studies  CBC:  Recent Labs Lab 03/19/16 1441 03/20/16 0339  WBC 9.5 8.6  NEUTROABS 7.9*  --   HGB 13.3 12.3*  HCT 40.0 37.5*  MCV 83.7 84.3  PLT 241 [id number]   Basic Metabolic Panel:  Recent Labs Lab 03/19/16 1441 03/20/16 0339  NA 138 139  K 3.1* 3.1*  CL 99* 108  CO2 25 24  GLUCOSE 130* 130*  BUN 12 14  CREATININE 1.00 1.05  CALCIUM 8.8* 8.2*  MG  --  1.9   GFR: Estimated Creatinine Clearance: 54.3 mL/min (by C-G formula based on SCr of 1.05 mg/dL). Liver Function Tests:  Recent Labs Lab 03/19/16 1441  AST 32  ALT 13*  ALKPHOS 72  BILITOT 1.0  PROT 7.8  ALBUMIN 3.8   No results for input(s): LIPASE, AMYLASE in the last 168 hours. No results for input(s): AMMONIA in the last 168 hours. Coagulation Profile: No results for input(s): INR, PROTIME in the last 168 hours. Cardiac Enzymes: No results for input(s): CKTOTAL, CKMB, CKMBINDEX, TROPONINI in the last 168 hours. BNP (last 3 results) No results for input(s): PROBNP in the last 8760 hours. HbA1C: No results for input(s): HGBA1C in the last 72 hours. CBG: No results for input(s): GLUCAP in the last 168 hours. Lipid Profile: No results for input(s): CHOL, HDL, LDLCALC, TRIG, CHOLHDL, LDLDIRECT in the last 72 hours. Thyroid Function Tests: No results for input(s): TSH, T4TOTAL, FREET4, T3FREE, THYROIDAB in the last 72 hours. Anemia Panel: No results for input(s): VITAMINB12,  FOLATE, FERRITIN, TIBC, IRON, RETICCTPCT in the last 72 hours. Urine analysis:    Component Value Date/Time   COLORURINE YELLOW 03/19/2016 1919   APPEARANCEUR CLEAR 03/19/2016 1919   LABSPEC 1.020 03/19/2016 1919   PHURINE 6.5 03/19/2016 1919   GLUCOSEU NEGATIVE 03/19/2016 [place] NEGATIVE 03/19/2016 [place] NEGATIVE 03/19/2016 1919   BILIRUBINUR Negative 12/21/2013 1748   KETONESUR 15 (A) 03/19/2016 1919  PROTEINUR NEGATIVE 03/19/2016 1919   UROBILINOGEN 0.2 12/21/2013 1748   NITRITE NEGATIVE 03/19/2016 1919   LEUKOCYTESUR NEGATIVE 03/19/2016 1919   Sepsis Labs: @LABRCNTIP (procalcitonin:4,lacticidven:4) ) Recent Results (from the past 240 hour(s))  Blood Culture (routine x 2)     Status: None (Preliminary result)   Collection Time: 03/19/16  2:32 PM  Result Value Ref Range Status   Specimen Description BLOOD LEFT ARM  Final   Special Requests BOTTLES DRAWN AEROBIC AND ANAEROBIC 5CC  Final   Culture   Final    NO GROWTH < 24 HOURS Performed at [facility]    Report Status PENDING  Incomplete  Blood Culture (routine x 2)     Status: None (Preliminary result)   Collection Time: 03/19/16  2:42 PM  Result Value Ref Range Status   Specimen Description BLOOD RIGHT ARM  Final   Special Requests BOTTLES DRAWN AEROBIC AND ANAEROBIC 5CC  Final   Culture   Final    NO GROWTH < 24 HOURS Performed at [facility]    Report Status PENDING  Incomplete  Respiratory Panel by PCR     Status: None   Collection Time: 03/19/16  6:26 PM  Result Value Ref Range Status   Adenovirus NOT DETECTED NOT DETECTED Final   Coronavirus 229E NOT DETECTED NOT DETECTED Final   Coronavirus HKU1 NOT DETECTED NOT DETECTED Final   Coronavirus NL63 NOT DETECTED NOT DETECTED Final   Coronavirus OC43 NOT DETECTED NOT DETECTED Final   Metapneumovirus NOT DETECTED NOT DETECTED Final   Rhinovirus / Enterovirus NOT DETECTED NOT DETECTED Final   Influenza A NOT DETECTED NOT  DETECTED Final   Influenza B NOT DETECTED NOT DETECTED Final   Parainfluenza Virus 1 NOT DETECTED NOT DETECTED Final   Parainfluenza Virus 2 NOT DETECTED NOT DETECTED Final   Parainfluenza Virus 3 NOT DETECTED NOT DETECTED Final   Parainfluenza Virus 4 NOT DETECTED NOT DETECTED Final   Respiratory Syncytial Virus NOT DETECTED NOT DETECTED Final   Bordetella pertussis NOT DETECTED NOT DETECTED Final   Chlamydophila pneumoniae NOT DETECTED NOT DETECTED Final   Mycoplasma pneumoniae NOT DETECTED NOT DETECTED Final    Comment: Performed at [facility]  MRSA PCR Screening     Status: None   Collection Time: 03/19/16  6:26 PM  Result Value Ref Range Status   MRSA by PCR NEGATIVE NEGATIVE Final    Comment:        The GeneXpert MRSA Assay (FDA approved for NASAL specimens only), is one component of a comprehensive MRSA colonization surveillance program. It is not intended to diagnose MRSA infection nor to guide or monitor treatment for MRSA infections.   Culture, expectorated sputum-assessment     Status: None   Collection Time: 03/20/16 11:36 AM  Result Value Ref Range Status   Specimen Description SPUTUM  Final   Special Requests NONE  Final   Sputum evaluation   Final    MICROSCOPIC FINDINGS SUGGEST THAT THIS SPECIMEN IS NOT REPRESENTATIVE OF LOWER RESPIRATORY SECRETIONS. PLEASE RECOLLECT. INFORMED HAULT, B. RN @1151  ON 11.17.17 BY NMCCOY    Report Status 03/20/2016 FINAL  Final  Culture, expectorated sputum-assessment     Status: None   Collection Time: 03/21/16  4:10 AM  Result Value Ref Range Status   Specimen Description SPUTUM  Final   Special Requests NONE  Final   Sputum evaluation   Final    MICROSCOPIC FINDINGS SUGGEST THAT THIS SPECIMEN IS NOT REPRESENTATIVE OF LOWER RESPIRATORY  SECRETIONS. PLEASE RECOLLECT. RESULTS CALLED TO, READ BACK BY AND VERIFIED WITH MELISSA Vickki Muff [id number] @ [id number] BY J SCOTTON    Report Status 03/21/2016 FINAL  Final          Radiology Studies: Dg Chest 2 View  Result Date: 03/19/2016 CLINICAL DATA:  Cough, fever, shortness of breath and left chest pain for the past 3 days. EXAM: CHEST  2 VIEW COMPARISON:  10/18/2015. FINDINGS: Normal sized heart. Tortuous and partially calcified thoracic aorta. Clear lungs. Unremarkable bones. IMPRESSION: No acute abnormality. Electronically Signed   By: Claudie Revering M.D.   On: 03/19/2016 14:59   Dg Chest Port 1 View  Result Date: 03/21/2016 CLINICAL DATA:  Productive cough. EXAM: PORTABLE CHEST 1 VIEW COMPARISON:  03/19/2016 FINDINGS: Heart and mediastinal contours are within normal limits. No focal opacities or effusions. No acute bony abnormality. IMPRESSION: No active disease. Electronically Signed   By: Rolm Baptise M.D.   On: 03/21/2016 09:02      Scheduled Meds: . azithromycin  500 mg Intravenous Q24H  . budesonide (PULMICORT) nebulizer solution  0.25 mg Nebulization BID  . cefTRIAXone (ROCEPHIN)  IV  1 g Intravenous Q24H  . elvitegravir-cobicistat-emtricitabine-tenofovir  1 tablet Oral Q breakfast  . enoxaparin (LOVENOX) injection  40 mg Subcutaneous Q24H  . guaiFENesin  600 mg Oral BID  . ipratropium-albuterol  3 mL Nebulization Once  . ipratropium-albuterol  3 mL Nebulization Q4H WA  . methylPREDNISolone (SOLU-MEDROL) injection  60 mg Intravenous Q6H  . sodium chloride  1,000 mL Intravenous Once  . sodium chloride flush  3 mL Intravenous Q12H   Continuous Infusions:   LOS: 1 day    Time spent in minutes: 44    Ritha Sampedro, MD [facility] Pager: [email or web address] Password [email or web address] 03/21/2016, 9:10 AM

## 2016-03-22 MED ORDER — FLUTICASONE-SALMETEROL 250-50 MCG/DOSE IN AEPB: 1.0000 | INHALATION_SPRAY | Freq: Every day | RESPIRATORY_TRACT | 0 refills | Status: DC

## 2016-03-22 MED ORDER — ALBUTEROL SULFATE HFA 108 (90 BASE) MCG/ACT IN AERS: 2.0000 | INHALATION_SPRAY | Freq: Four times a day (QID) | RESPIRATORY_TRACT | 2 refills | Status: DC | PRN

## 2016-03-22 MED ORDER — GUAIFENESIN ER 600 MG PO TB12: 600.0000 mg | ORAL_TABLET | Freq: Two times a day (BID) | ORAL | 0 refills | Status: DC

## 2016-03-22 MED ORDER — AZITHROMYCIN 250 MG PO TABS: 250.0000 mg | ORAL_TABLET | Freq: Every day | ORAL | 0 refills | Status: DC

## 2016-03-22 MED ORDER — ALBUTEROL SULFATE (2.5 MG/3ML) 0.083% IN NEBU: 2.5000 mg | INHALATION_SOLUTION | RESPIRATORY_TRACT | 12 refills | Status: DC | PRN

## 2016-03-22 NOTE — Care Management Note (Signed)
Case Management Note  Patient Details  Name: Charles Daniel MRN: [id number] Date of Birth: 1940-10-14  Subjective/Objective:    COPD exacerbation                Action/Plan: Discharge Planning: AVS reviewed:  Chart reviewed. No NCM needs identified.   PCP [name]  Expected Discharge Date:  03/22/2016             Expected Discharge Plan:  Home/Self Care  In-House Referral:  NA  Discharge planning Services  CM Consult  Post Acute Care Choice:  NA Choice offered to:  NA  DME Arranged:  N/A DME Agency:  NA  HH Arranged:  NA HH Agency:  NA  Status of Service:  Completed, signed off  If discussed at [place] of Stay Meetings, dates discussed:    Additional Comments:  Erenest Rasher, RN 03/22/2016, 12:15 PM

## 2016-03-22 NOTE — Discharge Instructions (Signed)
Ask your doctor if you need PFTs.   Please take all your medications with you for your next visit with your Primary MD. Please request your Primary MD to go over all hospital test results at the follow up. Please ask your Primary MD to get all Hospital records sent to his/her office.  If you experience worsening of your admission symptoms, develop shortness of breath, chest pain, suicidal or homicidal thoughts or a life threatening emergency, you must seek medical attention immediately by calling 911 or calling your MD.  Dennis Bast must read the complete instructions/literature along with all the possible adverse reactions/side effects for all the medicines you take including new medications that have been prescribed to you. Take new medicines after you have completely understood and accpet all the possible adverse reactions/side effects.   Do not drive when taking pain medications or sedatives.    Do not take more than prescribed Pain, Sleep and Anxiety Medications  If you have smoked or chewed Tobacco in the last 2 yrs please stop. Stop any regular alcohol and or recreational drug use.  Wear Seat belts while driving.

## 2016-03-22 NOTE — Progress Notes (Signed)
[facility]  I have Examined Charles Daniel and reviewed the chart. He is much less short of breath today. Has been able to walk to the end of the hall and back. He feels comfortable with being discharged home today. Please see my updated d/c summary from 03/20/16.   Debbe Odea, MD

## 2016-03-22 NOTE — Progress Notes (Signed)
SATURATION QUALIFICATIONS: (This note is used to comply with regulatory documentation for home oxygen)  Patient Saturations on Room Air at Rest = 97%  Patient Saturations on Room Air while Ambulating = 93%  Please briefly explain why patient needs home oxygen: 

## 2016-03-22 NOTE — Progress Notes (Signed)
Pt noted for increased wob, sob, and wheezing on room air, spo2 93%.  Pt given his scheduled nebulizer treatment and placed on 2lnc.  Pt tolerated well.  RT will continue to monitor and assess as needed.

## 2016-03-24 ENCOUNTER — Telehealth: Payer: Self-pay | Admitting: *Deleted

## 2016-03-24 LAB — CULTURE, BLOOD (ROUTINE X 2)
Culture: NO GROWTH
Culture: NO GROWTH

## 2016-03-24 LAB — CULTURE, RESPIRATORY: CULTURE: NORMAL

## 2016-03-24 NOTE — Telephone Encounter (Signed)
Transition Care Management Follow-up Telephone Call  Per Discharge Summary: Admit date: 03/19/2016 Discharge date: 03/22/2016  Admitted From: home  Disposition:  home   Recommendations for Outpatient Follow-up:  1. ? Need fro PFTs  Discharge Condition:  stable   CODE STATUS:  Full code   Diet recommendation:   Heart heathy, low sodium  --   How have you been since you were released from the hospital? "I've been doing pretty good. Trying to take it easy."   Do you understand why you were in the hospital? yes   Do you understand the discharge instructions? yes   Where were you discharged to? Home   Items Reviewed:  Medications reviewed: yes  Allergies reviewed: yes  Dietary changes reviewed: no, none made per pt  Referrals reviewed: no, none made per pt   Functional Questionnaire:   Activities of Daily Living (ADLs):   He states they are independent in the following: ambulation, bathing and hygiene, feeding, continence, grooming, toileting and dressing States they require assistance with the following: none   Any transportation issues/concerns?: no   Any patient concerns? no   Confirmed importance and date/time of follow-up visits scheduled yes  Provider Appointment booked with Dr. [name] 04/03/16 @ 3:45pm.  Confirmed with patient if condition begins to worsen call PCP or go to the ER.  Patient was given the office number and encouraged to call back with question or concerns.  : yes

## 2016-04-03 ENCOUNTER — Ambulatory Visit (INDEPENDENT_AMBULATORY_CARE_PROVIDER_SITE_OTHER): Payer: Medicare Other | Admitting: Family Medicine

## 2016-04-03 ENCOUNTER — Encounter: Payer: Self-pay | Admitting: Family Medicine

## 2016-04-03 VITALS — BP 122/84 | HR 76 | Temp 98.3°F | Wt 145.0 lb

## 2016-04-03 DIAGNOSIS — J439 Emphysema, unspecified: Secondary | ICD-10-CM

## 2016-04-03 DIAGNOSIS — I10 Essential (primary) hypertension: Secondary | ICD-10-CM

## 2016-04-03 DIAGNOSIS — J441 Chronic obstructive pulmonary disease with (acute) exacerbation: Secondary | ICD-10-CM

## 2016-04-03 NOTE — Progress Notes (Signed)
BP 122/84 (BP Location: Left Arm, Patient Position: Sitting, Cuff Size: Normal)   Pulse 76   Temp 98.3 F (36.8 C) (Oral)   Wt 145 lb (65.8 kg)   SpO2 97%   BMI 22.05 kg/m    CC: hosp f/u visit Subjective:    Patient ID: Nikalus Laske, male    DOB: May 24, 1940, 75 y.o.   MRN: [id number]  HPI: Biran Ochocki is a 75 y.o. male presenting on 04/03/2016 for Hospitalization Follow-up (Was sent to the ER with Bronchitis. Stayed for 3 days. Still coughing alot. Some wheezing)   Recent hospitalization for COPD exacerbation and acute respiratory distress likely from viral illness. Flu, respiratory panel all negative, CXR normal. Treated with duonebs, azithormycin, prendisone course and albuterol and advair were started. Persistent mild productive cough with wheezing. He did have a lactic acidosis that improved. Sputum culture - normal respiratory flura.  Ex smoker, quit 2010 Denies fevers/chills.  Admit date: 03/19/2016 Discharge date: 03/22/2016 TCM phone call performed 03/24/2016  Recommendations for Outpatient Follow-up:  1. ? Need for PFTs  Discharge Condition:  stable   CODE STATUS:  Full code   Diet recommendation:   Heart heathy, low sodium  Discharge Diagnoses:  Principal Problem:   COPD exacerbation ([place]) Active Problems: Lactic acidosis SIRS URI   Human immunodeficiency virus (HIV) disease ([place])   Essential hypertension   Hypokalemia  Relevant past medical, surgical, family and social history reviewed and updated as indicated. Interim medical history since our last visit reviewed. Allergies and medications reviewed and updated. Current Outpatient Prescriptions on File Prior to Visit  Medication Sig  . albuterol (PROVENTIL HFA;VENTOLIN HFA) 108 (90 Base) MCG/ACT inhaler Inhale 2 puffs into the lungs every 6 (six) hours as needed for wheezing or shortness of breath.  . Fluticasone-Salmeterol (ADVAIR DISKUS) 250-50 MCG/DOSE AEPB Inhale 1 puff into the lungs daily.  Use every day for 1 wk.  . GENVOYA 150-150-200-10 MG TABS tablet TAKE 1 TABLET BY MOUTH DAILY WITH BREAKFAST   No current facility-administered medications on file prior to visit.     Review of Systems Per HPI unless specifically indicated in ROS section     Objective:    BP 122/84 (BP Location: Left Arm, Patient Position: Sitting, Cuff Size: Normal)   Pulse 76   Temp 98.3 F (36.8 C) (Oral)   Wt 145 lb (65.8 kg)   SpO2 97%   BMI 22.05 kg/m   Wt Readings from Last 3 Encounters:  04/03/16 145 lb (65.8 kg)  03/19/16 139 lb (63 kg)  03/19/16 138 lb 8 oz (62.8 kg)    Physical Exam  Constitutional: He appears well-developed and well-nourished. No distress.  HENT:  Head: Normocephalic and atraumatic.  Mouth/Throat: Oropharynx is clear and moist. No oropharyngeal exudate.  Eyes: Conjunctivae are normal. Pupils are equal, round, and reactive to light.  Neck: Normal range of motion. Neck supple.  Cardiovascular: Normal rate, regular rhythm, normal heart sounds and intact distal pulses.   No murmur heard. Pulmonary/Chest: Effort normal. No respiratory distress. He has no wheezes. He has no rales.  Coarse sounds bibasilarly  Lymphadenopathy:    He has no cervical adenopathy.  Skin: Skin is warm and dry. No rash noted.  Psychiatric: He has a normal mood and affect. His behavior is normal.  Nursing note and vitals reviewed.      Assessment & Plan:   Problem List Items Addressed This Visit    COPD exacerbation ([place]) - Primary    Recent COPD  exacerbation thought precipitated by viral bronchitis now largely resolving after zpack and prednisone course. Discussed albuterol/advair use as well as robitussin DM use.      Relevant Medications   guaiFENesin-dextromethorphan (ROBITUSSIN DM) 100-10 MG/5ML syrup   COPD with emphysema ([place])    Emphysema previously diagnosed by CXR. Now on advair 250 1 puff bid daily. Will return in 3 mo for spirometry.       Relevant Medications    guaiFENesin-dextromethorphan (ROBITUSSIN DM) 100-10 MG/5ML syrup   Essential hypertension    bp stable on hctz. Continue 25mg  daily. He has not been taking Kdur. Check K next visit.       Relevant Medications   hydrochlorothiazide (HYDRODIURIL) 25 MG tablet       Follow up plan: Return in about 3 months (around 07/02/2016) for follow up visit.  [name], MD

## 2016-04-03 NOTE — Progress Notes (Signed)
Pre visit review using our clinic review tool, if applicable. No additional management support is needed unless otherwise documented below in the visit note. 

## 2016-04-03 NOTE — Patient Instructions (Addendum)
I'm glad you're feeling better. Should continue improving each day. Blood pressure regimen - hydrochlorothiazide 25mg  daily.  For breathing, continue robitussin DM as needed, albuterol inhaler as needed for cough/wheezing, and take advair (fluticasone-salmeterol) 1 puff twice daily to help prevent bronchitis.  Return in 3 months for spirometry (breathing test in the office).

## 2016-04-03 NOTE — Assessment & Plan Note (Addendum)
Recent COPD exacerbation thought precipitated by viral bronchitis now largely resolving after zpack and prednisone course. Discussed albuterol/advair use as well as robitussin DM use.

## 2016-04-03 NOTE — Assessment & Plan Note (Signed)
bp stable on hctz. Continue 25mg  daily. He has not been taking Kdur. Check K next visit.

## 2016-04-03 NOTE — Assessment & Plan Note (Signed)
Emphysema previously diagnosed by CXR. Now on advair 250 1 puff bid daily. Will return in 3 mo for spirometry.

## 2016-04-09 MED FILL — GENVOYA TABLET: 150-150-200 | 30 days supply | Qty: 30 | Fill #2

## 2016-05-07 MED FILL — GENVOYA TABLET: 150-150-200 | 30 days supply | Qty: 30 | Fill #3

## 2016-06-03 MED FILL — GENVOYA TABLET: 150-150-200 | 30 days supply | Qty: 30 | Fill #4 | Status: TO

## 2016-06-08 DIAGNOSIS — C61 Malignant neoplasm of prostate: Secondary | ICD-10-CM | POA: Diagnosis not present

## 2016-06-11 ENCOUNTER — Encounter: Payer: Self-pay | Admitting: Gastroenterology

## 2016-06-15 DIAGNOSIS — Z8546 Personal history of malignant neoplasm of prostate: Secondary | ICD-10-CM | POA: Diagnosis not present

## 2016-07-01 MED FILL — GENVOYA TABLET: 150-150-200 | 30 days supply | Qty: 30 | Fill #0

## 2016-07-03 ENCOUNTER — Other Ambulatory Visit: Payer: Medicare Other

## 2016-07-03 ENCOUNTER — Ambulatory Visit (INDEPENDENT_AMBULATORY_CARE_PROVIDER_SITE_OTHER): Payer: Medicare Other

## 2016-07-03 ENCOUNTER — Other Ambulatory Visit: Payer: Self-pay | Admitting: Family Medicine

## 2016-07-03 VITALS — BP 116/80 | HR 81 | Temp 98.1°F | Ht 66.0 in | Wt 148.0 lb

## 2016-07-03 DIAGNOSIS — B20 Human immunodeficiency virus [HIV] disease: Secondary | ICD-10-CM | POA: Diagnosis not present

## 2016-07-03 DIAGNOSIS — I10 Essential (primary) hypertension: Secondary | ICD-10-CM | POA: Diagnosis not present

## 2016-07-03 DIAGNOSIS — Z125 Encounter for screening for malignant neoplasm of prostate: Secondary | ICD-10-CM

## 2016-07-03 DIAGNOSIS — Z0000 Encounter for general adult medical examination without abnormal findings: Secondary | ICD-10-CM

## 2016-07-03 DIAGNOSIS — C61 Malignant neoplasm of prostate: Secondary | ICD-10-CM

## 2016-07-03 LAB — CBC WITH DIFFERENTIAL/PLATELET
Basophils Absolute: 0.1 10*3/uL (ref 0.0–0.1)
Basophils Relative: 1 % (ref 0.0–3.0)
EOS PCT: 4.9 % (ref 0.0–5.0)
Eosinophils Absolute: 0.4 10*3/uL (ref 0.0–0.7)
HEMATOCRIT: 41.9 % (ref 39.0–52.0)
HEMOGLOBIN: 13.8 g/dL (ref 13.0–17.0)
LYMPHS PCT: 35.7 % (ref 12.0–46.0)
Lymphs Abs: 2.8 10*3/uL (ref 0.7–4.0)
MCHC: 33 g/dL (ref 30.0–36.0)
MCV: 85.8 fl (ref 78.0–100.0)
MONO ABS: 0.6 10*3/uL (ref 0.1–1.0)
MONOS PCT: 8.1 % (ref 3.0–12.0)
Neutro Abs: 3.9 10*3/uL (ref 1.4–7.7)
Neutrophils Relative %: 50.3 % (ref 43.0–77.0)
Platelets: 233 10*3/uL (ref 150.0–400.0)
RBC: 4.88 Mil/uL (ref 4.22–5.81)
RDW: 15.9 % — ABNORMAL HIGH (ref 11.5–15.5)
WBC: 7.9 10*3/uL (ref 4.0–10.5)

## 2016-07-03 LAB — LIPID PANEL
CHOL/HDL RATIO: 5
Cholesterol: 172 mg/dL (ref 0–200)
HDL: 37.8 mg/dL — ABNORMAL LOW (ref >39.00)
LDL Cholesterol: 108 mg/dL — ABNORMAL HIGH (ref 0–99)
NONHDL: 134.22
Triglycerides: 133 mg/dL (ref 0.0–149.0)
VLDL: 26.6 mg/dL (ref 0.0–40.0)

## 2016-07-03 LAB — BASIC METABOLIC PANEL
BUN: 11 mg/dL (ref 6–23)
CO2: 33 mEq/L — ABNORMAL HIGH (ref 19–32)
Calcium: 9.5 mg/dL (ref 8.4–10.5)
Chloride: 101 mEq/L (ref 96–112)
Creatinine, Ser: 1.03 mg/dL (ref 0.40–1.50)
GFR: 90.24 mL/min (ref >60.00)
Glucose, Bld: 85 mg/dL (ref 70–99)
POTASSIUM: 3.3 meq/L — AB (ref 3.5–5.1)
SODIUM: 138 meq/L (ref 135–145)

## 2016-07-03 LAB — PSA, MEDICARE: PSA: 1.53 ng/mL (ref 0.10–4.00)

## 2016-07-03 NOTE — Progress Notes (Signed)
I reviewed health advisor's note, was available for consultation, and agree with documentation and plan.  

## 2016-07-03 NOTE — Patient Instructions (Signed)
Charles Daniel , Thank you for taking time to come for your Medicare Wellness Visit. I appreciate your ongoing commitment to your health goals. Please review the following plan we discussed and let me know if I can assist you in the future.   These are the goals we discussed: Goals    . Increase physical activity          Starting 07/03/16, I will continue to walk at least 30 min daily.        This is a list of the screening recommended for you and due dates:  Health Maintenance  Topic Date Due  . DTaP/Tdap/Td vaccine (1 - Tdap) 08/03/2022*  . Colon Cancer Screening  08/03/2016  . Tetanus Vaccine  08/03/2022  . Flu Shot  Completed  . Pneumonia vaccines  Completed  *Topic was postponed. The date shown is not the original due date.   Preventive Care for Adults  A healthy lifestyle and preventive care can promote health and wellness. Preventive health guidelines for adults include the following key practices.  . A routine yearly physical is a good way to check with your health care provider about your health and preventive screening. It is a chance to share any concerns and updates on your health and to receive a thorough exam.  . Visit your dentist for a routine exam and preventive care every 6 months. Brush your teeth twice a day and floss once a day. Good oral hygiene prevents tooth decay and gum disease.  . The frequency of eye exams is based on your age, health, family medical history, use  of contact lenses, and other factors. Follow your health care provider's ecommendations for frequency of eye exams.  . Eat a healthy diet. Foods like vegetables, fruits, whole grains, low-fat dairy products, and lean protein foods contain the nutrients you need without too many calories. Decrease your intake of foods high in solid fats, added sugars, and salt. Eat the right amount of calories for you. Get information about a proper diet from your health care provider, if necessary.  . Regular  physical exercise is one of the most important things you can do for your health. Most adults should get at least 150 minutes of moderate-intensity exercise (any activity that increases your heart rate and causes you to sweat) each week. In addition, most adults need muscle-strengthening exercises on 2 or more days a week.  Silver Sneakers may be a benefit available to you. To determine eligibility, you may visit the website: www.silversneakers.com or contact program at (830)401-1268 Mon-Fri between 8AM-8PM.   . Maintain a healthy weight. The body mass index (BMI) is a screening tool to identify possible weight problems. It provides an estimate of body fat based on height and weight. Your health care provider can find your BMI and can help you achieve or maintain a healthy weight.   For adults 20 years and older: ? A BMI below 18.5 is considered underweight. ? A BMI of 18.5 to 24.9 is normal. ? A BMI of 25 to 29.9 is considered overweight. ? A BMI of 30 and above is considered obese.   . Maintain normal blood lipids and cholesterol levels by exercising and minimizing your intake of saturated fat. Eat a balanced diet with plenty of fruit and vegetables. Blood tests for lipids and cholesterol should begin at age 105 and be repeated every 5 years. If your lipid or cholesterol levels are high, you are over 50, or you are at high risk  for heart disease, you may need your cholesterol levels checked more frequently. Ongoing high lipid and cholesterol levels should be treated with medicines if diet and exercise are not working.  . If you smoke, find out from your health care provider how to quit. If you do not use tobacco, please do not start.  . If you choose to drink alcohol, please do not consume more than 2 drinks per day. One drink is considered to be 12 ounces (355 mL) of beer, 5 ounces (148 mL) of wine, or 1.5 ounces (44 mL) of liquor.  . If you are 80-7 years old, ask your health care provider if  you should take aspirin to prevent strokes.  . Use sunscreen. Apply sunscreen liberally and repeatedly throughout the day. You should seek shade when your shadow is shorter than you. Protect yourself by wearing long sleeves, pants, a wide-brimmed hat, and sunglasses year round, whenever you are outdoors.  . Once a month, do a whole body skin exam, using a mirror to look at the skin on your back. Tell your health care provider of new moles, moles that have irregular borders, moles that are larger than a pencil eraser, or moles that have changed in shape or color.

## 2016-07-03 NOTE — Progress Notes (Signed)
PCP notes:   Health maintenance:   No gaps identified.  Abnormal screenings:   Hearing - failed  Patient concerns:   None  Nurse concerns:  None  Next PCP appt:   08/13/16 @ 1600

## 2016-07-03 NOTE — Progress Notes (Signed)
Subjective:   Charles Daniel is a 76 y.o. male who presents for Medicare Annual/Subsequent preventive examination.  Review of Systems:  N/A Cardiac Risk Factors include: advanced age (>58men, >75 women);male gender;hypertension     Objective:    Vitals: BP 116/80 (BP Location: Right Arm, Patient Position: Sitting, Cuff Size: Normal)   Pulse 81   Temp 98.1 F (36.7 C) (Oral)   Ht 5\' 6"  (1.676 m) Comment: no shoes  Wt 148 lb (67.1 kg)   SpO2 96%   BMI 23.89 kg/m   Body mass index is 23.89 kg/m.  Tobacco History  Smoking Status  . Former Smoker  . Packs/day: 0.30  . Years: 35.00  . Types: Cigarettes  . Quit date: 03/04/2009  Smokeless Tobacco  . Never Used     Counseling given: No   Past Medical History:  Diagnosis Date  . Bilateral hydrocele 2012  . Depression   . Diverticulosis 2013   by colonoscopy  . 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 History of colon cancer    2007 --  S/P RECTOSIGMOID COLECTOMY--  NO CHEMORADIATION--  NO RECURRENCE  . History of CVA (cerebrovascular accident)    2003-  RIGHT MIDDLE CVA---   NO RESIDUAL  . History of hepatitis B    REMOTE AND INACTIVE  PER DOCUMENTATION  . History of syphilis    SECONDARY SYPHILITIS TX'D IN 1998  PER DOCUMENTATION  . History of tuberculosis    LATENT TB  TX'D X12  MONTHS IN 1995  . HIV infection ([place])    [place] (DR CAMPBELL)  . Hypertension   . Prostate carcinoma [facility]) dx 09/2012   T1c, brachytherapy/seed implant Karsten Ro, Tammi Klippel)   Past Surgical History:  Procedure Laterality Date  . COLONOSCOPY  08/2011   3 polyps, diverticulosis, rec rpt 5 yrs [name])  . INGUINAL HERNIA REPAIR  1994   UNILATERAL  . LOW ANTERIOR RESECTION RECTOSIGMOID COLON  03-09-2006  . PROSTATE BIOPSY  09/2012   53cc  (MD OFFICE)  . RADIOACTIVE SEED IMPLANT N/A 01/20/2013   Procedure: RADIOACTIVE SEED IMPLANT;  Surgeon: Claybon Jabs, MD;  as well as Milana Kidney MD  . TRANSTHORACIC ECHOCARDIOGRAM  11-16-2001   LV WALL THICKNESS MODERATELY INCREASED/  EF 55-65%/ LVSF NORMAL   Family History  Problem Relation Age of Onset  . Cancer Sister     breast  . Cancer Brother 101    prostate, treated with seed implant  . Cancer Brother     prostate  . Cancer Brother     prostate  . Cancer Daughter     breast  . Cancer Other     prostate  . Cancer Father     unsure  . CAD Neg Hx   . Stroke Neg Hx   . Diabetes Neg Hx    History  Sexual Activity  . Sexual activity: Not on file    Comment: declined condoms    Outpatient Encounter Prescriptions as of 07/03/2016  Medication Sig  . albuterol (PROVENTIL HFA;VENTOLIN HFA) 108 (90 Base) MCG/ACT inhaler Inhale 2 puffs into the lungs every 6 (six) hours as needed for wheezing or shortness of breath.  . Fluticasone-Salmeterol (ADVAIR DISKUS) 250-50 MCG/DOSE AEPB Inhale 1 puff into the lungs daily. Use every day for 1 wk. (Patient taking differently: Inhale 1 puff into the lungs as needed. Use every day for 1 wk.)  . GENVOYA 150-150-200-10  MG TABS tablet TAKE 1 TABLET BY MOUTH DAILY WITH BREAKFAST  . hydrochlorothiazide (HYDRODIURIL) 25 MG tablet Take 1 tablet (25 mg total) by mouth daily.  . [DISCONTINUED] guaiFENesin-dextromethorphan (ROBITUSSIN DM) 100-10 MG/5ML syrup Take 5 mLs by mouth 2 (two) times daily as needed for cough.   No facility-administered encounter medications on file as of 07/03/2016.     Activities of Daily Living In your present state of health, do you have any difficulty performing the following activities: 07/03/2016 03/19/2016  Hearing? N N  Vision? N N  Difficulty concentrating or making decisions? N N  Walking or climbing stairs? N N  Dressing or bathing? N N  Doing errands, shopping? N N  Preparing Food and eating ? N -  Using the Toilet? N -  In the past six months, have you accidently leaked urine? N -  Do you have problems with loss of bowel control? N -  Managing  your Medications? N -  Managing your Finances? N -  Housekeeping or managing your Housekeeping? N -  Some recent data might be hidden    Patient Care Team: [name], MD as PCP - General (Family Medicine) Michel Bickers, MD as PCP - Infectious Diseases (Infectious Diseases)   Assessment:     Hearing Screening   125Hz  250Hz  500Hz  1000Hz  2000Hz  3000Hz  4000Hz  6000Hz  8000Hz   Right ear:   40 40 0  0    Left ear:   40 40 0  0      Visual Acuity Screening   Right eye Left eye Both eyes  Without correction: 20/30 20/30 20/25   With correction:       Exercise Activities and Dietary recommendations Current Exercise Habits: Home exercise routine, Type of exercise: walking, Time (Minutes): 30, Frequency (Times/Week): 7, Weekly Exercise (Minutes/Week): 210, Intensity: Mild, Exercise limited by: None identified  Goals    . Increase physical activity          Starting 07/03/16, I will continue to walk at least 30 min daily.       Fall Risk Fall Risk  07/03/2016 12/10/2015 06/28/2015 05/30/2015 02/27/2015  Falls in the past year? No No No No No   Depression Screen PHQ 2/9 Scores 07/03/2016 12/10/2015 06/28/2015 05/30/2015  PHQ - 2 Score 0 0 0 0    Cognitive Function MMSE - Mini Mental State Exam 07/03/2016  Orientation to time 5  Orientation to Place 5  Registration 3  Attention/ Calculation 0  Recall 3  Language- name 2 objects 0  Language- repeat 1  Language- follow 3 step command 3  Language- read & follow direction 0  Write a sentence 0  Copy design 0  Total score 20     PLEASE NOTE: A Mini-Cog screen was completed. Maximum score is 20. A value of 0 denotes this part of [place]stein MMSE was not completed or the patient failed this part of the Mini-Cog screening.   Mini-Cog Screening Orientation to Time - Max 5 pts Orientation to Place - Max 5 pts Registration - Max 3 pts Recall - Max 3 pts Language Repeat - Max 1 pts Language Follow 3 Step Command - Max 3 pts      Immunization History  Administered Date(s) Administered  . H1N1 06/07/2008  . Influenza Split 03/10/2011, 03/22/2012  . Influenza Whole 01/19/2006, 02/15/2007, 03/16/2008, 04/02/2009, 04/22/2010  . Influenza,inj,Quad PF,36+ Mos 12/19/2013, 02/27/2015, 01/07/2016  . PPD Test 06/28/2006  . Pneumococcal Conjugate-13 01/22/2016  . Pneumococcal Polysaccharide-23 07/23/2005, 09/30/2010  .  Td 08/02/2012   Screening Tests Health Maintenance  Topic Date Due  . DTaP/Tdap/Td (1 - Tdap) 08/03/2022 (Originally 08/03/2012)  . COLONOSCOPY  08/03/2016  . TETANUS/TDAP  08/03/2022  . INFLUENZA VACCINE  Completed  . PNA vac Low Risk Adult  Completed      Plan:     I have personally reviewed and addressed the Medicare Annual Wellness questionnaire and have noted the following in the patient's chart:  A. Medical and social history B. Use of alcohol, tobacco or illicit drugs  C. Current medications and supplements D. Functional ability and status E.  Nutritional status F.  Physical activity G. Advance directives H. List of other physicians I.  Hospitalizations, surgeries, and ER visits in previous 12 months J.  [place] to include hearing, vision, cognitive, depression L. Referrals and appointments - none  In addition, I have reviewed and discussed with patient certain preventive protocols, quality metrics, and best practice recommendations. A written personalized care plan for preventive services as well as general preventive health recommendations were provided to patient.  See attached scanned questionnaire for additional information.   Signed,   Lindell Noe, MHA, BS, LPN Health Coach

## 2016-07-03 NOTE — Progress Notes (Signed)
Pre visit review using our clinic review tool, if applicable. No additional management support is needed unless otherwise documented below in the visit note. 

## 2016-07-05 ENCOUNTER — Other Ambulatory Visit: Payer: Self-pay | Admitting: Family Medicine

## 2016-07-05 MED ORDER — POTASSIUM CHLORIDE ER 10 MEQ PO TBCR: 10.0000 meq | EXTENDED_RELEASE_TABLET | ORAL | 11 refills | Status: DC

## 2016-07-10 ENCOUNTER — Encounter: Payer: Medicare Other | Admitting: Family Medicine

## 2016-07-29 MED FILL — GENVOYA TABLET: 150-150-200 | 30 days supply | Qty: 30 | Fill #1

## 2016-08-13 ENCOUNTER — Encounter: Payer: Self-pay | Admitting: Family Medicine

## 2016-08-13 ENCOUNTER — Ambulatory Visit (INDEPENDENT_AMBULATORY_CARE_PROVIDER_SITE_OTHER): Payer: Medicare Other | Admitting: Family Medicine

## 2016-08-13 VITALS — BP 142/88 | HR 82 | Temp 98.4°F | Wt 146.5 lb

## 2016-08-13 DIAGNOSIS — Z85048 Personal history of other malignant neoplasm of rectum, rectosigmoid junction, and anus: Secondary | ICD-10-CM

## 2016-08-13 DIAGNOSIS — C61 Malignant neoplasm of prostate: Secondary | ICD-10-CM | POA: Diagnosis not present

## 2016-08-13 DIAGNOSIS — J439 Emphysema, unspecified: Secondary | ICD-10-CM | POA: Diagnosis not present

## 2016-08-13 DIAGNOSIS — I10 Essential (primary) hypertension: Secondary | ICD-10-CM

## 2016-08-13 DIAGNOSIS — Z7189 Other specified counseling: Secondary | ICD-10-CM | POA: Diagnosis not present

## 2016-08-13 DIAGNOSIS — Z87891 Personal history of nicotine dependence: Secondary | ICD-10-CM

## 2016-08-13 DIAGNOSIS — E876 Hypokalemia: Secondary | ICD-10-CM | POA: Diagnosis not present

## 2016-08-13 DIAGNOSIS — B20 Human immunodeficiency virus [HIV] disease: Secondary | ICD-10-CM | POA: Diagnosis not present

## 2016-08-13 MED ORDER — FLUTICASONE FUROATE-VILANTEROL 100-25 MCG/INH IN AEPB: 1.0000 | INHALATION_SPRAY | Freq: Every day | RESPIRATORY_TRACT | 11 refills | Status: DC

## 2016-08-13 NOTE — Progress Notes (Signed)
Pre visit review using our clinic review tool, if applicable. No additional management support is needed unless otherwise documented below in the visit note. 

## 2016-08-13 NOTE — Patient Instructions (Addendum)
We will refer you for repeat colonoscopy.  Start breo inhaler 1 daily for breathing and to prevent respiratory infections.  Continue mucinex.  Let us know if fever >101 or worsening productive cough.  Return as needed or in 6 months for spirometry - breathing test.

## 2016-08-13 NOTE — Assessment & Plan Note (Signed)
Will refer back to GI for rpt colonoscopy - due this year.

## 2016-08-13 NOTE — Assessment & Plan Note (Signed)
Congratulated on smoking cessation. Not candidate for lung cancer screening CT.

## 2016-08-13 NOTE — Assessment & Plan Note (Signed)
HCTZ related - we have started Kdur 9mEq MWF.

## 2016-08-13 NOTE — Assessment & Plan Note (Signed)
Chronic, stable on current regimen of hctz 25mg  daily. Start Kdur 69mEq MWF for hypokalemia.

## 2016-08-13 NOTE — Progress Notes (Signed)
BP (!) 142/88 (BP Location: Right Arm, Cuff Size: Normal)   Pulse 82   Temp 98.4 F (36.9 C) (Oral)   Wt 146 lb 8 oz (66.5 kg)   SpO2 95%   BMI 23.65 kg/m    CC: AMW f/u Subjective:    Patient ID: Charles Daniel, male    DOB: 1941/03/24, 76 y.o.   MRN: [id number]  HPI: Charles Daniel is a 76 y.o. male presenting on 08/13/2016 for Annual Exam and URI (cough and congestion x2 weeks; OTC meds no help)   Saw Lesia last month for medicare wellness visit. Note reviewed.   2 wk h/o cough/congestion symptoms without improvement despite OTC medications (robitussin, mucinex, orange juice). Head and chest congestion. Wheezing, productive cough. No dyspnea or fevers. No sick contacts at home. Known h/o emphysema in ex smoker (quit 2010).   Preventative: Procedure COLONOSCOPY Date: 08/2011 3 polyps, diverticulosis, rec rpt 5 yrs [name]).  H/o prostate cancer - sees Ottelin yearly s/p radioactive seed implant 01/2013. Lung cancer screening - not candidate - smoking 1/3 ppd for 30 yrs Flu shot yearly Pneumovax - 09/2010 and 2007. Prevnar 01/2016 Td - 08/2012 Shingles shot - declines for financial reasons Advanced directive discussion - doesn't have set up, declines packet. Would like wife to be HCPOA. Doesn't want prolonged life support if terminal condition. Ok for trial if he could improve.  Seat belt use discussed Sunscreen use discussed. No changing moles on skin Ex smoker - quit 2010 Alcohol - none  Caffeine: 3 sodas/day Lives with wife. 2 grown sons. 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 Interim medical history since our last visit reviewed. Allergies and medications reviewed and updated. Outpatient Medications Prior to Visit  Medication Sig  Dispense Refill  . albuterol (PROVENTIL HFA;VENTOLIN HFA) 108 (90 Base) MCG/ACT inhaler Inhale 2 puffs into the lungs every 6 (six) hours as needed for wheezing or shortness of breath. 1 Inhaler 2  . GENVOYA 150-150-200-10 MG TABS tablet TAKE 1 TABLET BY MOUTH DAILY WITH BREAKFAST 30 tablet 11  . hydrochlorothiazide (HYDRODIURIL) 25 MG tablet Take 1 tablet (25 mg total) by mouth daily.    . potassium chloride (K-DUR) 10 MEQ tablet Take 1 tablet (10 mEq total) by mouth every Monday, Wednesday, and Friday. 30 tablet 11  . Fluticasone-Salmeterol (ADVAIR DISKUS) 250-50 MCG/DOSE AEPB Inhale 1 puff into the lungs daily. Use every day for 1 wk. (Patient taking differently: Inhale 1 puff into the lungs as needed. Use every day for 1 wk.) 1 each 0   No facility-administered medications prior to visit.      Per HPI unless specifically indicated in ROS section below Review of Systems     Objective:    BP (!) 142/88 (BP Location: Right Arm, Cuff Size: Normal)   Pulse 82   Temp 98.4 F (36.9 C) (Oral)   Wt 146 lb 8 oz (66.5 kg)   SpO2 95%   BMI 23.65 kg/m   Wt Readings from Last 3 Encounters:  08/13/16 146 lb 8 oz (66.5 kg)  07/03/16 148 lb (67.1 kg)  04/03/16 145 lb (65.8 kg)    Physical Exam  Constitutional: He is oriented to person, place, and time. He appears well-developed and well-nourished. No distress.  HENT:  Head: Normocephalic and atraumatic.  Right Ear: Hearing, tympanic membrane, external ear and ear canal normal.  Left Ear: Hearing, tympanic membrane, external ear and ear canal normal.  Nose: Nose normal.  Mouth/Throat: Uvula is midline, oropharynx is clear and moist and mucous membranes are normal. No oropharyngeal exudate, posterior oropharyngeal edema or posterior oropharyngeal erythema.  Eyes: Conjunctivae and EOM are normal. Pupils are equal, round, and reactive to light. No scleral icterus.  Neck: Normal range of motion. Neck supple.  Cardiovascular: Normal rate, regular  rhythm, normal heart sounds and intact distal pulses.   No murmur heard. Pulses:      Radial pulses are 2+ on the right side, and 2+ on the left side.  Pulmonary/Chest: Effort normal and breath sounds normal. No respiratory distress. He has no decreased breath sounds. He has no wheezes. He has no rhonchi. He has no rales.  Coarse   Abdominal: Soft. Bowel sounds are normal. He exhibits no distension and no mass. There is no tenderness. There is no rebound and no guarding.  Musculoskeletal: Normal range of motion. He exhibits no edema.  Lymphadenopathy:    He has no cervical adenopathy.  Neurological: He is alert and oriented to person, place, and time.  CN grossly intact, station and gait intact  Skin: Skin is warm and dry. No rash noted.  Psychiatric: He has a normal mood and affect. His behavior is normal. Judgment and thought content normal.  Nursing note and vitals reviewed.  Results for orders placed or performed in visit on 07/03/16  Lipid panel  Result Value Ref Range   Cholesterol 172 0 - 200 mg/dL   Triglycerides 133.0 0.0 - 149.0 mg/dL   HDL 37.80 (L) >39.00 mg/dL   VLDL 26.6 0.0 - 40.0 mg/dL   LDL Cholesterol 108 (H) 0 - 99 mg/dL   Total CHOL/HDL Ratio 5    NonHDL 465.03   Basic metabolic panel  Result Value Ref Range   Sodium 138 135 - 145 mEq/L   Potassium 3.3 (L) 3.5 - 5.1 mEq/L   Chloride 101 96 - 112 mEq/L   CO2 33 (H) 19 - 32 mEq/L   Glucose, Bld 85 70 - 99 mg/dL   BUN 11 6 - 23 mg/dL   Creatinine, Ser 1.03 0.40 - 1.50 mg/dL   Calcium 9.5 8.4 - 10.5 mg/dL   GFR 90.24 >60.00 mL/min  CBC with Differential/Platelet  Result Value Ref Range   WBC 7.9 4.0 - 10.5 K/uL   RBC 4.88 4.22 - 5.81 Mil/uL   Hemoglobin 13.8 13.0 - 17.0 g/dL   HCT 41.9 39.0 - 52.0 %   MCV 85.8 78.0 - 100.0 fl   MCHC 33.0 30.0 - 36.0 g/dL   RDW 15.9 (H) 11.5 - 15.5 %   Platelets 233.0 150.0 - 400.0 K/uL   Neutrophils Relative % 50.3 43.0 - 77.0 %   Lymphocytes Relative 35.7 12.0 - 46.0 %    Monocytes Relative 8.1 3.0 - 12.0 %   Eosinophils Relative 4.9 0.0 - 5.0 %   Basophils Relative 1.0 0.0 - 3.0 %   Neutro Abs 3.9 1.4 - 7.7 K/uL   Lymphs Abs 2.8 0.7 - 4.0 K/uL   Monocytes Absolute 0.6 0.1 - 1.0 K/uL   Eosinophils Absolute 0.4 0.0 - 0.7 K/uL   Basophils Absolute 0.1 0.0 - 0.1 K/uL  PSA, Medicare  Result Value Ref Range   PSA 1.53 0.10 - 4.00 ng/ml      Assessment & Plan:   Problem List Items Addressed This  Visit    Advanced care planning/counseling discussion    Advanced directive discussion - doesn't have set up, declines packet. Would like wife to be HCPOA. Doesn't want prolonged life support if terminal condition. Ok for trial if he could improve.       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 Has never returned for spirometry. I don't see evidence of bacterial disease today but will start him on breo daily. Advised to price out Rx and update [place] if unaffordable. I asked him to return in 6 months for spirometry.       Relevant Medications   fluticasone furoate-vilanterol (BREO ELLIPTA) 100-25 MCG/INH AEPB   Essential hypertension    Chronic, stable on current regimen of hctz 25mg  daily. Start Kdur 62mEq MWF for hypokalemia.       Ex-smoker    Congratulated on smoking cessation. Not candidate for lung cancer screening CT.      History of rectal cancer    Will refer back to GI for rpt colonoscopy - due this year.       Relevant Orders   Ambulatory referral to Gastroenterology   Human immunodeficiency virus (HIV) disease ([place])    Continue f/u with ID Dr Megan Salon, seen yearly.       Hypokalemia    HCTZ related - we have started Kdur 2mEq MWF.       Prostate CA ([place]) (Chronic)    Appreciate uro care - sees Dr Karsten Ro. I gave him copy of labs to share with uro.           Follow up plan: Return in about 6 months (around 02/12/2017) for follow up visit.  [name], MD

## 2016-08-13 NOTE — Assessment & Plan Note (Signed)
Recurrent bronchitis with chronic chest congestion endorsed. Has never returned for spirometry. I don't see evidence of bacterial disease today but will start him on breo daily. Advised to price out Rx and update [place] if unaffordable. I asked him to return in 6 months for spirometry.

## 2016-08-13 NOTE — Assessment & Plan Note (Addendum)
Advanced directive discussion - doesn't have set up, declines packet. Would like wife to be HCPOA. Doesn't want prolonged life support if terminal condition. Ok for trial if he could improve.

## 2016-08-13 NOTE — Assessment & Plan Note (Signed)
Appreciate uro care - sees Dr Karsten Ro. I gave him copy of labs to share with uro.

## 2016-08-13 NOTE — Assessment & Plan Note (Signed)
Continue f/u with ID Dr Megan Salon, seen yearly.

## 2016-08-17 ENCOUNTER — Encounter: Payer: Self-pay | Admitting: Gastroenterology

## 2016-08-27 MED FILL — GENVOYA TABLET: 150-150-200 | 30 days supply | Qty: 30 | Fill #2

## 2016-09-15 DIAGNOSIS — L821 Other seborrheic keratosis: Secondary | ICD-10-CM | POA: Diagnosis not present

## 2016-09-15 DIAGNOSIS — B081 Molluscum contagiosum: Secondary | ICD-10-CM | POA: Diagnosis not present

## 2016-09-24 MED FILL — GENVOYA TABLET: 150-150-200 | 30 days supply | Qty: 30 | Fill #3

## 2016-10-02 HISTORY — PX: COLONOSCOPY: SHX174

## 2016-10-06 ENCOUNTER — Ambulatory Visit (AMBULATORY_SURGERY_CENTER): Payer: Self-pay

## 2016-10-06 VITALS — Ht 66.0 in | Wt 145.4 lb

## 2016-10-06 DIAGNOSIS — Z85048 Personal history of other malignant neoplasm of rectum, rectosigmoid junction, and anus: Secondary | ICD-10-CM

## 2016-10-06 DIAGNOSIS — Z86010 Personal history of colonic polyps: Secondary | ICD-10-CM

## 2016-10-06 MED ORDER — NA SULFATE-K SULFATE-MG SULF 17.5-3.13-1.6 GM/177ML PO SOLN: ORAL | 0 refills | Status: DC

## 2016-10-06 NOTE — Progress Notes (Signed)
Per pt, no allergies to soy or egg products.Pt not taking any weight loss meds or using  O2 at home.   Pt refused Emmi video. 

## 2016-10-12 ENCOUNTER — Other Ambulatory Visit: Payer: Self-pay | Admitting: Family Medicine

## 2016-10-12 DIAGNOSIS — I10 Essential (primary) hypertension: Secondary | ICD-10-CM

## 2016-10-12 NOTE — Telephone Encounter (Signed)
OV note 08/13/2016 states con't HCTZ, RTO for f/u 02/2017

## 2016-10-14 DIAGNOSIS — B081 Molluscum contagiosum: Secondary | ICD-10-CM | POA: Diagnosis not present

## 2016-10-20 ENCOUNTER — Ambulatory Visit (AMBULATORY_SURGERY_CENTER): Payer: Medicare Other | Admitting: Gastroenterology

## 2016-10-20 ENCOUNTER — Encounter: Payer: Self-pay | Admitting: Gastroenterology

## 2016-10-20 VITALS — BP 154/93 | HR 63 | Temp 98.0°F | Resp 12 | Ht 66.0 in | Wt 145.0 lb

## 2016-10-20 DIAGNOSIS — Z1211 Encounter for screening for malignant neoplasm of colon: Secondary | ICD-10-CM | POA: Diagnosis not present

## 2016-10-20 DIAGNOSIS — D124 Benign neoplasm of descending colon: Secondary | ICD-10-CM | POA: Diagnosis not present

## 2016-10-20 DIAGNOSIS — J439 Emphysema, unspecified: Secondary | ICD-10-CM | POA: Diagnosis not present

## 2016-10-20 DIAGNOSIS — D123 Benign neoplasm of transverse colon: Secondary | ICD-10-CM | POA: Diagnosis not present

## 2016-10-20 DIAGNOSIS — D120 Benign neoplasm of cecum: Secondary | ICD-10-CM

## 2016-10-20 DIAGNOSIS — D122 Benign neoplasm of ascending colon: Secondary | ICD-10-CM | POA: Diagnosis not present

## 2016-10-20 DIAGNOSIS — I10 Essential (primary) hypertension: Secondary | ICD-10-CM | POA: Diagnosis not present

## 2016-10-20 DIAGNOSIS — Z86010 Personal history of colonic polyps: Secondary | ICD-10-CM | POA: Diagnosis present

## 2016-10-20 DIAGNOSIS — Z8673 Personal history of transient ischemic attack (TIA), and cerebral infarction without residual deficits: Secondary | ICD-10-CM | POA: Diagnosis not present

## 2016-10-20 MED ORDER — SODIUM CHLORIDE 0.9 % IV SOLN: 500.0000 mL | INTRAVENOUS | Status: DC

## 2016-10-20 NOTE — Progress Notes (Signed)
Report to PACU, RN, vss, BBS= Clear.  

## 2016-10-20 NOTE — Progress Notes (Signed)
Called to room to assist during endoscopic procedure.  Patient ID and intended procedure confirmed with present staff. Received instructions for my participation in the procedure from the performing physician.  

## 2016-10-20 NOTE — Patient Instructions (Signed)
NO ASPIRIN, ASPIRIN PRODUCTS OR NSAIDS(MOTRIN, IBUPROFEN, ADVIL, ALEVE, NAPROSYN, ETC) FOR 7 DAYS, October 27, 2016.  HANDOUTS GIVEN: POLYPS AND DIVERTICULOSIS    YOU HAD AN ENDOSCOPIC PROCEDURE TODAY AT [facility]:   Refer to the procedure report that was given to you for any specific questions about what was found during the examination.  If the procedure report does not answer your questions, please call your gastroenterologist to clarify.  If you requested that your care partner not be given the details of your procedure findings, then the procedure report has been included in a sealed envelope for you to review at your convenience later.  YOU SHOULD EXPECT: Some feelings of bloating in the abdomen. Passage of more gas than usual.  Walking can help get rid of the air that was put into your GI tract during the procedure and reduce the bloating. If you had a lower endoscopy (such as a colonoscopy or flexible sigmoidoscopy) you may notice spotting of blood in your stool or on the toilet paper. If you underwent a bowel prep for your procedure, you may not have a normal bowel movement for a few days.  Please Note:  You might notice some irritation and congestion in your nose or some drainage.  This is from the oxygen used during your procedure.  There is no need for concern and it should clear up in a day or so.  SYMPTOMS TO REPORT IMMEDIATELY:   Following lower endoscopy (colonoscopy or flexible sigmoidoscopy):  Excessive amounts of blood in the stool  Significant tenderness or worsening of abdominal pains  Swelling of the abdomen that is new, acute  Fever of 100F or higher   For urgent or emergent issues, a gastroenterologist can be reached at any hour by calling [phone].   DIET:  We do recommend a small meal at first, but then you may proceed to your regular diet.  Drink plenty of fluids but you should avoid alcoholic beverages for 24 hours.  ACTIVITY:  You should  plan to take it easy for the rest of today and you should NOT DRIVE or use heavy machinery until tomorrow (because of the sedation medicines used during the test).    FOLLOW UP: Our staff will call the number listed on your records the next business day following your procedure to check on you and address any questions or concerns that you may have regarding the information given to you following your procedure. If we do not reach you, we will leave a message.  However, if you are feeling well and you are not experiencing any problems, there is no need to return our call.  We will assume that you have returned to your regular daily activities without incident.  If any biopsies were taken you will be contacted by phone or by letter within the next 1-3 weeks.  Please call us at [phone] if you have not heard about the biopsies in 3 weeks.    SIGNATURES/CONFIDENTIALITY: You and/or your care partner have signed paperwork which will be entered into your electronic medical record.  These signatures attest to the fact that that the information above on your After Visit Summary has been reviewed and is understood.  Full responsibility of the confidentiality of this discharge information lies with you and/or your care-partner.

## 2016-10-20 NOTE — Op Note (Signed)
[facility] Patient Name: Charles Daniel Procedure Date: 10/20/2016 8:55 AM MRN: [id number] Endoscopist: Mallie Mussel L. Loletha Carrow , MD Age: 76 Referring MD:  Date of Birth: February 05, 1941 Gender: Male Account #: [id number] Procedure:                Colonoscopy Indications:              Surveillance: Personal history of adenomatous                            polyps on last colonoscopy 5 years ago Medicines:                Monitored Anesthesia Care Procedure:                Pre-Anesthesia Assessment:                           - Prior to the procedure, a History and Physical                            was performed, and patient medications and                            allergies were reviewed. The patient's tolerance of                            previous anesthesia was also reviewed. The risks                            and benefits of the procedure and the sedation                            options and risks were discussed with the patient.                            All questions were answered, and informed consent                            was obtained. Prior Anticoagulants: The patient has                            taken no previous anticoagulant or antiplatelet                            agents. ASA Grade Assessment: III - A patient with                            severe systemic disease. After reviewing the risks                            and benefits, the patient was deemed in                            satisfactory condition to undergo the procedure.  After obtaining informed consent, the colonoscope                            was passed under direct vision. Throughout the                            procedure, the patient's blood pressure, pulse, and                            oxygen saturations were monitored continuously. The                            Colonoscope was introduced through the anus and                            advanced to the the cecum,  identified by                            appendiceal orifice and ileocecal valve. The                            colonoscopy was performed without difficulty. The                            patient tolerated the procedure well. The quality                            of the bowel preparation was good. The ileocecal                            valve, appendiceal orifice, and rectum were                            photographed. The quality of the bowel preparation                            was evaluated using the BBPS [facility] Bowel                            Preparation Scale) with scores of: Right Colon = 2,                            Transverse Colon = 2 and Left Colon = 2. The total                            BBPS score equals 6. The bowel preparation used was                            SUPREP. Scope In: 9:16:27 AM Scope Out: 9:40:58 AM Scope Withdrawal Time: 0 hours 21 minutes 9 seconds  Total Procedure Duration: 0 hours 24 minutes 31 seconds  Findings:                 The perianal and digital rectal  examinations were                            normal.                           Six sessile polyps were found in the descending                            colon (2), transverse colon (1), ascending colon                            (2) and cecum (1). The polyps were 2 to 6 mm in                            size. These polyps were removed with a cold snare.                            Resection and retrieval were complete.                           A 12 mm polyp was found in the proximal descending                            colon. The polyp was sessile. The polyp was removed                            with a hot snare. Resection and retrieval were                            complete.                           Multiple diverticula were found in the left colon                            and right colon.                           There was evidence of a prior end-to-end                             colo-colonic anastomosis in the rectum. This was                            patent and was characterized by healthy appearing                            mucosa.                           The exam was otherwise without abnormality on                            direct and retroflexion views. Complications:  No immediate complications. Estimated Blood Loss:     Estimated blood loss: none. Impression:               - Six 2 to 6 mm polyps in the descending colon, in                            the transverse colon, in the ascending colon and in                            the cecum, removed with a cold snare. Resected and                            retrieved.                           - One 12 mm polyp in the proximal descending colon,                            removed with a hot snare. Resected and retrieved.                           - Diverticulosis in the left colon and in the right                            colon.                           - Patent end-to-end colo-colonic anastomosis,                            characterized by healthy appearing mucosa.                           - The examination was otherwise normal on direct                            and retroflexion views. Recommendation:           - Patient has a contact number available for                            emergencies. The signs and symptoms of potential                            delayed complications were discussed with the                            patient. Return to normal activities tomorrow.                            Written discharge instructions were provided to the                            patient.                           -  Resume previous diet.                           - Continue present medications.                           - No aspirin, ibuprofen, naproxen, or other                            non-steroidal anti-inflammatory drugs for 7 days                            after polyp removal.                            - Await pathology results.                           - No repeat colonoscopy due to age. Henry L. Loletha Carrow, MD 10/20/2016 9:47:19 AM This report has been signed electronically.

## 2016-10-20 NOTE — Progress Notes (Signed)
Pt's states no medical or surgical changes since previsit or office visit. 

## 2016-10-21 ENCOUNTER — Telehealth: Payer: Self-pay | Admitting: *Deleted

## 2016-10-21 ENCOUNTER — Encounter: Payer: Self-pay | Admitting: Family Medicine

## 2016-10-21 NOTE — Telephone Encounter (Signed)
  Follow up Call-  Call back number 10/20/2016  Post procedure Call Back phone  # [phone]  Permission to leave phone message Yes  Some recent data might be hidden     Patient questions:  Do you have a fever, pain , or abdominal swelling? No. Pain Score  0 *  Have you tolerated food without any problems? Yes.    Have you been able to return to your normal activities? Yes.    Do you have any questions about your discharge instructions: Diet   No. Medications  No. Follow up visit  No.  Do you have questions or concerns about your Care? No.  Actions: * If pain score is 4 or above: No action needed, pain <4.

## 2016-10-22 MED FILL — GENVOYA TABLET: 150-150-200 | 30 days supply | Qty: 30 | Fill #4

## 2016-10-23 ENCOUNTER — Encounter: Payer: Self-pay | Admitting: Gastroenterology

## 2016-10-25 ENCOUNTER — Encounter: Payer: Self-pay | Admitting: Family Medicine

## 2016-11-10 ENCOUNTER — Telehealth: Payer: Self-pay | Admitting: Family Medicine

## 2016-11-10 NOTE — Telephone Encounter (Signed)
Pt  Has appt with Dr Deborra Medina on 11/11/16 at 9 AM.

## 2016-11-10 NOTE — Telephone Encounter (Signed)
Patient Name: Charles Daniel  DOB: 01/05/1941    Initial Comment Caller states that he is needing to make an appt. He is having shortness of breath and trouble breathing.    Nurse Assessment  Nurse: Evangeline Gula, RN, Jenny Reichmann Date/Time (Eastern Time): 11/10/2016 3:45:33 PM  Confirm and document reason for call. If symptomatic, describe symptoms. ---Caller states that he is needing to make an appt. He is having shortness of breath and trouble breathing. He states he can't breath thru his nose and he has chest congestion. This started on Sunday.  Does the patient have any new or worsening symptoms? ---Yes  Will a triage be completed? ---Yes  Related visit to physician within the last 2 weeks? ---No  Does the PT have any chronic conditions? (i.e. diabetes, asthma, etc.) ---No  Is this a behavioral health or substance abuse call? ---No     Guidelines    Guideline Title Affirmed Question Affirmed Notes  Cough - Acute Productive [1] Continuous (nonstop) coughing interferes with work or school AND [2] no improvement using cough treatment per Care Advice    Final Disposition User   See Physician within 24 Hours [place], RN, [place]    Comments  Report received from [facility]. Returned call to pt and appt made for 11/11/16 at 0900 with Dr Marjory Lies. Pt aware/agreeable.   Referrals  REFERRED TO PCP OFFICE   Disagree/Comply: Comply

## 2016-11-11 ENCOUNTER — Encounter: Payer: Self-pay | Admitting: Family Medicine

## 2016-11-11 ENCOUNTER — Ambulatory Visit (INDEPENDENT_AMBULATORY_CARE_PROVIDER_SITE_OTHER): Payer: Medicare Other | Admitting: Family Medicine

## 2016-11-11 VITALS — BP 120/80 | HR 78 | Temp 98.3°F | Ht 66.0 in | Wt 140.0 lb

## 2016-11-11 DIAGNOSIS — J441 Chronic obstructive pulmonary disease with (acute) exacerbation: Secondary | ICD-10-CM | POA: Diagnosis not present

## 2016-11-11 MED ORDER — ALBUTEROL SULFATE HFA 108 (90 BASE) MCG/ACT IN AERS: 2.0000 | INHALATION_SPRAY | Freq: Four times a day (QID) | RESPIRATORY_TRACT | 2 refills | Status: DC | PRN

## 2016-11-11 MED ORDER — DOXYCYCLINE HYCLATE 100 MG PO TABS: 100.0000 mg | ORAL_TABLET | Freq: Two times a day (BID) | ORAL | 0 refills | Status: DC

## 2016-11-11 MED ORDER — IPRATROPIUM BROMIDE 0.02 % IN SOLN
0.5000 mg | Freq: Once | RESPIRATORY_TRACT | Status: AC
  Administered 2016-11-11: 0.5 mg via RESPIRATORY_TRACT

## 2016-11-11 MED ORDER — FLUTICASONE FUROATE-VILANTEROL 100-25 MCG/INH IN AEPB: 1.0000 | INHALATION_SPRAY | Freq: Every day | RESPIRATORY_TRACT | 11 refills | Status: DC

## 2016-11-11 MED ORDER — ALBUTEROL SULFATE (2.5 MG/3ML) 0.083% IN NEBU
2.5000 mg | INHALATION_SOLUTION | Freq: Once | RESPIRATORY_TRACT | Status: AC
  Administered 2016-11-11: 2.5 mg via RESPIRATORY_TRACT

## 2016-11-11 MED ORDER — IPRATROPIUM-ALBUTEROL 0.5-2.5 (3) MG/3ML IN SOLN: 3.0000 mL | Freq: Once | RESPIRATORY_TRACT | Status: DC

## 2016-11-11 NOTE — Assessment & Plan Note (Signed)
Restart Breo- eRx sent. Also sent refills for albuterol. Will start on doxycycline as well given progression and worsening of sputum production. Call or return to clinic prn if these symptoms worsen or fail to improve as anticipated. The patient indicates understanding of these issues and agrees with the plan.

## 2016-11-11 NOTE — Patient Instructions (Signed)
Great to see you.  Please take antibiotic as directed- doxycyline 1 tablet twice daily x 7 days. Restart Breo, proair as needed for wheezing and shortness of breath.

## 2016-11-11 NOTE — Progress Notes (Signed)
Subjective:   Patient ID: Charles Daniel, male    DOB: [birth year], 76 y.o.   MRN: [id number]  Charles Daniel is a pleasant 76 y.o. year old male patient of Dr. Darnell Level, new to me, who presents to clinic today with Shortness of Breath and Wheezing  on 11/11/2016  HPI:  3 days of nasal congestion, productive cough, SOB. He has not noticed much improvement with OTC rxs- mucinex and robitussin. Chart reviewed- h/o emphysema- ex smoker (quit in 2010). Was using Breo and proair as needed but ran out months ago.  Last CXR was 03/21/16- reviewed. Neg.  Current Outpatient Prescriptions on File Prior to Visit  Medication Sig Dispense Refill  . GENVOYA 150-150-200-10 MG TABS tablet TAKE 1 TABLET BY MOUTH DAILY WITH BREAKFAST 30 tablet 11  . hydrochlorothiazide (HYDRODIURIL) 25 MG tablet Take 1 tablet (25 mg total) by mouth daily.    . potassium chloride (K-DUR) 10 MEQ tablet Take 1 tablet (10 mEq total) by mouth every Monday, Wednesday, and Friday. 30 tablet 11   Current Facility-Administered Medications on File Prior to Visit  Medication Dose Route Frequency Provider Last Rate Last Dose  . 0.9 %  sodium chloride infusion  500 mL Intravenous Continuous [name], Kirke Corin, MD        No Known Allergies  Past Medical History:  Diagnosis Date  . Bilateral hydrocele 2012  . Depression   . Diverticulosis 2013   by colonoscopy  . Emphysema lung ([place]) 03/2014    by CXR, remote smoking history  . History of colon cancer    2007 --  S/P RECTOSIGMOID COLECTOMY--  NO CHEMORADIATION--  NO RECURRENCE  . History of CVA (cerebrovascular accident)    2003-  RIGHT MIDDLE CVA---   NO RESIDUAL  . History of hepatitis B    REMOTE AND INACTIVE  PER DOCUMENTATION  . History of syphilis    SECONDARY SYPHILITIS TX'D IN 1998  PER DOCUMENTATION  . History of tuberculosis    LATENT TB  TX'D X12  MONTHS IN 1995  . HIV infection ([place])    [place] (DR CAMPBELL)  . Hypertension   .  Prostate carcinoma [facility]) dx 09/2012   T1c, brachytherapy/seed implant Karsten Ro, Tammi Klippel)    Past Surgical History:  Procedure Laterality Date  . COLONOSCOPY  08/2011   3 polyps, diverticulosis, rec rpt 5 yrs [name])  . COLONOSCOPY  10/2016   7 TAs, diverticulosis, no rpt recommended ([name])  . INGUINAL HERNIA REPAIR  1994   UNILATERAL  . LOW ANTERIOR RESECTION RECTOSIGMOID COLON  03-09-2006  . PROSTATE BIOPSY  09/2012   53cc  (MD OFFICE)  . RADIOACTIVE SEED IMPLANT N/A 01/20/2013   Procedure: RADIOACTIVE SEED IMPLANT;  Surgeon: Claybon Jabs, MD;  as well as Milana Kidney MD  . TRANSTHORACIC ECHOCARDIOGRAM  11-16-2001   LV WALL THICKNESS MODERATELY INCREASED/  EF 55-65%/ LVSF NORMAL    Family History  Problem Relation Age of Onset  . Cancer Sister        breast  . Breast cancer Sister   . Cancer Brother 65       prostate, treated with seed implant  . Cancer Brother        prostate  . Cancer Brother        prostate  . Cancer Daughter        breast  . Cancer Other        prostate  . Cancer Father  unsure  . CAD Neg Hx   . Stroke Neg Hx   . Diabetes Neg Hx     Social History   Social History  . Marital status: Married    Spouse name: N/A  . Number of children: N/A  . Years of education: N/A   Occupational History  . Not on file.   Social History Main Topics  . Smoking status: Former Smoker    Packs/day: 0.30    Years: 35.00    Types: Cigarettes    Quit date: 03/04/2009  . Smokeless tobacco: Never Used  . Alcohol use No  . Drug use: No  . Sexual activity: Not on file     Comment: declined condoms   Other Topics Concern  . Not on file   Social History Narrative   Does not want ID status disclosed to family   Caffeine: 3 sodas/day   Lives with wife.  2 grown sons.   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    Review of Systems  Constitutional: Negative for fever.  HENT: Positive for congestion, postnasal drip, rhinorrhea and sore throat.   Respiratory: Positive for cough, shortness of breath and wheezing.   Cardiovascular: Negative.   Gastrointestinal: Negative.   Endocrine: Negative.   Genitourinary: Negative.   Musculoskeletal: Negative.   Allergic/Immunologic: Negative.   Neurological: Negative.   Hematological: Negative.   Psychiatric/Behavioral: Negative.   All other systems reviewed and are negative.      Objective:    BP 120/80   Pulse 78   Temp 98.3 F (36.8 C)   Ht 5\' 6"  (1.676 m)   Wt 140 lb (63.5 kg)   SpO2 96%   BMI 22.60 kg/m    Physical Exam  Constitutional: He is oriented to person, place, and time. He appears well-developed and well-nourished. No distress.  HENT:  Head: Normocephalic and atraumatic.  Eyes: Conjunctivae are normal.  Cardiovascular: Normal rate, regular rhythm and normal heart sounds.   Pulmonary/Chest:  Audile wheezing from across the room- lungs tigh on exam, duonebs given in office. Improved lung sounds after nebs but remains tight with bilateral exp wheezes and faint rhonchi   Musculoskeletal: Normal range of motion. He exhibits no edema.  Neurological: He is alert and oriented to person, place, and time. No cranial nerve deficit.  Skin: Skin is warm and dry. He is not diaphoretic.  Psychiatric: He has a normal mood and affect. His behavior is normal. Judgment and thought content normal.  Nursing note and vitals reviewed.         Assessment & Plan:   COPD exacerbation ([place]) No Follow-up on file.

## 2016-11-11 NOTE — Addendum Note (Signed)
Addended by: Mady Haagensen on: 11/11/2016 03:38 PM   Modules accepted: Orders

## 2016-11-20 MED FILL — GENVOYA TABLET: 150-150-200 | 30 days supply | Qty: 30 | Fill #5

## 2016-11-26 ENCOUNTER — Other Ambulatory Visit: Payer: Medicare Other

## 2016-11-26 DIAGNOSIS — B20 Human immunodeficiency virus [HIV] disease: Secondary | ICD-10-CM | POA: Diagnosis not present

## 2016-11-26 LAB — COMPREHENSIVE METABOLIC PANEL
ALBUMIN: 3.5 g/dL — AB (ref 3.6–5.1)
ALT: 21 U/L (ref 9–46)
AST: 16 U/L (ref 10–35)
Alkaline Phosphatase: 58 U/L (ref 40–115)
BILIRUBIN TOTAL: 0.5 mg/dL (ref 0.2–1.2)
BUN: 11 mg/dL (ref 7–25)
CHLORIDE: 98 mmol/L (ref 98–110)
CO2: 25 mmol/L (ref 20–31)
CREATININE: 1.03 mg/dL (ref 0.70–1.18)
Calcium: 8.6 mg/dL (ref 8.6–10.3)
GLUCOSE: 110 mg/dL — AB (ref 65–99)
Potassium: 3 mmol/L — ABNORMAL LOW (ref 3.5–5.3)
SODIUM: 137 mmol/L (ref 135–146)
Total Protein: 6.5 g/dL (ref 6.1–8.1)

## 2016-11-26 LAB — CBC
HCT: 42.4 % (ref 38.5–50.0)
HEMOGLOBIN: 13.7 g/dL (ref 13.2–17.1)
MCH: 27.9 pg (ref 27.0–33.0)
MCHC: 32.3 g/dL (ref 32.0–36.0)
MCV: 86.4 fL (ref 80.0–100.0)
MPV: 10.3 fL (ref 7.5–12.5)
PLATELETS: 265 10*3/uL (ref 140–400)
RBC: 4.91 MIL/uL (ref 4.20–5.80)
RDW: 15.1 % — ABNORMAL HIGH (ref 11.0–15.0)
WBC: 13.2 10*3/uL — ABNORMAL HIGH (ref 3.8–10.8)

## 2016-11-27 LAB — RPR

## 2016-11-27 LAB — T-HELPER CELL (CD4) - (RCID CLINIC ONLY)
CD4 % Helper T Cell: 28 % — ABNORMAL LOW (ref 33–55)
CD4 T Cell Abs: 770 /uL (ref 400–2700)

## 2016-12-01 LAB — HIV-1 RNA QUANT-NO REFLEX-BLD
HIV 1 RNA QUANT: NOT DETECTED {copies}/mL
HIV-1 RNA QUANT, LOG: NOT DETECTED {Log_copies}/mL

## 2016-12-10 ENCOUNTER — Encounter: Payer: Self-pay | Admitting: Internal Medicine

## 2016-12-10 ENCOUNTER — Ambulatory Visit (INDEPENDENT_AMBULATORY_CARE_PROVIDER_SITE_OTHER): Payer: Medicare Other | Admitting: Internal Medicine

## 2016-12-10 DIAGNOSIS — B20 Human immunodeficiency virus [HIV] disease: Secondary | ICD-10-CM | POA: Diagnosis present

## 2016-12-10 NOTE — Progress Notes (Signed)
Patient Active Problem List   Diagnosis Date Noted  . History of rectal cancer 04/22/2010    Priority: High  . Human immunodeficiency virus (HIV) disease ([place]) 05/15/2006    Priority: High  . Essential hypertension 05/15/2006    Priority: High  . CEREBROVASCULAR ACCIDENT 05/15/2006    Priority: High  . Hypokalemia 03/20/2016  . COPD exacerbation ([place]) 10/18/2015  . 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 Advanced care planning/counseling discussion 06/26/2014  . 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 Prostate CA ([place]) 11/08/2012  . Medicare annual wellness visit, subsequent 08/02/2012  . 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 WEIGHT LOSS 12/18/2008  . CONSTIPATION 03/16/2008  . TUBERCULOSIS 05/15/2006  . SYPHILIS 05/15/2006  . 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  . Ex-smoker 05/15/2006  . HEPATITIS B, HX OF 05/15/2006  . GASTROINTESTINAL HEMORRHAGE, HX OF 05/15/2006    Patient's Medications  New Prescriptions   No medications on file  Previous Medications   ALBUTEROL (PROVENTIL HFA;VENTOLIN HFA) 108 (90 BASE) MCG/ACT INHALER    Inhale 2 puffs into the lungs every 6 (six) hours as needed for wheezing or shortness of breath.   DOXYCYCLINE (VIBRA-TABS) 100 MG TABLET    Take 1 tablet (100 mg total) by mouth 2 (two) times daily.   FLUTICASONE FUROATE-VILANTEROL (BREO ELLIPTA) 100-25 MCG/INH AEPB    Inhale 1 puff into the lungs daily.   GENVOYA 150-150-200-10 MG TABS TABLET    TAKE 1 TABLET BY MOUTH DAILY WITH BREAKFAST   HYDROCHLOROTHIAZIDE (HYDRODIURIL) 25 MG TABLET    Take 1 tablet (25 mg total) by mouth daily.   POTASSIUM CHLORIDE (K-DUR) 10 MEQ TABLET    Take 1 tablet (10 mEq total) by mouth every Monday, Wednesday, and Friday.  Modified Medications   No medications on file  Discontinued Medications   No medications on file    Subjective: Bryse is in for his routine HIV follow-up visit. He has had no problems obtaining, taking or  tolerating his Genvoya and has not missed any doses. He is feeling well. He has no health concerns other than the fact that he has ongoing problems with erectile dysfunction. He has taken sildenafil in the past with improvement but says he does not want to try that now because of the expense. He will be traveling to [place] and [place] soon to visit his 2 sons.   Review of Systems: Review of Systems  Constitutional: Negative for chills, diaphoresis, fever, malaise/fatigue and weight loss.  HENT: Negative for sore throat.   Respiratory: Negative for cough, sputum production and shortness of breath.   Cardiovascular: Negative for chest pain.  Gastrointestinal: Negative for abdominal pain, diarrhea, heartburn, nausea and vomiting.  Genitourinary: Negative for dysuria and frequency.  Musculoskeletal: Negative for joint pain and myalgias.  Skin: Negative for rash.  Neurological: Negative for dizziness and headaches.  Psychiatric/Behavioral: Negative for depression and substance abuse. The patient is not nervous/anxious.     Past Medical History:  Diagnosis Date  . Bilateral hydrocele 2012  . Depression   . Diverticulosis 2013   by colonoscopy  . Emphysema lung ([place]) 03/2014    by CXR, remote smoking history  . History of colon cancer    2007 --  S/P RECTOSIGMOID COLECTOMY--  NO CHEMORADIATION--  NO RECURRENCE  . History of CVA (cerebrovascular accident)    2003-  RIGHT MIDDLE CVA---   NO RESIDUAL  . History of hepatitis B  REMOTE AND INACTIVE  PER DOCUMENTATION  . History of syphilis    SECONDARY SYPHILITIS TX'D IN 1998  PER DOCUMENTATION  . History of tuberculosis    LATENT TB  TX'D X12  MONTHS IN 1995  . HIV infection ([place])    [place] (DR Brieanna Nau)  . Hypertension   . Prostate carcinoma [facility]) dx 09/2012   T1c, brachytherapy/seed implant Karsten Ro, Tammi Klipp[place])    Social History  Substance Use Topics  . Smoking status: Former Smoker     Packs/day: 0.30    Years: 35.00    Types: Cigarettes    Quit date: 03/04/2009  . Smokeless tobacco: Never Used  . Alcohol use No    Family History  Problem Relation Age of Onset  . Cancer Sister        breast  . Breast cancer Sister   . Cancer Brother 12       prostate, treated with seed implant  . Cancer Brother        prostate  . Cancer Brother        prostate  . Cancer Daughter        breast  . Cancer Other        prostate  . Cancer Father        unsure  . CAD Neg Hx   . Stroke Neg Hx   . Diabetes Neg Hx     No Known Allergies  Objective:  Vitals:   12/10/16 1342  BP: (!) 162/94  Pulse: 86  Temp: 98.3 F (36.8 C)  TempSrc: Oral  Weight: 139 lb (63 kg)   Body mass index is 22.44 kg/m.  Physical Exam  Constitutional: He is oriented to person, place, and time.  He is in good spirits.  HENT:  Mouth/Throat: No oropharyngeal exudate.  Eyes: Conjunctivae are normal.  Cardiovascular: Normal rate and regular rhythm.   No murmur heard. Pulmonary/Chest: Effort normal and breath sounds normal. He has no wheezes. He has no rales.  Abdominal: Soft. He exhibits no mass. There is no tenderness.  Musculoskeletal: Normal range of motion.  Neurological: He is alert and oriented to person, place, and time.  Skin: No rash noted.  Psychiatric: Mood and affect normal.    Lab Results Lab Results  Component Value Date   WBC 13.2 (H) 11/26/2016   HGB 13.7 11/26/2016   HCT 42.4 11/26/2016   MCV 86.4 11/26/2016   PLT 265 11/26/2016    Lab Results  Component Value Date   CREATININE 1.03 11/26/2016   BUN 11 11/26/2016   NA 137 11/26/2016   K 3.0 (L) 11/26/2016   CL 98 11/26/2016   CO2 25 11/26/2016    Lab Results  Component Value Date   ALT 21 11/26/2016   AST 16 11/26/2016   ALKPHOS 58 11/26/2016   BILITOT 0.5 11/26/2016    Lab Results  Component Value Date   CHOL 172 07/03/2016   HDL 37.80 (L) 07/03/2016   LDLCALC 108 (H) 07/03/2016   TRIG 133.0  07/03/2016   CHOLHDL 5 07/03/2016   Lab Results  Component Value Date   LABRPR NON REAC 11/26/2016   RPRTITER 1:1 11/22/2015   HIV 1 RNA Quant (copies/mL)  Date Value  11/26/2016 <20 NOT DETECTED  11/22/2015 <20  05/16/2015 <20   CD4 T Cell Abs (/uL)  Date Value  11/26/2016 770  11/22/2015 760  05/16/2015 990     Problem List Items Addressed This Visit  High   Human immunodeficiency virus (HIV) disease ([place])    His infection remains under excellent, long-term control. He will continue Genvoya in follow-up after lab work in one year.      Relevant Orders   CBC   T-helper cell (CD4)- ([facility] clinic only)   Comprehensive metabolic panel   RPR   HIV 1 RNA quant-no reflex-bld        Michel Bickers, MD [facility] [place] [phone] pager   [phone] cell 12/10/2016, 2:11 PM

## 2016-12-10 NOTE — Assessment & Plan Note (Signed)
His infection remains under excellent, long-term control. He will continue Genvoya in follow-up after lab work in one year.

## 2016-12-15 DIAGNOSIS — N401 Enlarged prostate with lower urinary tract symptoms: Secondary | ICD-10-CM | POA: Diagnosis not present

## 2016-12-21 MED FILL — GENVOYA TABLET: 150-150-200 | 30 days supply | Qty: 30 | Fill #6

## 2017-01-27 ENCOUNTER — Other Ambulatory Visit: Payer: Self-pay | Admitting: *Deleted

## 2017-01-27 DIAGNOSIS — B20 Human immunodeficiency virus [HIV] disease: Secondary | ICD-10-CM

## 2017-01-27 MED ORDER — ELVITEG-COBIC-EMTRICIT-TENOFAF 150-150-200-10 MG PO TABS: 1.0000 | ORAL_TABLET | Freq: Every day | ORAL | 11 refills | Status: DC

## 2017-01-28 MED FILL — GENVOYA TABLET: 150-150-200 | 30 days supply | Qty: 30 | Fill #0

## 2017-02-22 MED FILL — GENVOYA TABLET: 150-150-200 | 30 days supply | Qty: 30 | Fill #1

## 2017-02-26 DIAGNOSIS — C61 Malignant neoplasm of prostate: Secondary | ICD-10-CM | POA: Diagnosis not present

## 2017-03-17 MED FILL — GENVOYA TABLET: 150-150-200 | 30 days supply | Qty: 30 | Fill #2

## 2017-04-14 MED FILL — GENVOYA TABLET: 150-150-200 | 30 days supply | Qty: 30 | Fill #3

## 2017-05-10 MED FILL — GENVOYA TABLET: 150-150-200 | 30 days supply | Qty: 30 | Fill #0

## 2017-06-03 MED FILL — GENVOYA TABLET: 150-150-200 | 30 days supply | Qty: 30 | Fill #1

## 2017-06-07 ENCOUNTER — Telehealth: Payer: Self-pay | Admitting: Family Medicine

## 2017-06-07 NOTE — Telephone Encounter (Signed)
Called pt to schedule AWV. Please schedule pt at convienence after 4/12, and ask for [place] [phone]

## 2017-06-10 ENCOUNTER — Ambulatory Visit (INDEPENDENT_AMBULATORY_CARE_PROVIDER_SITE_OTHER): Payer: Medicare Other | Admitting: Internal Medicine

## 2017-06-10 ENCOUNTER — Encounter: Payer: Self-pay | Admitting: Internal Medicine

## 2017-06-10 VITALS — BP 136/78 | HR 88 | Temp 98.6°F | Wt 146.0 lb

## 2017-06-10 DIAGNOSIS — J00 Acute nasopharyngitis [common cold]: Secondary | ICD-10-CM

## 2017-06-10 MED ORDER — METHYLPREDNISOLONE ACETATE 80 MG/ML IJ SUSP
80.0000 mg | Freq: Once | INTRAMUSCULAR | Status: AC
  Administered 2017-06-10: 80 mg via INTRAMUSCULAR

## 2017-06-10 MED ORDER — AZITHROMYCIN 250 MG PO TABS: ORAL_TABLET | ORAL | 0 refills | Status: DC

## 2017-06-10 NOTE — Patient Instructions (Signed)
Chronic Obstructive Pulmonary Disease Exacerbation  Chronic obstructive pulmonary disease (COPD) is a common lung problem. In COPD, the flow of air from the lungs is limited. COPD exacerbations are times that breathing gets worse and you need extra treatment. Without treatment they can be life threatening. If they happen often, your lungs can become more damaged. If your COPD gets worse, your doctor may treat you with:  ? Medicines.  ? Oxygen.  ? Different ways to clear your airway, such as using a mask.    Follow these instructions at home:  ? Do not smoke.  ? Avoid tobacco smoke and other things that bother your lungs.  ? If given, take your antibiotic medicine as told. Finish the medicine even if you start to feel better.  ? Only take medicines as told by your doctor.  ? Drink enough fluids to keep your pee (urine) clear or pale yellow (unless your doctor has told you not to).  ? Use a cool mist machine (vaporizer).  ? If you use oxygen or a machine that turns liquid medicine into a mist (nebulizer), continue to use them as told.  ? Keep up with shots (vaccinations) as told by your doctor.  ? Exercise regularly.  ? Eat healthy foods.  ? Keep all doctor visits as told.  Get help right away if:  ? You are very short of breath and it gets worse.  ? You have trouble talking.  ? You have bad chest pain.  ? You have blood in your spit (sputum).  ? You have a fever.  ? You keep throwing up (vomiting).  ? You feel weak, or you pass out (faint).  ? You feel confused.  ? You keep getting worse.  This information is not intended to replace advice given to you by your health care provider. Make sure you discuss any questions you have with your health care provider.  Document Released: 04/09/2011 Document Revised: 09/26/2015 Document Reviewed: 12/23/2012  Elsevier Interactive Patient Education ? 2017 Elsevier Inc.

## 2017-06-10 NOTE — Addendum Note (Signed)
Addended by: Lurlean Nanny on: 06/10/2017 12:16 PM   Modules accepted: Orders

## 2017-06-10 NOTE — Progress Notes (Signed)
HPI  Pt presents to the clinic today with c/o runny nose, cough and chest congestion. He reports this started 1 week ago. He is blowing clear mucous out of his nose. The cough is nonproductive. He denies wheezing but has had some shortness of breath with exertion. He denies chest pain. He denies fever, chills or body aches. He has tried Mucinex with minimal relief. He has a history of COPD, no longer smokes. He has not had sick contacts.  Review of Systems      Past Medical History:  Diagnosis Date  . Bilateral hydrocele 2012  . Depression   . Diverticulosis 2013   by colonoscopy  . Emphysema lung ([place]) 03/2014    by CXR, remote smoking history  . History of colon cancer    2007 --  S/P RECTOSIGMOID COLECTOMY--  NO CHEMORADIATION--  NO RECURRENCE  . History of CVA (cerebrovascular accident)    2003-  RIGHT MIDDLE CVA---   NO RESIDUAL  . History of hepatitis B    REMOTE AND INACTIVE  PER DOCUMENTATION  . History of syphilis    SECONDARY SYPHILITIS TX'D IN 1998  PER DOCUMENTATION  . History of tuberculosis    LATENT TB  TX'D X12  MONTHS IN 1995  . HIV infection ([place])    [place] (DR CAMPBELL)  . Hypertension   . Prostate carcinoma [facility]) dx 09/2012   T1c, brachytherapy/seed implant Karsten Ro, [place])    Family History  Problem Relation Age of Onset  . Cancer Sister        breast  . Breast cancer Sister   . Cancer Brother 67       prostate, treated with seed implant  . Cancer Brother        prostate  . Cancer Brother        prostate  . Cancer Daughter        breast  . Cancer Other        prostate  . Cancer Father        unsure  . CAD Neg Hx   . Stroke Neg Hx   . Diabetes Neg Hx     Social History   Socioeconomic History  . Marital status: Married    Spouse name: Not on file  . Number of children: Not on file  . Years of education: Not on file  . Highest education level: Not on file  Social Needs  . Financial resource strain:  Not on file  . Food insecurity - worry: Not on file  . Food insecurity - inability: Not on file  . Transportation needs - medical: Not on file  . Transportation needs - non-medical: Not on file  Occupational History  . Not on file  Tobacco Use  . Smoking status: Former Smoker    Packs/day: 0.30    Years: 35.00    Pack years: 10.50    Types: Cigarettes    Last attempt to quit: 03/04/2009    Years since quitting: 8.2  . Smokeless tobacco: Never Used  Substance and Sexual Activity  . Alcohol use: No    Alcohol/week: 0.0 oz  . Drug use: No  . Sexual activity: Not on file    Comment: declined condoms  Other Topics Concern  . Not on file  Social History Narrative   Does not want ID status disclosed to family   Caffeine: 3 sodas/day   Lives with wife.  2 grown sons.   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  HEENT:  Positive runny nose. Denies eye redness, eye pain, pressure behind the eyes, facial pain, nasal congestion, ear pain, ringing in the ears, wax buildup, or sore throat. Respiratory: Positive cough, shortness of breath. Denies difficulty breathing.  Cardiovascular: Denies chest pain, chest tightness, palpitations or swelling in the hands or feet.   No other specific complaints in a complete review of systems (except as listed in HPI above).  Objective:   BP 136/78   Pulse 88   Temp 98.6 F (37 C) (Oral)   Wt 146 lb (66.2 kg)   SpO2 96%   BMI 23.57 kg/m  Wt Readings from Last 3 Encounters:  06/10/17 146 lb (66.2 kg)  12/10/16 139 lb (63 kg)  11/11/16 140 lb (63.5 kg)     General: Appears his stated age, chronically ill appearing, in NAD. HEENT: Head: normal shape and size;  Throat/Mouth: + PND. Teeth present, mucosa erythematous and moist, no exudate noted, no lesions or ulcerations noted.  Neck: No cervical lymphadenopathy.  Cardiovascular: Normal rate and rhythm.  Pulmonary/Chest: Normal effort and with intermittent expiratory wheezing noted. No respiratory distress. No rales or ronchi noted.       Assessment & Plan:   Upper Respiratory Infection:  Get some rest and drink plenty of water Start Zyrtec 10 mg daily eRx for Azithromax x 5 days Delsym as needed for cough  RTC as needed or if symptoms persist.   Webb Silversmith, NP

## 2017-06-18 ENCOUNTER — Other Ambulatory Visit: Payer: Self-pay | Admitting: Pharmacist

## 2017-06-29 MED FILL — GENVOYA TABLET: 150-150-200 | 30 days supply | Qty: 30 | Fill #2

## 2017-07-22 MED FILL — GENVOYA TABLET: 150-150-200 | 30 days supply | Qty: 30 | Fill #3

## 2017-08-10 DIAGNOSIS — C61 Malignant neoplasm of prostate: Secondary | ICD-10-CM | POA: Diagnosis not present

## 2017-08-17 DIAGNOSIS — R3121 Asymptomatic microscopic hematuria: Secondary | ICD-10-CM | POA: Diagnosis not present

## 2017-08-17 DIAGNOSIS — Z8546 Personal history of malignant neoplasm of prostate: Secondary | ICD-10-CM | POA: Diagnosis not present

## 2017-08-17 MED FILL — GENVOYA TABLET: 150-150-200 | 30 days supply | Qty: 30 | Fill #4

## 2017-09-08 ENCOUNTER — Other Ambulatory Visit: Payer: Self-pay | Admitting: Pharmacist Clinician (PhC)/ Clinical Pharmacy Specialist

## 2017-09-08 ENCOUNTER — Ambulatory Visit: Payer: Medicare Other

## 2017-09-13 MED FILL — GENVOYA TABLET: 150-150-200 | 30 days supply | Qty: 30 | Fill #5

## 2017-09-28 ENCOUNTER — Ambulatory Visit (INDEPENDENT_AMBULATORY_CARE_PROVIDER_SITE_OTHER): Payer: Medicare Other | Admitting: Family Medicine

## 2017-09-28 ENCOUNTER — Encounter: Payer: Self-pay | Admitting: Family Medicine

## 2017-09-28 VITALS — BP 130/82 | HR 82 | Temp 98.1°F | Ht 66.0 in | Wt 143.5 lb

## 2017-09-28 DIAGNOSIS — C61 Malignant neoplasm of prostate: Secondary | ICD-10-CM

## 2017-09-28 DIAGNOSIS — Z7189 Other specified counseling: Secondary | ICD-10-CM

## 2017-09-28 DIAGNOSIS — B20 Human immunodeficiency virus [HIV] disease: Secondary | ICD-10-CM

## 2017-09-28 DIAGNOSIS — Z87891 Personal history of nicotine dependence: Secondary | ICD-10-CM

## 2017-09-28 DIAGNOSIS — J439 Emphysema, unspecified: Secondary | ICD-10-CM | POA: Diagnosis not present

## 2017-09-28 DIAGNOSIS — I10 Essential (primary) hypertension: Secondary | ICD-10-CM | POA: Diagnosis not present

## 2017-09-28 DIAGNOSIS — Z0000 Encounter for general adult medical examination without abnormal findings: Secondary | ICD-10-CM

## 2017-09-28 MED ORDER — HYDROCHLOROTHIAZIDE 25 MG PO TABS: 25.0000 mg | ORAL_TABLET | Freq: Every day | ORAL | 3 refills | Status: DC

## 2017-09-28 NOTE — Assessment & Plan Note (Addendum)
Advanced directive discussion - doesn't have set up, declines packet. Unsure who would be HCPOA - rocky relationship with wife. Doesn't want prolonged life support if terminal condition. Ok for trial if he could improve. this will be ongoing discussion. Does not want family aware of his medical conditions.

## 2017-09-28 NOTE — Assessment & Plan Note (Signed)
Congratulated on ongoing abstinence.

## 2017-09-28 NOTE — Assessment & Plan Note (Signed)

## 2017-09-28 NOTE — Assessment & Plan Note (Signed)
Sees urology yearly [facility]) s/p brachytherapy 2017

## 2017-09-28 NOTE — ACP (Advance Care Planning) (Signed)
Advanced directive discussion - doesn't have set up, declines packet. Unsure who would be HCPOA - rocky relationship with wife. Doesn't want prolonged life support if terminal condition. Ok for trial if he could improve. Does not want family aware of his medical conditions.

## 2017-09-28 NOTE — Assessment & Plan Note (Signed)
Chronic, stable. Continue hctz. Has not been taking Kdur. Check labs today.

## 2017-09-28 NOTE — Patient Instructions (Addendum)
If interested, check with pharmacy about new 2 shot shingles series (shingrix).  Think about seeing local eye doctor for routine eye exam.  Think about who you would want to help make medical decisions in the case you can't.  Labs today.  Try tylenol for arthritis or topical aspercream gel. Return if not improving for further evaluation.   Health Maintenance, Male A healthy lifestyle and preventive care is important for your health and wellness. Ask your health care provider about what schedule of regular examinations is right for you. What should I know about weight and diet? Eat a Healthy Diet  Eat plenty of vegetables, fruits, whole grains, low-fat dairy products, and lean protein.  Do not eat a lot of foods high in solid fats, added sugars, or salt.  Maintain a Healthy Weight Regular exercise can help you achieve or maintain a healthy weight. You should:  Do at least 150 minutes of exercise each week. The exercise should increase your heart rate and make you sweat (moderate-intensity exercise).  Do strength-training exercises at least twice a week.  Watch Your Levels of Cholesterol and Blood Lipids  Have your blood tested for lipids and cholesterol every 5 years starting at 78 years of age. If you are at high risk for heart disease, you should start having your blood tested when you are 78 years old. You may need to have your cholesterol levels checked more often if: ? Your lipid or cholesterol levels are high. ? You are older than 78 years of age. ? You are at high risk for heart disease.  What should I know about cancer screening? Many types of cancers can be detected early and may often be prevented. Lung Cancer  You should be screened every year for lung cancer if: ? You are a current smoker who has smoked for at least 30 years. ? You are a former smoker who has quit within the past 15 years.  Talk to your health care provider about your screening options, when you should  start screening, and how often you should be screened.  Colorectal Cancer  Routine colorectal cancer screening usually begins at 78 years of age and should be repeated every 5-10 years until you are 78 years old. You may need to be screened more often if early forms of precancerous polyps or small growths are found. Your health care provider may recommend screening at an earlier age if you have risk factors for colon cancer.  Your health care provider may recommend using home test kits to check for hidden blood in the stool.  A small camera at the end of a tube can be used to examine your colon (sigmoidoscopy or colonoscopy). This checks for the earliest forms of colorectal cancer.  Prostate and Testicular Cancer  Depending on your age and overall health, your health care provider may do certain tests to screen for prostate and testicular cancer.  Talk to your health care provider about any symptoms or concerns you have about testicular or prostate cancer.  Skin Cancer  Check your skin from head to toe regularly.  Tell your health care provider about any new moles or changes in moles, especially if: ? There is a change in a mole's size, shape, or color. ? You have a mole that is larger than a pencil eraser.  Always use sunscreen. Apply sunscreen liberally and repeat throughout the day.  Protect yourself by wearing long sleeves, pants, a wide-brimmed hat, and sunglasses when outside.  What should  I know about heart disease, diabetes, and high blood pressure?  If you are 35-47 years of age, have your blood pressure checked every 3-5 years. If you are 46 years of age or older, have your blood pressure checked every year. You should have your blood pressure measured twice-once when you are at a hospital or clinic, and once when you are not at a hospital or clinic. Record the average of the two measurements. To check your blood pressure when you are not at a hospital or clinic, you can  use: ? An automated blood pressure machine at a pharmacy. ? A home blood pressure monitor.  Talk to your health care provider about your target blood pressure.  If you are between 52-6 years old, ask your health care provider if you should take aspirin to prevent heart disease.  Have regular diabetes screenings by checking your fasting blood sugar level. ? If you are at a normal weight and have a low risk for diabetes, have this test once every three years after the age of 57. ? If you are overweight and have a high risk for diabetes, consider being tested at a younger age or more often.  A one-time screening for abdominal aortic aneurysm (AAA) by ultrasound is recommended for men aged 67-75 years who are current or former smokers. What should I know about preventing infection? Hepatitis B If you have a higher risk for hepatitis B, you should be screened for this virus. Talk with your health care provider to find out if you are at risk for hepatitis B infection. Hepatitis C Blood testing is recommended for:  Everyone born from [birth year] through 1966.  Anyone with known risk factors for hepatitis C.  Sexually Transmitted Diseases (STDs)  You should be screened each year for STDs including gonorrhea and chlamydia if: ? You are sexually active and are younger than 78 years of age. ? You are older than 78 years of age and your health care provider tells you that you are at risk for this type of infection. ? Your sexual activity has changed since you were last screened and you are at an increased risk for chlamydia or gonorrhea. Ask your health care provider if you are at risk.  Talk with your health care provider about whether you are at high risk of being infected with HIV. Your health care provider may recommend a prescription medicine to help prevent HIV infection.  What else can I do?  Schedule regular health, dental, and eye exams.  Stay current with your vaccines  (immunizations).  Do not use any tobacco products, such as cigarettes, chewing tobacco, and e-cigarettes. If you need help quitting, ask your health care provider.  Limit alcohol intake to no more than 2 drinks per day. One drink equals 12 ounces of beer, 5 ounces of wine, or 1 ounces of hard liquor.  Do not use street drugs.  Do not share needles.  Ask your health care provider for help if you need support or information about quitting drugs.  Tell your health care provider if you often feel depressed.  Tell your health care provider if you have ever been abused or do not feel safe at home. This information is not intended to replace advice given to you by your health care provider. Make sure you discuss any questions you have with your health care provider. Document Released: 10/17/2007 Document Revised: 12/18/2015 Document Reviewed: 01/22/2015 Elsevier Interactive Patient Education  [employer].

## 2017-09-28 NOTE — Assessment & Plan Note (Signed)
Pt denies significant respiratory concerns. Not taking any respiratory medication at this time. Reviewed importance of avoiding humid heat.

## 2017-09-28 NOTE — Progress Notes (Signed)
BP 130/82 (BP Location: Left Arm, Patient Position: Sitting, Cuff Size: Normal)   Pulse 82   Temp 98.1 F (36.7 C) (Oral)   Ht 5\' 6"  (1.676 m)   Wt 143 lb 8 oz (65.1 kg)   SpO2 97%   BMI 23.16 kg/m    Hearing Screening   125Hz  250Hz  500Hz  1000Hz  2000Hz  3000Hz  4000Hz  6000Hz  8000Hz   Right ear:   20 40 40  0    Left ear:   20 40 40  0      Visual Acuity Screening   Right eye Left eye Both eyes  Without correction: 20/25 20/25 20/20   With correction:      CC: AMW Subjective:    Patient ID: Charles Daniel, male    DOB: 02/26/1941, 77 y.o.   MRN: [id number]  HPI: Charles Daniel is a 77 y.o. male presenting on 09/28/2017 for Medicare Wellness   Did not see Katha Cabal this year.  HIV followed by Dr Megan Salon.  H/o COPD - feels breathing is doing well.   Preventative: COLONOSCOPY 10/2016 7 TAs, diverticulosis, no rpt recommended ([name]) H/o prostate cancer - sees [facility] yearly, s/p radioactive seed implant 01/2013. Good report last visit Lung cancer screening - not candidate - smoking 1/3 ppd for 30 yrs Flu shot yearly Pneumovax - 09/2010 and 2007. Prevnar 01/2016 Td - 08/2012 Shingles shot - declines for financial reasons  Advanced directive discussion - doesn't have set up, declines packet. Unsure who would be HCPOA - rocky relationship with wife. Doesn't want prolonged life support if terminal condition. Ok for trial if he could improve.  Seat belt use discussed Sunscreen use discussed. No changing moles on skin Ex smoker - quit 2010  Alcohol - none  Dentist - does not see - has dentures.  Eye exam - not due.   Caffeine: 3 sodas/day Lives with wife. 2 grown sons. Occupation: retired from city of [employer], now works as custodian Radio broadcast assistant in [place]  Edu: 10th grade  Activity: walks at work  Diet: some water, fruits and vegetables regular   Relevant past medical, surgical, family and social history reviewed and updated as indicated. Interim medical history since our last  visit reviewed. Allergies and medications reviewed and updated. Outpatient Medications Prior to Visit  Medication Sig Dispense Refill  . elvitegravir-cobicistat-emtricitabine-tenofovir (GENVOYA) 150-150-200-10 MG TABS tablet Take 1 tablet by mouth daily with breakfast. 30 tablet 11  . hydrochlorothiazide (HYDRODIURIL) 25 MG tablet Take 1 tablet (25 mg total) by mouth daily.    Marland Kitchen albuterol (PROVENTIL HFA;VENTOLIN HFA) 108 (90 Base) MCG/ACT inhaler Inhale 2 puffs into the lungs every 6 (six) hours as needed for wheezing or shortness of breath. 1 Inhaler 2  . azithromycin (ZITHROMAX) 250 MG tablet Take 2 tabs today, then 1 tab daily x 4 days 6 tablet 0  . potassium chloride (K-DUR) 10 MEQ tablet Take 1 tablet (10 mEq total) by mouth every Monday, Wednesday, and Friday. 30 tablet 11   Facility-Administered Medications Prior to Visit  Medication Dose Route Frequency Provider Last Rate Last Dose  . 0.9 %  sodium chloride infusion  500 mL Intravenous Continuous [name], Estill Cotta III, MD         Per HPI unless specifically indicated in ROS section below Review of Systems     Objective:    BP 130/82 (BP Location: Left Arm, Patient Position: Sitting, Cuff Size: Normal)   Pulse 82   Temp 98.1 F (36.7 C) (Oral)   Ht 5\' 6"  (  1.676 m)   Wt 143 lb 8 oz (65.1 kg)   SpO2 97%   BMI 23.16 kg/m   Wt Readings from Last 3 Encounters:  09/28/17 143 lb 8 oz (65.1 kg)  06/10/17 146 lb (66.2 kg)  12/10/16 139 lb (63 kg)    Physical Exam  Constitutional: He is oriented to person, place, and time. He appears well-developed and well-nourished. No distress.  HENT:  Head: Normocephalic and atraumatic.  Right Ear: Hearing, tympanic membrane, external ear and ear canal normal.  Left Ear: Hearing, tympanic membrane, external ear and ear canal normal.  Nose: Nose normal.  Mouth/Throat: Uvula is midline, oropharynx is clear and moist and mucous membranes are normal. No oropharyngeal exudate, posterior  oropharyngeal edema or posterior oropharyngeal erythema.  Eyes: Pupils are equal, round, and reactive to light. Conjunctivae and EOM are normal. No scleral icterus.  Neck: Normal range of motion. Neck supple. Carotid bruit is not present. No thyromegaly present.  Cardiovascular: Normal rate, regular rhythm, normal heart sounds and intact distal pulses.  No murmur heard. Pulses:      Radial pulses are 2+ on the right side, and 2+ on the left side.  Pulmonary/Chest: Effort normal and breath sounds normal. No respiratory distress. He has no wheezes. He has no rales.  Coarse LLL  Abdominal: Soft. Bowel sounds are normal. He exhibits no distension and no mass. There is no tenderness. There is no rebound and no guarding.  Musculoskeletal: Normal range of motion. He exhibits no edema.  Lymphadenopathy:    He has no cervical adenopathy.  Neurological: He is alert and oriented to person, place, and time.  CN grossly intact, station and gait intact Recall 2/3, 2/3 with cue  Calculation 5/5 serial 3s  Skin: Skin is warm and dry. No rash noted.  Psychiatric: He has a normal mood and affect. His behavior is normal. Judgment and thought content normal.  Nursing note and vitals reviewed.  Results for orders placed or performed in visit on 11/26/16  T-helper cell (CD4)- ([facility] clinic only)  Result Value Ref Range   CD4 T Cell Abs 770 400 - 2,700 /uL   CD4 % Helper T Cell 28 (L) 33 - 55 %  HIV 1 RNA quant-no reflex-bld  Result Value Ref Range   HIV 1 RNA Quant <20 NOT DETECTED NOT DETECTED copies/mL   HIV-1 RNA Quant, Log <1.30 NOT DETECTED NOT DETECTED Log copies/mL  CBC  Result Value Ref Range   WBC 13.2 (H) 3.8 - 10.8 K/uL   RBC 4.91 4.20 - 5.80 MIL/uL   Hemoglobin 13.7 13.2 - 17.1 g/dL   HCT 42.4 38.5 - 50.0 %   MCV 86.4 80.0 - 100.0 fL   MCH 27.9 27.0 - 33.0 pg   MCHC 32.3 32.0 - 36.0 g/dL   RDW 15.1 (H) 11.0 - 15.0 %   Platelets 265 140 - 400 K/uL   MPV 10.3 7.5 - 12.5 fL  Comprehensive  metabolic panel  Result Value Ref Range   Sodium 137 135 - 146 mmol/L   Potassium 3.0 (L) 3.5 - 5.3 mmol/L   Chloride 98 98 - 110 mmol/L   CO2 25 20 - 31 mmol/L   Glucose, Bld 110 (H) 65 - 99 mg/dL   BUN 11 7 - 25 mg/dL   Creat 1.03 0.70 - 1.18 mg/dL   Total Bilirubin 0.5 0.2 - 1.2 mg/dL   Alkaline Phosphatase 58 40 - 115 U/L   AST 16 10 - 35 U/L  ALT 21 9 - 46 U/L   Total Protein 6.5 6.1 - 8.1 g/dL   Albumin 3.5 (L) 3.6 - 5.1 g/dL   Calcium 8.6 8.6 - 10.3 mg/dL  RPR  Result Value Ref Range   RPR Ser Ql NON REAC NON REAC      Assessment & Plan:   Problem List Items Addressed This Visit    Advanced care planning/counseling discussion    Advanced directive discussion - doesn't have set up, declines packet. Unsure who would be HCPOA - rocky relationship with wife. Doesn't want prolonged life support if terminal condition. Ok for trial if he could improve. this will be ongoing discussion. Does not want family aware of his medical conditions.       COPD with emphysema ([place])    Pt denies significant respiratory concerns. Not taking any respiratory medication at this time. Reviewed importance of avoiding humid heat.       Essential hypertension    Chronic, stable. Continue hctz. Has not been taking Kdur. Check labs today.       Relevant Medications   hydrochlorothiazide (HYDRODIURIL) 25 MG tablet   Other Relevant Orders   Basic metabolic panel   Ex-smoker    Congratulated on ongoing abstinence.       Human immunodeficiency virus (HIV) disease ([place])   Medicare annual wellness visit, subsequent - Primary    I have personally reviewed the Medicare Annual Wellness questionnaire and have noted 1. The patient's medical and social history 2. Their use of alcohol, tobacco or illicit drugs 3. Their current medications and supplements 4. The patient's functional ability including ADL's, fall risks, home safety risks and hearing or visual impairment. Cognitive function has been  assessed and addressed as indicated.  5. Diet and physical activity 6. Evidence for depression or mood disorders The patients weight, height, BMI have been recorded in the chart. I have made referrals, counseling and provided education to the patient based on review of the above and I have provided the pt with a written personalized care plan for preventive services. Provider list updated.. See scanned questionairre as needed for further documentation. Reviewed preventative protocols and updated unless pt declined.       Prostate CA [facility]) (Chronic)    Sees urology yearly Karsten Ro) s/p brachytherapy 2017          Meds ordered this encounter  Medications  . hydrochlorothiazide (HYDRODIURIL) 25 MG tablet    Sig: Take 1 tablet (25 mg total) by mouth daily.    Dispense:  90 tablet    Refill:  3   Orders Placed This Encounter  Procedures  . Basic metabolic panel    Follow up plan: Return in about 1 year (around 09/29/2018), or if symptoms worsen or fail to improve, for medicare wellness visit.  [name], MD

## 2017-09-29 ENCOUNTER — Other Ambulatory Visit: Payer: Self-pay | Admitting: Family Medicine

## 2017-09-29 LAB — BASIC METABOLIC PANEL
BUN: 11 mg/dL (ref 6–23)
CALCIUM: 9.3 mg/dL (ref 8.4–10.5)
CO2: 31 mEq/L (ref 19–32)
Chloride: 100 mEq/L (ref 96–112)
Creatinine, Ser: 1.13 mg/dL (ref 0.40–1.50)
GFR: 80.83 mL/min (ref >60.00)
GLUCOSE: 85 mg/dL (ref 70–99)
POTASSIUM: 3.4 meq/L — AB (ref 3.5–5.1)
SODIUM: 140 meq/L (ref 135–145)

## 2017-09-29 MED ORDER — POTASSIUM CHLORIDE ER 10 MEQ PO TBCR: 10.0000 meq | EXTENDED_RELEASE_TABLET | Freq: Every day | ORAL | 3 refills | Status: DC

## 2017-10-08 MED FILL — GENVOYA TABLET: 150-150-200 | 30 days supply | Qty: 30 | Fill #6

## 2017-11-02 MED FILL — GENVOYA TABLET: 150-150-200 | 30 days supply | Qty: 30 | Fill #7

## 2017-11-05 ENCOUNTER — Telehealth: Payer: Self-pay | Admitting: Family Medicine

## 2017-11-05 DIAGNOSIS — I10 Essential (primary) hypertension: Secondary | ICD-10-CM

## 2017-11-08 NOTE — Telephone Encounter (Signed)
Attempted to return pt's call. No answer. No vm.  Need inform pt a rx for HCTZ was sent to the [facility] with enough refills to last until 09/2018.

## 2017-11-08 NOTE — Telephone Encounter (Signed)
Pt calling and states he is out of his blood pressure medication and would like to see if it can be sent in asap.

## 2017-11-09 ENCOUNTER — Other Ambulatory Visit: Payer: Medicare Other

## 2017-11-09 ENCOUNTER — Telehealth: Payer: Self-pay | Admitting: Family Medicine

## 2017-11-09 DIAGNOSIS — B20 Human immunodeficiency virus [HIV] disease: Secondary | ICD-10-CM | POA: Diagnosis not present

## 2017-11-09 NOTE — Telephone Encounter (Signed)
Pt requesting refills from [facility] to [facility] in [place]. Pt will contact [facility] and have his meds transferred from [facility]. Pt voiced understanding. Pt will cb if problems.

## 2017-11-09 NOTE — Telephone Encounter (Signed)
Spoke with pt informing him a prescription for HCTZ was sent to [facility] in May with a year of refills.  Says he will contact them to fill it.  Expresses his thanks for the call.

## 2017-11-10 LAB — T-HELPER CELL (CD4) - (RCID CLINIC ONLY)
CD4 T CELL HELPER: 28 % — AB (ref 33–55)
CD4 T Cell Abs: 810 /uL (ref 400–2700)

## 2017-11-11 LAB — COMPREHENSIVE METABOLIC PANEL
AG RATIO: 1.1 (calc) (ref 1.0–2.5)
ALT: 13 U/L (ref 9–46)
AST: 18 U/L (ref 10–35)
Albumin: 3.9 g/dL (ref 3.6–5.1)
Alkaline phosphatase (APISO): 80 U/L (ref 40–115)
BILIRUBIN TOTAL: 0.4 mg/dL (ref 0.2–1.2)
BUN: 12 mg/dL (ref 7–25)
CALCIUM: 9.1 mg/dL (ref 8.6–10.3)
CHLORIDE: 99 mmol/L (ref 98–110)
CO2: 34 mmol/L — AB (ref 20–32)
Creat: 1.12 mg/dL (ref 0.70–1.18)
Globulin: 3.6 g/dL (calc) (ref 1.9–3.7)
Glucose, Bld: 87 mg/dL (ref 65–99)
Potassium: 3.4 mmol/L — ABNORMAL LOW (ref 3.5–5.3)
Sodium: 139 mmol/L (ref 135–146)
Total Protein: 7.5 g/dL (ref 6.1–8.1)

## 2017-11-11 LAB — CBC
HCT: 39.7 % (ref 38.5–50.0)
Hemoglobin: 13.4 g/dL (ref 13.2–17.1)
MCH: 27.9 pg (ref 27.0–33.0)
MCHC: 33.8 g/dL (ref 32.0–36.0)
MCV: 82.5 fL (ref 80.0–100.0)
MPV: 11.2 fL (ref 7.5–12.5)
PLATELETS: 255 10*3/uL (ref 140–400)
RBC: 4.81 10*6/uL (ref 4.20–5.80)
RDW: 13.9 % (ref 11.0–15.0)
WBC: 8.2 10*3/uL (ref 3.8–10.8)

## 2017-11-11 LAB — RPR TITER: RPR Titer: 1:1 {titer} — ABNORMAL HIGH

## 2017-11-11 LAB — HIV-1 RNA QUANT-NO REFLEX-BLD
HIV 1 RNA Quant: <20 copies/mL — AB
HIV-1 RNA Quant, Log: <1.3 Log copies/mL — AB

## 2017-11-11 LAB — RPR: RPR Ser Ql: REACTIVE — AB

## 2017-11-11 LAB — FLUORESCENT TREPONEMAL AB(FTA)-IGG-BLD: FLUORESCENT TREPONEMAL ABS: REACTIVE — AB

## 2017-11-18 ENCOUNTER — Encounter: Payer: Self-pay | Admitting: Emergency Medicine

## 2017-11-18 ENCOUNTER — Other Ambulatory Visit: Payer: Self-pay

## 2017-11-18 ENCOUNTER — Emergency Department: Payer: Medicare Other

## 2017-11-18 ENCOUNTER — Ambulatory Visit: Payer: Medicare Other | Admitting: Internal Medicine

## 2017-11-18 ENCOUNTER — Observation Stay
Admission: EM | Admit: 2017-11-18 | Discharge: 2017-11-19 | Disposition: A | Discharge status: Home / Self Care | Payer: Medicare Other | Attending: Internal Medicine | Admitting: Internal Medicine

## 2017-11-18 DIAGNOSIS — B20 Human immunodeficiency virus [HIV] disease: Secondary | ICD-10-CM | POA: Diagnosis not present

## 2017-11-18 DIAGNOSIS — G459 Transient cerebral ischemic attack, unspecified: Secondary | ICD-10-CM | POA: Diagnosis not present

## 2017-11-18 DIAGNOSIS — N529 Male erectile dysfunction, unspecified: Secondary | ICD-10-CM | POA: Insufficient documentation

## 2017-11-18 DIAGNOSIS — Z79899 Other long term (current) drug therapy: Secondary | ICD-10-CM | POA: Diagnosis not present

## 2017-11-18 DIAGNOSIS — Z21 Asymptomatic human immunodeficiency virus [HIV] infection status: Secondary | ICD-10-CM | POA: Insufficient documentation

## 2017-11-18 DIAGNOSIS — R4182 Altered mental status, unspecified: Secondary | ICD-10-CM | POA: Diagnosis not present

## 2017-11-18 DIAGNOSIS — Z87891 Personal history of nicotine dependence: Secondary | ICD-10-CM | POA: Diagnosis not present

## 2017-11-18 DIAGNOSIS — Z8546 Personal history of malignant neoplasm of prostate: Secondary | ICD-10-CM | POA: Diagnosis not present

## 2017-11-18 DIAGNOSIS — E876 Hypokalemia: Secondary | ICD-10-CM | POA: Insufficient documentation

## 2017-11-18 DIAGNOSIS — Z7982 Long term (current) use of aspirin: Secondary | ICD-10-CM | POA: Insufficient documentation

## 2017-11-18 DIAGNOSIS — J449 Chronic obstructive pulmonary disease, unspecified: Secondary | ICD-10-CM | POA: Diagnosis not present

## 2017-11-18 DIAGNOSIS — F329 Major depressive disorder, single episode, unspecified: Secondary | ICD-10-CM | POA: Diagnosis not present

## 2017-11-18 DIAGNOSIS — Z85048 Personal history of other malignant neoplasm of rectum, rectosigmoid junction, and anus: Secondary | ICD-10-CM | POA: Diagnosis not present

## 2017-11-18 DIAGNOSIS — M1631 Unilateral osteoarthritis resulting from hip dysplasia, right hip: Secondary | ICD-10-CM | POA: Insufficient documentation

## 2017-11-18 DIAGNOSIS — I639 Cerebral infarction, unspecified: Secondary | ICD-10-CM

## 2017-11-18 DIAGNOSIS — I10 Essential (primary) hypertension: Secondary | ICD-10-CM | POA: Diagnosis not present

## 2017-11-18 DIAGNOSIS — R29700 NIHSS score 0: Secondary | ICD-10-CM | POA: Diagnosis not present

## 2017-11-18 DIAGNOSIS — R4701 Aphasia: Secondary | ICD-10-CM | POA: Diagnosis not present

## 2017-11-18 DIAGNOSIS — I69320 Aphasia following cerebral infarction: Secondary | ICD-10-CM | POA: Insufficient documentation

## 2017-11-18 LAB — COMPREHENSIVE METABOLIC PANEL
ALBUMIN: 3.7 g/dL (ref 3.5–5.0)
ALT: 15 U/L (ref 0–44)
AST: 19 U/L (ref 15–41)
Alkaline Phosphatase: 77 U/L (ref 38–126)
Anion gap: 9 (ref 5–15)
BUN: 9 mg/dL (ref 8–23)
CALCIUM: 9 mg/dL (ref 8.9–10.3)
CO2: 28 mmol/L (ref 22–32)
CREATININE: 1.15 mg/dL (ref 0.61–1.24)
Chloride: 103 mmol/L (ref 98–111)
GFR calc Af Amer: >60 mL/min (ref >60)
GFR calc non Af Amer: 60 mL/min — ABNORMAL LOW (ref >60)
GLUCOSE: 101 mg/dL — AB (ref 70–99)
Potassium: 2.9 mmol/L — ABNORMAL LOW (ref 3.5–5.1)
SODIUM: 140 mmol/L (ref 135–145)
Total Bilirubin: 0.7 mg/dL (ref 0.3–1.2)
Total Protein: 7.9 g/dL (ref 6.5–8.1)

## 2017-11-18 LAB — URINALYSIS, COMPLETE (UACMP) WITH MICROSCOPIC
Bacteria, UA: NONE SEEN
Bilirubin Urine: NEGATIVE
GLUCOSE, UA: NEGATIVE mg/dL
Hgb urine dipstick: NEGATIVE
Ketones, ur: NEGATIVE mg/dL
Leukocytes, UA: NEGATIVE
Nitrite: NEGATIVE
PH: 6 (ref 5.0–8.0)
PROTEIN: NEGATIVE mg/dL
Specific Gravity, Urine: 1.02 (ref 1.005–1.030)

## 2017-11-18 LAB — CBC
HCT: 40.2 % (ref 40.0–52.0)
Hemoglobin: 13.4 g/dL (ref 13.0–18.0)
MCH: 28.5 pg (ref 26.0–34.0)
MCHC: 33.3 g/dL (ref 32.0–36.0)
MCV: 85.8 fL (ref 80.0–100.0)
PLATELETS: 203 10*3/uL (ref 150–440)
RBC: 4.69 MIL/uL (ref 4.40–5.90)
RDW: 15 % — ABNORMAL HIGH (ref 11.5–14.5)
WBC: 9 10*3/uL (ref 3.8–10.6)

## 2017-11-18 LAB — MAGNESIUM: MAGNESIUM: 2 mg/dL (ref 1.7–2.4)

## 2017-11-18 LAB — TROPONIN I: Troponin I: <0.03 ng/mL (ref <0.03)

## 2017-11-18 MED ORDER — HYDRALAZINE HCL 20 MG/ML IJ SOLN
INTRAMUSCULAR | Status: AC
  Administered 2017-11-18: 10 mg via INTRAVENOUS
  Filled 2017-11-18: qty 1

## 2017-11-18 MED ORDER — ACETAMINOPHEN 650 MG RE SUPP: 650.0000 mg | Freq: Four times a day (QID) | RECTAL | Status: DC | PRN

## 2017-11-18 MED ORDER — POTASSIUM CHLORIDE CRYS ER 10 MEQ PO TBCR: 10.0000 meq | EXTENDED_RELEASE_TABLET | Freq: Every day | ORAL | Status: DC

## 2017-11-18 MED ORDER — ASPIRIN EC 81 MG PO TBEC
81.0000 mg | DELAYED_RELEASE_TABLET | Freq: Every day | ORAL | Status: DC
  Administered 2017-11-19: 81 mg via ORAL
  Filled 2017-11-18: qty 1

## 2017-11-18 MED ORDER — ASPIRIN 81 MG PO CHEW
324.0000 mg | CHEWABLE_TABLET | Freq: Once | ORAL | Status: AC
  Administered 2017-11-18: 324 mg via ORAL
  Filled 2017-11-18: qty 4

## 2017-11-18 MED ORDER — ONDANSETRON HCL 4 MG/2ML IJ SOLN: 4.0000 mg | Freq: Four times a day (QID) | INTRAMUSCULAR | Status: DC | PRN

## 2017-11-18 MED ORDER — ELVITEG-COBIC-EMTRICIT-TENOFAF 150-150-200-10 MG PO TABS
1.0000 | ORAL_TABLET | Freq: Every day | ORAL | Status: DC
  Administered 2017-11-19: 09:00:00 1 via ORAL
  Filled 2017-11-18: qty 1

## 2017-11-18 MED ORDER — HYDRALAZINE HCL 20 MG/ML IJ SOLN
10.0000 mg | Freq: Four times a day (QID) | INTRAMUSCULAR | Status: DC | PRN
  Administered 2017-11-18: 10 mg via INTRAVENOUS

## 2017-11-18 MED ORDER — POTASSIUM CHLORIDE CRYS ER 20 MEQ PO TBCR
20.0000 meq | EXTENDED_RELEASE_TABLET | Freq: Three times a day (TID) | ORAL | Status: DC
  Administered 2017-11-18: 20 meq via ORAL
  Filled 2017-11-18: qty 1

## 2017-11-18 MED ORDER — ACETAMINOPHEN 325 MG PO TABS: 650.0000 mg | ORAL_TABLET | Freq: Four times a day (QID) | ORAL | Status: DC | PRN

## 2017-11-18 MED ORDER — ONDANSETRON HCL 4 MG PO TABS: 4.0000 mg | ORAL_TABLET | Freq: Four times a day (QID) | ORAL | Status: DC | PRN

## 2017-11-18 MED ORDER — ENOXAPARIN SODIUM 40 MG/0.4ML ~~LOC~~ SOLN
40.0000 mg | SUBCUTANEOUS | Status: DC
  Administered 2017-11-18: 40 mg via SUBCUTANEOUS
  Filled 2017-11-18: qty 0.4

## 2017-11-18 NOTE — ED Provider Notes (Signed)
[facility] Emergency Department Provider Note    First MD Initiated Contact with Patient 11/18/17 1753     (approximate)  I have reviewed the triage vital signs and the nursing notes.   HISTORY  Chief Complaint Altered Mental Status    HPI Charles Daniel is a 77 y.o. male medical history as described below presents the ER with chief complaint of reported altered mental status and confusion.  Patient arrives to the ER with no complaints and is uncertain as to why he is brought to the ER.  According to EMS and family the patient was confused did not recognize his family and was reportedly slurring his speech.  The symptoms have resolved.  Does have a history of stroke roughly 5 years ago.  He denies any chest pain or shortness of breath.  No nausea or vomiting.  He is alert and oriented x3.    Past Medical History:  Diagnosis Date  . Bilateral hydrocele 2012  . Depression   . Diverticulosis 2013   by colonoscopy  . Emphysema lung ([place]) 03/2014    by CXR, remote smoking history  . History of colon cancer    2007 --  S/P RECTOSIGMOID COLECTOMY--  NO CHEMORADIATION--  NO RECURRENCE  . History of CVA (cerebrovascular accident)    2003-  RIGHT MIDDLE CVA---   NO RESIDUAL  . History of hepatitis B    REMOTE AND INACTIVE  PER DOCUMENTATION  . History of syphilis    SECONDARY SYPHILITIS TX'D IN 1998  PER DOCUMENTATION  . History of tuberculosis    LATENT TB  TX'D X12  MONTHS IN 1995  . HIV infection ([place])    [place] (DR CAMPBELL)  . Hypertension   . Prostate carcinoma [facility]) dx 09/2012   T1c, brachytherapy/seed implant Karsten Ro, [place])   Family History  Problem Relation Age of Onset  . Cancer Sister        breast  . Breast cancer Sister   . Cancer Brother 35       prostate, treated with seed implant  . Cancer Brother        prostate  . Cancer Brother        prostate  . Cancer Daughter        breast  . Cancer  Other        prostate  . Cancer Father        unsure  . CAD Neg Hx   . Stroke Neg Hx   . Diabetes Neg Hx    Past Surgical History:  Procedure Laterality Date  . COLONOSCOPY  08/2011   3 polyps, diverticulosis, rec rpt 5 yrs [name])  . COLONOSCOPY  10/2016   7 TAs, diverticulosis, no rpt recommended ([name])  . INGUINAL HERNIA REPAIR  1994   UNILATERAL  . LOW ANTERIOR RESECTION RECTOSIGMOID COLON  03-09-2006  . PROSTATE BIOPSY  09/2012   53cc  (MD OFFICE)  . RADIOACTIVE SEED IMPLANT N/A 01/20/2013   Procedure: RADIOACTIVE SEED IMPLANT;  Surgeon: Claybon Jabs, MD;  as well as Milana Kidney MD  . TRANSTHORACIC ECHOCARDIOGRAM  11-16-2001   LV WALL THICKNESS MODERATELY INCREASED/  EF 55-65%/ LVSF NORMAL   Patient Active Problem List   Diagnosis Date Noted  . Hypokalemia 03/20/2016  . COPD exacerbation ([place]) 10/18/2015  . COPD with emphysema ([place]) 10/18/2015  . Advanced care planning/counseling discussion 06/26/2014  . 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 Prostate CA ([place])  11/08/2012  . Medicare annual wellness visit, subsequent 08/02/2012  . 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 History of rectal cancer 04/22/2010  . WEIGHT LOSS 12/18/2008  . CONSTIPATION 03/16/2008  . TUBERCULOSIS 05/15/2006  . Human immunodeficiency virus (HIV) disease ([place]) 05/15/2006  . SYPHILIS 05/15/2006  . 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  . Ex-smoker 05/15/2006  . Essential hypertension 05/15/2006  . CEREBROVASCULAR ACCIDENT 05/15/2006  . HEPATITIS B, HX OF 05/15/2006  . GASTROINTESTINAL HEMORRHAGE, HX OF 05/15/2006      Prior to Admission medications   Medication Sig Start Date End Date Taking? Authorizing Provider  elvitegravir-cobicistat-emtricitabine-tenofovir (GENVOYA) 150-150-200-10 MG TABS tablet Take 1 tablet by mouth daily with breakfast. 01/27/17   Michel Bickers, MD  hydrochlorothiazide (HYDRODIURIL) 25 MG tablet Take 1 tablet (25 mg total) by mouth daily.  09/28/17   [name], MD  potassium chloride (K-DUR) 10 MEQ tablet Take 1 tablet (10 mEq total) by mouth daily. 09/29/17   [name], MD    Allergies Patient has no known allergies.    Social History Social History   Tobacco Use  . Smoking status: Former Smoker    Packs/day: 0.30    Years: 35.00    Pack years: 10.50    Types: Cigarettes    Last attempt to quit: 03/04/2009    Years since quitting: 8.7  . Smokeless tobacco: Never Used  Substance Use Topics  . Alcohol use: No    Alcohol/week: 0.0 oz  . Drug use: No    Review of Systems Patient denies headaches, rhinorrhea, blurry vision, numbness, shortness of breath, chest pain, edema, cough, abdominal pain, nausea, vomiting, diarrhea, dysuria, fevers, rashes or hallucinations unless otherwise stated above in HPI. ____________________________________________   PHYSICAL EXAM:  VITAL SIGNS: Vitals:   11/18/17 1753  BP: (!) 180/98  Pulse: 78  Resp: 18  Temp: 98.6 F (37 C)  SpO2: 99%    Constitutional: Alert and oriented.  Eyes: Conjunctivae are normal.  Head: Atraumatic. Nose: No congestion/rhinnorhea. Mouth/Throat: Mucous membranes are moist.   Neck: No stridor. Painless ROM.  Cardiovascular: Normal rate, regular rhythm. Grossly normal heart sounds.  Good peripheral circulation. Respiratory: Normal respiratory effort.  No retractions. Lungs CTAB. Gastrointestinal: Soft and nontender. No distention. No abdominal bruits. No CVA tenderness. Genitourinary: deferred Musculoskeletal: No lower extremity tenderness nor edema.  No joint effusions. Neurologic:  CN- intact.  No facial droop, Normal FNF.  Normal heel to shin.  Sensation intact bilaterally. Normal speech and language. No gross focal neurologic deficits are appreciated. No gait instability. Skin:  Skin is warm, dry and intact. No rash noted. Psychiatric: Mood and affect are normal. Speech and behavior are  normal.  ____________________________________________   LABS (all labs ordered are listed, but only abnormal results are displayed)  Results for orders placed or performed during the hospital encounter of 11/18/17 (from the past 24 hour(s))  CBC     Status: Abnormal   Collection Time: 11/18/17  5:57 PM  Result Value Ref Range   WBC 9.0 3.8 - 10.6 K/uL   RBC 4.69 4.40 - 5.90 MIL/uL   Hemoglobin 13.4 13.0 - 18.0 g/dL   HCT 40.2 40.0 - 52.0 %   MCV 85.8 80.0 - 100.0 fL   MCH 28.5 26.0 - 34.0 pg   MCHC 33.3 32.0 - 36.0 g/dL   RDW 15.0 (H) 11.5 - 14.5 %   Platelets 203 150 - 440 K/uL   ____________________________________________  EKG My review and personal interpretation at Time: 17:50  Indication: tia  Rate: 80  Rhythm: sinus Axis: normal Other: normal intervals, no stemi ____________________________________________  RADIOLOGY  I personally reviewed all radiographic images ordered to evaluate for the above acute complaints and reviewed radiology reports and findings.  These findings were personally discussed with the patient.  Please see medical record for radiology report.  ____________________________________________   PROCEDURES  Procedure(s) performed:  Procedures    Critical Care performed: no ____________________________________________   INITIAL IMPRESSION / ASSESSMENT AND PLAN / ED COURSE  Pertinent labs & imaging results that were available during my care of the patient were reviewed by me and considered in my medical decision making (see chart for details).   DDX: cva, tia, hypoglycemia, dehydration, electrolyte abnormality, dissection, sepsis   Charles Daniel is a 77 y.o. who presents to the ED with past medical history presents to the ER with symptoms concerning for TIA.  His is not a tpa candidate.  His neuro exam is nonfocal at this time.  He is afebrile.  Blood work sent for the above differential was reassuring.  He is in sinus rhythm.  CT imaging  ordered for the above differential does not show evidence of traumatic injury or mass.  Does have microvascular changes and remote thalamic infarct.  Based on his presentation I am concerned for significant TIA and he does not have a recent echo carotids and is not on any antiplatelet therapy.  Based on his high risk features I do believe patient would benefit from hospitalization for further medical management and work-up.      As part of my medical decision making, I reviewed the following data within the [place] notes reviewed and incorporated, Labs reviewed, notes from prior ED visits.   ____________________________________________   FINAL CLINICAL IMPRESSION(S) / ED DIAGNOSES  Final diagnoses:  TIA (transient ischemic attack)      NEW MEDICATIONS STARTED DURING THIS VISIT:  New Prescriptions   No medications on file     Note:  This document was prepared using Dragon voice recognition software and may include unintentional dictation errors.    Merlyn Lot, MD 11/18/17 [phone]

## 2017-11-18 NOTE — ED Triage Notes (Signed)
Pt to ED from home via EMS c/o AMS.  EMS states patient left for work around 5-6am and was normal per family.  Patient came home from work an hour later than normal and is disoriented x4.  EMS vitals CBG 100, 188/90 BP, 88 HR.  Pt had prior stroke 5 years ago.  Pt presents A&Ox4, speaking in complete and coherent sentences.

## 2017-11-18 NOTE — H&P (Signed)
[facility] [place] NAME: Charles Daniel    MR#:  [id number]  DATE OF BIRTH:  Jul 15, 1940  DATE OF ADMISSION:  11/18/2017  PRIMARY CARE PHYSICIAN: [name], MD   REQUESTING/REFERRING PHYSICIAN: Dr. Merlyn Lot  CHIEF COMPLAINT:   Chief Complaint  Patient presents with  . Altered Mental Status    HISTORY OF PRESENT ILLNESS:  Charles Daniel  is a 77 y.o. male with a known history of HIV, COPD, depression, diverticulosis, essential hypertension, history of prostate cancer who presents to the hospital due to slurred speech/aphasia.  Patient his usual state of health until he came home and his family noticed that he was not talking right.  As per the family they would ask him a question and he would respond and the answer would not make any sense.  They also asked him to identify the people in the house and he could not identify them, and he could not remember his own name.  Family then called EMS and he was brought to the hospital.  As per the family did not notice any facial asymmetry, any focal weakness that he complained of.  Patient does not recall the events very well.  There has been no seizure type activity, incontinence.  He denies any headache, blurry vision or any focal weakness or numbness or tingling.  Hospitalist services were contacted for admission.  PAST MEDICAL HISTORY:   Past Medical History:  Diagnosis Date  . Bilateral hydrocele 2012  . Depression   . Diverticulosis 2013   by colonoscopy  . Emphysema lung ([place]) 03/2014    by CXR, remote smoking history  . History of colon cancer    2007 --  S/P RECTOSIGMOID COLECTOMY--  NO CHEMORADIATION--  NO RECURRENCE  . History of CVA (cerebrovascular accident)    2003-  RIGHT MIDDLE CVA---   NO RESIDUAL  . History of hepatitis B    REMOTE AND INACTIVE  PER DOCUMENTATION  . History of syphilis    SECONDARY SYPHILITIS TX'D IN 1998  PER DOCUMENTATION  . History of  tuberculosis    LATENT TB  TX'D X12  MONTHS IN 1995  . HIV infection ([place])    [place] (DR CAMPBELL)  . Hypertension   . Prostate carcinoma [facility]) dx 09/2012   T1c, brachytherapy/seed implant Karsten Ro, Tammi Klippel)    PAST SURGICAL HISTORY:   Past Surgical History:  Procedure Laterality Date  . COLONOSCOPY  08/2011   3 polyps, diverticulosis, rec rpt 5 yrs [name])  . COLONOSCOPY  10/2016   7 TAs, diverticulosis, no rpt recommended ([name])  . INGUINAL HERNIA REPAIR  1994   UNILATERAL  . LOW ANTERIOR RESECTION RECTOSIGMOID COLON  03-09-2006  . PROSTATE BIOPSY  09/2012   53cc  (MD OFFICE)  . RADIOACTIVE SEED IMPLANT N/A 01/20/2013   Procedure: RADIOACTIVE SEED IMPLANT;  Surgeon: Claybon Jabs, MD;  as well as Milana Kidney MD  . TRANSTHORACIC ECHOCARDIOGRAM  11-16-2001   LV WALL THICKNESS MODERATELY INCREASED/  EF 55-65%/ LVSF NORMAL    SOCIAL HISTORY:   Social History   Tobacco Use  . Smoking status: Former Smoker    Packs/day: 0.30    Years: 35.00    Pack years: 10.50    Types: Cigarettes    Last attempt to quit: 03/04/2009    Years since quitting: 8.7  . Smokeless tobacco: Never Used  Substance Use Topics  . Alcohol use: No  Alcohol/week: 0.0 oz    FAMILY HISTORY:   Family History  Problem Relation Age of Onset  . Cancer Sister        breast  . Breast cancer Sister   . Cancer Brother 58       prostate, treated with seed implant  . Cancer Brother        prostate  . Cancer Brother        prostate  . Cancer Daughter        breast  . Cancer Other        prostate  . Cancer Father        unsure  . CAD Neg Hx   . Stroke Neg Hx   . Diabetes Neg Hx     DRUG ALLERGIES:  No Known Allergies  REVIEW OF SYSTEMS:   Review of Systems  Constitutional: Negative for fever and weight loss.  HENT: Negative for congestion, nosebleeds and tinnitus.   Eyes: Negative for blurred vision, double vision and redness.  Respiratory: Negative  for cough, hemoptysis and shortness of breath.   Cardiovascular: Negative for chest pain, orthopnea, leg swelling and PND.  Gastrointestinal: Negative for abdominal pain, diarrhea, melena, nausea and vomiting.  Genitourinary: Negative for dysuria, hematuria and urgency.  Musculoskeletal: Negative for falls and joint pain.  Neurological: Positive for speech change. Negative for dizziness, tingling, focal weakness, seizures, weakness and headaches.  Endo/Heme/Allergies: Negative for polydipsia. Does not bruise/bleed easily.  Psychiatric/Behavioral: Negative for depression and memory loss. The patient is not nervous/anxious.     MEDICATIONS AT HOME:   Prior to Admission medications   Medication Sig Start Date End Date Taking? Authorizing Provider  elvitegravir-cobicistat-emtricitabine-tenofovir (GENVOYA) 150-150-200-10 MG TABS tablet Take 1 tablet by mouth daily with breakfast. 01/27/17  Yes Michel Bickers, MD  hydrochlorothiazide (HYDRODIURIL) 25 MG tablet Take 1 tablet (25 mg total) by mouth daily. 09/28/17  Yes [name], MD  potassium chloride (K-DUR) 10 MEQ tablet Take 1 tablet (10 mEq total) by mouth daily. 09/29/17  Yes [name], MD      VITAL SIGNS:  Blood pressure (!) 180/98, pulse 78, temperature 97.9 F (36.6 C), resp. rate 18, height 5\' 6"  (1.676 m), weight 70.8 kg (156 lb), SpO2 99 %.  PHYSICAL EXAMINATION:  Physical Exam  GENERAL:  77 y.o.-year-old patient lying in the bed with no acute distress.  EYES: Pupils equal, round, reactive to light and accommodation. No scleral icterus. Extraocular muscles intact.  HEENT: Head atraumatic, normocephalic. Oropharynx and nasopharynx clear. No oropharyngeal erythema, moist oral mucosa  NECK:  Supple, no jugular venous distention. No thyroid enlargement, no tenderness.  LUNGS: Normal breath sounds bilaterally, no wheezing, rales, rhonchi. No use of accessory muscles of respiration.  CARDIOVASCULAR: S1, S2 RRR. No murmurs,  rubs, gallops, clicks.  ABDOMEN: Soft, nontender, nondistended. Bowel sounds present. No organomegaly or mass.  EXTREMITIES: No pedal edema, cyanosis, or clubbing. + 2 pedal & radial pulses b/l.   NEUROLOGIC: Cranial nerves II through XII are intact. No focal Motor or sensory deficits appreciated b/l PSYCHIATRIC: The patient is alert and oriented x 3. Good affect.  SKIN: No obvious rash, lesion, or ulcer.   LABORATORY PANEL:   CBC Recent Labs  Lab 11/18/17 1757  WBC 9.0  HGB 13.4  HCT 40.2  PLT 203   ------------------------------------------------------------------------------------------------------------------  Chemistries  Recent Labs  Lab 11/18/17 1757  NA 140  K 2.9*  CL 103  CO2 28  GLUCOSE 101*  BUN 9  CREATININE 1.15  CALCIUM 9.0  AST 19  ALT 15  ALKPHOS 77  BILITOT 0.7   ------------------------------------------------------------------------------------------------------------------  Cardiac Enzymes No results for input(s): TROPONINI in the last 168 hours. ------------------------------------------------------------------------------------------------------------------  RADIOLOGY:  Ct Head Wo Contrast  Result Date: 11/18/2017 CLINICAL DATA:  Altered mental status and disorientation x4. Prior stroke 5 years ago. EXAM: CT HEAD WITHOUT CONTRAST TECHNIQUE: Contiguous axial images were obtained from the base of the skull through the vertex without intravenous contrast. COMPARISON:  None. FINDINGS: Brain: Chronic appearing mild-to-moderate small vessel ischemic disease of periventricular white matter more so on the right. Metallic streak artifacts from metallic foreign bodies within the left frontoparietal scalp limit assessment of the left cerebral hemisphere. Age related involutional changes of the brain. Midline fourth ventricle and basal cisterns without effacement. No intra-axial mass nor extra-axial fluid collections. Small right thalamic lacunar infarct  likely chronic. Vascular: Hyperdense vessel signs. Skull: No acute skull fracture. Sinuses/Orbits: Moderate ethmoid and marked sphenoid sinus mucosal opacification. Under pneumatized frontal sinus. The maxillary sinuses are excluded on this study. The included orbits appear symmetric. No retrobulbar abnormality is seen. Other: Scattered punctate metallic foreign bodies in the left frontal parietal scalp the largest overlying the left parietal skull. IMPRESSION: 1. Metallic streak artifacts from left frontoparietal metallic scalp foreign bodies limit assessment of the left cerebral hemisphere. 2. Mild-to-moderate chronic appearing small vessel ischemic disease with likely chronic right small thalamic lacunar infarct. 3. No acute intracranial abnormality is identified. 4. Mucosal thickening of the ethmoid and sphenoid sinuses as above. Electronically Signed   By: Ashley Royalty M.D.   On: 11/18/2017 18:37     IMPRESSION AND PLAN:   77 year old male with past medical history of HIV, hypertension, history of prostate cancer, diverticulosis who presents to the hospital due to aphasia and suspected TIA.  1.  TIA/CVA-this is the working diagnosis given patient's transient aphasia which has now resolved. -CT head was negative for acute pathology.  Will start patient on aspirin, get a lipid profile. -Check echocardiogram, carotid duplex, MRI of the brain.  I will also get a neurology consult.  2.  History of HIV-continue HAART. - Patient cannot recall his last CD4 count or viral load but as per him his disease has been stable and he has had no opportunistic infections.  3.  Hypokalemia-we will place the patient on oral potassium supplements.  Hold his hydrochlorothiazide for now.  4.  Essential hypertension-hold hydrochlorothiazide now given the patient's hypokalemia.   All the records are reviewed and case discussed with ED provider. Management plans discussed with the patient, family and they are in  agreement.  CODE STATUS: Full code  TOTAL TIME TAKING CARE OF THIS PATIENT: 40 minutes.    Henreitta Leber M.D on 11/18/2017 at 7:54 PM  Between 7am to 6pm - Pager - [phone]  After 6pm go to [email or web address] - password [email or web address] [facility]  [place] [facility]  Office  [phone]  CC: Primary care physician; [name], MD

## 2017-11-18 NOTE — ED Notes (Signed)
ED Provider at bedside. 

## 2017-11-19 ENCOUNTER — Observation Stay: Payer: Medicare Other

## 2017-11-19 ENCOUNTER — Observation Stay (HOSPITAL_BASED_OUTPATIENT_CLINIC_OR_DEPARTMENT_OTHER)
Admit: 2017-11-19 | Discharge: 2017-11-19 | Disposition: A | Discharge status: Home / Self Care | Payer: Medicare Other | Attending: Specialist | Admitting: Specialist

## 2017-11-19 DIAGNOSIS — I5030 Unspecified diastolic (congestive) heart failure: Secondary | ICD-10-CM | POA: Diagnosis not present

## 2017-11-19 DIAGNOSIS — I6521 Occlusion and stenosis of right carotid artery: Secondary | ICD-10-CM | POA: Diagnosis not present

## 2017-11-19 DIAGNOSIS — G459 Transient cerebral ischemic attack, unspecified: Secondary | ICD-10-CM | POA: Diagnosis not present

## 2017-11-19 DIAGNOSIS — R479 Unspecified speech disturbances: Secondary | ICD-10-CM | POA: Diagnosis not present

## 2017-11-19 DIAGNOSIS — I639 Cerebral infarction, unspecified: Secondary | ICD-10-CM | POA: Diagnosis not present

## 2017-11-19 DIAGNOSIS — R4701 Aphasia: Secondary | ICD-10-CM | POA: Diagnosis not present

## 2017-11-19 DIAGNOSIS — B20 Human immunodeficiency virus [HIV] disease: Secondary | ICD-10-CM | POA: Diagnosis not present

## 2017-11-19 DIAGNOSIS — I10 Essential (primary) hypertension: Secondary | ICD-10-CM | POA: Diagnosis not present

## 2017-11-19 LAB — LIPID PANEL
Cholesterol: 140 mg/dL (ref 0–200)
HDL: 35 mg/dL — ABNORMAL LOW (ref >40)
LDL CALC: 88 mg/dL (ref 0–99)
Total CHOL/HDL Ratio: 4 RATIO
Triglycerides: 85 mg/dL (ref <150)
VLDL: 17 mg/dL (ref 0–40)

## 2017-11-19 LAB — BASIC METABOLIC PANEL
ANION GAP: 4 — AB (ref 5–15)
BUN: 10 mg/dL (ref 8–23)
CALCIUM: 8.6 mg/dL — AB (ref 8.9–10.3)
CO2: 29 mmol/L (ref 22–32)
CREATININE: 1.07 mg/dL (ref 0.61–1.24)
Chloride: 107 mmol/L (ref 98–111)
GFR calc Af Amer: >60 mL/min (ref >60)
GLUCOSE: 106 mg/dL — AB (ref 70–99)
POTASSIUM: 2.8 mmol/L — AB (ref 3.5–5.1)
SODIUM: 140 mmol/L (ref 135–145)

## 2017-11-19 LAB — HEMOGLOBIN A1C
Hgb A1c MFr Bld: 5.7 % — ABNORMAL HIGH (ref 4.8–5.6)
MEAN PLASMA GLUCOSE: 116.89 mg/dL

## 2017-11-19 LAB — ECHOCARDIOGRAM COMPLETE
Height: 66 in
WEIGHTICAEL: 2496 [oz_av]

## 2017-11-19 LAB — POTASSIUM: Potassium: 3.7 mmol/L (ref 3.5–5.1)

## 2017-11-19 MED ORDER — LISINOPRIL 20 MG PO TABS: 20.0000 mg | ORAL_TABLET | Freq: Every day | ORAL | 0 refills | Status: DC

## 2017-11-19 MED ORDER — STROKE: EARLY STAGES OF RECOVERY BOOK
Freq: Once | Status: AC
  Administered 2017-11-19: 1

## 2017-11-19 MED ORDER — ASPIRIN EC 325 MG PO TBEC: 325.0000 mg | DELAYED_RELEASE_TABLET | Freq: Every day | ORAL | Status: DC

## 2017-11-19 MED ORDER — AMLODIPINE BESYLATE 5 MG PO TABS: 5.0000 mg | ORAL_TABLET | Freq: Every day | ORAL | 0 refills | Status: DC

## 2017-11-19 MED ORDER — POTASSIUM CHLORIDE CRYS ER 20 MEQ PO TBCR
80.0000 meq | EXTENDED_RELEASE_TABLET | Freq: Once | ORAL | Status: AC
  Administered 2017-11-19: 80 meq via ORAL
  Filled 2017-11-19: qty 4

## 2017-11-19 MED ORDER — ASPIRIN 325 MG PO TBEC: 325.0000 mg | DELAYED_RELEASE_TABLET | Freq: Every day | ORAL | 0 refills | Status: DC

## 2017-11-19 MED ORDER — PERFLUTREN LIPID MICROSPHERE
1.0000 mL | INTRAVENOUS | Status: AC | PRN
  Administered 2017-11-19: 2 mL via INTRAVENOUS
  Filled 2017-11-19: qty 10

## 2017-11-19 MED ORDER — ATORVASTATIN CALCIUM 40 MG PO TABS: 40.0000 mg | ORAL_TABLET | Freq: Every day | ORAL | 0 refills | Status: DC

## 2017-11-19 NOTE — Care Management (Signed)
Placed in observation for sx concerning for cva.  Work up is negative.  No discharge needs identified by members of the care team

## 2017-11-19 NOTE — Consult Note (Signed)
Referring Physician: Pyreddy    Chief Complaint: Difficulty with speech  HPI: Charles Daniel is an 77 y.o. male with a history of CVA who reports doing well at work on yesterday.  Came home but talked to no one while driving.  Once he got home his family noticed that he was not talking right.  As per the family they would ask him a question and he would respond but the answer would not make any sense.  They also asked him to identify the people in the house and he could not identify them, and he could not remember his own name.  Patient is now back to baseline.  Initial NIHSS of 0.  Date last known well: Date: 11/18/2017 Time last known well: Time: 14:30 tPA Given: No: Resolution of symptoms  Past Medical History:  Diagnosis Date  . Bilateral hydrocele 2012  . Depression   . Diverticulosis 2013   by colonoscopy  . Emphysema lung ([place]) 03/2014    by CXR, remote smoking history  . History of colon cancer    2007 --  S/P RECTOSIGMOID COLECTOMY--  NO CHEMORADIATION--  NO RECURRENCE  . History of CVA (cerebrovascular accident)    2003-  RIGHT MIDDLE CVA---   NO RESIDUAL  . History of hepatitis B    REMOTE AND INACTIVE  PER DOCUMENTATION  . History of syphilis    SECONDARY SYPHILITIS TX'D IN 1998  PER DOCUMENTATION  . History of tuberculosis    LATENT TB  TX'D X12  MONTHS IN 1995  . HIV infection ([place])    [place] (DR CAMPBELL)  . Hypertension   . Prostate carcinoma [facility]) dx 09/2012   T1c, brachytherapy/seed implant Karsten Ro, Tammi Klippel)    Past Surgical History:  Procedure Laterality Date  . COLONOSCOPY  08/2011   3 polyps, diverticulosis, rec rpt 5 yrs [name])  . COLONOSCOPY  10/2016   7 TAs, diverticulosis, no rpt recommended ([name])  . INGUINAL HERNIA REPAIR  1994   UNILATERAL  . LOW ANTERIOR RESECTION RECTOSIGMOID COLON  03-09-2006  . PROSTATE BIOPSY  09/2012   53cc  (MD OFFICE)  . RADIOACTIVE SEED IMPLANT N/A 01/20/2013   Procedure:  RADIOACTIVE SEED IMPLANT;  Surgeon: Claybon Jabs, MD;  as well as Milana Kidney MD  . TRANSTHORACIC ECHOCARDIOGRAM  11-16-2001   LV WALL THICKNESS MODERATELY INCREASED/  EF 55-65%/ LVSF NORMAL    Family History  Problem Relation Age of Onset  . Cancer Sister        breast  . Breast cancer Sister   . Cancer Brother 42       prostate, treated with seed implant  . Cancer Brother        prostate  . Cancer Brother        prostate  . Cancer Daughter        breast  . Cancer Other        prostate  . Cancer Father        unsure  . CAD Neg Hx   . Stroke Neg Hx   . Diabetes Neg Hx    Social History:  reports that he quit smoking about 8 years ago. His smoking use included cigarettes. He has a 10.50 pack-year smoking history. He has never used smokeless tobacco. He reports that he does not drink alcohol or use drugs.  Allergies: No Known Allergies  Medications:  I have reviewed the patient's current medications. Prior to Admission:  Facility-Administered Medications  Prior to Admission  Medication Dose Route Frequency Provider Last Rate Last Dose  . 0.9 %  sodium chloride infusion  500 mL Intravenous Continuous Nelida Meuse III, MD       Medications Prior to Admission  Medication Sig Dispense Refill Last Dose  . elvitegravir-cobicistat-emtricitabine-tenofovir (GENVOYA) 150-150-200-10 MG TABS tablet Take 1 tablet by mouth daily with breakfast. 30 tablet 11 Taking  . hydrochlorothiazide (HYDRODIURIL) 25 MG tablet Take 1 tablet (25 mg total) by mouth daily. 90 tablet 3   . potassium chloride (K-DUR) 10 MEQ tablet Take 1 tablet (10 mEq total) by mouth daily. 90 tablet 3    Scheduled: . aspirin EC  81 mg Oral Daily  . elvitegravir-cobicistat-emtricitabine-tenofovir  1 tablet Oral Q breakfast  . enoxaparin (LOVENOX) injection  40 mg Subcutaneous Q24H    ROS: History obtained from the patient  General ROS: negative for - chills, fatigue, fever, night sweats, weight gain or weight  loss Psychological ROS: negative for - behavioral disorder, hallucinations, memory difficulties, mood swings or suicidal ideation Ophthalmic ROS: negative for - blurry vision, double vision, eye pain or loss of vision ENT ROS: negative for - epistaxis, nasal discharge, oral lesions, sore throat, tinnitus or vertigo Allergy and Immunology ROS: negative for - hives or itchy/watery eyes Hematological and Lymphatic ROS: negative for - bleeding problems, bruising or swollen lymph nodes Endocrine ROS: negative for - galactorrhea, hair pattern changes, polydipsia/polyuria or temperature intolerance Respiratory ROS: negative for - cough, hemoptysis, shortness of breath or wheezing Cardiovascular ROS: negative for - chest pain, dyspnea on exertion, edema or irregular heartbeat Gastrointestinal ROS: negative for - abdominal pain, diarrhea, hematemesis, nausea/vomiting or stool incontinence Genito-Urinary ROS: negative for - dysuria, hematuria, incontinence or urinary frequency/urgency Musculoskeletal ROS: negative for - joint swelling or muscular weakness Neurological ROS: as noted in HPI Dermatological ROS: negative for rash and skin lesion changes  Physical Examination: Blood pressure (!) 167/89, pulse 69, temperature (!) 97.5 F (36.4 C), temperature source Oral, resp. rate 18, height 5\' 6"  (1.676 m), weight 70.8 kg (156 lb), SpO2 100 %.  HEENT-  Normocephalic, no lesions, without obvious abnormality.  Normal external eye and conjunctiva.  Normal TM's bilaterally.  Normal auditory canals and external ears. Normal external nose, mucus membranes and septum.  Normal pharynx. Cardiovascular- S1, S2 normal, pulses palpable throughout   Lungs- chest clear, no wheezing, rales, normal symmetric air entry Abdomen- soft, non-tender; bowel sounds normal; no masses,  no organomegaly Extremities- no edema Lymph-no adenopathy palpable Musculoskeletal-no joint tenderness, deformity or swelling Skin-warm and dry,  no hyperpigmentation, vitiligo, or suspicious lesions  Neurological Examination   Mental Status: Alert, oriented, thought content appropriate.  Speech fluent without evidence of aphasia.  Able to follow 3 step commands without difficulty. Cranial Nerves: II: Discs flat bilaterally; Visual fields grossly normal, pupils equal, round, reactive to light and accommodation III,IV, VI: ptosis not present, extra-ocular motions intact bilaterally V,VII: smile symmetric, facial light touch sensation normal bilaterally VIII: hearing normal bilaterally IX,X: gag reflex present XI: bilateral shoulder shrug XII: midline tongue extension Motor: Right : Upper extremity   5/5    Left:     Upper extremity   5/5  Lower extremity   5/5     Lower extremity   5/5 Tone and bulk:normal tone throughout; no atrophy noted Sensory: Pinprick and light touch intact throughout, bilaterally Deep Tendon Reflexes: 2+ and symmetric with absent AJ's bilaterally Plantars: Right: downgoing   Left: upgoing Cerebellar: Normal finger-to-nose and normal heel-to-shin  testing bilaterally Gait: normal gait and station    Laboratory Studies:  Basic Metabolic Panel: Recent Labs  Lab 11/18/17 1757 11/19/17 0402  NA 140 140  K 2.9* 2.8*  CL 103 107  CO2 28 29  GLUCOSE 101* 106*  BUN 9 10  CREATININE 1.15 1.07  CALCIUM 9.0 8.6*  MG 2.0  --     Liver Function Tests: Recent Labs  Lab 11/18/17 1757  AST 19  ALT 15  ALKPHOS 77  BILITOT 0.7  PROT 7.9  ALBUMIN 3.7   No results for input(s): LIPASE, AMYLASE in the last 168 hours. No results for input(s): AMMONIA in the last 168 hours.  CBC: Recent Labs  Lab 11/18/17 1757  WBC 9.0  HGB 13.4  HCT 40.2  MCV 85.8  PLT 203    Cardiac Enzymes: Recent Labs  Lab 11/18/17 1757  TROPONINI <0.03    BNP: Invalid input(s): POCBNP  CBG: No results for input(s): GLUCAP in the last 168 hours.  Microbiology: Results for orders placed or performed during the  hospital encounter of 03/19/16  Blood Culture (routine x 2)     Status: None   Collection Time: 03/19/16  2:32 PM  Result Value Ref Range Status   Specimen Description BLOOD LEFT ARM  Final   Special Requests BOTTLES DRAWN AEROBIC AND ANAEROBIC 5CC  Final   Culture   Final    NO GROWTH 5 DAYS Performed at [facility]    Report Status 03/24/2016 FINAL  Final  Blood Culture (routine x 2)     Status: None   Collection Time: 03/19/16  2:42 PM  Result Value Ref Range Status   Specimen Description BLOOD RIGHT ARM  Final   Special Requests BOTTLES DRAWN AEROBIC AND ANAEROBIC 5CC  Final   Culture   Final    NO GROWTH 5 DAYS Performed at [facility]    Report Status 03/24/2016 FINAL  Final  Respiratory Panel by PCR     Status: None   Collection Time: 03/19/16  6:26 PM  Result Value Ref Range Status   Adenovirus NOT DETECTED NOT DETECTED Final   Coronavirus 229E NOT DETECTED NOT DETECTED Final   Coronavirus HKU1 NOT DETECTED NOT DETECTED Final   Coronavirus NL63 NOT DETECTED NOT DETECTED Final   Coronavirus OC43 NOT DETECTED NOT DETECTED Final   Metapneumovirus NOT DETECTED NOT DETECTED Final   Rhinovirus / Enterovirus NOT DETECTED NOT DETECTED Final   Influenza A NOT DETECTED NOT DETECTED Final   Influenza B NOT DETECTED NOT DETECTED Final   Parainfluenza Virus 1 NOT DETECTED NOT DETECTED Final   Parainfluenza Virus 2 NOT DETECTED NOT DETECTED Final   Parainfluenza Virus 3 NOT DETECTED NOT DETECTED Final   Parainfluenza Virus 4 NOT DETECTED NOT DETECTED Final   Respiratory Syncytial Virus NOT DETECTED NOT DETECTED Final   Bordetella pertussis NOT DETECTED NOT DETECTED Final   Chlamydophila pneumoniae NOT DETECTED NOT DETECTED Final   Mycoplasma pneumoniae NOT DETECTED NOT DETECTED Final    Comment: Performed at [facility]  MRSA PCR Screening     Status: None   Collection Time: 03/19/16  6:26 PM  Result Value Ref Range Status   MRSA by PCR NEGATIVE NEGATIVE  Final    Comment:        The GeneXpert MRSA Assay (FDA approved for NASAL specimens only), is one component of a comprehensive MRSA colonization surveillance program. It is not intended to diagnose MRSA infection nor to guide  or monitor treatment for MRSA infections.   Urine culture     Status: None   Collection Time: 03/19/16  7:19 PM  Result Value Ref Range Status   Specimen Description URINE, RANDOM  Final   Special Requests NONE  Final   Culture NO GROWTH Performed at [facility]   Final   Report Status 03/21/2016 FINAL  Final  Culture, expectorated sputum-assessment     Status: None   Collection Time: 03/20/16 11:36 AM  Result Value Ref Range Status   Specimen Description SPUTUM  Final   Special Requests NONE  Final   Sputum evaluation   Final    MICROSCOPIC FINDINGS SUGGEST THAT THIS SPECIMEN IS NOT REPRESENTATIVE OF LOWER RESPIRATORY SECRETIONS. PLEASE RECOLLECT. INFORMED HAULT, B. RN @1151  ON 11.17.17 BY NMCCOY    Report Status 03/20/2016 FINAL  Final  Culture, expectorated sputum-assessment     Status: None   Collection Time: 03/21/16  4:10 AM  Result Value Ref Range Status   Specimen Description SPUTUM  Final   Special Requests NONE  Final   Sputum evaluation   Final    MICROSCOPIC FINDINGS SUGGEST THAT THIS SPECIMEN IS NOT REPRESENTATIVE OF LOWER RESPIRATORY SECRETIONS. PLEASE RECOLLECT. RESULTS CALLED TO, READ BACK BY AND VERIFIED WITH MELISSA Vickki Muff [id number] @ [id number] BY J SCOTTON    Report Status 03/21/2016 FINAL  Final  Culture, expectorated sputum-assessment     Status: None   Collection Time: 03/21/16 11:45 AM  Result Value Ref Range Status   Specimen Description SPUTUM  Final   Special Requests Normal  Final   Sputum evaluation   Final    THIS SPECIMEN IS ACCEPTABLE. RESPIRATORY CULTURE REPORT TO FOLLOW.   Report Status 03/21/2016 FINAL  Final  Culture, respiratory (NON-Expectorated)     Status: None   Collection Time: 03/21/16 11:45 AM   Result Value Ref Range Status   Specimen Description SPUTUM  Final   Special Requests NONE  Final   Gram Stain   Final    MODERATE WBC PRESENT,BOTH PMN AND MONONUCLEAR MODERATE SQUAMOUS EPITHELIAL CELLS PRESENT FEW YEAST FEW GRAM POSITIVE RODS RARE GRAM POSITIVE COCCI    Culture   Final    Consistent with normal respiratory flora. Performed at [facility]    Report Status 03/24/2016 FINAL  Final    Coagulation Studies: No results for input(s): LABPROT, INR in the last 72 hours.  Urinalysis:  Recent Labs  Lab 11/18/17 1925  COLORURINE YELLOW*  LABSPEC 1.020  PHURINE 6.0  GLUCOSEU NEGATIVE  HGBUR NEGATIVE  BILIRUBINUR NEGATIVE  KETONESUR NEGATIVE  PROTEINUR NEGATIVE  NITRITE NEGATIVE  LEUKOCYTESUR NEGATIVE    Lipid Panel:    Component Value Date/Time   CHOL 140 11/19/2017 0402   TRIG 85 11/19/2017 0402   HDL 35 (L) 11/19/2017 0402   CHOLHDL 4.0 11/19/2017 0402   VLDL 17 11/19/2017 0402   LDLCALC 88 11/19/2017 0402    HgbA1C:  Lab Results  Component Value Date   HGBA1C 5.7 (H) 11/19/2017    Urine Drug Screen:  No results found for: LABOPIA, COCAINSCRNUR, LABBENZ, AMPHETMU, THCU, LABBARB  Alcohol Level: No results for input(s): ETH in the last 168 hours.  Other results: EKG: sinus rhythm at 79 bpm.  Imaging: Ct Head Wo Contrast  Result Date: 11/18/2017 CLINICAL DATA:  Altered mental status and disorientation x4. Prior stroke 5 years ago. EXAM: CT HEAD WITHOUT CONTRAST TECHNIQUE: Contiguous axial images were obtained from the base of the skull through the vertex  without intravenous contrast. COMPARISON:  None. FINDINGS: Brain: Chronic appearing mild-to-moderate small vessel ischemic disease of periventricular white matter more so on the right. Metallic streak artifacts from metallic foreign bodies within the left frontoparietal scalp limit assessment of the left cerebral hemisphere. Age related involutional changes of the brain. Midline fourth  ventricle and basal cisterns without effacement. No intra-axial mass nor extra-axial fluid collections. Small right thalamic lacunar infarct likely chronic. Vascular: Hyperdense vessel signs. Skull: No acute skull fracture. Sinuses/Orbits: Moderate ethmoid and marked sphenoid sinus mucosal opacification. Under pneumatized frontal sinus. The maxillary sinuses are excluded on this study. The included orbits appear symmetric. No retrobulbar abnormality is seen. Other: Scattered punctate metallic foreign bodies in the left frontal parietal scalp the largest overlying the left parietal skull. IMPRESSION: 1. Metallic streak artifacts from left frontoparietal metallic scalp foreign bodies limit assessment of the left cerebral hemisphere. 2. Mild-to-moderate chronic appearing small vessel ischemic disease with likely chronic right small thalamic lacunar infarct. 3. No acute intracranial abnormality is identified. 4. Mucosal thickening of the ethmoid and sphenoid sinuses as above. Electronically Signed   By: Ashley Royalty M.D.   On: 11/18/2017 18:37   Mr Brain Wo Contrast  Result Date: 11/19/2017 CLINICAL DATA:  77 y/o  M; speech difficulty. EXAM: MRI HEAD WITHOUT CONTRAST TECHNIQUE: Multiplanar, multiecho pulse sequences of the brain and surrounding structures were obtained without intravenous contrast. COMPARISON:  11/18/2017 CT head FINDINGS: Brain: No acute infarction, hemorrhage, hydrocephalus, extra-axial collection or mass lesion. Chronic right perisylvian infarct involving the frontal operculum and insula. Very small chronic infarction in the left anterolateral frontal cortex. Small chronic lacunar infarction within right thalamus. Several nonspecific foci of T2 FLAIR hyperintense signal abnormality in subcortical and periventricular white matter are compatible with moderate chronic microvascular ischemic changes for age. Moderate brain parenchymal volume loss. Small focus of susceptibility hypointensity and low T2  signal in the right cerebellar hemisphere compatible with hemosiderin deposition of chronic microhemorrhage. Vascular: Normal flow voids. Skull and upper cervical spine: Normal marrow signal. Sinuses/Orbits: Extensive paranasal sinus mucosal thickening and patchy opacification. No abnormal signal of mastoid air cells. Orbits partially obscured by susceptibility artifact from dental hardware. Visualized portions of the orbits are unremarkable. Other: None. IMPRESSION: 1. No acute intracranial abnormality identified. 2. Moderate chronic microvascular ischemic changes and volume loss of the brain. 3. 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 4. Extensive paranasal sinus disease. Electronically Signed   By: Kristine Garbe M.D.   On: 11/19/2017 01:36   US Carotid Bilateral  Result Date: 11/19/2017 CLINICAL DATA:  CVA.  TIA.  History of hypertension. EXAM: BILATERAL CAROTID DUPLEX ULTRASOUND TECHNIQUE: Pearline Cables scale imaging, color Doppler and duplex ultrasound were performed of bilateral carotid and vertebral arteries in the neck. COMPARISON:  None. FINDINGS: Criteria: Quantification of carotid stenosis is based on velocity parameters that correlate the residual internal carotid diameter with NASCET-based stenosis levels, using the diameter of the distal internal carotid lumen as the denominator for stenosis measurement. The following velocity measurements were obtained: RIGHT ICA:  72/30 cm/sec CCA:  82/42 cm/sec SYSTOLIC ICA/CCA RATIO:  0.8 ECA:  71 cm/sec LEFT ICA:  93/26 cm/sec CCA:  35/36 cm/sec SYSTOLIC ICA/CCA RATIO:  1.0 ECA:  62 cm/sec RIGHT CAROTID ARTERY: There is mild circumferential intimal wall thickening involving the mid (image 7) and distal (image 11) aspects of the right common carotid artery. There is a minimal amount of intimal thickening involving the right carotid bulb (image 16). There is a minimal  amount of echogenic plaque involving the origin and proximal aspects of the  right internal carotid artery (image 24), not resulting in elevated peak systolic velocities within the interrogated course the right internal carotid artery to suggest a hemodynamically significant stenosis. RIGHT VERTEBRAL ARTERY:  Antegrade flow LEFT CAROTID ARTERY: There is a minimal amount of circumferential wall thickening involving the mid (image 40) and distal (image 44) aspects of the left common carotid artery. There is a minimal amount of mixed echogenic plaque involving the origin and proximal aspects of the left internal carotid artery (image 58), not resulting in elevated peak systolic velocities within the interrogated course the left internal carotid artery to suggest a hemodynamically significant stenosis. LEFT VERTEBRAL ARTERY:  Antegrade flow IMPRESSION: Minimal amount of bilateral atherosclerotic plaque, right subjectively greater than left, not resulting in a hemodynamically significant stenosis within either internal carotid artery. Electronically Signed   By: Sandi Mariscal M.D.   On: 11/19/2017 10:48    Assessment: 77 y.o. male with a history of CVA presenting with an aphasia that resolved.  Patient now at baseline.  TIA presumed.  MRI of the brain performed and shows no acute changes but does show evidence of chronic small vessel ischemic changes.  Patient on no antiplatelet therapy prior to admission.  Carotid dopplers show no evidence of hemodynamically significant stenosis.  Echocardiogram pending.  A1c 5.7, LDL 88.  Stroke Risk Factors - hypertension  Plan: 1. EEG if recurrent episodes 2. Echocardiogram pending 3. Statin for lipid management with target LDL<70. 4. Prophylactic therapy-Antiplatelet med: Aspirin - dose 325mg  daily 5. Telemetry monitoring 6. Frequent neuro checks 7. BP control   Alexis Goodell, MD Neurology [phone] 11/19/2017, 11:22 AM

## 2017-11-19 NOTE — Progress Notes (Signed)
*  PRELIMINARY RESULTS* Echocardiogram 2D Echocardiogram has been performed.  Charles Daniel [name] 11/19/2017, 8:40 AM

## 2017-11-19 NOTE — Care Management Obs Status (Signed)
[facility] NOTIFICATION   Patient Details  Name: Charles Daniel MRN: [id number] Date of Birth: [birth year]   Medicare Observation Status Notification Given:  No Discharge order placed in < 24hr of being placed on observation    Katrina Stack, RN 11/19/2017, 1:55 PM

## 2017-11-19 NOTE — Plan of Care (Signed)
Pt NIH=0. Pt does not have deficits currently. No signs of distress noted. Will continue to monitor.

## 2017-11-19 NOTE — Progress Notes (Signed)
Pt is being discharged home. Discharge papers given and explained to pt, verbalized understanding. Meds and f/u appointments reviewed. RX given. Awaiting transportation. 

## 2017-11-19 NOTE — Discharge Summary (Signed)
[facility] [place] NAME: Charles Daniel    MR#:  [id number]  DATE OF BIRTH:  19-Mar-1941  DATE OF ADMISSION:  11/18/2017 ADMITTING PHYSICIAN: Henreitta Leber, MD  DATE OF DISCHARGE: 11/19/2017  PRIMARY CARE PHYSICIAN: [name], MD   ADMISSION DIAGNOSIS:  TIA (transient ischemic attack) [G45.9] Hypertension HIV disease DISCHARGE DIAGNOSIS:  Active Problems:   TIA (transient ischemic attack) Acute severe hypokalemia HIV disease  hypertension   SECONDARY DIAGNOSIS:   Past Medical History:  Diagnosis Date  . Bilateral hydrocele 2012  . Depression   . Diverticulosis 2013   by colonoscopy  . Emphysema lung ([facility]) 03/2014    by CXR, remote smoking history  . History of colon cancer    2007 --  S/P RECTOSIGMOID COLECTOMY--  NO CHEMORADIATION--  NO RECURRENCE  . History of CVA (cerebrovascular accident)    2003-  RIGHT MIDDLE CVA---   NO RESIDUAL  . History of hepatitis B    REMOTE AND INACTIVE  PER DOCUMENTATION  . History of syphilis    SECONDARY SYPHILITIS TX'D IN 1998  PER DOCUMENTATION  . History of tuberculosis    LATENT TB  TX'D X12  MONTHS IN 1995  . HIV infection ([place])    Agency (DR CAMPBELL)  . Hypertension   . Prostate carcinoma [facility]) dx 09/2012   T1c, brachytherapy/seed implant [facility] [place])     ADMITTING HISTORY Charles Daniel  is a 77 y.o. male with a known history of HIV, COPD, depression, diverticulosis, essential hypertension, history of prostate cancer who presents to the hospital due to slurred speech/aphasia.  Patient his usual state of health until he came home and his family noticed that he was not talking right.  As per the family they would ask him a question and he would respond and the answer would not make any sense.  They also asked him to identify the people in the house and he could not identify them, and he could not remember his own name.  Family then  called EMS and he was brought to the hospital.  As per the family did not notice any facial asymmetry, any focal weakness that he complained of.  Patient does not recall the events very well.  There has been no seizure type activity, incontinence.  He denies any headache, blurry vision or any focal weakness or numbness or tingling.  Hospitalist services were contacted for admission.  HOSPITAL COURSE:  Patient was admitted to medical floor and worked up for stroke.  Seen by neurology attending during hospitalization.  Patient mental status is back to baseline.  His speech improved and completely back to baseline.  No complaints of any numbness, weakness in any part of the body.  He was worked up with CT head, MRI brain which did not show any acute abnormality.  Carotid ultrasound did not reveal any significant stenosis.  Echocardiogram EF 60 to 65% no evidence of any emboli source and wall motion normal. His potassium was low and was supplemented aggressively potassium at the time of discharge 3.7.  According to the recommendations of neurology attending patient will be discharged on aspirin, statin medication with optimal blood pressure medications.  Patient hemodynamically stable will be discharged home.  CONSULTS OBTAINED:  Treatment Team:  Catarina Hartshorn, MD  DRUG ALLERGIES:  No Known Allergies  DISCHARGE MEDICATIONS:   Allergies as of 11/19/2017   No Known Allergies     Medication  List    TAKE these medications   aspirin 325 MG EC tablet Take 1 tablet (325 mg total) by mouth daily. Start taking on:  11/20/2017   atorvastatin 40 MG tablet Commonly known as:  LIPITOR Take 1 tablet (40 mg total) by mouth daily.   elvitegravir-cobicistat-emtricitabine-tenofovir 150-150-200-10 MG Tabs tablet Commonly known as:  GENVOYA Take 1 tablet by mouth daily with breakfast.   hydrochlorothiazide 25 MG tablet Commonly known as:  HYDRODIURIL Take 1 tablet (25 mg total) by mouth daily.    lisinopril 20 MG tablet Commonly known as:  PRINIVIL,ZESTRIL Take 1 tablet (20 mg total) by mouth daily.   potassium chloride 10 MEQ tablet Commonly known as:  K-DUR Take 1 tablet (10 mEq total) by mouth daily.       Today  Patient seen and evaluated today  speech back to baseline No weakness or numbness in any part of the body   VITAL SIGNS:  Blood pressure (!) 166/88, pulse 70, temperature 98.3 F (36.8 C), temperature source Oral, resp. rate 18, height 5\' 6"  (1.676 m), weight 70.8 kg (156 lb), SpO2 99 %.  I/O:    Intake/Output Summary (Last 24 hours) at 11/19/2017 1432 Last data filed at 11/19/2017 1341 Gross per 24 hour  Intake 480 ml  Output 175 ml  Net 305 ml    PHYSICAL EXAMINATION:  Physical Exam  GENERAL:  77 y.o.-year-old patient lying in the bed with no acute distress.  LUNGS: Normal breath sounds bilaterally, no wheezing, rales,rhonchi or crepitation. No use of accessory muscles of respiration.  CARDIOVASCULAR: S1, S2 normal. No murmurs, rubs, or gallops.  ABDOMEN: Soft, non-tender, non-distended. Bowel sounds present. No organomegaly or mass.  NEUROLOGIC: Moves all 4 extremities. PSYCHIATRIC: The patient is alert and oriented x 3.  SKIN: No obvious rash, lesion, or ulcer.   DATA REVIEW:   CBC Recent Labs  Lab 11/18/17 1757  WBC 9.0  HGB 13.4  HCT 40.2  PLT 203    Chemistries  Recent Labs  Lab 11/18/17 1757 11/19/17 0402 11/19/17 1317  NA 140 140  --   K 2.9* 2.8* 3.7  CL 103 107  --   CO2 28 29  --   GLUCOSE 101* 106*  --   BUN 9 10  --   CREATININE 1.15 1.07  --   CALCIUM 9.0 8.6*  --   MG 2.0  --   --   AST 19  --   --   ALT 15  --   --   ALKPHOS 77  --   --   BILITOT 0.7  --   --     Cardiac Enzymes Recent Labs  Lab 11/18/17 1757  TROPONINI <0.03    Microbiology Results  Results for orders placed or performed during the hospital encounter of 03/19/16  Blood Culture (routine x 2)     Status: None   Collection Time:  03/19/16  2:32 PM  Result Value Ref Range Status   Specimen Description BLOOD LEFT ARM  Final   Special Requests BOTTLES DRAWN AEROBIC AND ANAEROBIC 5CC  Final   Culture   Final    NO GROWTH 5 DAYS Performed at [facility]    Report Status 03/24/2016 FINAL  Final  Blood Culture (routine x 2)     Status: None   Collection Time: 03/19/16  2:42 PM  Result Value Ref Range Status   Specimen Description BLOOD RIGHT ARM  Final   Special Requests BOTTLES DRAWN  AEROBIC AND ANAEROBIC 5CC  Final   Culture   Final    NO GROWTH 5 DAYS Performed at [facility]    Report Status 03/24/2016 FINAL  Final  Respiratory Panel by PCR     Status: None   Collection Time: 03/19/16  6:26 PM  Result Value Ref Range Status   Adenovirus NOT DETECTED NOT DETECTED Final   Coronavirus 229E NOT DETECTED NOT DETECTED Final   Coronavirus HKU1 NOT DETECTED NOT DETECTED Final   Coronavirus NL63 NOT DETECTED NOT DETECTED Final   Coronavirus OC43 NOT DETECTED NOT DETECTED Final   Metapneumovirus NOT DETECTED NOT DETECTED Final   Rhinovirus / Enterovirus NOT DETECTED NOT DETECTED Final   Influenza A NOT DETECTED NOT DETECTED Final   Influenza B NOT DETECTED NOT DETECTED Final   Parainfluenza Virus 1 NOT DETECTED NOT DETECTED Final   Parainfluenza Virus 2 NOT DETECTED NOT DETECTED Final   Parainfluenza Virus 3 NOT DETECTED NOT DETECTED Final   Parainfluenza Virus 4 NOT DETECTED NOT DETECTED Final   Respiratory Syncytial Virus NOT DETECTED NOT DETECTED Final   Bordetella pertussis NOT DETECTED NOT DETECTED Final   Chlamydophila pneumoniae NOT DETECTED NOT DETECTED Final   Mycoplasma pneumoniae NOT DETECTED NOT DETECTED Final    Comment: Performed at [facility]  MRSA PCR Screening     Status: None   Collection Time: 03/19/16  6:26 PM  Result Value Ref Range Status   MRSA by PCR NEGATIVE NEGATIVE Final    Comment:        The GeneXpert MRSA Assay (FDA approved for NASAL specimens only), is  one component of a comprehensive MRSA colonization surveillance program. It is not intended to diagnose MRSA infection nor to guide or monitor treatment for MRSA infections.   Urine culture     Status: None   Collection Time: 03/19/16  7:19 PM  Result Value Ref Range Status   Specimen Description URINE, RANDOM  Final   Special Requests NONE  Final   Culture NO GROWTH Performed at [facility]   Final   Report Status 03/21/2016 FINAL  Final  Culture, expectorated sputum-assessment     Status: None   Collection Time: 03/20/16 11:36 AM  Result Value Ref Range Status   Specimen Description SPUTUM  Final   Special Requests NONE  Final   Sputum evaluation   Final    MICROSCOPIC FINDINGS SUGGEST THAT THIS SPECIMEN IS NOT REPRESENTATIVE OF LOWER RESPIRATORY SECRETIONS. PLEASE RECOLLECT. INFORMED HAULT, B. RN @1151  ON 11.17.17 BY NMCCOY    Report Status 03/20/2016 FINAL  Final  Culture, expectorated sputum-assessment     Status: None   Collection Time: 03/21/16  4:10 AM  Result Value Ref Range Status   Specimen Description SPUTUM  Final   Special Requests NONE  Final   Sputum evaluation   Final    MICROSCOPIC FINDINGS SUGGEST THAT THIS SPECIMEN IS NOT REPRESENTATIVE OF LOWER RESPIRATORY SECRETIONS. PLEASE RECOLLECT. RESULTS CALLED TO, READ BACK BY AND VERIFIED WITH MELISSA Vickki Muff [id number] @ 7026 BY J SCOTTON    Report Status 03/21/2016 FINAL  Final  Culture, expectorated sputum-assessment     Status: None   Collection Time: 03/21/16 11:45 AM  Result Value Ref Range Status   Specimen Description SPUTUM  Final   Special Requests Normal  Final   Sputum evaluation   Final    THIS SPECIMEN IS ACCEPTABLE. RESPIRATORY CULTURE REPORT TO FOLLOW.   Report Status 03/21/2016 FINAL  Final  Culture,  respiratory (NON-Expectorated)     Status: None   Collection Time: 03/21/16 11:45 AM  Result Value Ref Range Status   Specimen Description SPUTUM  Final   Special Requests NONE  Final    Gram Stain   Final    MODERATE WBC PRESENT,BOTH PMN AND MONONUCLEAR MODERATE SQUAMOUS EPITHELIAL CELLS PRESENT FEW YEAST FEW GRAM POSITIVE RODS RARE GRAM POSITIVE COCCI    Culture   Final    Consistent with normal respiratory flora. Performed at [facility]    Report Status 03/24/2016 FINAL  Final    RADIOLOGY:  Ct Head Wo Contrast  Result Date: 11/18/2017 CLINICAL DATA:  Altered mental status and disorientation x4. Prior stroke 5 years ago. EXAM: CT HEAD WITHOUT CONTRAST TECHNIQUE: Contiguous axial images were obtained from the base of the skull through the vertex without intravenous contrast. COMPARISON:  None. FINDINGS: Brain: Chronic appearing mild-to-moderate small vessel ischemic disease of periventricular white matter more so on the right. Metallic streak artifacts from metallic foreign bodies within the left frontoparietal scalp limit assessment of the left cerebral hemisphere. Age related involutional changes of the brain. Midline fourth ventricle and basal cisterns without effacement. No intra-axial mass nor extra-axial fluid collections. Small right thalamic lacunar infarct likely chronic. Vascular: Hyperdense vessel signs. Skull: No acute skull fracture. Sinuses/Orbits: Moderate ethmoid and marked sphenoid sinus mucosal opacification. Under pneumatized frontal sinus. The maxillary sinuses are excluded on this study. The included orbits appear symmetric. No retrobulbar abnormality is seen. Other: Scattered punctate metallic foreign bodies in the left frontal parietal scalp the largest overlying the left parietal skull. IMPRESSION: 1. Metallic streak artifacts from left frontoparietal metallic scalp foreign bodies limit assessment of the left cerebral hemisphere. 2. Mild-to-moderate chronic appearing small vessel ischemic disease with likely chronic right small thalamic lacunar infarct. 3. No acute intracranial abnormality is identified. 4. Mucosal thickening of the ethmoid and  sphenoid sinuses as above. Electronically Signed   By: Ashley Royalty M.D.   On: 11/18/2017 18:37   Mr Brain Wo Contrast  Result Date: 11/19/2017 CLINICAL DATA:  77 y/o  M; speech difficulty. EXAM: MRI HEAD WITHOUT CONTRAST TECHNIQUE: Multiplanar, multiecho pulse sequences of the brain and surrounding structures were obtained without intravenous contrast. COMPARISON:  11/18/2017 CT head FINDINGS: Brain: No acute infarction, hemorrhage, hydrocephalus, extra-axial collection or mass lesion. Chronic right perisylvian infarct involving the frontal operculum and insula. Very small chronic infarction in the left anterolateral frontal cortex. Small chronic lacunar infarction within right thalamus. Several nonspecific foci of T2 FLAIR hyperintense signal abnormality in subcortical and periventricular white matter are compatible with moderate chronic microvascular ischemic changes for age. Moderate brain parenchymal volume loss. Small focus of susceptibility hypointensity and low T2 signal in the right cerebellar hemisphere compatible with hemosiderin deposition of chronic microhemorrhage. Vascular: Normal flow voids. Skull and upper cervical spine: Normal marrow signal. Sinuses/Orbits: Extensive paranasal sinus mucosal thickening and patchy opacification. No abnormal signal of mastoid air cells. Orbits partially obscured by susceptibility artifact from dental hardware. Visualized portions of the orbits are unremarkable. Other: None. IMPRESSION: 1. No acute intracranial abnormality identified. 2. Moderate chronic microvascular ischemic changes and volume loss of the brain. 3. 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 4. Extensive paranasal sinus disease. Electronically Signed   By: Kristine Garbe M.D.   On: 11/19/2017 01:36   US Carotid Bilateral  Result Date: 11/19/2017 CLINICAL DATA:  CVA.  TIA.  History of hypertension. EXAM: BILATERAL CAROTID DUPLEX ULTRASOUND TECHNIQUE: Pearline Cables scale imaging,  color Doppler and duplex ultrasound were performed of bilateral carotid and vertebral arteries in the neck. COMPARISON:  None. FINDINGS: Criteria: Quantification of carotid stenosis is based on velocity parameters that correlate the residual internal carotid diameter with NASCET-based stenosis levels, using the diameter of the distal internal carotid lumen as the denominator for stenosis measurement. The following velocity measurements were obtained: RIGHT ICA:  72/30 cm/sec CCA:  00/76 cm/sec SYSTOLIC ICA/CCA RATIO:  0.8 ECA:  71 cm/sec LEFT ICA:  93/26 cm/sec CCA:  22/63 cm/sec SYSTOLIC ICA/CCA RATIO:  1.0 ECA:  62 cm/sec RIGHT CAROTID ARTERY: There is mild circumferential intimal wall thickening involving the mid (image 7) and distal (image 11) aspects of the right common carotid artery. There is a minimal amount of intimal thickening involving the right carotid bulb (image 16). There is a minimal amount of echogenic plaque involving the origin and proximal aspects of the right internal carotid artery (image 24), not resulting in elevated peak systolic velocities within the interrogated course the right internal carotid artery to suggest a hemodynamically significant stenosis. RIGHT VERTEBRAL ARTERY:  Antegrade flow LEFT CAROTID ARTERY: There is a minimal amount of circumferential wall thickening involving the mid (image 40) and distal (image 44) aspects of the left common carotid artery. There is a minimal amount of mixed echogenic plaque involving the origin and proximal aspects of the left internal carotid artery (image 58), not resulting in elevated peak systolic velocities within the interrogated course the left internal carotid artery to suggest a hemodynamically significant stenosis. LEFT VERTEBRAL ARTERY:  Antegrade flow IMPRESSION: Minimal amount of bilateral atherosclerotic plaque, right subjectively greater than left, not resulting in a hemodynamically significant stenosis within either internal  carotid artery. Electronically Signed   By: Sandi Mariscal M.D.   On: 11/19/2017 10:48    Follow up with PCP in 1 week.  Management plans discussed with the patient, family and they are in agreement.  CODE STATUS: Full code    Code Status Orders  (From admission, onward)        Start     Ordered   11/19/17 0336  Full code  Continuous     11/19/17 0336    Code Status History    Date Active Date Inactive Code Status Order ID Comments User Context   11/18/2017 2059 11/19/2017 0336 Full Code [id number]  Henreitta Leber, MD Inpatient   03/19/2016 1818 03/22/2016 1725 Full Code [id number]  Florencia Reasons, MD Inpatient      TOTAL TIME TAKING CARE OF THIS PATIENT ON DAY OF DISCHARGE: more than 34 minutes.   Saundra Shelling M.D on 11/19/2017 at 2:32 PM  Between 7am to 6pm - Pager - [phone]  After 6pm go to [email or web address] - password [email or web address] [place]  Office  [phone]  CC: Primary care physician; [name], MD  Note: This dictation was prepared with Dragon dictation along with smaller phrase technology. Any transcriptional errors that result from this process are unintentional.

## 2017-11-20 NOTE — Progress Notes (Signed)
Advanced care plan.  Purpose of the Encounter: CODE STATUS  Parties in Attendance:Patient  Patient's Decision Capacity:Good  Subjective/Patient's story: Presented with slurred speech and facial droop   Objective/Medical story Admitted for stroke work up   Goals of care determination:  Advance care directives and goals of care discussed Patient wants everything done which includes cpr and intubation if need arises   CODE STATUS: Full code   Time spent discussing advanced care planning: 16 minutes

## 2017-11-22 ENCOUNTER — Telehealth: Payer: Self-pay | Admitting: *Deleted

## 2017-11-22 NOTE — Telephone Encounter (Signed)
Attempted to contact pt to complete TCM; unable to leave vm 

## 2017-11-24 ENCOUNTER — Other Ambulatory Visit: Payer: Self-pay | Admitting: Internal Medicine

## 2017-11-24 DIAGNOSIS — B20 Human immunodeficiency virus [HIV] disease: Secondary | ICD-10-CM

## 2017-11-25 MED FILL — GENVOYA TABLET: 150-150-200 | 30 days supply | Qty: 30 | Fill #0

## 2017-11-30 ENCOUNTER — Ambulatory Visit (INDEPENDENT_AMBULATORY_CARE_PROVIDER_SITE_OTHER): Payer: Medicare Other | Admitting: Internal Medicine

## 2017-11-30 ENCOUNTER — Telehealth: Payer: Self-pay | Admitting: Family Medicine

## 2017-11-30 DIAGNOSIS — G459 Transient cerebral ischemic attack, unspecified: Secondary | ICD-10-CM

## 2017-11-30 DIAGNOSIS — I639 Cerebral infarction, unspecified: Secondary | ICD-10-CM

## 2017-11-30 DIAGNOSIS — F528 Other sexual dysfunction not due to a substance or known physiological condition: Secondary | ICD-10-CM

## 2017-11-30 DIAGNOSIS — B20 Human immunodeficiency virus [HIV] disease: Secondary | ICD-10-CM | POA: Diagnosis not present

## 2017-11-30 MED ORDER — SILDENAFIL CITRATE 50 MG PO TABS: 50.0000 mg | ORAL_TABLET | Freq: Every day | ORAL | 11 refills | Status: DC | PRN

## 2017-11-30 NOTE — Telephone Encounter (Signed)
Copied from [place] [phone]. Topic: Quick Communication - See Telephone Encounter >> Nov 30, 2017  4:07 PM Neva Seat wrote: Pt is needing a note to go back to work stating he is ok to return to work.   Please call pt to let him know if it can be done and when he can come by to pick up the return to work note.

## 2017-11-30 NOTE — Telephone Encounter (Signed)
I spoke with Charles Daniel; pt was seen

## 2017-11-30 NOTE — Assessment & Plan Note (Signed)
His infection remains under excellent, long-term control.  He will continue Genvoya and follow-up after lab work in 6 months. 

## 2017-11-30 NOTE — Telephone Encounter (Signed)
Pt saw Dr Megan Salon today but was told by a nurse that return to work note had to be done by PCP. Pt wanted to return to work on 12/01/17 but pt does not have [place] until 12/07/17 at 3:30. FYI to DR G.

## 2017-11-30 NOTE — Assessment & Plan Note (Signed)
He is struggling with erectile dysfunction.  I agree to give him a prescription for sildenafil.

## 2017-11-30 NOTE — Progress Notes (Signed)
Patient Active Problem List   Diagnosis Date Noted  . History of rectal cancer 04/22/2010    Priority: High  . Human immunodeficiency virus (HIV) disease ([place]) 05/15/2006    Priority: High  . Essential hypertension 05/15/2006    Priority: High  . Cerebral artery occlusion with cerebral infarction ([place]) 05/15/2006    Priority: High  . TIA (transient ischemic attack) 11/18/2017  . Hypokalemia 03/20/2016  . COPD exacerbation ([place]) 10/18/2015  . COPD with emphysema ([place]) 10/18/2015  . Advanced care planning/counseling discussion 06/26/2014  . 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 Prostate CA ([place]) 11/08/2012  . Medicare annual wellness visit, subsequent 08/02/2012  . 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 WEIGHT LOSS 12/18/2008  . CONSTIPATION 03/16/2008  . TUBERCULOSIS 05/15/2006  . SYPHILIS 05/15/2006  . 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  . Ex-smoker 05/15/2006  . HEPATITIS B, HX OF 05/15/2006  . GASTROINTESTINAL HEMORRHAGE, HX OF 05/15/2006    Patient's Medications  New Prescriptions   SILDENAFIL (VIAGRA) 50 MG TABLET    Take 1 tablet (50 mg total) by mouth daily as needed for erectile dysfunction.  Previous Medications   ASPIRIN EC 325 MG EC TABLET    Take 1 tablet (325 mg total) by mouth daily.   ATORVASTATIN (LIPITOR) 40 MG TABLET    Take 1 tablet (40 mg total) by mouth daily.   GENVOYA 150-150-200-10 MG TABS TABLET    TAKE 1 TABLET BY MOUTH DAILY WITH BREAKFAST.   HYDROCHLOROTHIAZIDE (HYDRODIURIL) 25 MG TABLET    Take 1 tablet (25 mg total) by mouth daily.   LISINOPRIL (PRINIVIL,ZESTRIL) 20 MG TABLET    Take 1 tablet (20 mg total) by mouth daily.   POTASSIUM CHLORIDE (K-DUR) 10 MEQ TABLET    Take 1 tablet (10 mEq total) by mouth daily.  Modified Medications   No medications on file  Discontinued Medications   No medications on file    Subjective: Charles Daniel is in for his routine HIV follow-up visit.  He has had no problems  obtaining, taking or tolerating his Genvoya and never misses doses.  He was recently hospitalized after developing dizziness.  He was evaluated by neurology who was concerned about a transient ischemic attack.  He was started with aspirin and statin.  He did fill the prescriptions but does not know what those medications were for.  He has an appointment with his primary care provider next week.  He continues to take his blood pressure medication.  Review of Systems: Review of Systems  Constitutional: Negative for chills, diaphoresis, fever, malaise/fatigue and weight loss.  HENT: Negative for sore throat.   Respiratory: Negative for cough, sputum production and shortness of breath.   Cardiovascular: Negative for chest pain.  Gastrointestinal: Negative for abdominal pain, diarrhea, heartburn, nausea and vomiting.  Genitourinary: Negative for dysuria and frequency.  Musculoskeletal: Negative for joint pain and myalgias.  Skin: Negative for rash.  Neurological: Positive for dizziness. Negative for headaches.  Psychiatric/Behavioral: Negative for depression and substance abuse. The patient is not nervous/anxious.     Past Medical History:  Diagnosis Date  . Bilateral hydrocele 2012  . Depression   . Diverticulosis 2013   by colonoscopy  . Emphysema lung ([place]) 03/2014    by CXR, remote smoking history  . History of colon cancer    2007 --  S/P RECTOSIGMOID COLECTOMY--  NO CHEMORADIATION--  NO RECURRENCE  . History of CVA (cerebrovascular accident)    2003-  RIGHT MIDDLE CVA---   NO RESIDUAL  . History of hepatitis B    REMOTE AND INACTIVE  PER DOCUMENTATION  . History of syphilis    SECONDARY SYPHILITIS TX'D IN 1998  PER DOCUMENTATION  . History of tuberculosis    LATENT TB  TX'D X12  MONTHS IN 1995  . HIV infection ([place])    [place] (DR Brittania Sudbeck)  . Hypertension   . Prostate carcinoma [facility]) dx 09/2012   T1c, brachytherapy/seed implant Karsten Ro,  Tammi Klippel)    Social History   Tobacco Use  . Smoking status: Former Smoker    Packs/day: 0.30    Years: 35.00    Pack years: 10.50    Types: Cigarettes    Last attempt to quit: 03/04/2009    Years since quitting: 8.7  . Smokeless tobacco: Never Used  Substance Use Topics  . Alcohol use: No    Alcohol/week: 0.0 oz  . Drug use: No    Family History  Problem Relation Age of Onset  . Cancer Sister        breast  . Breast cancer Sister   . Cancer Brother 30       prostate, treated with seed implant  . Cancer Brother        prostate  . Cancer Brother        prostate  . Cancer Daughter        breast  . Cancer Other        prostate  . Cancer Father        unsure  . CAD Neg Hx   . Stroke Neg Hx   . Diabetes Neg Hx     No Known Allergies  Health Maintenance  Topic Date Due  . DTaP/Tdap/Td (1 - Tdap) 08/03/2022 (Originally 08/03/2012)  . INFLUENZA VACCINE  12/02/2017  . TETANUS/TDAP  08/03/2022  . PNA vac Low Risk Adult  Completed    Objective:  Vitals:   11/30/17 1355  BP: 137/89  Pulse: 84  Temp: 97.9 F (36.6 C)  TempSrc: Oral  Weight: 145 lb (65.8 kg)   Body mass index is 23.4 kg/m.  Physical Exam  Constitutional: He is oriented to person, place, and time.  He is in good spirits as usual.  HENT:  Mouth/Throat: No oropharyngeal exudate.  Eyes: Conjunctivae are normal.  Cardiovascular: Normal rate, regular rhythm and normal heart sounds.  No murmur heard. Pulmonary/Chest: Effort normal and breath sounds normal.  Abdominal: Soft. He exhibits no mass. There is no tenderness.  Musculoskeletal: Normal range of motion.  Neurological: He is alert and oriented to person, place, and time.  Skin: No rash noted.  Psychiatric: He has a normal mood and affect.    Lab Results Lab Results  Component Value Date   WBC 9.0 11/18/2017   HGB 13.4 11/18/2017   HCT 40.2 11/18/2017   MCV 85.8 11/18/2017   PLT 203 11/18/2017    Lab Results  Component Value Date    CREATININE 1.07 11/19/2017   BUN 10 11/19/2017   NA 140 11/19/2017   K 3.7 11/19/2017   CL 107 11/19/2017   CO2 29 11/19/2017    Lab Results  Component Value Date   ALT 15 11/18/2017   AST 19 11/18/2017   ALKPHOS 77 11/18/2017   BILITOT 0.7 11/18/2017    Lab Results  Component Value Date   CHOL 140 11/19/2017   HDL 35 (L) 11/19/2017   LDLCALC 88 11/19/2017  TRIG 85 11/19/2017   CHOLHDL 4.0 11/19/2017   Lab Results  Component Value Date   LABRPR REACTIVE (A) 11/09/2017   RPRTITER 1:1 (H) 11/09/2017   HIV 1 RNA Quant (copies/mL)  Date Value  11/09/2017 <20 DETECTED (A)  11/26/2016 <20 NOT DETECTED  11/22/2015 <20   CD4 T Cell Abs (/uL)  Date Value  11/09/2017 810  11/26/2016 770  11/22/2015 760     Problem List Items Addressed This Visit      High   Human immunodeficiency virus (HIV) disease ([place])    His infection remains under excellent, long-term control.  He will continue Genvoya and follow-up after lab work in 6 months.      Relevant Orders   CBC   T-helper cell (CD4)- ([facility] clinic only)   Comprehensive metabolic panel   RPR   HIV 1 RNA quant-no reflex-bld     Unprioritized   ERECTILE DYSFUNCTION    He is struggling with erectile dysfunction.  I agree to give him a prescription for sildenafil.      Relevant Medications   sildenafil (VIAGRA) 50 MG tablet   TIA (transient ischemic attack)    He was unaware that his hospital doctors were concerned that he had a transient ischemic attack or why he was started on new medications.  I talked to him about the importance of secondary prevention and trying to reduce risk factors for future neurologic events.      Relevant Medications   sildenafil (VIAGRA) 50 MG tablet        Michel Bickers, MD [facility] for Infectious [place] [phone] pager   [phone] cell 11/30/2017, 2:35 PM

## 2017-11-30 NOTE — Telephone Encounter (Signed)
Return to work letter written and in [employer]. Keep appt next week.

## 2017-11-30 NOTE — Assessment & Plan Note (Signed)
He was unaware that his hospital doctors were concerned that he had a transient ischemic attack or why he was started on new medications.  I talked to him about the importance of secondary prevention and trying to reduce risk factors for future neurologic events.

## 2017-12-01 NOTE — Telephone Encounter (Signed)
Spoke with pt notifying him his work note is ready to pick up.    [Placed note at front office.]

## 2017-12-07 ENCOUNTER — Ambulatory Visit: Payer: Medicare Other | Admitting: Family Medicine

## 2017-12-07 ENCOUNTER — Ambulatory Visit (INDEPENDENT_AMBULATORY_CARE_PROVIDER_SITE_OTHER): Payer: Medicare Other | Admitting: Family Medicine

## 2017-12-07 ENCOUNTER — Encounter: Payer: Self-pay | Admitting: Family Medicine

## 2017-12-07 VITALS — BP 138/70 | HR 75 | Temp 98.4°F | Ht 66.0 in | Wt 148.2 lb

## 2017-12-07 DIAGNOSIS — Z8673 Personal history of transient ischemic attack (TIA), and cerebral infarction without residual deficits: Secondary | ICD-10-CM

## 2017-12-07 DIAGNOSIS — I639 Cerebral infarction, unspecified: Secondary | ICD-10-CM

## 2017-12-07 DIAGNOSIS — G459 Transient cerebral ischemic attack, unspecified: Secondary | ICD-10-CM

## 2017-12-07 DIAGNOSIS — I10 Essential (primary) hypertension: Secondary | ICD-10-CM

## 2017-12-07 MED ORDER — LISINOPRIL 20 MG PO TABS: 20.0000 mg | ORAL_TABLET | Freq: Every day | ORAL | 3 refills | Status: DC

## 2017-12-07 MED ORDER — ATORVASTATIN CALCIUM 40 MG PO TABS: 40.0000 mg | ORAL_TABLET | Freq: Every day | ORAL | 3 refills | Status: DC

## 2017-12-07 MED ORDER — ASPIRIN 325 MG PO TBEC: 325.0000 mg | DELAYED_RELEASE_TABLET | Freq: Every day | ORAL | 3 refills | Status: DC

## 2017-12-07 NOTE — Progress Notes (Signed)
BP 138/70 (BP Location: Left Arm, Patient Position: Sitting, Cuff Size: Normal)   Pulse 75   Temp 98.4 F (36.9 C) (Oral)   Ht 5\' 6"  (1.676 m)   Wt 148 lb 4 oz (67.2 kg)   SpO2 98%   BMI 23.93 kg/m    CC: hosp f/u visit Subjective:    Patient ID: Charles Daniel, male    DOB: 1940-06-04, 77 y.o.   MRN: [id number]  HPI: Aadin Gaut is a 77 y.o. male presenting on 12/07/2017 for Hospitalization Follow-up (Admitted to [facility] on 11/18/17, primary dx TIA.)   Recent hospitalization for dizziness, slurred speech and confusion after he came home from work. Records reviewed. Normal CT head, MRI brain, carotid US, echocardiogram. Found to have low potassium - repleted. Neurology consult - rec statin (atorvastatin 40mg  daily) and aspirin 325mg  daily and ensure good BP control (lisinopril 20mg  daily was started). Started on atorvastatin 40mg  daily as well - tolerating without myalgias.   MRI HEAD WITHOUT CONTRAST IMPRESSION: 1. No acute intracranial abnormality identified. 2. Moderate chronic microvascular ischemic changes and volume loss of the brain. 3. 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 4. Extensive paranasal sinus disease. Electronically Signed   By: Kristine Garbe M.D.   On: 11/19/2017 01:36  Echocardiogram Study Conclusions - Left ventricle: The cavity size was normal. There was mild   concentric hypertrophy. Systolic function was normal. The   estimated ejection fraction was in the range of 60% to 65%. Wall   motion was normal; there were no regional wall motion   abnormalities. Doppler parameters are consistent with abnormal   left ventricular relaxation (grade 1 diastolic dysfunction). - Left atrium: The atrium was normal in size. - Right ventricle: Systolic function was normal. - Atrial septum: A patent foramen ovale cannot be excluded. Saline   contrast bubble study not performed. - Pulmonary arteries: Systolic pressure was within the normal  range. Impressions: - No cardiac source of emboli was indentified. 11/19/2017  Bilateral Carotid duplex US IMPRESSION: Minimal amount of bilateral atherosclerotic plaque, right subjectively greater than left, not resulting in a hemodynamically significant stenosis within either internal carotid artery. Electronically Signed   By: Sandi Mariscal M.D.   On: 11/19/2017 10:48  DATE OF ADMISSION:  11/18/2017   DATE OF DISCHARGE: 11/19/2017 TCM call - not completed - unable to reach patient.  Discharge diagnosis: Active Problems:   TIA (transient ischemic attack) Acute severe hypokalemia HIV disease  hypertension  Relevant past medical, surgical, family and social history reviewed and updated as indicated. Interim medical history since our last visit reviewed. Allergies and medications reviewed and updated. Outpatient Medications Prior to Visit  Medication Sig Dispense Refill  . GENVOYA 150-150-200-10 MG TABS tablet TAKE 1 TABLET BY MOUTH DAILY WITH BREAKFAST. 30 tablet 7  . hydrochlorothiazide (HYDRODIURIL) 25 MG tablet Take 1 tablet (25 mg total) by mouth daily. 90 tablet 3  . potassium chloride (K-DUR) 10 MEQ tablet Take 1 tablet (10 mEq total) by mouth daily. 90 tablet 3  . sildenafil (VIAGRA) 50 MG tablet Take 1 tablet (50 mg total) by mouth daily as needed for erectile dysfunction. 10 tablet 11  . aspirin EC 325 MG EC tablet Take 1 tablet (325 mg total) by mouth daily. 30 tablet 0  . atorvastatin (LIPITOR) 40 MG tablet Take 1 tablet (40 mg total) by mouth daily. 30 tablet 0  . lisinopril (PRINIVIL,ZESTRIL) 20 MG tablet Take 1 tablet (20 mg total) by mouth daily. [place]  tablet 0   No facility-administered medications prior to visit.      Per HPI unless specifically indicated in ROS section below Review of Systems     Objective:    BP 138/70 (BP Location: Left Arm, Patient Position: Sitting, Cuff Size: Normal)   Pulse 75   Temp 98.4 F (36.9 C) (Oral)   Ht 5\' 6"  (1.676 m)   Wt  148 lb 4 oz (67.2 kg)   SpO2 98%   BMI 23.93 kg/m   Wt Readings from Last 3 Encounters:  12/07/17 148 lb 4 oz (67.2 kg)  11/30/17 145 lb (65.8 kg)  11/18/17 156 lb (70.8 kg)    Physical Exam  Constitutional: He appears well-developed and well-nourished. No distress.  HENT:  Mouth/Throat: Oropharynx is clear and moist. No oropharyngeal exudate.  Eyes: Pupils are equal, round, and reactive to light. Conjunctivae and EOM are normal.  Neck: Normal range of motion. Neck supple. Carotid bruit is not present. No thyromegaly present.  Cardiovascular: Normal rate, regular rhythm and normal heart sounds.  No murmur heard. Pulmonary/Chest: Effort normal and breath sounds normal. No respiratory distress. He has no wheezes. He has no rales.  Musculoskeletal: He exhibits no edema.  Neurological: He is alert. He has normal strength. No cranial nerve deficit or sensory deficit.  CN 2-12 intact FTN intact EOMI  Skin: Skin is warm.  Psychiatric: He has a normal mood and affect.  Nursing note and vitals reviewed.  Results for orders placed or performed in visit on 16/10/96  Basic metabolic panel  Result Value Ref Range   Sodium 140 135 - 145 mEq/L   Potassium 3.8 3.5 - 5.1 mEq/L   Chloride 99 96 - 112 mEq/L   CO2 34 (H) 19 - 32 mEq/L   Glucose, Bld 88 70 - 99 mg/dL   BUN 12 6 - 23 mg/dL   Creatinine, Ser 1.13 0.40 - 1.50 mg/dL   Calcium 9.4 8.4 - 10.5 mg/dL   GFR 80.78 >60.00 mL/min      Assessment & Plan:   Problem List Items Addressed This Visit    TIA (transient ischemic attack) - Primary    Recent dx, symptoms largely resolved, no neurological deficit.  Reviewed new regimen of lisinopril, aspirin, statin.  Will check Cr after starting lisinopril.  Questions answered.       Relevant Medications   aspirin 325 MG EC tablet   atorvastatin (LIPITOR) 40 MG tablet   lisinopril (PRINIVIL,ZESTRIL) 20 MG tablet   Other Relevant Orders   Basic metabolic panel (Completed)   History of  stroke without residual deficits   Essential hypertension    Chronic, stable. Continue new regimen of hctz and lisinopril. He is also on daily potassium. Check Cr.       Relevant Medications   aspirin 325 MG EC tablet   atorvastatin (LIPITOR) 40 MG tablet   lisinopril (PRINIVIL,ZESTRIL) 20 MG tablet       Meds ordered this encounter  Medications  . aspirin 325 MG EC tablet    Sig: Take 1 tablet (325 mg total) by mouth daily.    Dispense:  90 tablet    Refill:  3  . atorvastatin (LIPITOR) 40 MG tablet    Sig: Take 1 tablet (40 mg total) by mouth daily.    Dispense:  90 tablet    Refill:  3  . lisinopril (PRINIVIL,ZESTRIL) 20 MG tablet    Sig: Take 1 tablet (20 mg total) by mouth daily.  Dispense:  90 tablet    Refill:  3   Orders Placed This Encounter  Procedures  . Basic metabolic panel    Follow up plan: Return in about 10 months (around 10/08/2018) for medicare wellness visit, follow up visit.  [name], MD

## 2017-12-07 NOTE — Patient Instructions (Addendum)
You are doing well today. Check kidneys and potassium today.  Continue current medicines (refilled today).  Return after 09/29/2018 for physical and wellness visit

## 2017-12-08 LAB — BASIC METABOLIC PANEL
BUN: 12 mg/dL (ref 6–23)
CO2: 34 mEq/L — ABNORMAL HIGH (ref 19–32)
CREATININE: 1.13 mg/dL (ref 0.40–1.50)
Calcium: 9.4 mg/dL (ref 8.4–10.5)
Chloride: 99 mEq/L (ref 96–112)
GFR: 80.78 mL/min (ref >60.00)
Glucose, Bld: 88 mg/dL (ref 70–99)
POTASSIUM: 3.8 meq/L (ref 3.5–5.1)
Sodium: 140 mEq/L (ref 135–145)

## 2017-12-10 NOTE — Assessment & Plan Note (Signed)
Chronic, stable. Continue new regimen of hctz and lisinopril. He is also on daily potassium. Check Cr.

## 2017-12-10 NOTE — Assessment & Plan Note (Signed)
Recent dx, symptoms largely resolved, no neurological deficit.  Reviewed new regimen of lisinopril, aspirin, statin.  Will check Cr after starting lisinopril.  Questions answered.

## 2017-12-21 MED FILL — GENVOYA TABLET: 150-150-200 | 30 days supply | Qty: 30 | Fill #1

## 2018-01-19 MED FILL — GENVOYA TABLET: 150-150-200 | 30 days supply | Qty: 30 | Fill #2

## 2018-02-08 DIAGNOSIS — N401 Enlarged prostate with lower urinary tract symptoms: Secondary | ICD-10-CM | POA: Diagnosis not present

## 2018-02-14 ENCOUNTER — Telehealth: Payer: Self-pay | Admitting: *Deleted

## 2018-02-14 MED FILL — GENVOYA TABLET: 150-150-200 | 30 days supply | Qty: 30 | Fill #3

## 2018-02-14 NOTE — Telephone Encounter (Signed)
Patient called, stating he was returning a call from our clinic. RN unable to find who called patient or why.  Landis Gandy, RN

## 2018-02-16 ENCOUNTER — Encounter: Payer: Self-pay | Admitting: Internal Medicine

## 2018-02-16 ENCOUNTER — Ambulatory Visit (INDEPENDENT_AMBULATORY_CARE_PROVIDER_SITE_OTHER): Payer: Medicare Other | Admitting: Internal Medicine

## 2018-02-16 ENCOUNTER — Ambulatory Visit (INDEPENDENT_AMBULATORY_CARE_PROVIDER_SITE_OTHER)
Admission: RE | Admit: 2018-02-16 | Discharge: 2018-02-16 | Disposition: A | Discharge status: Home / Self Care | Payer: Medicare Other | Source: Ambulatory Visit | Attending: Internal Medicine | Admitting: Internal Medicine

## 2018-02-16 VITALS — BP 130/76 | HR 77 | Temp 98.3°F | Wt 148.0 lb

## 2018-02-16 DIAGNOSIS — R059 Cough, unspecified: Secondary | ICD-10-CM

## 2018-02-16 DIAGNOSIS — R05 Cough: Secondary | ICD-10-CM | POA: Diagnosis not present

## 2018-02-16 DIAGNOSIS — R0981 Nasal congestion: Secondary | ICD-10-CM | POA: Diagnosis not present

## 2018-02-16 DIAGNOSIS — J449 Chronic obstructive pulmonary disease, unspecified: Secondary | ICD-10-CM

## 2018-02-16 DIAGNOSIS — I639 Cerebral infarction, unspecified: Secondary | ICD-10-CM | POA: Diagnosis not present

## 2018-02-16 MED ORDER — HYDROCODONE-HOMATROPINE 5-1.5 MG/5ML PO SYRP: 5.0000 mL | ORAL_SOLUTION | Freq: Three times a day (TID) | ORAL | 0 refills | Status: DC | PRN

## 2018-02-16 NOTE — Patient Instructions (Signed)

## 2018-02-16 NOTE — Progress Notes (Signed)
HPI  Pt presents to the clinic today with c/o nasal congestion and cough. He reports this started 10 days ago. He is blowing clear mucous out of his nose. The cough is productive of clear/brown mucous. He denies ear pain, runny nose, sore throat. He denies fever, chills or body aches. He has not tried any meds OTC. He has a history of COPD. He has not had sick contacts that he is aware of but he does work in a school.  Review of Systems      Past Medical History:  Diagnosis Date  . Bilateral hydrocele 2012  . Depression   . Diverticulosis 2013   by colonoscopy  . Emphysema lung ([place]) 03/2014    by CXR, remote smoking history  . History of colon cancer    2007 --  S/P RECTOSIGMOID COLECTOMY--  NO CHEMORADIATION--  NO RECURRENCE  . History of CVA (cerebrovascular accident)    2003-  RIGHT MIDDLE CVA---   NO RESIDUAL  . History of hepatitis B    REMOTE AND INACTIVE  PER DOCUMENTATION  . History of syphilis    SECONDARY SYPHILITIS TX'D IN 1998  PER DOCUMENTATION  . History of tuberculosis    LATENT TB  TX'D X12  MONTHS IN 1995  . HIV infection ([place])    [place] (DR CAMPBELL)  . Hypertension   . Prostate carcinoma [facility]) dx 09/2012   T1c, brachytherapy/seed implant Karsten Ro, [place])    Family History  Problem Relation Age of Onset  . Cancer Sister        breast  . Breast cancer Sister   . Cancer Brother 74       prostate, treated with seed implant  . Cancer Brother        prostate  . Cancer Brother        prostate  . Cancer Daughter        breast  . Cancer Other        prostate  . Cancer Father        unsure  . CAD Neg Hx   . Stroke Neg Hx   . Diabetes Neg Hx     Social History   Socioeconomic History  . Marital status: Married    Spouse name: Not on file  . Number of children: Not on file  . Years of education: Not on file  . Highest education level: Not on file  Occupational History  . Not on file  Social Needs  .  Financial resource strain: Not on file  . Food insecurity:    Worry: Not on file    Inability: Not on file  . Transportation needs:    Medical: Not on file    Non-medical: Not on file  Tobacco Use  . Smoking status: Former Smoker    Packs/day: 0.30    Years: 35.00    Pack years: 10.50    Types: Cigarettes    Last attempt to quit: 03/04/2009    Years since quitting: 8.9  . Smokeless tobacco: Never Used  Substance and Sexual Activity  . Alcohol use: No    Alcohol/week: 0.0 standard drinks  . Drug use: No  . Sexual activity: Not on file    Comment: declined condoms  Lifestyle  . Physical activity:    Days per week: Not on file    Minutes per session: Not on file  . Stress: Not on file  Relationships  . Social connections:  Talks on phone: Not on file    Gets together: Not on file    Attends religious service: Not on file    Active member of club or organization: Not on file    Attends meetings of clubs or organizations: Not on file    Relationship status: Not on file  . Intimate partner violence:    Fear of current or ex partner: Not on file    Emotionally abused: Not on file    Physically abused: Not on file    Forced sexual activity: Not on file  Other Topics Concern  . Not on file  Social History Narrative   Does not want ID status disclosed to family   Caffeine: 3 sodas/day   Lives with wife.  2 grown sons.   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  HEENT:  Positive nasal congestion. Denies eye redness, eye pain, pressure behind the eyes, facial pain, ear pain, ringing in the ears, wax buildup, runny nose or sore throat. Respiratory: Positive cough. Denies difficulty breathing  or shortness of breath.  Cardiovascular: Denies chest pain, chest tightness, palpitations or swelling in the hands or feet.   No other specific complaints in a complete review of systems (except as listed in HPI above).  Objective:   BP 130/76   Pulse 77   Temp 98.3 F (36.8 C) (Oral)   Wt 148 lb (67.1 kg)   SpO2 97%   BMI 23.89 kg/m  Wt Readings from Last 3 Encounters:  02/16/18 148 lb (67.1 kg)  12/07/17 148 lb 4 oz (67.2 kg)  11/30/17 145 lb (65.8 kg)     General: Appears his stated age, well developed, well nourished in NAD. HEENT: Head: normal shape and size, no sinus tenderness noted; Right Ear: unable to visualize TM, moderate cerumen impaction; Left Ear: Tm's gray and intact, normal light reflex; Nose: mucosa boggy and moist, septum midline; Throat/Mouth: Teeth present, mucosa pink and moist, no exudate noted, no lesions or ulcerations noted.  Neck: No cervical lymphadenopathy.  Cardiovascular: Normal rate and rhythm.  Pulmonary/Chest: Normal effort and diminished breath sounds. No respiratory distress. No wheezes, rales or ronchi noted.       Assessment & Plan:   Nasal Congestion, Cough, COPD:  Likely allergies Will obtain chest xray to r/o PNA Start Mucinex 600 mg every 12 hours x 3 days Start Flonase 1 spray each nostril daily x 1 week eRx for Hycodan cough syrup  RTC as needed or if symptoms persist.   Webb Silversmith, NP

## 2018-02-17 ENCOUNTER — Telehealth: Payer: Self-pay | Admitting: *Deleted

## 2018-02-17 NOTE — Telephone Encounter (Signed)
Note taken to Avie Echevaria NP. CXR in epic.

## 2018-02-17 NOTE — Telephone Encounter (Signed)
Chest xray results available in epic.

## 2018-02-22 ENCOUNTER — Telehealth: Payer: Self-pay

## 2018-02-22 NOTE — Telephone Encounter (Signed)
Called Charles Daniel to see how he was doing and ask about his adherence to [facility]. Patient states he is doing well and has not missed any doses. He was recently seen for a cold and prescribed fluticasone nasal spray. Patient was counseled on the interaction with fluticasone and Genvoya. Patient will stop fluticasone. He does not feel that he needs a nasal spray at this time. I let him know there are nasal sprays that do not interact if he needs future therapy. Patient thankful for the call and will call clinic if he starts any additional medications.   [name], Sherian Rein D PGY1 Pharmacy Resident  02/22/2018      4:59 PM

## 2018-03-14 MED FILL — GENVOYA TABLET: 150-150-200 | 30 days supply | Qty: 30 | Fill #4

## 2018-03-18 DIAGNOSIS — C61 Malignant neoplasm of prostate: Secondary | ICD-10-CM | POA: Diagnosis not present

## 2018-03-18 DIAGNOSIS — R9721 Rising PSA following treatment for malignant neoplasm of prostate: Secondary | ICD-10-CM | POA: Diagnosis not present

## 2018-04-07 MED FILL — GENVOYA TABLET: 150-150-200 | 30 days supply | Qty: 30 | Fill #5

## 2018-05-06 MED FILL — GENVOYA TABLET: 150-150-200 | 30 days supply | Qty: 30 | Fill #6

## 2018-06-02 MED FILL — GENVOYA TABLET: 150-150-200 | 30 days supply | Qty: 30 | Fill #7

## 2018-06-17 DIAGNOSIS — R972 Elevated prostate specific antigen [PSA]: Secondary | ICD-10-CM | POA: Diagnosis not present

## 2018-06-23 ENCOUNTER — Other Ambulatory Visit: Payer: Self-pay | Admitting: Internal Medicine

## 2018-06-23 DIAGNOSIS — B20 Human immunodeficiency virus [HIV] disease: Secondary | ICD-10-CM

## 2018-06-27 MED FILL — GENVOYA TABLET: 150-150-200 | 30 days supply | Qty: 30 | Fill #0

## 2018-07-04 ENCOUNTER — Ambulatory Visit: Payer: Self-pay | Admitting: Family Medicine

## 2018-07-04 NOTE — Telephone Encounter (Signed)
Soreness in left chest and shoulder.  Patient C/O pain when he breaths hard and moves .Patient states he is also coughing up mucous, but he didn't look at it to determine the color. / Patient states this started over a week ago.  Reason for Disposition . Chest pain(s) lasting a few seconds from coughing AND [2] persists > 3 days  Answer Assessment - Initial Assessment Questions 1. LOCATION: "Where does it hurt?"       Left chest and shoulder 2. RADIATION: "Does the pain go anywhere else?" (e.g., into neck, jaw, arms, back)    No 3. ONSET: "When did the chest pain begin?" (Minutes, hours or days)  Began last week  4. PATTERN "Does the pain come and go, or has it been constant since it started?"  "Does it get worse with exertion?"      Gets worse when he tries to pick up things/or moves 5. DURATION: "How long does it last" (e.g., seconds, minutes, hours)   constantly 6. SEVERITY: "How bad is the pain?"  (e.g., Scale 1-10; mild, moderate, or severe)    - MODERATE (4-7): interferes with normal activities or awakens from sleep    7. CARDIAC RISK FACTORS: "Do you have any history of heart problems or risk factors for heart disease?" (e.g., prior heart attack, angina; high blood pressure, diabetes, being overweight, high cholesterol, smoking, or strong family history of heart disease)    Yes 8. PULMONARY RISK FACTORS: "Do you have any history of lung disease?"  (e.g., blood clots in lung, asthma, emphysema, birth control pills)    no  10. OTHER SYMPTOMS: "Do you have any other symptoms?" (e.g., dizziness, nausea, vomiting, sweating, fever, difficulty breathing, cough)       no  Protocols used: CHEST PAIN-A-AH

## 2018-07-05 ENCOUNTER — Encounter: Payer: Self-pay | Admitting: Family Medicine

## 2018-07-05 ENCOUNTER — Ambulatory Visit (INDEPENDENT_AMBULATORY_CARE_PROVIDER_SITE_OTHER): Payer: Medicare Other | Admitting: Family Medicine

## 2018-07-05 ENCOUNTER — Ambulatory Visit (INDEPENDENT_AMBULATORY_CARE_PROVIDER_SITE_OTHER)
Admission: RE | Admit: 2018-07-05 | Discharge: 2018-07-05 | Disposition: A | Discharge status: Home / Self Care | Payer: Medicare Other | Source: Ambulatory Visit | Attending: Family Medicine | Admitting: Family Medicine

## 2018-07-05 VITALS — BP 130/80 | HR 78 | Temp 98.4°F | Ht 66.0 in | Wt 148.6 lb

## 2018-07-05 DIAGNOSIS — R079 Chest pain, unspecified: Secondary | ICD-10-CM

## 2018-07-05 DIAGNOSIS — I10 Essential (primary) hypertension: Secondary | ICD-10-CM

## 2018-07-05 DIAGNOSIS — Z23 Encounter for immunization: Secondary | ICD-10-CM

## 2018-07-05 MED ORDER — METHOCARBAMOL 500 MG PO TABS: 500.0000 mg | ORAL_TABLET | Freq: Two times a day (BID) | ORAL | 0 refills | Status: DC | PRN

## 2018-07-05 NOTE — Patient Instructions (Addendum)
Flu shot today Chest xray and EKG today  I think pain is coming from muscle strain. Treat with muscle relaxant sent to pharmacy. Use heating pad or heating blanket to left chest wall. May use salon pas patch over the counter.  Let me know if not better with this.

## 2018-07-05 NOTE — Progress Notes (Signed)
BP 130/80 (BP Location: Left Arm, Patient Position: Sitting, Cuff Size: Normal)   Pulse 78   Temp 98.4 F (36.9 C) (Oral)   Ht 5\' 6"  (1.676 m)   Wt 148 lb 9 oz (67.4 kg)   SpO2 97%   BMI 23.98 kg/m    CC: L chest and shoulder pain Subjective:    Patient ID: Charles Daniel, male    DOB: [birth year], 78 y.o.   MRN: [id number]  HPI: Charles Daniel is a 78 y.o. male presenting on 07/05/2018 for Shoulder Pain (C/o left shoulder pain. States the pain occurs with certain movements and can also feel pulling in left chest. Started last week. Denies injury to the area. )   "I think I had a stroke" 1 wk h/o L shoulder pain and L chest soreness with radiation to jaw. Pain described as sore ache. Worse with certain movements ie lifting something with left hand. No alleviating factors.   No pressure/tightness, dyspnea, nausea, dizziness, palpitations. No numbness or paresthesias of arms. No midline neck pain. Currently no chest pain. No fevers/chills, congestion, sneezing.   Rubbing shoulder with alcohol and asper cream.   Denies inciting trauma/injury or falls.   Hospitalized 11/2017 with TIA, started on aspirin 325mg  daily.      Relevant past medical, surgical, family and social history reviewed and updated as indicated. Interim medical history since our last visit reviewed. Allergies and medications reviewed and updated. Outpatient Medications Prior to Visit  Medication Sig Dispense Refill  . aspirin 325 MG EC tablet Take 1 tablet (325 mg total) by mouth daily. 90 tablet 3  . GENVOYA 150-150-200-10 MG TABS tablet TAKE 1 TABLET BY MOUTH DAILY WITH BREAKFAST. 30 tablet 5  . hydrochlorothiazide (HYDRODIURIL) 25 MG tablet Take 1 tablet (25 mg total) by mouth daily. 90 tablet 3  . HYDROcodone-homatropine (HYCODAN) 5-1.5 MG/5ML syrup Take 5 mLs by mouth every 8 (eight) hours as needed for cough. 120 mL 0  . potassium chloride (K-DUR) 10 MEQ tablet Take 1 tablet (10 mEq total) by mouth daily. 90  tablet 3  . atorvastatin (LIPITOR) 40 MG tablet Take 1 tablet (40 mg total) by mouth daily. 90 tablet 3  . lisinopril (PRINIVIL,ZESTRIL) 20 MG tablet Take 1 tablet (20 mg total) by mouth daily. 90 tablet 3  . sildenafil (VIAGRA) 50 MG tablet Take 1 tablet (50 mg total) by mouth daily as needed for erectile dysfunction. 10 tablet 11   No facility-administered medications prior to visit.      Per HPI unless specifically indicated in ROS section below Review of Systems Objective:    BP 130/80 (BP Location: Left Arm, Patient Position: Sitting, Cuff Size: Normal)   Pulse 78   Temp 98.4 F (36.9 C) (Oral)   Ht 5\' 6"  (1.676 m)   Wt 148 lb 9 oz (67.4 kg)   SpO2 97%   BMI 23.98 kg/m   Wt Readings from Last 3 Encounters:  07/05/18 148 lb 9 oz (67.4 kg)  02/16/18 148 lb (67.1 kg)  12/07/17 148 lb 4 oz (67.2 kg)    Physical Exam Vitals signs and nursing note reviewed.  Constitutional:      General: He is not in acute distress.    Appearance: He is well-developed.  HENT:     Head: Normocephalic and atraumatic.     Right Ear: Hearing normal.     Left Ear: Hearing normal.     Nose: No mucosal edema.     Right Sinus: No  maxillary sinus tenderness or frontal sinus tenderness.     Left Sinus: No maxillary sinus tenderness or frontal sinus tenderness.     Mouth/Throat:     Mouth: Mucous membranes are moist.     Pharynx: Uvula midline. No oropharyngeal exudate or posterior oropharyngeal erythema.     Tonsils: No tonsillar abscesses.  Eyes:     General: No scleral icterus.    Conjunctiva/sclera: Conjunctivae normal.     Pupils: Pupils are equal, round, and reactive to light.  Neck:     Musculoskeletal: Normal range of motion and neck supple.  Cardiovascular:     Rate and Rhythm: Normal rate and regular rhythm.     Pulses: Normal pulses.     Heart sounds: Normal heart sounds. No murmur.  Pulmonary:     Effort: Pulmonary effort is normal. No respiratory distress.     Breath sounds:  Normal breath sounds. No wheezing, rhonchi or rales.  Chest:     Chest wall: Tenderness (L lateral ribcage tenderness to palpation) present.  Musculoskeletal: Normal range of motion.     Comments: R shoulder WNL L shoulder exam: No deformity of shoulders on inspection. No pain with palpation of shoulder landmarks. FROM in abduction and forward flexion. No pain or weakness with testing SITS in ext/int rotation. No pain with empty can sign. Neg Speed test. No impingement. No pain with crossover test. No pain with rotation of humeral head in [place] joint.   Lymphadenopathy:     Cervical: No cervical adenopathy.  Skin:    General: Skin is warm and dry.     Findings: No rash.  Neurological:     Comments: 5/5 strength BUE Sensation intact  Psychiatric:        Mood and Affect: Mood normal.       Results for orders placed or performed in visit on 27/74/12  Basic metabolic panel  Result Value Ref Range   Sodium 140 135 - 145 mEq/L   Potassium 3.8 3.5 - 5.1 mEq/L   Chloride 99 96 - 112 mEq/L   CO2 34 (H) 19 - 32 mEq/L   Glucose, Bld 88 70 - 99 mg/dL   BUN 12 6 - 23 mg/dL   Creatinine, Ser 1.13 0.40 - 1.50 mg/dL   Calcium 9.4 8.4 - 10.5 mg/dL   GFR 80.78 >60.00 mL/min   EKG - NSR rate 75 with rate variation, normal axis, prolonged PR, no acute ST/T changes Assessment & Plan:   Problem List Items Addressed This Visit    Left-sided chest pain - Primary    Does not sound cardiac or pulmonary, no signs of stroke today.  In h/o abnormal CXR last month, will update CXR with nipple markers.  Check EKG - overall ok.  Anticipate MSK - positional, reproducible. Reassuring shoulder exam today. Reviewed treatment with patient as per instructions. Update if not improving with treatment. Avoid NSAID with genvoya interaction. Discussed sedation precautions with muscle relaxant. Discussed topical OTC treatment as well.       Relevant Orders   DG Chest 2 View (Completed)   EKG 12-Lead  (Completed)   Essential hypertension    Well controlled at this time.        Other Visit Diagnoses    Need for influenza vaccination       Relevant Orders   Flu Vaccine QUAD 36+ mos IM (Completed)       Meds ordered this encounter  Medications  . methocarbamol (ROBAXIN) 500 MG tablet  Sig: Take 1 tablet (500 mg total) by mouth 2 (two) times daily as needed for muscle spasms.    Dispense:  30 tablet    Refill:  0   Orders Placed This Encounter  Procedures  . DG Chest 2 View    Standing Status:   Future    Number of Occurrences:   1    Standing Expiration Date:   09/04/2019    Order Specific Question:   Reason for Exam (SYMPTOM  OR DIAGNOSIS REQUIRED)    Answer:   L sided chest pain    Order Specific Question:   Preferred imaging location?    Answer:   [facility]    Order Specific Question:   Radiology Contrast Protocol - do NOT remove file path    Answer:   [email or web address]  . Flu Vaccine QUAD 36+ mos IM  . EKG 12-Lead    Patient Instructions  Flu shot today Chest xray and EKG today  I think pain is coming from muscle strain. Treat with muscle relaxant sent to pharmacy. Use heating pad or heating blanket to left chest wall. May use salon pas patch over the counter.  Let me know if not better with this.    Follow up plan: No follow-ups on file.  [name], MD

## 2018-07-06 ENCOUNTER — Encounter: Payer: Self-pay | Admitting: Family Medicine

## 2018-07-06 ENCOUNTER — Telehealth: Payer: Self-pay | Admitting: Family Medicine

## 2018-07-06 DIAGNOSIS — I440 Atrioventricular block, first degree: Secondary | ICD-10-CM | POA: Insufficient documentation

## 2018-07-06 NOTE — Telephone Encounter (Signed)
Pt aware of results. [See imaging, 07/05/18.]

## 2018-07-06 NOTE — Assessment & Plan Note (Addendum)
Does not sound cardiac or pulmonary, no signs of stroke today.  In h/o abnormal CXR last month, will update CXR with nipple markers.  Check EKG - overall ok.  Anticipate MSK - positional, reproducible. Reassuring shoulder exam today. Reviewed treatment with patient as per instructions. Update if not improving with treatment. Avoid NSAID with genvoya interaction. Discussed sedation precautions with muscle relaxant. Discussed topical OTC treatment as well.

## 2018-07-06 NOTE — Telephone Encounter (Signed)
Patient returned Lisa's call about his x-ray results.Please call patient.

## 2018-07-06 NOTE — Assessment & Plan Note (Signed)
Well-controlled  at this time 

## 2018-07-20 MED FILL — GENVOYA TABLET: 150-150-200 | 30 days supply | Qty: 30 | Fill #1

## 2018-08-03 ENCOUNTER — Other Ambulatory Visit: Payer: Self-pay | Admitting: Family Medicine

## 2018-08-03 NOTE — Telephone Encounter (Signed)
Electronic refill request Robaxin Last refill 07/05/18 #30 Last office visit same date

## 2018-08-12 MED FILL — GENVOYA TABLET: 150-150-200 | 30 days supply | Qty: 30 | Fill #2

## 2018-08-23 ENCOUNTER — Ambulatory Visit (INDEPENDENT_AMBULATORY_CARE_PROVIDER_SITE_OTHER): Payer: Medicare Other

## 2018-08-23 ENCOUNTER — Other Ambulatory Visit: Payer: Self-pay

## 2018-08-23 ENCOUNTER — Encounter: Payer: Self-pay | Admitting: Podiatry

## 2018-08-23 ENCOUNTER — Ambulatory Visit (INDEPENDENT_AMBULATORY_CARE_PROVIDER_SITE_OTHER): Payer: Medicare Other | Admitting: Podiatry

## 2018-08-23 VITALS — Temp 98.5°F

## 2018-08-23 DIAGNOSIS — M779 Enthesopathy, unspecified: Secondary | ICD-10-CM

## 2018-08-23 DIAGNOSIS — M76821 Posterior tibial tendinitis, right leg: Secondary | ICD-10-CM

## 2018-08-23 MED ORDER — MELOXICAM 15 MG PO TABS: 15.0000 mg | ORAL_TABLET | Freq: Every day | ORAL | 1 refills | Status: DC

## 2018-08-23 NOTE — Progress Notes (Signed)
   HPI: 78 year old male presents the office today for evaluation of right foot and ankle pain.  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  Walking aggravates his pain.  He is tried icing it last night and taking aspirin without any significant improvement.  He presents for further treatment and evaluation  Past Medical History:  Diagnosis Date  . Bilateral hydrocele 2012  . Depression   . Diverticulosis 2013   by colonoscopy  . Emphysema lung ([place]) 03/2014    by CXR, remote smoking history  . History of colon cancer    2007 --  S/P RECTOSIGMOID COLECTOMY--  NO CHEMORADIATION--  NO RECURRENCE  . History of CVA (cerebrovascular accident)    2003-  RIGHT MIDDLE CVA---   NO RESIDUAL  . History of hepatitis B    REMOTE AND INACTIVE  PER DOCUMENTATION  . History of syphilis    SECONDARY SYPHILITIS TX'D IN 1998  PER DOCUMENTATION  . History of tuberculosis    LATENT TB  TX'D X12  MONTHS IN 1995  . HIV infection ([place])    [place] (DR CAMPBELL)  . Hypertension   . Prostate carcinoma [facility]) dx 09/2012   T1c, brachytherapy/seed implant Karsten Ro, [place])     Physical Exam: General: The patient is alert and oriented x3 in no acute distress.  Dermatology: Skin is warm, dry and supple bilateral lower extremities. Negative for open lesions or macerations.  Vascular: Palpable pedal pulses bilaterally. No edema or erythema noted. Capillary refill within normal limits.  Neurological: Epicritic and protective threshold grossly intact bilaterally.   Musculoskeletal Exam: Range of motion within normal limits to all pedal and ankle joints bilateral. Muscle strength 5/5 in all groups bilateral.  Pain on palpation to the insertion of the posterior tibial tendon just plantigrade to the navicular tuberosity  Radiographic Exam:  Normal osseous mineralization. Joint  spaces preserved. No fracture/dislocation/boney destruction.    Assessment: 1.  Insertional posterior tibial tendinitis right   Plan of Care:  1. Patient evaluated. X-Rays reviewed.  2.  Injection of 0.5 cc Celestone Soluspan injection for the posterior tibial tendon sheath right 3.  Prescription for meloxicam 15 mg daily 4.  Continue wearing good supportive sneakers 5.  Return to clinic in 4 weeks      Edrick Kins, DPM [facility]  Dr. Edrick Kins, DPM    [address], [place] [address]                Office [phone]  Fax [phone]

## 2018-09-08 MED FILL — GENVOYA TABLET: 150-150-200 | 30 days supply | Qty: 30 | Fill #3

## 2018-09-16 DIAGNOSIS — R972 Elevated prostate specific antigen [PSA]: Secondary | ICD-10-CM | POA: Diagnosis not present

## 2018-09-20 ENCOUNTER — Ambulatory Visit (INDEPENDENT_AMBULATORY_CARE_PROVIDER_SITE_OTHER): Payer: Medicare Other | Admitting: Podiatry

## 2018-09-20 ENCOUNTER — Encounter: Payer: Self-pay | Admitting: Podiatry

## 2018-09-20 ENCOUNTER — Other Ambulatory Visit: Payer: Self-pay

## 2018-09-20 VITALS — Temp 98.7°F

## 2018-09-20 DIAGNOSIS — M76821 Posterior tibial tendinitis, right leg: Secondary | ICD-10-CM | POA: Diagnosis not present

## 2018-09-20 DIAGNOSIS — L989 Disorder of the skin and subcutaneous tissue, unspecified: Secondary | ICD-10-CM | POA: Diagnosis not present

## 2018-09-22 NOTE — Progress Notes (Signed)
   Subjective: 78 year old male presenting today for follow up evaluation of posterior tibial tendinitis of the right lower extremity. He states his pain has improved a lot and the Meloxicam seems to be helping.  He has a new complaint of painful callus lesions to the plantar aspects of bilateral feet that have been present for the past few years. Walking increases the pain. He has not done anything for treatment. Patient is here for further evaluation and treatment.   Past Medical History:  Diagnosis Date  . Bilateral hydrocele 2012  . Depression   . Diverticulosis 2013   by colonoscopy  . Emphysema lung ([place]) 03/2014    by CXR, remote smoking history  . History of colon cancer    2007 --  S/P RECTOSIGMOID COLECTOMY--  NO CHEMORADIATION--  NO RECURRENCE  . History of CVA (cerebrovascular accident)    2003-  RIGHT MIDDLE CVA---   NO RESIDUAL  . History of hepatitis B    REMOTE AND INACTIVE  PER DOCUMENTATION  . History of syphilis    SECONDARY SYPHILITIS TX'D IN 1998  PER DOCUMENTATION  . History of tuberculosis    LATENT TB  TX'D X12  MONTHS IN 1995  . HIV infection ([place])    [place] (DR CAMPBELL)  . Hypertension   . Prostate carcinoma [facility]) dx 09/2012   T1c, brachytherapy/seed implant Karsten Ro, [place])     Objective:  Physical Exam General: Alert and oriented x3 in no acute distress  Dermatology: Hyperkeratotic lesions present on the bilateral feet. Pain on palpation with a central nucleated core noted. Skin is warm, dry and supple bilateral lower extremities. Negative for open lesions or macerations.  Vascular: Palpable pedal pulses bilaterally. No edema or erythema noted. Capillary refill within normal limits.  Neurological: Epicritic and protective threshold grossly intact bilaterally.   Musculoskeletal Exam: Pain on palpation at the keratotic lesions noted. Range of motion within normal limits bilateral. Muscle strength 5/5 in all  groups bilateral.  Assessment: 1. Insertional posterior tibial tendinitis right - resolved  2. Porokeratosis bilateral feet x 5   Plan of Care:  1. Patient evaluated 2. Excisional debridement of keratoic lesion using a chisel blade was performed without incident. Salinocaine applied.  3. Dressed area with light dressing. 4. Recommended OTC corn and callus remover.  5. Patient is to return to the clinic PRN.   Edrick Kins, DPM [facility]  Dr. Edrick Kins, [address]                                        [place], [place] [address]                Office [phone]  Fax [phone]

## 2018-09-29 DIAGNOSIS — R9721 Rising PSA following treatment for malignant neoplasm of prostate: Secondary | ICD-10-CM | POA: Diagnosis not present

## 2018-09-29 DIAGNOSIS — Z8546 Personal history of malignant neoplasm of prostate: Secondary | ICD-10-CM | POA: Diagnosis not present

## 2018-10-03 ENCOUNTER — Other Ambulatory Visit: Payer: Self-pay | Admitting: Podiatry

## 2018-10-03 DIAGNOSIS — M76821 Posterior tibial tendinitis, right leg: Secondary | ICD-10-CM

## 2018-10-03 MED FILL — GENVOYA TABLET: 150-150-200 | 30 days supply | Qty: 30 | Fill #4

## 2018-10-10 ENCOUNTER — Other Ambulatory Visit: Payer: Self-pay | Admitting: Podiatry

## 2018-10-10 ENCOUNTER — Other Ambulatory Visit: Payer: Self-pay | Admitting: Family Medicine

## 2018-10-12 NOTE — Telephone Encounter (Signed)
This encounter was created in error - please disregard.

## 2018-10-25 DIAGNOSIS — N402 Nodular prostate without lower urinary tract symptoms: Secondary | ICD-10-CM | POA: Diagnosis not present

## 2018-10-25 DIAGNOSIS — C61 Malignant neoplasm of prostate: Secondary | ICD-10-CM | POA: Diagnosis not present

## 2018-10-31 MED FILL — GENVOYA TABLET: 150-150-200 | 30 days supply | Qty: 30 | Fill #5

## 2018-11-03 DIAGNOSIS — Z8546 Personal history of malignant neoplasm of prostate: Secondary | ICD-10-CM | POA: Diagnosis not present

## 2018-11-06 ENCOUNTER — Other Ambulatory Visit: Payer: Self-pay | Admitting: Family Medicine

## 2018-11-06 DIAGNOSIS — I10 Essential (primary) hypertension: Secondary | ICD-10-CM

## 2018-11-17 ENCOUNTER — Other Ambulatory Visit: Payer: Self-pay

## 2018-11-17 ENCOUNTER — Other Ambulatory Visit: Payer: Medicare Other

## 2018-11-17 DIAGNOSIS — B20 Human immunodeficiency virus [HIV] disease: Secondary | ICD-10-CM

## 2018-11-18 LAB — T-HELPER CELL (CD4) - (RCID CLINIC ONLY)
CD4 % Helper T Cell: 41 % (ref 33–65)
CD4 T Cell Abs: 976 /uL (ref 400–1790)

## 2018-11-22 ENCOUNTER — Other Ambulatory Visit: Payer: Self-pay | Admitting: Family Medicine

## 2018-11-23 LAB — RPR TITER: RPR Titer: 1:2 {titer} — ABNORMAL HIGH

## 2018-11-23 LAB — FLUORESCENT TREPONEMAL AB(FTA)-IGG-BLD: Fluorescent Treponemal ABS: REACTIVE — AB

## 2018-11-23 LAB — CBC
HCT: 41.3 % (ref 38.5–50.0)
Hemoglobin: 13.2 g/dL (ref 13.2–17.1)
MCH: 28.3 pg (ref 27.0–33.0)
MCHC: 32 g/dL (ref 32.0–36.0)
MCV: 88.4 fL (ref 80.0–100.0)
MPV: 12.2 fL (ref 7.5–12.5)
Platelets: 213 10*3/uL (ref 140–400)
RBC: 4.67 10*6/uL (ref 4.20–5.80)
RDW: 14 % (ref 11.0–15.0)
WBC: 7.2 10*3/uL (ref 3.8–10.8)

## 2018-11-23 LAB — COMPREHENSIVE METABOLIC PANEL
AG Ratio: 1.2 (calc) (ref 1.0–2.5)
ALT: 17 U/L (ref 9–46)
AST: 19 U/L (ref 10–35)
Albumin: 3.7 g/dL (ref 3.6–5.1)
Alkaline phosphatase (APISO): 74 U/L (ref 35–144)
BUN/Creatinine Ratio: 12 (calc) (ref 6–22)
BUN: 15 mg/dL (ref 7–25)
CO2: 29 mmol/L (ref 20–32)
Calcium: 9.1 mg/dL (ref 8.6–10.3)
Chloride: 106 mmol/L (ref 98–110)
Creat: 1.29 mg/dL — ABNORMAL HIGH (ref 0.70–1.18)
Globulin: 3 g/dL (calc) (ref 1.9–3.7)
Glucose, Bld: 88 mg/dL (ref 65–99)
Potassium: 3.6 mmol/L (ref 3.5–5.3)
Sodium: 143 mmol/L (ref 135–146)
Total Bilirubin: 0.6 mg/dL (ref 0.2–1.2)
Total Protein: 6.7 g/dL (ref 6.1–8.1)

## 2018-11-23 LAB — HIV-1 RNA QUANT-NO REFLEX-BLD
HIV 1 RNA Quant: <20 copies/mL
HIV-1 RNA Quant, Log: <1.3 Log copies/mL

## 2018-11-23 LAB — RPR: RPR Ser Ql: REACTIVE — AB

## 2018-11-23 NOTE — Telephone Encounter (Signed)
Last OV 07/05/18 Last refill 10/12/18 #30 x 0 rf No up coming appts

## 2018-11-24 ENCOUNTER — Other Ambulatory Visit: Payer: Self-pay | Admitting: Infectious Diseases

## 2018-11-24 DIAGNOSIS — B20 Human immunodeficiency virus [HIV] disease: Secondary | ICD-10-CM

## 2018-11-28 MED FILL — GENVOYA TABLET: 150-150-200 | 30 days supply | Qty: 30 | Fill #0

## 2018-11-30 ENCOUNTER — Telehealth: Payer: Self-pay | Admitting: Internal Medicine

## 2018-11-30 NOTE — Telephone Encounter (Signed)
COVID-19 Pre-Screening Questions: ° °Do you currently have a fever (>100 °F), chills or unexplained body aches? N ° °Are you currently experiencing new cough, shortness of breath, sore throat, runny nose? N °•  °Have you recently travelled outside the state of [place] in the last 14 days? N  °•  °Have you been in contact with someone that is currently pending confirmation of Covid19 testing or has been confirmed to have the Covid19 virus?  N ° °**If the patient answers NO to ALL questions -  advise the patient to please call the clinic before coming to the office should any symptoms develop.  ° ° ° °

## 2018-12-01 ENCOUNTER — Other Ambulatory Visit: Payer: Self-pay

## 2018-12-01 ENCOUNTER — Ambulatory Visit (INDEPENDENT_AMBULATORY_CARE_PROVIDER_SITE_OTHER): Payer: Medicare Other | Admitting: Internal Medicine

## 2018-12-01 ENCOUNTER — Encounter: Payer: Self-pay | Admitting: Internal Medicine

## 2018-12-01 DIAGNOSIS — B20 Human immunodeficiency virus [HIV] disease: Secondary | ICD-10-CM

## 2018-12-01 NOTE — Assessment & Plan Note (Signed)
His infection is under excellent, long-term control.  He will continue Genvoya and follow-up after lab work in 1 year. 

## 2018-12-01 NOTE — Progress Notes (Signed)
Patient Active Problem List   Diagnosis Date Noted  . History of rectal cancer 04/22/2010    Priority: High  . Human immunodeficiency virus (HIV) disease ([place]) 05/15/2006    Priority: High  . Essential hypertension 05/15/2006    Priority: High  . History of stroke without residual deficits 05/15/2006    Priority: High  . 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 Left-sided chest pain 07/05/2018  . TIA (transient ischemic attack) 11/18/2017  . Hypokalemia 03/20/2016  . 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 Advanced care planning/counseling discussion 06/26/2014  . 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 Prostate CA ([place]) 11/08/2012  . Medicare annual wellness visit, subsequent 08/02/2012  . 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 WEIGHT LOSS 12/18/2008  . CONSTIPATION 03/16/2008  . TUBERCULOSIS 05/15/2006  . SYPHILIS 05/15/2006  . 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  . Ex-smoker 05/15/2006  . HEPATITIS B, HX OF 05/15/2006  . GASTROINTESTINAL HEMORRHAGE, HX OF 05/15/2006    Patient's Medications  New Prescriptions   No medications on file  Previous Medications   ASPIRIN 325 MG EC TABLET    Take 1 tablet (325 mg total) by mouth daily.   ATORVASTATIN (LIPITOR) 40 MG TABLET    Take 1 tablet (40 mg total) by mouth daily.   GENVOYA 150-150-200-10 MG TABS TABLET    TAKE 1 TABLET BY MOUTH DAILY WITH BREAKFAST.   HYDROCHLOROTHIAZIDE (HYDRODIURIL) 25 MG TABLET    TAKE 1 TABLET BY MOUTH EVERY DAY   LISINOPRIL (PRINIVIL,ZESTRIL) 20 MG TABLET    Take 1 tablet (20 mg total) by mouth daily.   MELOXICAM (MOBIC) 15 MG TABLET    TAKE 1 TABLET BY MOUTH EVERY DAY   METHOCARBAMOL (ROBAXIN) 500 MG TABLET    TAKE 1 TABLET (500 MG TOTAL) BY MOUTH 2 (TWO) TIMES DAILY AS NEEDED FOR MUSCLE SPASMS.  Modified Medications   No medications on file  Discontinued Medications   No medications on file    Subjective: This is in for his routine HIV follow-up visit.  He has  had no problems obtaining, taking or tolerating his Genvoya and does not recall missing any doses.  He is not on any new medications.  He is feeling well.  He has been working through the [employer] pandemic but feels safe at work.  Review of Systems: Review of Systems  Constitutional: Negative for fever.  Respiratory: Negative for cough and shortness of breath.   Cardiovascular: Negative for chest pain.  Gastrointestinal: Negative for abdominal pain, diarrhea, nausea and vomiting.  Psychiatric/Behavioral: Negative for depression.    Past Medical History:  Diagnosis Date  . Bilateral hydrocele 2012  . Depression   . Diverticulosis 2013   by colonoscopy  . 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 History of colon cancer    2007 --  S/P RECTOSIGMOID COLECTOMY--  NO CHEMORADIATION--  NO RECURRENCE  . History of CVA (cerebrovascular accident)    2003-  RIGHT MIDDLE CVA---   NO RESIDUAL  . History of hepatitis B    REMOTE AND INACTIVE  PER DOCUMENTATION  . History of syphilis    SECONDARY SYPHILITIS TX'D IN 1998  PER DOCUMENTATION  . History of tuberculosis    LATENT TB  TX'D X12  MONTHS IN 1995  . HIV infection ([place])    [place] (DR Aslan Montagna)  . Hypertension   . Prostate  carcinoma [facility]) dx 09/2012   T1c, brachytherapy/seed implant Karsten Ro, [place] Klippel)    Social History   Tobacco Use  . Smoking status: Former Smoker    Packs/day: 0.30    Years: 35.00    Pack years: 10.50    Types: Cigarettes    Quit date: 03/04/2009    Years since quitting: 9.7  . Smokeless tobacco: Never Used  Substance Use Topics  . Alcohol use: No    Alcohol/week: 0.0 standard drinks  . Drug use: No    Family History  Problem Relation Age of Onset  . Cancer Sister        breast  . Breast cancer Sister   . Cancer Brother 25       prostate, treated with seed implant  . Cancer Brother        prostate  . Cancer Brother        prostate  .  Cancer Daughter        breast  . Cancer Other        prostate  . Cancer Father        unsure  . CAD Neg Hx   . Stroke Neg Hx   . Diabetes Neg Hx     No Known Allergies  Health Maintenance  Topic Date Due  . DTaP/Tdap/Td (1 - Tdap) 08/03/2022 (Originally 04/10/1960)  . INFLUENZA VACCINE  12/03/2018  . TETANUS/TDAP  08/03/2022  . PNA vac Low Risk Adult  Completed    Objective:  Vitals:   12/01/18 1336  BP: (!) 152/84  Pulse: 72  Temp: 98 F (36.7 C)   There is no height or weight on file to calculate BMI.  Physical Exam Constitutional:      Comments: He is in good spirits.  Cardiovascular:     Rate and Rhythm: Normal rate and regular rhythm.     Heart sounds: No murmur.  Pulmonary:     Effort: Pulmonary effort is normal.     Breath sounds: Normal breath sounds.  Abdominal:     Palpations: Abdomen is soft.     Tenderness: There is no abdominal tenderness.  Psychiatric:        Mood and Affect: Mood normal.     Lab Results Lab Results  Component Value Date   WBC 7.2 11/17/2018   HGB 13.2 11/17/2018   HCT 41.3 11/17/2018   MCV 88.4 11/17/2018   PLT 213 11/17/2018    Lab Results  Component Value Date   CREATININE 1.29 (H) 11/17/2018   BUN 15 11/17/2018   NA 143 11/17/2018   K 3.6 11/17/2018   CL 106 11/17/2018   CO2 29 11/17/2018    Lab Results  Component Value Date   ALT 17 11/17/2018   AST 19 11/17/2018   ALKPHOS 77 11/18/2017   BILITOT 0.6 11/17/2018    Lab Results  Component Value Date   CHOL 140 11/19/2017   HDL 35 (L) 11/19/2017   LDLCALC 88 11/19/2017   TRIG 85 11/19/2017   CHOLHDL 4.0 11/19/2017   Lab Results  Component Value Date   LABRPR REACTIVE (A) 11/17/2018   RPRTITER 1:2 (H) 11/17/2018   HIV 1 RNA Quant (copies/mL)  Date Value  11/17/2018 <20 NOT DETECTED  11/09/2017 <20 DETECTED (A)  11/26/2016 <20 NOT DETECTED   CD4 T Cell Abs (/uL)  Date Value  11/17/2018 976  11/09/2017 810  11/26/2016 770     Problem List  Items Addressed This Visit  High   Human immunodeficiency virus (HIV) disease ([place])    His infection is under excellent, long-term control.  He will continue Genvoya and follow-up after lab work in 1 year.      Relevant Orders   CBC   T-helper cell (CD4)- ([facility] clinic only)   Comprehensive metabolic panel   RPR   HIV-1 RNA quant-no reflex-bld        Michel Bickers, MD [facility] [place] [phone] pager   [phone] cell 12/01/2018, 1:43 PM

## 2018-12-03 ENCOUNTER — Other Ambulatory Visit: Payer: Self-pay | Admitting: Podiatry

## 2018-12-11 ENCOUNTER — Other Ambulatory Visit: Payer: Self-pay | Admitting: Family Medicine

## 2018-12-13 NOTE — Telephone Encounter (Signed)
Does patient need follow up appointment?

## 2018-12-14 ENCOUNTER — Other Ambulatory Visit: Payer: Self-pay | Admitting: Family Medicine

## 2018-12-14 NOTE — Telephone Encounter (Signed)
Yes plz schedule medicare wellness visit

## 2018-12-14 NOTE — Telephone Encounter (Signed)
Please schedule when possible.  

## 2018-12-14 NOTE — Telephone Encounter (Signed)
Opened in error

## 2018-12-16 NOTE — Telephone Encounter (Signed)
Spoke to pt's wife and she is going to give him the message to call us to schedule medicare wellness visit

## 2018-12-26 MED FILL — GENVOYA TABLET: 150-150-200 | 30 days supply | Qty: 30 | Fill #1

## 2019-01-06 ENCOUNTER — Other Ambulatory Visit: Payer: Self-pay | Admitting: Family Medicine

## 2019-01-16 ENCOUNTER — Other Ambulatory Visit: Payer: Self-pay | Admitting: Family Medicine

## 2019-01-17 NOTE — Telephone Encounter (Signed)
E-scribed refill.  Pls schedule wellness and labs.

## 2019-01-17 NOTE — Telephone Encounter (Signed)
Pt will have AWV on 03/06/19 then see Dr. Darnell Level on 03/13/19 with labs same day per patient

## 2019-01-19 MED FILL — GENVOYA TABLET: 150-150-200 | 30 days supply | Qty: 30 | Fill #2

## 2019-02-05 ENCOUNTER — Other Ambulatory Visit: Payer: Self-pay | Admitting: Family Medicine

## 2019-02-05 ENCOUNTER — Other Ambulatory Visit: Payer: Self-pay | Admitting: Podiatry

## 2019-02-06 NOTE — Telephone Encounter (Signed)
Methocarbamol Last filled:  11/23/18< #30 Last OV:  07/05/18, acute and HTN f/u Next OV:  11/9//20, AWV

## 2019-02-08 IMAGING — MR MR HEAD W/O CM
10 series · 48 of 48 positions shown · non-contrast
Comparison: 11/18/2017 CT head

CLINICAL DATA: 76 y/o  M; speech difficulty.

EXAM:
MRI HEAD WITHOUT CONTRAST
TECHNIQUE: Multiplanar, multiecho pulse sequences of the brain and surrounding
structures were obtained without intravenous contrast.

[Series 2: T1 · sagittal · 5.0mm · 0.45mm/px · 4 of 27 slices shown (1 of 2)]
[im 1/27]
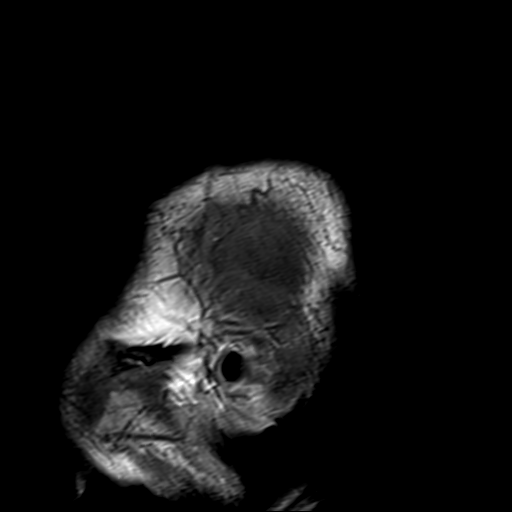
[im 9/27]
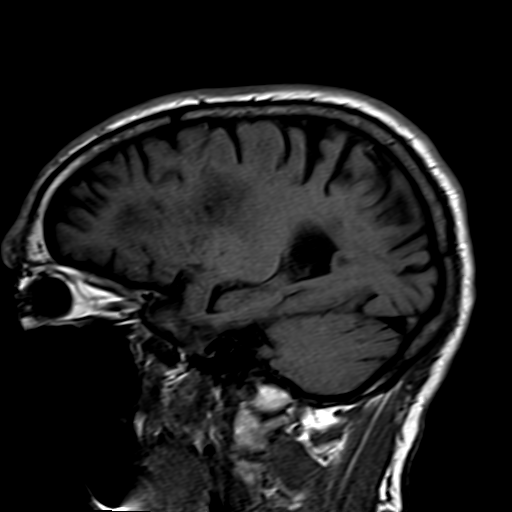
[im 18/27]
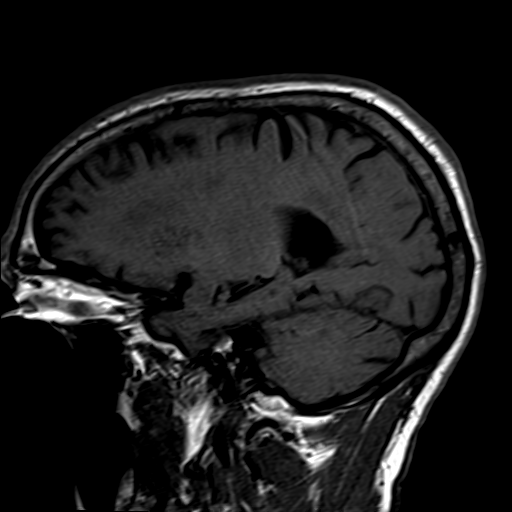
[im 27/27]
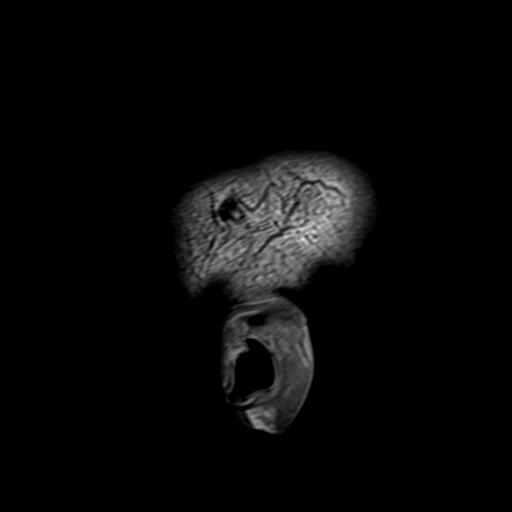

[Series 4: DWI · axial · 3.0mm · 1.80mm/px · z∈[-109,+42]mm · 6 of 50 slices shown (1 of 2)]
[im 1/50]
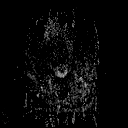
[im 10/50]
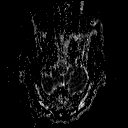
[im 20/50]
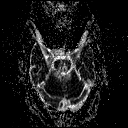
[im 30/50]
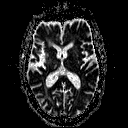
[im 40/50]
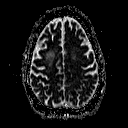
[im 50/50]
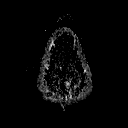

[Series 6: DWI · coronal · 3.0mm · 1.80mm/px · 6 of 49 slices shown (2 of 2)]
[im 1/49]
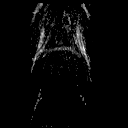
[im 10/49]
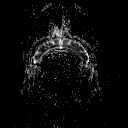
[im 20/49]
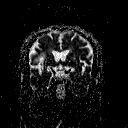
[im 29/49]
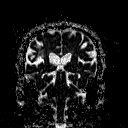
[im 39/49]
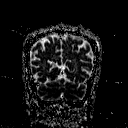
[im 49/49]
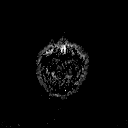

[Series 7: T2 · axial · 5.0mm · 0.90mm/px · z∈[-109,+46]mm · 3 of 25 slices shown (1 of 3)]
[im 1/25]
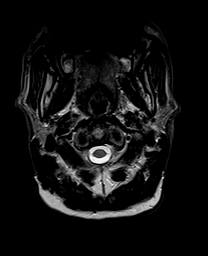
[im 13/25]
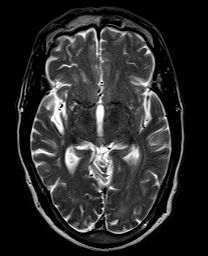
[im 25/25]
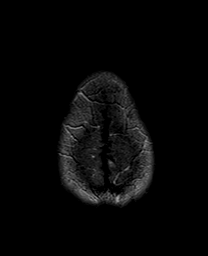

[Series 8: FLAIR · axial · 5.0mm · 0.45mm/px · z∈[-109,+46]mm · 3 of 25 slices shown]
[im 1/25]
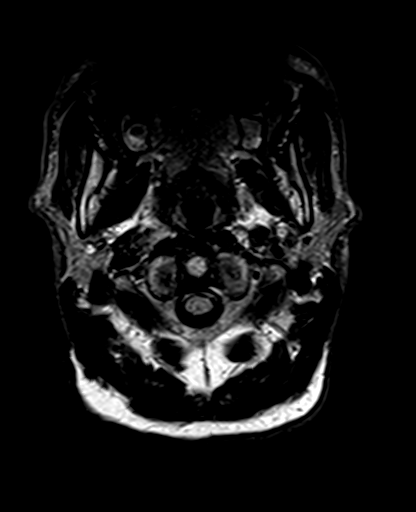
[im 13/25]
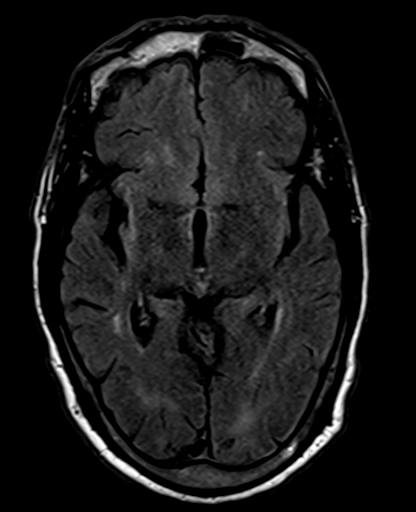
[im 25/25]
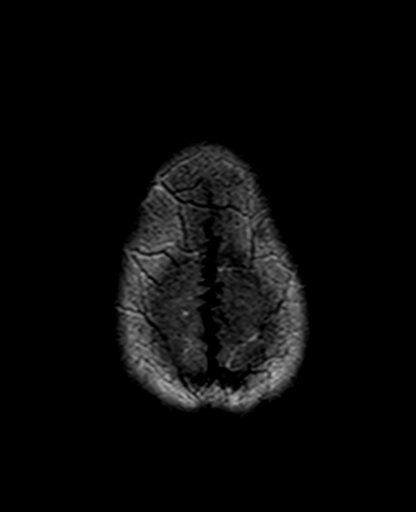

[Series 9: T2 · axial · 5.0mm · 0.45mm/px · z∈[-109,+46]mm · 3 of 25 slices shown (2 of 3)]
[im 1/25]
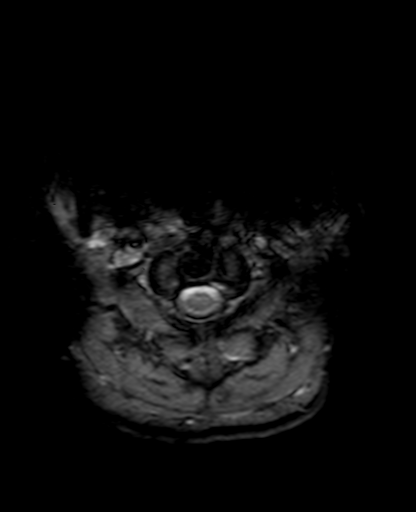
[im 13/25]
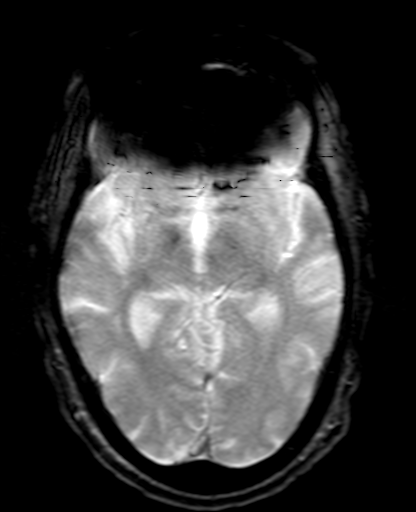
[im 25/25]
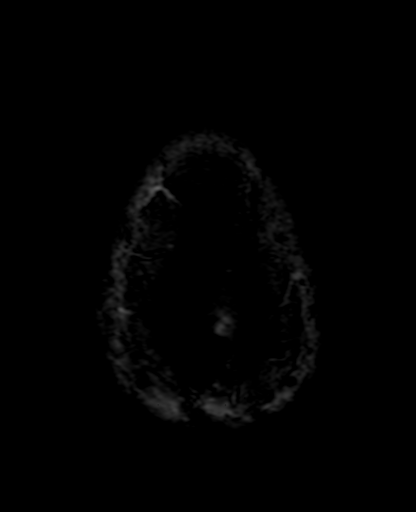

[Series 10: T1 · axial · 3.0mm · 1.00mm/px · z∈[-107,+45]mm · 7 of 52 slices shown (2 of 2)]
[im 1/52]
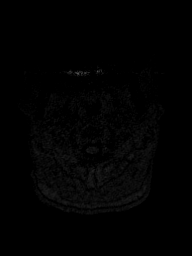
[im 9/52]
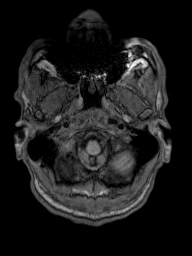
[im 18/52]
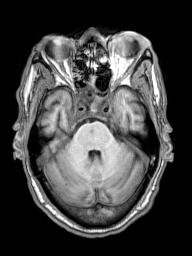
[im 26/52]
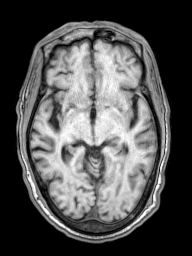
[im 35/52]
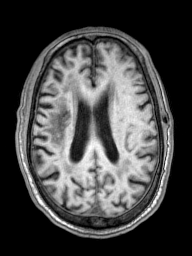
[im 43/52]
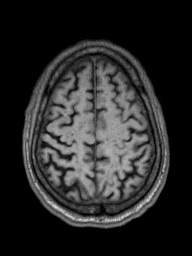
[im 52/52]
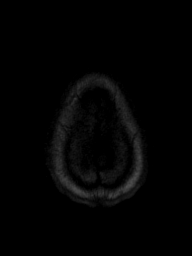

[Series 11: T2 · coronal · 5.0mm · 0.86mm/px · 4 of 31 slices shown (3 of 3)]
[im 1/31]
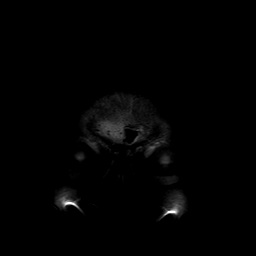
[im 11/31]
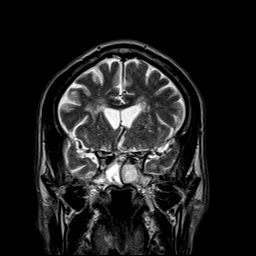
[im 21/31]
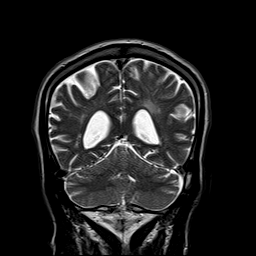
[im 31/31]
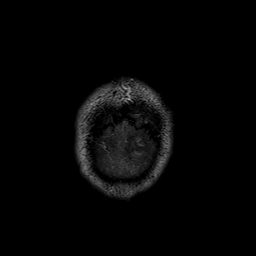

[Series 100: ax (id) · axial · 3.0mm · 1.80mm/px · z∈[-109,+42]mm · 6 of 51 slices shown]
[im 1/51]
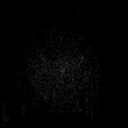
[im 11/51]
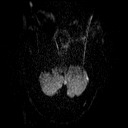
[im 21/51]
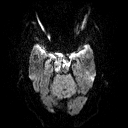
[im 31/51]
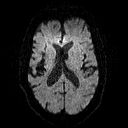
[im 41/51]
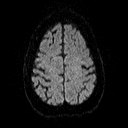
[im 51/51]
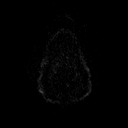

[Series 102: cor (id). · coronal · 3.0mm · 1.80mm/px · 6 of 48 slices shown]
[im 1/48]
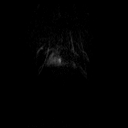
[im 10/48]
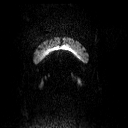
[im 19/48]
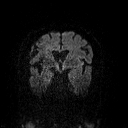
[im 29/48]
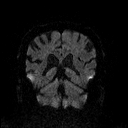
[im 38/48]
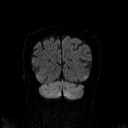
[im 48/48]
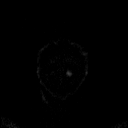

[48 of 48 positions shown; findings below may reference images not displayed]

FINDINGS: Brain: No acute infarction, hemorrhage, hydrocephalus, extra-axial
collection or mass lesion. Chronic right perisylvian infarct
involving the frontal operculum and insula. Very small chronic
infarction in the left anterolateral frontal cortex. Small chronic
lacunar infarction within right thalamus. Several nonspecific foci
of T2 FLAIR hyperintense signal abnormality in subcortical and
periventricular white matter are compatible with moderate chronic
microvascular ischemic changes for age. Moderate brain parenchymal
volume loss. Small focus of susceptibility hypointensity and low T2
signal in the right cerebellar hemisphere compatible with
hemosiderin deposition of chronic microhemorrhage.

Vascular: Normal flow voids.

Skull and upper cervical spine: Normal marrow signal.

Sinuses/Orbits: Extensive paranasal sinus mucosal thickening and
patchy opacification. No abnormal signal of mastoid air cells.
Orbits partially obscured by susceptibility artifact from dental
hardware. Visualized portions of the orbits are unremarkable.

Other: None.
IMPRESSION: 1. No acute intracranial abnormality identified.
2. Moderate chronic microvascular ischemic changes and volume loss
of the brain.
3. Several small chronic infarctions in the brain as above.
4. Extensive paranasal sinus disease.

By: Jan-Uwe Venus M.D.

## 2019-02-15 DIAGNOSIS — C61 Malignant neoplasm of prostate: Secondary | ICD-10-CM | POA: Diagnosis not present

## 2019-02-16 ENCOUNTER — Telehealth: Payer: Self-pay | Admitting: Pharmacy Technician

## 2019-02-16 MED FILL — GENVOYA TABLET: 150-150-200 | 30 days supply | Qty: 30 | Fill #3

## 2019-02-16 NOTE — Telephone Encounter (Signed)
[facility] Patient Advocate Encounter   I was successful in securing patient a $7500 grant from [name] (PAF) to provide copayment coverage for [employer]. This will make the out of pocket cost $0.     The billing information is as follows and has been shared with [place].   RxBin: [id number] PCN: [id number] Member ID: [id number] Group ID: [id number] Dates of Eligibility: 02/16/2019 through 02/16/2020 [phone] (p)  Patient knows to call the office with questions or concerns.  Bartholomew Crews, CPhT [facility] Phone: [phone] Fax: [phone] 02/16/2019 8:42 AM

## 2019-03-05 ENCOUNTER — Other Ambulatory Visit: Payer: Self-pay | Admitting: Family Medicine

## 2019-03-06 ENCOUNTER — Ambulatory Visit: Payer: Medicare Other

## 2019-03-06 ENCOUNTER — Telehealth: Payer: Self-pay

## 2019-03-06 NOTE — Telephone Encounter (Signed)
Called patient 3 times trying to complete his annual medicare wellness visit. Patient never answered call. Unable to leave message because no voicemail was available. Appointment was cancelled.

## 2019-03-13 ENCOUNTER — Other Ambulatory Visit: Payer: Self-pay

## 2019-03-13 ENCOUNTER — Ambulatory Visit (INDEPENDENT_AMBULATORY_CARE_PROVIDER_SITE_OTHER): Payer: Medicare Other | Admitting: Family Medicine

## 2019-03-13 ENCOUNTER — Encounter: Payer: Self-pay | Admitting: Family Medicine

## 2019-03-13 VITALS — BP 152/82 | HR 77 | Temp 97.8°F | Ht 65.5 in | Wt 140.0 lb

## 2019-03-13 DIAGNOSIS — C61 Malignant neoplasm of prostate: Secondary | ICD-10-CM | POA: Diagnosis not present

## 2019-03-13 DIAGNOSIS — Z8673 Personal history of transient ischemic attack (TIA), and cerebral infarction without residual deficits: Secondary | ICD-10-CM | POA: Diagnosis not present

## 2019-03-13 DIAGNOSIS — Z85048 Personal history of other malignant neoplasm of rectum, rectosigmoid junction, and anus: Secondary | ICD-10-CM | POA: Diagnosis not present

## 2019-03-13 DIAGNOSIS — Z23 Encounter for immunization: Secondary | ICD-10-CM

## 2019-03-13 DIAGNOSIS — Z7189 Other specified counseling: Secondary | ICD-10-CM | POA: Diagnosis not present

## 2019-03-13 DIAGNOSIS — N289 Disorder of kidney and ureter, unspecified: Secondary | ICD-10-CM | POA: Diagnosis not present

## 2019-03-13 DIAGNOSIS — Z0000 Encounter for general adult medical examination without abnormal findings: Secondary | ICD-10-CM | POA: Diagnosis not present

## 2019-03-13 DIAGNOSIS — J439 Emphysema, unspecified: Secondary | ICD-10-CM | POA: Diagnosis not present

## 2019-03-13 DIAGNOSIS — B20 Human immunodeficiency virus [HIV] disease: Secondary | ICD-10-CM

## 2019-03-13 DIAGNOSIS — I10 Essential (primary) hypertension: Secondary | ICD-10-CM | POA: Diagnosis not present

## 2019-03-13 NOTE — Patient Instructions (Addendum)
Flu shot today Labs today If interested, check with pharmacy about new 2 shot shingles series (shingrix).  Work on advanced directive - packet provided today Increase water intake.  You are doing well today Return as needed or in 1 year for next wellness visit.  Health Maintenance After Age 79 After age 83, you are at a higher risk for certain long-term diseases and infections as well as injuries from falls. Falls are a major cause of broken bones and head injuries in people who are older than age 73. Getting regular preventive care can help to keep you healthy and well. Preventive care includes getting regular testing and making lifestyle changes as recommended by your health care provider. Talk with your health care provider about:  Which screenings and tests you should have. A screening is a test that checks for a disease when you have no symptoms.  A diet and exercise plan that is right for you. What should I know about screenings and tests to prevent falls? Screening and testing are the best ways to find a health problem early. Early diagnosis and treatment give you the best chance of managing medical conditions that are common after age 65. Certain conditions and lifestyle choices may make you more likely to have a fall. Your health care provider may recommend:  Regular vision checks. Poor vision and conditions such as cataracts can make you more likely to have a fall. If you wear glasses, make sure to get your prescription updated if your vision changes.  Medicine review. Work with your health care provider to regularly review all of the medicines you are taking, including over-the-counter medicines. Ask your health care provider about any side effects that may make you more likely to have a fall. Tell your health care provider if any medicines that you take make you feel dizzy or sleepy.  Osteoporosis screening. Osteoporosis is a condition that causes the bones to get weaker. This can  make the bones weak and cause them to break more easily.  Blood pressure screening. Blood pressure changes and medicines to control blood pressure can make you feel dizzy.  Strength and balance checks. Your health care provider may recommend certain tests to check your strength and balance while standing, walking, or changing positions.  Foot health exam. Foot pain and numbness, as well as not wearing proper footwear, can make you more likely to have a fall.  Depression screening. You may be more likely to have a fall if you have a fear of falling, feel emotionally low, or feel unable to do activities that you used to do.  Alcohol use screening. Using too much alcohol can affect your balance and may make you more likely to have a fall. What actions can I take to lower my risk of falls? General instructions  Talk with your health care provider about your risks for falling. Tell your health care provider if: ? You fall. Be sure to tell your health care provider about all falls, even ones that seem minor. ? You feel dizzy, sleepy, or off-balance.  Take over-the-counter and prescription medicines only as told by your health care provider. These include any supplements.  Eat a healthy diet and maintain a healthy weight. A healthy diet includes low-fat dairy products, low-fat (lean) meats, and fiber from whole grains, beans, and lots of fruits and vegetables. Home safety  Remove any tripping hazards, such as rugs, cords, and clutter.  Install safety equipment such as grab bars in bathrooms and safety  rails on stairs.  Keep rooms and walkways well-lit. Activity   Follow a regular exercise program to stay fit. This will help you maintain your balance. Ask your health care provider what types of exercise are appropriate for you.  If you need a cane or walker, use it as recommended by your health care provider.  Wear supportive shoes that have nonskid soles. Lifestyle  Do not drink  alcohol if your health care provider tells you not to drink.  If you drink alcohol, limit how much you have: ? 0-1 drink a day for women. ? 0-2 drinks a day for men.  Be aware of how much alcohol is in your drink. In the [place] one drink equals one typical bottle of beer (12 oz), one-half glass of wine (5 oz), or one shot of hard liquor (1 oz).  Do not use any products that contain nicotine or tobacco, such as cigarettes and e-cigarettes. If you need help quitting, ask your health care provider. Summary  Having a healthy lifestyle and getting preventive care can help to protect your health and wellness after age 13.  Screening and testing are the best way to find a health problem early and help you avoid having a fall. Early diagnosis and treatment give you the best chance for managing medical conditions that are more common for people who are older than age 22.  Falls are a major cause of broken bones and head injuries in people who are older than age 47. Take precautions to prevent a fall at home.  Work with your health care provider to learn what changes you can make to improve your health and wellness and to prevent falls. This information is not intended to replace advice given to you by your health care provider. Make sure you discuss any questions you have with your health care provider. Document Released: 03/03/2017 Document Revised: 08/11/2018 Document Reviewed: 03/03/2017 Elsevier Patient Education  2020 [employer].

## 2019-03-13 NOTE — Assessment & Plan Note (Signed)
Chronic, stable on lisinopril - continue.  

## 2019-03-13 NOTE — Assessment & Plan Note (Addendum)
Advanced directive discussion - doesn't have set up, agrees to packet. would want wife then grand daughter to be HCPOA - rocky relationship with wife. Doesn't want prolonged life support if terminal condition.Ok for trial if he could improve.

## 2019-03-13 NOTE — Progress Notes (Signed)
This visit was conducted in person.  BP (!) 152/82 (BP Location: Right Arm, Patient Position: Sitting, Cuff Size: Normal)   Pulse 77   Temp 97.8 F (36.6 C) (Temporal)   Ht 5' 5.5" (1.664 m)   Wt 140 lb (63.5 kg)   SpO2 96%   BMI 22.94 kg/m    CC: AMW  Subjective:    Patient ID: Charles Daniel, male    DOB: 06/17/1940, 78 y.o.   MRN: [id number]  HPI: Charles Daniel is a 78 y.o. male presenting on 03/13/2019 for Medicare Wellness   Was unable to be reached by health advisor this year for medicare wellness visit.    Hearing Screening   125Hz  250Hz  500Hz  1000Hz  2000Hz  3000Hz  4000Hz  6000Hz  8000Hz   Right ear:   20 40 40  0    Left ear:   20 40 0  20      Visual Acuity Screening   Right eye Left eye Both eyes  Without correction: 20/30 20/25 20/25   With correction:         Office Visit from 03/13/2019 in [place] at [facility]  PHQ-2 Total Score  0      Fall Risk  03/13/2019 12/01/2018 09/28/2017 12/10/2016 07/03/2016  Falls in the past year? 0 0 No No No  Number falls in past yr: - 0 - - -  Injury with Fall? - 0 - - -      HIV followed by Dr Megan Salon. Family not aware of dx.  H/o COPD - feels breathing is doing well.   Preventative: COLONOSCOPY 10/2016 7 TAs, diverticulosis, no rpt recommended ([name])  H/o prostate cancer - sees Ottelin yearly, s/p radioactive seed implant 01/2013.  Lung cancer screening - not candidate - smoking 1/3 ppd for 30 yrs Flu shot yearly Pneumovax - 09/2010 and 2007.Prevnar9/2017 Td- 08/2012 Shingles shot - declines for financial reasons  Advanced directive discussion - doesn't have set up, agrees to packet. would want wife then grand daughter to be HCPOA - rocky relationship with wife. Doesn't want prolonged life support if terminal condition.Ok for trial if he could improve. Seat belt use discussed Sunscreen use discussed.No changing moles on skin.  Ex smoker - quit 2010  Alcohol - none  Dentist - does not see - has dentures.   Eye exam - hasn't seen in years  Caffeine: 3 sodas/day Lives with wife. 2 grown sons. Occupation: retired from city of [employer], now works as custodian Radio broadcast assistant in [place]  Edu: 10th grade  Activity: walks at work  Diet: some water, fruits and vegetables regular     Relevant past medical, surgical, family and social history reviewed and updated as indicated. Interim medical history since our last visit reviewed. Allergies and medications reviewed and updated. Outpatient Medications Prior to Visit  Medication Sig Dispense Refill  . aspirin 325 MG EC tablet TAKE 1 TABLET BY MOUTH EVERY DAY 125 tablet 0  . atorvastatin (LIPITOR) 40 MG tablet TAKE 1 TABLET BY MOUTH EVERY DAY 90 tablet 2  . GENVOYA 150-150-200-10 MG TABS tablet TAKE 1 TABLET BY MOUTH DAILY WITH BREAKFAST. 30 tablet 5  . hydrochlorothiazide (HYDRODIURIL) 25 MG tablet TAKE 1 TABLET BY MOUTH EVERY DAY 90 tablet 1  . lisinopril (ZESTRIL) 20 MG tablet TAKE 1 TABLET (20 MG TOTAL) BY MOUTH DAILY. NEEDS AN APPOINTMENT 30 tablet 0  . meloxicam (MOBIC) 15 MG tablet TAKE 1 TABLET BY MOUTH EVERY DAY 30 tablet 2  . methocarbamol (ROBAXIN) 500 MG tablet  TAKE 1 TABLET (500 MG TOTAL) BY MOUTH 2 (TWO) TIMES DAILY AS NEEDED FOR MUSCLE SPASMS. 30 tablet 0   No facility-administered medications prior to visit.      Per HPI unless specifically indicated in ROS section below Review of Systems Objective:    BP (!) 152/82 (BP Location: Right Arm, Patient Position: Sitting, Cuff Size: Normal)   Pulse 77   Temp 97.8 F (36.6 C) (Temporal)   Ht 5' 5.5" (1.664 m)   Wt 140 lb (63.5 kg)   SpO2 96%   BMI 22.94 kg/m   Wt Readings from Last 3 Encounters:  03/13/19 140 lb (63.5 kg)  07/05/18 148 lb 9 oz (67.4 kg)  02/16/18 148 lb (67.1 kg)    Physical Exam Vitals signs and nursing note reviewed.  Constitutional:      General: He is not in acute distress.    Appearance: Normal appearance. He is well-developed. He is not  ill-appearing.  HENT:     Head: Normocephalic and atraumatic.     Right Ear: Hearing, tympanic membrane, ear canal and external ear normal.     Left Ear: Hearing, tympanic membrane, ear canal and external ear normal.     Nose: Nose normal.     Mouth/Throat:     Mouth: Mucous membranes are moist.     Pharynx: Oropharynx is clear. Uvula midline. No posterior oropharyngeal erythema.  Eyes:     General: No scleral icterus.    Conjunctiva/sclera: Conjunctivae normal.     Pupils: Pupils are equal, round, and reactive to light.  Neck:     Musculoskeletal: Normal range of motion and neck supple.     Vascular: No carotid bruit.  Cardiovascular:     Rate and Rhythm: Normal rate and regular rhythm.     Pulses: Normal pulses.          Radial pulses are 2+ on the right side and 2+ on the left side.     Heart sounds: Normal heart sounds. No murmur.  Pulmonary:     Effort: Pulmonary effort is normal. No respiratory distress.     Breath sounds: Normal breath sounds. No wheezing, rhonchi or rales.  Abdominal:     General: Abdomen is flat. Bowel sounds are normal. There is no distension.     Palpations: Abdomen is soft. There is no mass.     Tenderness: There is no abdominal tenderness. There is no guarding or rebound.     Hernia: No hernia is present.  Musculoskeletal: Normal range of motion.  Lymphadenopathy:     Cervical: No cervical adenopathy.  Skin:    General: Skin is warm and dry.     Findings: No rash.  Neurological:     General: No focal deficit present.     Mental Status: He is alert and oriented to person, place, and time.     Comments:  CN grossly intact, station and gait intact Recall 3/3 Calculation 3/5 DLORW  Psychiatric:        Mood and Affect: Mood normal.        Behavior: Behavior normal.        Thought Content: Thought content normal.        Judgment: Judgment normal.       Results for orders placed or performed in visit on 11/17/18  HIV 1 RNA quant-no reflex-bld   Result Value Ref Range   HIV 1 RNA Quant <20 NOT DETECTED NOT DETECT copies/mL   HIV-1 RNA Quant, Log <1.30  NOT DETECTED NOT DETECT Log copies/mL  RPR  Result Value Ref Range   RPR Ser Ql REACTIVE (A) NON-REACTI  Comprehensive metabolic panel  Result Value Ref Range   Glucose, Bld 88 65 - 99 mg/dL   BUN 15 7 - 25 mg/dL   Creat 1.29 (H) 0.70 - 1.18 mg/dL   BUN/Creatinine Ratio 12 6 - 22 (calc)   Sodium 143 135 - 146 mmol/L   Potassium 3.6 3.5 - 5.3 mmol/L   Chloride 106 98 - 110 mmol/L   CO2 29 20 - 32 mmol/L   Calcium 9.1 8.6 - 10.3 mg/dL   Total Protein 6.7 6.1 - 8.1 g/dL   Albumin 3.7 3.6 - 5.1 g/dL   Globulin 3.0 1.9 - 3.7 g/dL (calc)   AG Ratio 1.2 1.0 - 2.5 (calc)   Total Bilirubin 0.6 0.2 - 1.2 mg/dL   Alkaline phosphatase (APISO) 74 35 - 144 U/L   AST 19 10 - 35 U/L   ALT 17 9 - 46 U/L  T-helper cell (CD4)- ([facility] clinic only)  Result Value Ref Range   CD4 T Cell Abs 976 400 - 1,790 /uL   CD4 % Helper T Cell 41 33 - 65 %  CBC  Result Value Ref Range   WBC 7.2 3.8 - 10.8 Thousand/uL   RBC 4.67 4.20 - 5.80 Million/uL   Hemoglobin 13.2 13.2 - 17.1 g/dL   HCT 41.3 38.5 - 50.0 %   MCV 88.4 80.0 - 100.0 fL   MCH 28.3 27.0 - 33.0 pg   MCHC 32.0 32.0 - 36.0 g/dL   RDW 14.0 11.0 - 15.0 %   Platelets 213 140 - 400 Thousand/uL   MPV 12.2 7.5 - 12.5 fL  Rpr titer  Result Value Ref Range   RPR Titer 1:2 (H)   Fluorescent treponemal ab(fta)-IgG-bld  Result Value Ref Range   Fluorescent Treponemal ABS REACTIVE (A) NON-REACTI   Assessment & Plan:   Problem List Items Addressed This Visit    Prostate cancer ([place])    Continue yearly uro f/u (Ottelin) s/p brachytherapy      Medicare annual wellness visit, subsequent - Primary    I have personally reviewed the Medicare Annual Wellness questionnaire and have noted 1. The patient's medical and social history 2. Their use of alcohol, tobacco or illicit drugs 3. Their current medications and supplements 4. The patient's  functional ability including ADL's, fall risks, home safety risks and hearing or visual impairment. Cognitive function has been assessed and addressed as indicated.  5. Diet and physical activity 6. Evidence for depression or mood disorders The patients weight, height, BMI have been recorded in the chart. I have made referrals, counseling and provided education to the patient based on review of the above and I have provided the pt with a written personalized care plan for preventive services. Provider list updated.. See scanned questionairre as needed for further documentation. Reviewed preventative protocols and updated unless pt declined.       Human immunodeficiency virus (HIV) disease ([place])    Appreciate ID care.       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 Continue statin and ACEI      History of rectal cancer   Essential hypertension    Chronic, stable on lisinopril - continue.       Relevant Orders   Lipid panel   COPD with emphysema ([place])    Chronic, stable off respiratory  medication.       Advanced care planning/counseling discussion    Advanced directive discussion - doesn't have set up, agrees to packet. would want wife then grand daughter to be HCPOA - rocky relationship with wife. Doesn't want prolonged life support if terminal condition.Ok for trial if he could improve.        Other Visit Diagnoses    Need for influenza vaccination       Relevant Orders   Flu Vaccine QUAD High Dose(Fluad) (Completed)   Renal insufficiency       Relevant Orders   Renal panel       No orders of the defined types were placed in this encounter.  Orders Placed This Encounter  Procedures  . Flu Vaccine QUAD High Dose(Fluad)  . Renal panel  . Lipid panel    Patient instructions: Flu shot today Labs today If interested, check with pharmacy about new 2 shot shingles series (shingrix).  Work on advanced directive -  packet provided today Increase water intake.  You are doing well today Return as needed or in 1 year for next wellness visit.  Follow up plan: Return in about 1 year (around 03/12/2020) for medicare wellness visit.  [name], MD

## 2019-03-13 NOTE — Assessment & Plan Note (Signed)
Continues full dose aspirin after TIA 11/2017. Continue statin and ACEI

## 2019-03-13 NOTE — Assessment & Plan Note (Signed)

## 2019-03-13 NOTE — Assessment & Plan Note (Signed)
Appreciate ID care.  

## 2019-03-13 NOTE — Assessment & Plan Note (Signed)
Chronic, stable off respiratory medication. 

## 2019-03-13 NOTE — Assessment & Plan Note (Signed)
Continue yearly uro f/u Karsten Ro) s/p brachytherapy

## 2019-03-14 LAB — RENAL FUNCTION PANEL
Albumin: 3.9 g/dL (ref 3.5–5.2)
BUN: 13 mg/dL (ref 6–23)
CO2: 33 mEq/L — ABNORMAL HIGH (ref 19–32)
Calcium: 9 mg/dL (ref 8.4–10.5)
Chloride: 101 mEq/L (ref 96–112)
Creatinine, Ser: 1.31 mg/dL (ref 0.40–1.50)
GFR: 63.88 mL/min (ref >60.00)
Glucose, Bld: 102 mg/dL — ABNORMAL HIGH (ref 70–99)
Phosphorus: 3.8 mg/dL (ref 2.3–4.6)
Potassium: 3.6 mEq/L (ref 3.5–5.1)
Sodium: 140 mEq/L (ref 135–145)

## 2019-03-14 LAB — LIPID PANEL
Cholesterol: 85 mg/dL (ref 0–200)
HDL: 31.8 mg/dL — ABNORMAL LOW (ref >39.00)
LDL Cholesterol: 36 mg/dL (ref 0–99)
NonHDL: 53.1
Total CHOL/HDL Ratio: 3
Triglycerides: 88 mg/dL (ref 0.0–149.0)
VLDL: 17.6 mg/dL (ref 0.0–40.0)

## 2019-03-14 MED FILL — GENVOYA TABLET: 150-150-200 | 30 days supply | Qty: 30 | Fill #4

## 2019-04-06 ENCOUNTER — Other Ambulatory Visit: Payer: Self-pay | Admitting: Family Medicine

## 2019-04-07 MED FILL — GENVOYA TABLET: 150-150-200 | 30 days supply | Qty: 30 | Fill #5

## 2019-05-03 ENCOUNTER — Other Ambulatory Visit: Payer: Self-pay | Admitting: Infectious Diseases

## 2019-05-03 DIAGNOSIS — B20 Human immunodeficiency virus [HIV] disease: Secondary | ICD-10-CM

## 2019-05-04 MED FILL — GENVOYA TABLET: 150-150-200 | 30 days supply | Qty: 30 | Fill #0

## 2019-05-08 ENCOUNTER — Other Ambulatory Visit: Payer: Self-pay | Admitting: Family Medicine

## 2019-05-08 ENCOUNTER — Other Ambulatory Visit: Payer: Self-pay | Admitting: Podiatry

## 2019-05-08 DIAGNOSIS — I10 Essential (primary) hypertension: Secondary | ICD-10-CM

## 2019-05-15 ENCOUNTER — Other Ambulatory Visit: Payer: Self-pay

## 2019-05-15 ENCOUNTER — Emergency Department: Payer: Medicare Other

## 2019-05-15 ENCOUNTER — Observation Stay
Admission: EM | Admit: 2019-05-15 | Discharge: 2019-05-16 | Disposition: A | Discharge status: Home / Self Care | Payer: Medicare Other | Attending: Internal Medicine | Admitting: Internal Medicine

## 2019-05-15 ENCOUNTER — Inpatient Hospital Stay: Payer: Medicare Other

## 2019-05-15 ENCOUNTER — Inpatient Hospital Stay
Admit: 2019-05-15 | Discharge: 2019-05-15 | Disposition: A | Discharge status: Home / Self Care | Payer: Medicare Other | Attending: Internal Medicine | Admitting: Internal Medicine

## 2019-05-15 ENCOUNTER — Telehealth: Payer: Self-pay

## 2019-05-15 DIAGNOSIS — Z7982 Long term (current) use of aspirin: Secondary | ICD-10-CM | POA: Insufficient documentation

## 2019-05-15 DIAGNOSIS — R270 Ataxia, unspecified: Secondary | ICD-10-CM | POA: Insufficient documentation

## 2019-05-15 DIAGNOSIS — I10 Essential (primary) hypertension: Secondary | ICD-10-CM | POA: Insufficient documentation

## 2019-05-15 DIAGNOSIS — I081 Rheumatic disorders of both mitral and tricuspid valves: Secondary | ICD-10-CM | POA: Diagnosis not present

## 2019-05-15 DIAGNOSIS — I639 Cerebral infarction, unspecified: Secondary | ICD-10-CM

## 2019-05-15 DIAGNOSIS — I739 Peripheral vascular disease, unspecified: Secondary | ICD-10-CM | POA: Diagnosis not present

## 2019-05-15 DIAGNOSIS — Z20822 Contact with and (suspected) exposure to covid-19: Secondary | ICD-10-CM | POA: Diagnosis not present

## 2019-05-15 DIAGNOSIS — H539 Unspecified visual disturbance: Secondary | ICD-10-CM

## 2019-05-15 DIAGNOSIS — R9431 Abnormal electrocardiogram [ECG] [EKG]: Secondary | ICD-10-CM | POA: Insufficient documentation

## 2019-05-15 DIAGNOSIS — I6389 Other cerebral infarction: Principal | ICD-10-CM | POA: Insufficient documentation

## 2019-05-15 DIAGNOSIS — Z8619 Personal history of other infectious and parasitic diseases: Secondary | ICD-10-CM | POA: Diagnosis not present

## 2019-05-15 DIAGNOSIS — Z8673 Personal history of transient ischemic attack (TIA), and cerebral infarction without residual deficits: Secondary | ICD-10-CM | POA: Insufficient documentation

## 2019-05-15 DIAGNOSIS — Z79899 Other long term (current) drug therapy: Secondary | ICD-10-CM | POA: Diagnosis not present

## 2019-05-15 DIAGNOSIS — J439 Emphysema, unspecified: Secondary | ICD-10-CM | POA: Insufficient documentation

## 2019-05-15 DIAGNOSIS — Z85038 Personal history of other malignant neoplasm of large intestine: Secondary | ICD-10-CM | POA: Diagnosis not present

## 2019-05-15 DIAGNOSIS — Z21 Asymptomatic human immunodeficiency virus [HIV] infection status: Secondary | ICD-10-CM | POA: Insufficient documentation

## 2019-05-15 DIAGNOSIS — F17210 Nicotine dependence, cigarettes, uncomplicated: Secondary | ICD-10-CM | POA: Insufficient documentation

## 2019-05-15 DIAGNOSIS — R531 Weakness: Secondary | ICD-10-CM | POA: Diagnosis not present

## 2019-05-15 DIAGNOSIS — Z803 Family history of malignant neoplasm of breast: Secondary | ICD-10-CM | POA: Insufficient documentation

## 2019-05-15 DIAGNOSIS — H538 Other visual disturbances: Secondary | ICD-10-CM | POA: Diagnosis not present

## 2019-05-15 DIAGNOSIS — Z791 Long term (current) use of non-steroidal anti-inflammatories (NSAID): Secondary | ICD-10-CM | POA: Insufficient documentation

## 2019-05-15 DIAGNOSIS — B20 Human immunodeficiency virus [HIV] disease: Secondary | ICD-10-CM | POA: Diagnosis present

## 2019-05-15 DIAGNOSIS — R7303 Prediabetes: Secondary | ICD-10-CM | POA: Insufficient documentation

## 2019-05-15 DIAGNOSIS — Z8615 Personal history of latent tuberculosis infection: Secondary | ICD-10-CM | POA: Insufficient documentation

## 2019-05-15 DIAGNOSIS — J329 Chronic sinusitis, unspecified: Secondary | ICD-10-CM | POA: Diagnosis not present

## 2019-05-15 DIAGNOSIS — Z8546 Personal history of malignant neoplasm of prostate: Secondary | ICD-10-CM | POA: Insufficient documentation

## 2019-05-15 DIAGNOSIS — Z9049 Acquired absence of other specified parts of digestive tract: Secondary | ICD-10-CM | POA: Insufficient documentation

## 2019-05-15 DIAGNOSIS — H532 Diplopia: Secondary | ICD-10-CM | POA: Diagnosis not present

## 2019-05-15 DIAGNOSIS — Z7902 Long term (current) use of antithrombotics/antiplatelets: Secondary | ICD-10-CM | POA: Insufficient documentation

## 2019-05-15 DIAGNOSIS — I6523 Occlusion and stenosis of bilateral carotid arteries: Secondary | ICD-10-CM | POA: Diagnosis not present

## 2019-05-15 DIAGNOSIS — I700 Atherosclerosis of aorta: Secondary | ICD-10-CM | POA: Insufficient documentation

## 2019-05-15 DIAGNOSIS — M4802 Spinal stenosis, cervical region: Secondary | ICD-10-CM | POA: Insufficient documentation

## 2019-05-15 DIAGNOSIS — C61 Malignant neoplasm of prostate: Secondary | ICD-10-CM | POA: Diagnosis present

## 2019-05-15 DIAGNOSIS — Z720 Tobacco use: Secondary | ICD-10-CM

## 2019-05-15 DIAGNOSIS — Z8042 Family history of malignant neoplasm of prostate: Secondary | ICD-10-CM | POA: Insufficient documentation

## 2019-05-15 DIAGNOSIS — Z03818 Encounter for observation for suspected exposure to other biological agents ruled out: Secondary | ICD-10-CM | POA: Diagnosis not present

## 2019-05-15 LAB — COMPREHENSIVE METABOLIC PANEL
ALT: 21 U/L (ref 0–44)
AST: 20 U/L (ref 15–41)
Albumin: 3.9 g/dL (ref 3.5–5.0)
Alkaline Phosphatase: 67 U/L (ref 38–126)
Anion gap: 9 (ref 5–15)
BUN: 13 mg/dL (ref 8–23)
CO2: 31 mmol/L (ref 22–32)
Calcium: 9.1 mg/dL (ref 8.9–10.3)
Chloride: 102 mmol/L (ref 98–111)
Creatinine, Ser: 1.23 mg/dL (ref 0.61–1.24)
GFR calc Af Amer: >60 mL/min (ref >60)
GFR calc non Af Amer: 55 mL/min — ABNORMAL LOW (ref >60)
Glucose, Bld: 99 mg/dL (ref 70–99)
Potassium: 3.7 mmol/L (ref 3.5–5.1)
Sodium: 142 mmol/L (ref 135–145)
Total Bilirubin: 0.9 mg/dL (ref 0.3–1.2)
Total Protein: 7.7 g/dL (ref 6.5–8.1)

## 2019-05-15 LAB — CBC
HCT: 45.2 % (ref 39.0–52.0)
Hemoglobin: 14.2 g/dL (ref 13.0–17.0)
MCH: 27.7 pg (ref 26.0–34.0)
MCHC: 31.4 g/dL (ref 30.0–36.0)
MCV: 88.1 fL (ref 80.0–100.0)
Platelets: 193 10*3/uL (ref 150–400)
RBC: 5.13 MIL/uL (ref 4.22–5.81)
RDW: 14 % (ref 11.5–15.5)
WBC: 7.7 10*3/uL (ref 4.0–10.5)
nRBC: 0 % (ref 0.0–0.2)

## 2019-05-15 LAB — DIFFERENTIAL
Abs Immature Granulocytes: 0.02 10*3/uL (ref 0.00–0.07)
Basophils Absolute: 0.1 10*3/uL (ref 0.0–0.1)
Basophils Relative: 1 %
Eosinophils Absolute: 0.4 10*3/uL (ref 0.0–0.5)
Eosinophils Relative: 6 %
Immature Granulocytes: 0 %
Lymphocytes Relative: 29 %
Lymphs Abs: 2.2 10*3/uL (ref 0.7–4.0)
Monocytes Absolute: 0.6 10*3/uL (ref 0.1–1.0)
Monocytes Relative: 8 %
Neutro Abs: 4.3 10*3/uL (ref 1.7–7.7)
Neutrophils Relative %: 56 %

## 2019-05-15 LAB — ECHOCARDIOGRAM COMPLETE
Height: 66 in
Weight: 2240 oz

## 2019-05-15 LAB — PROTIME-INR
INR: 1.1 (ref 0.8–1.2)
Prothrombin Time: 14 seconds (ref 11.4–15.2)

## 2019-05-15 LAB — GLUCOSE, CAPILLARY: Glucose-Capillary: 101 mg/dL — ABNORMAL HIGH (ref 70–99)

## 2019-05-15 LAB — APTT: aPTT: 32 seconds (ref 24–36)

## 2019-05-15 MED ORDER — ASPIRIN EC 81 MG PO TBEC
81.0000 mg | DELAYED_RELEASE_TABLET | Freq: Every day | ORAL | Status: DC
  Administered 2019-05-16: 81 mg via ORAL
  Filled 2019-05-15: qty 1

## 2019-05-15 MED ORDER — FUROSEMIDE 10 MG/ML IJ SOLN
40.0000 mg | Freq: Once | INTRAMUSCULAR | Status: DC
  Filled 2019-05-15: qty 4

## 2019-05-15 MED ORDER — STROKE: EARLY STAGES OF RECOVERY BOOK: Freq: Once | Status: DC

## 2019-05-15 MED ORDER — CLOPIDOGREL BISULFATE 75 MG PO TABS: 75.0000 mg | ORAL_TABLET | Freq: Every day | ORAL | Status: DC

## 2019-05-15 MED ORDER — SODIUM CHLORIDE 0.9% FLUSH
3.0000 mL | Freq: Once | INTRAVENOUS | Status: DC
Start: 2019-05-15 — End: 2019-05-15

## 2019-05-15 MED ORDER — ACETAMINOPHEN 650 MG RE SUPP: 650.0000 mg | RECTAL | Status: DC | PRN

## 2019-05-15 MED ORDER — IOHEXOL 350 MG/ML SOLN
75.0000 mL | Freq: Once | INTRAVENOUS | Status: AC | PRN
  Administered 2019-05-15: 75 mL via INTRAVENOUS

## 2019-05-15 MED ORDER — ENOXAPARIN SODIUM 40 MG/0.4ML ~~LOC~~ SOLN
40.0000 mg | SUBCUTANEOUS | Status: DC
  Administered 2019-05-15: 40 mg via SUBCUTANEOUS
  Filled 2019-05-15: qty 0.4

## 2019-05-15 MED ORDER — SODIUM CHLORIDE 0.9 % IV SOLN: INTRAVENOUS | Status: DC

## 2019-05-15 MED ORDER — ATORVASTATIN CALCIUM 20 MG PO TABS: 80.0000 mg | ORAL_TABLET | Freq: Every day | ORAL | Status: DC

## 2019-05-15 MED ORDER — ASPIRIN 81 MG PO CHEW
324.0000 mg | CHEWABLE_TABLET | Freq: Once | ORAL | Status: AC
  Administered 2019-05-15: 324 mg via ORAL
  Filled 2019-05-15: qty 4

## 2019-05-15 MED ORDER — ASPIRIN EC 325 MG PO TBEC: 325.0000 mg | DELAYED_RELEASE_TABLET | Freq: Every day | ORAL | Status: DC

## 2019-05-15 MED ORDER — ACETAMINOPHEN 160 MG/5ML PO SOLN
650.0000 mg | ORAL | Status: DC | PRN
  Filled 2019-05-15: qty 20.3

## 2019-05-15 MED ORDER — SENNOSIDES-DOCUSATE SODIUM 8.6-50 MG PO TABS: 1.0000 | ORAL_TABLET | Freq: Every evening | ORAL | Status: DC | PRN

## 2019-05-15 MED ORDER — ATORVASTATIN CALCIUM 20 MG PO TABS
40.0000 mg | ORAL_TABLET | Freq: Every day | ORAL | Status: DC
  Administered 2019-05-15: 40 mg via ORAL
  Filled 2019-05-15: qty 2

## 2019-05-15 MED ORDER — CLOPIDOGREL BISULFATE 75 MG PO TABS
300.0000 mg | ORAL_TABLET | Freq: Once | ORAL | Status: AC
  Administered 2019-05-15: 300 mg via ORAL
  Filled 2019-05-15: qty 4

## 2019-05-15 MED ORDER — CLOPIDOGREL BISULFATE 75 MG PO TABS
75.0000 mg | ORAL_TABLET | Freq: Every day | ORAL | Status: DC
  Administered 2019-05-16: 75 mg via ORAL
  Filled 2019-05-15: qty 1

## 2019-05-15 MED ORDER — ELVITEG-COBIC-EMTRICIT-TENOFAF 150-150-200-10 MG PO TABS
1.0000 | ORAL_TABLET | Freq: Every day | ORAL | Status: DC
  Administered 2019-05-16: 1 via ORAL
  Filled 2019-05-15: qty 1

## 2019-05-15 MED ORDER — ACETAMINOPHEN 325 MG PO TABS
650.0000 mg | ORAL_TABLET | ORAL | Status: DC | PRN
  Administered 2019-05-16: 650 mg via ORAL
  Filled 2019-05-15: qty 2
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
## 2019-05-15 NOTE — ED Notes (Signed)
Secure message sent to admitting MD about plavix dosage. Pt is supposed to get 300 mg loading dose today and started 75 mg QD tomorrow. Message sent to pharmacy to reschedule dose.

## 2019-05-15 NOTE — ED Notes (Signed)
Ultrasound at bedside
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
## 2019-05-15 NOTE — Progress Notes (Signed)
*  PRELIMINARY RESULTS* Echocardiogram 2D Echocardiogram has been performed.  Jonette Mate Manford Sprong 05/15/2019, 7:08 PM

## 2019-05-15 NOTE — ED Notes (Signed)
Pt transported to CT ?

## 2019-05-15 NOTE — ED Notes (Signed)
Report to Annie, RN.
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
## 2019-05-15 NOTE — ED Notes (Signed)
Patient transported to MRI 

## 2019-05-15 NOTE — ED Notes (Signed)
ED Provider at bedside. 

## 2019-05-15 NOTE — ED Notes (Signed)
Neurologist at bedside. 

## 2019-05-15 NOTE — ED Notes (Signed)
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 1.90 cm  Neoma Laming MD Electronically signed by Neoma Laming MD Signature Date/Time: 05/15/2019/8:55:47 PM    Final     Pending Labs Unresulted Labs (From admission, onward)    Start     Ordered   05/22/19 0500  Creatinine, serum  (enoxaparin (LOVENOX)    CrCl >/= 30 ml/min)  Weekly,   STAT    Comments: while on enoxaparin therapy    05/15/19 1618   05/16/19 0500  Hemoglobin A1c  Tomorrow morning,   STAT     05/15/19 1618   05/16/19 0500  Lipid panel  Tomorrow morning,   STAT    Comments: Fasting    05/15/19 1618   05/15/19 1616  CBC  (enoxaparin (LOVENOX)    CrCl >/= 30 ml/min)  Once,   STAT    Comments: Baseline for enoxaparin therapy IF NOT ALREADY DRAWN.  Notify MD if PLT < 100 K.    05/15/19 1618   05/15/19 1616  Creatinine, serum  (enoxaparin (LOVENOX)    CrCl >/= 30 ml/min)  Once,   STAT    Comments: Baseline for enoxaparin therapy IF NOT ALREADY DRAWN.    05/15/19 1618   05/15/19 1510  SARS CORONAVIRUS 2 (TAT 6-24 HRS) Nasopharyngeal Nasopharyngeal Swab  (Tier 3  (TAT 6-24 hrs))  ONCE - STAT,   STAT    Question Answer Comment  Is this test for diagnosis or screening Screening   Symptomatic for COVID-19 as defined by CDC No   Hospitalized for COVID-19 No   Admitted to ICU for COVID-19 No   Previously tested for COVID-19 No   Resident in a congregate (group) care setting No   Employed in healthcare setting No      05/15/19 1510          Vitals/Pain Today's Vitals   05/15/19 1559 05/15/19 1945 05/15/19 2000 05/15/19 2210  BP: (!) 157/99     Pulse: 69 67 67 64  Resp: 16     Temp:      TempSrc:      SpO2: 97% 97% 97% 96%  Weight:      Height:      PainSc: 0-No pain       Isolation Precautions No active  isolations  Medications Medications   stroke: mapping our early stages of recovery book ( Does not apply Not Given 05/15/19 1751)  0.9 %  sodium chloride infusion ( Intravenous New Bag/Given 05/15/19 1806)  acetaminophen (TYLENOL) tablet 650 mg (has no administration in time range)    Or  acetaminophen (TYLENOL) 160 MG/5ML solution 650 mg (has no administration in time range)    Or  acetaminophen (TYLENOL) suppository 650 mg (has no administration in time range)  senna-docusate (Senokot-S) tablet 1 tablet (has no administration in time range)  enoxaparin (LOVENOX) injection 40 mg (40 mg Subcutaneous Given 05/15/19 1804)  elvitegravir-cobicistat-emtricitabine-tenofovir (GENVOYA) 150-150-200-10 MG tablet 1 tablet (has no administration in time range)  aspirin EC tablet 81 mg (has no administration in time range)  clopidogrel (PLAVIX) tablet 75 mg (has no administration in time range)  atorvastatin (LIPITOR) tablet 40 mg (40 mg Oral Given 05/15/19 1804)  aspirin chewable tablet 324 mg (324 mg Oral Given 05/15/19 1558)  iohexol (OMNIPAQUE) 350 MG/ML injection 75 mL (75 mLs Intravenous Contrast Given 05/15/19 1614)  clopidogrel (PLAVIX) tablet 300 mg (300 mg Oral Given 05/15/19 1803)    Mobility walks Low fall risk   Focused  Assessments Neuro Assessment Handoff:  Swallow screen pass? Yes    NIH Stroke Scale ( + Modified Stroke Scale Criteria)  Interval: Initial Level of Consciousness (1a.)   : Alert, keenly responsive LOC Questions (1b. )   +: Answers both questions correctly LOC Commands (1c. )   + : Performs both tasks correctly Best Gaze (2. )  +: Normal Visual (3. )  +: No visual loss Facial Palsy (4. )    : Normal symmetrical movements Motor Arm, Left (5a. )   +: No drift Motor Arm, Right (5b. )   +: No drift Motor Leg, Left (6a. )   +: No drift Motor Leg, Right (6b. )   +: No drift Limb Ataxia (7. ): Absent Sensory (8. )   +: Normal, no sensory loss Best Language (9. )   +: No aphasia Dysarthria (10. ): Normal Extinction/Inattention (11.)   +: No Abnormality Modified SS Total  +: 0 Complete NIHSS TOTAL: 0 Last date known well: 05/14/19 Last time known well: 1630 Neuro Assessment:   Neuro Checks:   Initial (05/15/19 1310)  Last Documented NIHSS Modified Score: 0 (05/15/19 1310) Has TPA been given? No If patient is a Neuro Trauma and patient is going to OR before floor call report to [place] nurse: [phone] or [phone]     R Recommendations: See Admitting Provider Note  Report given to:   Additional Notes:

## 2019-05-15 NOTE — ED Notes (Signed)
Sent rainbow to the lab. 

## 2019-05-15 NOTE — Telephone Encounter (Signed)
Pt was stopped on way to work this morning by police for driving in 2 lanes; pt said he was seeing 2 lanes or seeing double, pt is staggery, no balance and pain in lt leg. No H/A,CP or SOB. Pt will go to [facility] for eval and possible testing or imaging. Unable to reach pts daughter, pts brother in law cannot take pt and pts wife cannot drive due to having surgery. pts wife tried to reach [place] her daughter, not able to reach her daughter.pt said no one at work can take pt to "ED. Mrs Vaziri advised to call 911. I called 911 and pt will be at [facility] front entrance for pick up. Pt is at front entrance with the school nurse. I let Mrs Rippeon know that 911 was going to pick pt up and the school nurse was with the pt now. Mrs Downen voiced understanding and was appreciative. FYI to Dr Darnell Level.

## 2019-05-15 NOTE — ED Notes (Signed)
Denies blurry vision at this time. Able to ambulate with ease.

## 2019-05-15 NOTE — ED Triage Notes (Signed)
Pt states he woke up with feeling off balance stubbling around this morning, states a police officer pulled him over today because he was crossing lanes, states he is having double vision. Pt denies HA, denies any sx last night.

## 2019-05-15 NOTE — ED Notes (Signed)
Report to Angela, RN

## 2019-05-15 NOTE — Telephone Encounter (Signed)
Noted. Thanks. Will await ER evaluation.

## 2019-05-15 NOTE — ED Notes (Signed)
Admitting MD at bedside.

## 2019-05-16 DIAGNOSIS — Z720 Tobacco use: Secondary | ICD-10-CM | POA: Diagnosis present

## 2019-05-16 DIAGNOSIS — I6389 Other cerebral infarction: Secondary | ICD-10-CM | POA: Diagnosis not present

## 2019-05-16 DIAGNOSIS — I639 Cerebral infarction, unspecified: Secondary | ICD-10-CM

## 2019-05-16 DIAGNOSIS — I10 Essential (primary) hypertension: Secondary | ICD-10-CM | POA: Diagnosis not present

## 2019-05-16 DIAGNOSIS — B20 Human immunodeficiency virus [HIV] disease: Secondary | ICD-10-CM | POA: Diagnosis not present

## 2019-05-16 LAB — LIPID PANEL
Cholesterol: 87 mg/dL (ref 0–200)
HDL: 30 mg/dL — ABNORMAL LOW
LDL Cholesterol: 40 mg/dL (ref 0–99)
Total CHOL/HDL Ratio: 2.9 ratio
Triglycerides: 83 mg/dL
VLDL: 17 mg/dL (ref 0–40)

## 2019-05-16 LAB — HEMOGLOBIN A1C
Hgb A1c MFr Bld: 5.9 % — ABNORMAL HIGH (ref 4.8–5.6)
Mean Plasma Glucose: 122.63 mg/dL

## 2019-05-16 LAB — SARS CORONAVIRUS 2 (TAT 6-24 HRS): SARS Coronavirus 2: NEGATIVE

## 2019-05-16 MED ORDER — ASPIRIN 81 MG PO TBEC: 81.0000 mg | DELAYED_RELEASE_TABLET | Freq: Every day | ORAL | 0 refills | Status: DC

## 2019-05-16 MED ORDER — NICOTINE 14 MG/24HR TD PT24: 14.0000 mg | MEDICATED_PATCH | TRANSDERMAL | 0 refills | Status: DC

## 2019-05-16 MED ORDER — CLOPIDOGREL BISULFATE 75 MG PO TABS: 75.0000 mg | ORAL_TABLET | Freq: Every day | ORAL | 2 refills | Status: DC

## 2019-05-16 NOTE — Progress Notes (Signed)
Subjective: Internal medicine discussed with ID, unlikely that syphilis is playing a role as simply evidence of previous infection.   Exam: Vitals:   05/16/19 0500 05/16/19 0800  BP: (!) 164/92 (!) 180/102  Pulse: 63 63  Resp:  14  Temp:    SpO2: 96% 93%   Gen: In bed, NAD Resp: non-labored breathing, no acute distress Abd: soft, nt  Neuro: MS: awake, alert, appropriate [id number] without diploplia today.  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  He will need to be managed with risk factor modification including hypertension control, follow-up of his screening for diabetes, and continued lipid management.  Recommendations: 1) dual antiplatelet therapy with aspirin and Plavix for 3 weeks followed by Plavix monotherapy 2) no need for more aggressive lipid management 3) OT 4) call with further questions or concerns  Roland Rack, MD [facility] [phone]  If 7pm- 7am, please page neurology on call as listed in [place].

## 2019-05-16 NOTE — ED Notes (Signed)
Pt resting in bed, given remote for tv. VSS.

## 2019-05-16 NOTE — Discharge Summary (Signed)
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    PV Peak grad:   2.8 mmHg AV Area (VTI):     2.55 cm    RVOT Peak grad: 1 mmHg AV Vmax:           99.00 cm/s AV Vmean:          63.100 cm/s AV VTI:            0.201 m AV Peak Grad:      3.9 mmHg AV Mean Grad:      2.0 mmHg LVOT Vmax:         87.90 cm/s LVOT Vmean:        63.600 cm/s LVOT VTI:          0.181 m LVOT/AV VTI ratio: 0.90  AORTA Ao Root diam: 3.20 cm MITRAL VALVE                         TRICUSPID VALVE MV Area (PHT): 4.10 cm  TR Peak grad:   22.9 mmHg MV PHT:        53.65 msec            TR Vmax:        248.00 cm/s MV Decel Time: 185 msec MV E velocity: 61.60 cm/s  103 cm/s  SHUNTS MV A velocity: 100.00 cm/s 70.3 cm/s Systemic VTI:  0.18 m MV E/A ratio:  0.62        1.5       Systemic Diam: 1.90 cm  Neoma Laming MD Electronically signed by Neoma Laming MD Signature Date/Time: 05/15/2019/8:55:47 PM    Final        Subjective: Patient seen and examined.  Denies further diplopia, dizziness or unsteady gait.  No weakness.  Discharge Exam: Vitals:   05/16/19 1000 05/16/19 1203  BP: (!) 195/113 (!) 181/104  Pulse: 81 80  Resp: (!) 21 (!) 27  Temp:     SpO2: 100% 98%   Vitals:   05/16/19 0916 05/16/19 0947 05/16/19 1000 05/16/19 1203  BP: (!) 184/97 (!) 141/114 (!) 195/113 (!) 181/104  Pulse: 74 79 81 80  Resp: 13 (!) 23 (!) 21 (!) 27  Temp:      TempSrc:      SpO2: 97% 96% 100% 98%  Weight:      Height:        General: Elderly male not in distress HEENT: Moist mucosa, supple neck, clear field of vision Chest: Clear bilaterally CVs: Normal S1-S2 GI: Soft, nondistended, nontender, Musculoskeletal: Warm, no edema CNS: Alert and oriented,, no focal deficit, normal gait    The results of significant diagnostics from this hospitalization (including imaging, microbiology, ancillary and laboratory) are listed below for reference.     Microbiology: Recent Results (from the past 240 hour(s))  SARS CORONAVIRUS 2 (TAT 6-24 HRS) Nasopharyngeal Nasopharyngeal Swab     Status: None   Collection Time: 05/15/19  3:55 PM   Specimen: Nasopharyngeal Swab  Result Value Ref Range Status   SARS Coronavirus 2 NEGATIVE NEGATIVE Final    Comment: (NOTE) SARS-CoV-2 target nucleic acids are NOT DETECTED. The SARS-CoV-2 RNA is generally detectable in upper and lower respiratory specimens during the acute phase of infection. Negative results do not preclude SARS-CoV-2 infection, do not rule out co-infections with other pathogens, and should not be used as the sole basis for treatment or other patient management decisions. Negative results must be combined with clinical observations, patient history, and epidemiological information. The expected result is Negative. Fact Sheet for Patients: SugarRoll.be Fact Sheet for Healthcare Providers: https://www.woods-mathews.com/ This test is not yet approved or cleared by the [place] FDA and  has been authorized for detection and/or diagnosis of SARS-CoV-2 by FDA under an Emergency Use Authorization (EUA). This EUA will remain  in effect (meaning this test  can be used) for the duration of the COVID-19 declaration under Section 56 4(b)(1) of the Act, 21 U.S.C. section 360bbb-3(b)(1), unless the authorization is terminated or revoked sooner. Performed at [facility], [place] [address], [place], [place] [address]      Labs: BNP (last 3 results) No results for input(s): BNP in the last 8760 hours. Basic Metabolic Panel: Recent Labs  Lab 05/15/19 0940  NA 142  K 3.7  CL 102  CO2 31  GLUCOSE 99  BUN 13  CREATININE 1.23  CALCIUM 9.1   Liver Function Tests: Recent Labs  Lab 05/15/19 0940  AST 20  ALT 21  ALKPHOS 67  BILITOT 0.9  PROT 7.7  ALBUMIN 3.9   No results for input(s): LIPASE, AMYLASE in the last 168 hours. No results for input(s): AMMONIA in the last 168 hours. CBC: Recent Labs  Lab 05/15/19 0940  WBC 7.7  NEUTROABS 4.3  HGB 14.2  HCT 45.2  MCV 88.1  PLT 193   Cardiac Enzymes: No results for input(s): CKTOTAL, CKMB, CKMBINDEX, TROPONINI in the last 168 hours. BNP: Invalid input(s): POCBNP CBG: Recent Labs  Lab 05/15/19 0953  GLUCAP 101*   D-Dimer No results for input(s): DDIMER in the last 72 hours. Hgb A1c Recent Labs    05/16/19 0458  HGBA1C 5.9*   Lipid Profile Recent Labs    05/16/19 0458  CHOL 87  HDL 30*  LDLCALC 40  TRIG 83  CHOLHDL 2.9   Thyroid function studies No results for input(s): TSH, T4TOTAL, T3FREE, THYROIDAB in the last 72 hours.  Invalid input(s): FREET3 Anemia work up No results for input(s): VITAMINB12, FOLATE, FERRITIN, TIBC, IRON, RETICCTPCT in the last 72 hours. Urinalysis    Component Value Date/Time   COLORURINE YELLOW (A) 11/18/2017 1925   APPEARANCEUR CLEAR (A) 11/18/2017 1925   LABSPEC 1.020 11/18/2017 1925   PHURINE 6.0 11/18/2017 1925   GLUCOSEU NEGATIVE 11/18/2017 1925   HGBUR NEGATIVE 11/18/2017 1925   BILIRUBINUR NEGATIVE 11/18/2017 1925   BILIRUBINUR Negative 12/21/2013 1748   KETONESUR NEGATIVE 11/18/2017 1925   PROTEINUR NEGATIVE  11/18/2017 1925   UROBILINOGEN 0.2 12/21/2013 1748   NITRITE NEGATIVE 11/18/2017 1925   LEUKOCYTESUR NEGATIVE 11/18/2017 1925   Sepsis Labs Invalid input(s): PROCALCITONIN,  WBC,  LACTICIDVEN Microbiology Recent Results (from the past 240 hour(s))  SARS CORONAVIRUS 2 (TAT 6-24 HRS) Nasopharyngeal Nasopharyngeal Swab     Status: None   Collection Time: 05/15/19  3:55 PM   Specimen: Nasopharyngeal Swab  Result Value Ref Range Status   SARS Coronavirus 2 NEGATIVE NEGATIVE Final    Comment: (NOTE) SARS-CoV-2 target nucleic acids are NOT DETECTED. The SARS-CoV-2 RNA is generally detectable in upper and lower respiratory specimens during the acute phase of infection. Negative results do not preclude SARS-CoV-2 infection, do not rule out co-infections with other pathogens, and should not be used as the sole basis for treatment or other patient management decisions. Negative results must be combined with clinical observations, patient history, and epidemiological information. The expected result is Negative. Fact Sheet for Patients: SugarRoll.be Fact Sheet for Healthcare Providers: https://www.woods-mathews.com/ This test is not yet approved or cleared by the [place] FDA and  has been authorized for detection and/or diagnosis of SARS-CoV-2 by FDA under an Emergency Use Authorization (EUA). This EUA will remain  in effect (meaning this test can be used) for the duration of the COVID-19 declaration under Section 56 4(b)(1) of the Act, 21 U.S.C. section 360bbb-3(b)(1), unless the authorization is terminated or revoked sooner. Performed at [facility], [place] [address] [place], [place] [address]      Time coordinating discharge: 30 minutes  SIGNED:   Louellen Molder, MD  [facility] 05/16/2019, 2:15 PM Pager   If 7PM-7AM, please contact night-coverage [email or web address] Password [email or web address]

## 2019-05-16 NOTE — Evaluation (Signed)
Physical Therapy Evaluation Patient Details Name: Charles Daniel MRN: [id number] DOB: [birth year] Today's Date: 05/16/2019   History of Present Illness  pt presented to ED after being pulled by police on way to work for driving in between lanes, pt reporting double vision in both eyes. denies any HA, dizziness, blurry vision, SOB, weakness or numbness in extremities. 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 Pt reports living with his wife who is physical unable to provide assistance but he does have a daughter nearby who can help PRN. Pt lives in a one level home with level entry. 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 Pt denies any falls in the last year. Pt independent with bed mobility and min guard for transfers. Pt reports feeling more unsteady than his baseline but has no concerns for returning home. Pt BP sitting EOB 141/114 with nursing notified who then notified MD. BP with initial standing 199/98 and after ~54min static standing 195/113. Further OOB mobility deferred secondary to elevated BP. Pt reports he normally takes medication for his BP but has not had anything at time of eval. Nursing aware. Nursing reported that she saw pt ambulate down hall to the restroom and back earlier this morning with no LOB and no AD. Pt able to maintain standing balance with narrow BOS without UE support but presents with marked increased sway with semi tandem stance.  Pt presents with mild decreased balance and stating decreased balance but would benefit from further skilled assessment and assessment of dynamic balance when BP more stable. Pt reporting that his double vision has improved however he still has had short bouts of double vision that come and go. Pt educated  on risk factors for stroke including HTN and prior stroke. Pt would benefit from further skilled acute PT for assessment of higher level balance challenges, dynamic balance and safety with ambulation. Recommendation for outpatient PT for further strengthening and balance with pt educated on recommendation.     Follow Up Recommendations Outpatient PT    Equipment Recommendations  None recommended by PT    Recommendations for Other Services       Precautions / Restrictions Precautions Precautions: Fall Restrictions Weight Bearing Restrictions: No      Mobility  Bed Mobility Overal bed mobility: Independent             General bed mobility comments: ind getting EOB  Transfers Overall transfer level: Modified independent Equipment used: None             General transfer comment: min guard for safety, no physical assist needed and overall steady with STS from gurney  Ambulation/Gait             General Gait Details: per nursing pt ambulated without AD down hallway to restroom and back to his room without unsteadiness or LOB. ambulation deferred in eval due to acute HTN  Stairs            Wheelchair Mobility    Modified Rankin (Stroke Patients Only)       Balance Overall balance assessment: Mild deficits observed, not formally tested(able to maintain balance without UE with feet in narrow BOS, increased sway with semi tandem stance)  Pertinent Vitals/Pain Pain Assessment: No/denies pain    Home Living Family/patient expects to be discharged to:: Private residence Living Arrangements: Spouse/significant other Available Help at Discharge: Family;Available PRN/intermittently;Other (Comment)(daughter PRN, spouse available 24/7 but pt reporting she unable to physically assist) Type of Home: House Home Access: Level entry     Home Layout: One level Home Equipment: None      Prior Function  Level of Independence: Independent         Comments: pt reports being independent in all ADLs, 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, ambulating without AD, denies any falls last year     Hand Dominance        Extremity/Trunk Assessment   Upper Extremity Assessment Upper Extremity Assessment: Overall WFL for tasks assessed;Defer to OT evaluation    Lower Extremity Assessment Lower Extremity Assessment: Overall WFL for tasks assessed(B LE grossly 4/5)       Communication   Communication: No difficulties  Cognition Arousal/Alertness: Awake/alert Behavior During Therapy: WFL for tasks assessed/performed Overall Cognitive Status: Within Functional Limits for tasks assessed                                        General Comments      Exercises     Assessment/Plan    PT Assessment Patient needs continued PT services  PT Problem List Decreased strength;Decreased mobility;Decreased range of motion;Decreased activity tolerance;Decreased balance;Decreased knowledge of use of DME;Cardiopulmonary status limiting activity       PT Treatment Interventions DME instruction;Therapeutic exercise;Gait training;Balance training;Stair training;Neuromuscular re-education;Functional mobility training;Therapeutic activities;Patient/family education    PT Goals (Current goals can be found in the Care Plan section)  Acute Rehab PT Goals Patient Stated Goal: go home PT Goal Formulation: With patient Time For Goal Achievement: 05/30/19 Potential to Achieve Goals: Good    Frequency 7X/week   Barriers to discharge        Co-evaluation               AM-PAC PT "6 Clicks" Mobility  Outcome Measure Help needed turning from your back to your side while in a flat bed without using bedrails?: None Help needed moving from lying on your back to sitting on the side of a flat bed without using bedrails?: None Help needed moving to and from  a bed to a chair (including a wheelchair)?: None Help needed standing up from a chair using your arms (e.g., wheelchair or bedside chair)?: None Help needed to walk in hospital room?: A Little Help needed climbing 3-5 steps with a railing? : A Little 6 Click Score: 22    End of Session Equipment Utilized During Treatment: Gait belt Activity Tolerance: Patient tolerated treatment well;Treatment limited secondary to medical complications (Comment)(acute HTN) Patient left: in bed;with call bell/phone within reach(on [employer] in ED) Nurse Communication: Mobility status PT Visit Diagnosis: Unsteadiness on feet (R26.81)    Time: 0940-1005 PT Time Calculation (min) (ACUTE ONLY): 25 min   Charges:   PT Evaluation $PT Eval Moderate Complexity: 1 Mod PT Treatments $Therapeutic Activity: 8-22 mins        Copelan Maultsby PT, DPT 12:09 PM,05/16/19 [phone]   Tamsen Reist M [place] Stroope 05/16/2019, 12:08 PM

## 2019-05-16 NOTE — Discharge Instructions (Signed)

## 2019-05-16 NOTE — Care Management CC44 (Signed)
Condition Code 44 Documentation Completed  Patient Details  Name: Charles Daniel MRN: [id number] Date of Birth: 02/10/1941   Condition Code 44 given:  Yes Patient signature on Condition Code 44 notice:  Yes Documentation of 2 MD's agreement:  Yes Code 44 added to claim:  Yes    Shelbie Ammons, RN 05/16/2019, 4:37 PM

## 2019-05-16 NOTE — Progress Notes (Addendum)
SLP Cancellation Note  Patient Details Name: Charles Daniel MRN: [id number] DOB: 04/18/1941   Cancelled treatment:       Reason Eval/Treat Not Completed: SLP screened, no needs identified, will sign off(chart reviewed; consulted NSG then met w/ pt in room).  Pt denied any difficulty swallowing and is currently on a regular diet; tolerates swallowing pills w/ water per NSG. Pt eating an egg sandwich during this screening and swallowing multiple pills w/ water via straw NSG present. Pt conversed in conversation w/ SLP and NSG answering general questions w/out deficits noted. Pt denied any language deficits.  No further skilled ST services indicated as pt appears at his baseline. Pt agreed. NSG to reconsult if any change in status.      Katherine Watson, MS, CCC-SLP Watson,Katherine 05/16/2019, 9:36 AM   

## 2019-05-16 NOTE — ED Notes (Signed)
Pt resting in bed, no needs at this time.

## 2019-05-16 NOTE — Evaluation (Signed)
Occupational Therapy Evaluation Patient Details Name: Charles Daniel MRN: [id number] DOB: Jan 28, 1941 Today's Date: 05/16/2019    History of Present Illness Pt is 79 y/o M who presented to ED after being pulled over by police on way to work for driving in between lanes, pt reporting double vision in both eyes. denies any HA, dizziness, SOB, weakness or numbness in extremities. 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   Clinical Impression   Pt was seen for OT evaluation this date. Prior to hospital admission, pt reports being Indep with self care and ADL mobiltiy. Pt lives with spouse in [facility] with level entry. Currently pt demonstrates impairments as described below (See OT problem list below) which functionally limit her ability to perform ADL/self-care tasks. Pt currently requires SBA to CGA with fxl mobility within room and hallway to/from restroom and SBA for standing self care ADLs.  Pt does not appear to require further skilled OT in acute setting as he is primarily at his reported baseline.  Upon hospital discharge, recommend pt discharge  home, no OT f/u appears necessary at this time.    Follow Up Recommendations  No OT follow up    Equipment Recommendations  None recommended by OT    Recommendations for Other Services       Precautions / Restrictions Precautions Precautions: Fall Restrictions Weight Bearing Restrictions: No      Mobility Bed Mobility Overal bed mobility: Independent             General bed mobility comments: pt sitting at end of bed unsupported with feet planted with normal static sitting balance.  Transfers Overall transfer level: Needs assistance Equipment used: None Transfers: Sit to/from Stand Sit to Stand: Min guard         General transfer comment: Initially, CGA provided, but no LOB, no sway. Pt ultimatley  performs all other transfers with Supv/SBA only.    Balance Overall balance assessment: Mild deficits observed, not formally tested                                         ADL either performed or assessed with clinical judgement   ADL                                         General ADL Comments: Pt performs fxl mobility with SBA, requires one verbal cue in hallway for obstacle avoidance. Performs standing self care including peri care, clothing mgt (including buttoning and zipping), and standing hand hygiene at sink-side. Pt demos G static standing balance with no AD     Vision   Additional Comments: pt reports "seeing better" at this time, but does endorse presenting with double vision bilaterally initially     Perception     Praxis      Pertinent Vitals/Pain Pain Assessment: No/denies pain     Hand Dominance     Extremity/Trunk Assessment Upper Extremity Assessment Upper Extremity Assessment: Overall WFL for tasks assessed   Lower Extremity Assessment Lower Extremity Assessment: Overall WFL for tasks assessed       Communication Communication Communication: No difficulties   Cognition Arousal/Alertness: Awake/alert Behavior During Therapy: WFL for tasks assessed/performed Overall Cognitive Status: Within Functional  Limits for tasks assessed                                     General Comments       Exercises Other Exercises Other Exercises: OT facilitates education with pt : safety and fall prevention with pt demonstrating good understanding and using call bell approriately to avoid trippin gover lines/tubes.   Shoulder Instructions      Home Living Family/patient expects to be discharged to:: Private residence Living Arrangements: Spouse/significant other Available Help at Discharge: Family;Available PRN/intermittently;Other (Comment)(dtr available to assist PRN, spouse present 24/7, but pt reports cannot  physically assist.) Type of Home: House Home Access: Level entry     Home Layout: One level     Bathroom Shower/Tub: Teacher, early years/pre: Standard     Home Equipment: None          Prior Functioning/Environment Level of Independence: Independent        Comments: pt reports being independent in all ADLs, 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, ambulating without AD, denies any falls last year        OT Problem List: Decreased activity tolerance      OT Treatment/Interventions:      OT Goals(Current goals can be found in the care plan section) Acute Rehab OT Goals Patient Stated Goal: go home OT Goal Formulation: All assessment and education complete, DC therapy  OT Frequency:     Barriers to D/C:            Co-evaluation              AM-PAC OT "6 Clicks" Daily Activity     Outcome Measure Help from another person eating meals?: None Help from another person taking care of personal grooming?: None Help from another person toileting, which includes using toliet, bedpan, or urinal?: None Help from another person bathing (including washing, rinsing, drying)?: A Little Help from another person to put on and taking off regular upper body clothing?: None Help from another person to put on and taking off regular lower body clothing?: None 6 Click Score: 23   End of Session Equipment Utilized During Treatment: Gait belt  Activity Tolerance: Patient tolerated treatment well Patient left: in bed;with call bell/phone within reach(seated EOB (stretcher) with feet planted on ground and N static sitting balance.)  OT Visit Diagnosis: Muscle weakness (generalized) (M62.81)                Time: [id number] OT Time Calculation (min): 29 min Charges:  OT General Charges $OT Visit: 1 Visit OT Evaluation $OT Eval Moderate Complexity: 1 Mod OT Treatments $Self Care/Home Management : 8-22 mins  Gerrianne Scale, [place], OTR/L ascom  [phone] 05/16/19, 2:13 PM

## 2019-05-16 NOTE — Care Management Obs Status (Signed)
[place] NOTIFICATION   Patient Details  Name: Charles Daniel MRN: [id number] Date of Birth: 06/16/1940   Medicare Observation Status Notification Given:  Fernande Boyden, RN 05/16/2019, 4:37 PM

## 2019-05-18 DIAGNOSIS — R2681 Unsteadiness on feet: Secondary | ICD-10-CM | POA: Diagnosis not present

## 2019-05-18 DIAGNOSIS — Z8673 Personal history of transient ischemic attack (TIA), and cerebral infarction without residual deficits: Secondary | ICD-10-CM | POA: Diagnosis not present

## 2019-05-18 DIAGNOSIS — I10 Essential (primary) hypertension: Secondary | ICD-10-CM | POA: Diagnosis not present

## 2019-05-18 DIAGNOSIS — H532 Diplopia: Secondary | ICD-10-CM | POA: Diagnosis not present

## 2019-05-18 DIAGNOSIS — Z720 Tobacco use: Secondary | ICD-10-CM | POA: Diagnosis not present

## 2019-05-20 ENCOUNTER — Telehealth: Payer: Self-pay | Admitting: Family Medicine

## 2019-05-20 ENCOUNTER — Other Ambulatory Visit: Payer: Self-pay | Admitting: Family Medicine

## 2019-05-20 DIAGNOSIS — I639 Cerebral infarction, unspecified: Secondary | ICD-10-CM

## 2019-05-20 NOTE — Telephone Encounter (Signed)
Received notice that pt was hospitalized for acute stroke, discharged 05/16/2019.  I have signed order for outpatient physical therapy.  plz call to schedule hospital f/u visit. Outside of window for TCM phone call.

## 2019-05-22 NOTE — Telephone Encounter (Signed)
Spoke with pt scheduling hosp f/u on 06/02/19 at 3:30.

## 2019-05-23 DIAGNOSIS — C61 Malignant neoplasm of prostate: Secondary | ICD-10-CM | POA: Diagnosis not present

## 2019-05-30 DIAGNOSIS — C61 Malignant neoplasm of prostate: Secondary | ICD-10-CM | POA: Diagnosis not present

## 2019-05-31 MED FILL — GENVOYA TABLET: 150-150-200 | 30 days supply | Qty: 30 | Fill #1

## 2019-06-02 ENCOUNTER — Encounter: Payer: Self-pay | Admitting: Family Medicine

## 2019-06-02 ENCOUNTER — Other Ambulatory Visit: Payer: Self-pay

## 2019-06-02 ENCOUNTER — Ambulatory Visit (INDEPENDENT_AMBULATORY_CARE_PROVIDER_SITE_OTHER): Payer: Medicare Other | Admitting: Family Medicine

## 2019-06-02 VITALS — BP 132/80 | HR 60 | Temp 97.8°F | Ht 66.0 in | Wt 141.2 lb

## 2019-06-02 DIAGNOSIS — Z8673 Personal history of transient ischemic attack (TIA), and cerebral infarction without residual deficits: Secondary | ICD-10-CM

## 2019-06-02 DIAGNOSIS — I10 Essential (primary) hypertension: Secondary | ICD-10-CM | POA: Diagnosis not present

## 2019-06-02 DIAGNOSIS — Z720 Tobacco use: Secondary | ICD-10-CM | POA: Diagnosis not present

## 2019-06-02 DIAGNOSIS — I639 Cerebral infarction, unspecified: Secondary | ICD-10-CM

## 2019-06-02 NOTE — Progress Notes (Signed)
This visit was conducted in person.  BP 132/80 (BP Location: Left Arm, Patient Position: Sitting, Cuff Size: Normal)   Pulse 60   Temp 97.8 F (36.6 C) (Temporal)   Ht 5\' 6"  (1.676 m)   Wt 141 lb 3 oz (64 kg)   SpO2 94%   BMI 22.79 kg/m    CC: hosp f/u visit  Subjective:    Patient ID: Charles Daniel, male    DOB: [birth year], 79 y.o.   MRN: [id number]  HPI: Charles Daniel is a 79 y.o. male presenting on 06/02/2019 for Hospitalization Follow-up (Seen at [facility] ED on 05/15/19.)   Recent hospitalization for acute stroke that presented with double vision and unsteady gait. Pulled over by police due to difficulty driving, taken to the ER. Head imaging revealed acute infarct of R paramedian midbrain without bleed as well as prior remote cortical and R posterior frontal and R parieto-occipital junction regions and extensive chronic small vessel ischemic changes. His symptoms had largely resolved by the time he was evaluated at the ER. CTA neck with A999333 RICA and A999333 LICA stenosis, as well as pronounced ATH disease of R M1 artery. Echocardiogram with normal EF (55-60%) without wall motion abnormality. Stroke occurred while on full dose aspirin 325mg . He was loaded with plavix and discharged on aspirin 81mg /plavix for 3 wks with plan to transition to plavix alone afterwards.   Saw [facility] neurology Dr Melrose Nakayama in follow up - note reviewed. At that visit, BP was elevated so he was started on amlodipine 5mg  - he does not know if he's currently taking this. I asked him to verify at home and let us know.    Admit date: 05/15/2019 Discharge date: 05/16/2019 TCM hosp f/u phone call not completed Seen outside of window for TCM  Admitted From: Home Disposition: Home  Recommendations for Outpatient Follow-up:  Follow-up with PCP in 1 week.  Follow-up with neurology in 1-2 weeks. Please adjust blood pressure medications as outpatient given uncontrolled hypertension (patient is being allowed permissive blood  pressure for the next 3-5 days).  Home Health: Outpatient PT Equipment/Devices: None  Discharge Condition: Fair CODE STATUS: Full code Diet recommendation: Heart Healthy   Discharge Diagnoses:  Principal Problem:   Acute ischemic stroke ([place]) Active Problems:   Human immunodeficiency virus (HIV) disease ([place]) Uncontrolled hypertension   Prostate cancer ([place])   COPD with emphysema ([place])   Tobacco use Prediabetes (A1c 5.9)  Consult: Neurology Family communication: None Procedure: CT head, MRI brain, 2D echo, CT angiogram head and neck     Relevant past medical, surgical, family and social history reviewed and updated as indicated. Interim medical history since our last visit reviewed. Allergies and medications reviewed and updated. Outpatient Medications Prior to Visit  Medication Sig Dispense Refill  . aspirin 81 MG EC tablet Take 1 tablet (81 mg total) by mouth daily. 21 tablet 0  . atorvastatin (LIPITOR) 40 MG tablet TAKE 1 TABLET BY MOUTH EVERY DAY (Patient taking differently: Take 40 mg by mouth daily at 12 noon. ) 90 tablet 2  . clopidogrel (PLAVIX) 75 MG tablet Take 1 tablet (75 mg total) by mouth daily. 30 tablet 2  . GENVOYA 150-150-200-10 MG TABS tablet TAKE 1 TABLET BY MOUTH DAILY WITH BREAKFAST. (Patient taking differently: Take 1 tablet by mouth daily at 12 noon. ) 30 tablet 5  . hydrochlorothiazide (HYDRODIURIL) 25 MG tablet TAKE 1 TABLET BY MOUTH EVERY DAY (Patient taking differently: Take 25 mg by mouth daily. ) 90 tablet  1  . lisinopril (ZESTRIL) 20 MG tablet TAKE 1 TABLET (20 MG TOTAL) BY MOUTH DAILY. NEEDS AN APPOINTMENT 90 tablet 3  . methocarbamol (ROBAXIN) 500 MG tablet TAKE 1 TABLET (500 MG TOTAL) BY MOUTH 2 (TWO) TIMES DAILY AS NEEDED FOR MUSCLE SPASMS. 30 tablet 0  . meloxicam (MOBIC) 15 MG tablet TAKE 1 TABLET BY MOUTH EVERY DAY (Patient taking differently: Take 15 mg by mouth daily. ) 30 tablet 2  . amLODipine (NORVASC) 5 MG tablet Take 1 tablet (5 mg  total) by mouth daily.    . nicotine (NICODERM CQ - DOSED IN MG/24 HOURS) 14 mg/24hr patch Place 1 patch (14 mg total) onto the skin daily. 30 patch 0   No facility-administered medications prior to visit.     Per HPI unless specifically indicated in ROS section below Review of Systems Objective:    BP 132/80 (BP Location: Left Arm, Patient Position: Sitting, Cuff Size: Normal)   Pulse 60   Temp 97.8 F (36.6 C) (Temporal)   Ht 5\' 6"  (1.676 m)   Wt 141 lb 3 oz (64 kg)   SpO2 94%   BMI 22.79 kg/m   Wt Readings from Last 3 Encounters:  06/02/19 141 lb 3 oz (64 kg)  05/15/19 140 lb (63.5 kg)  03/13/19 140 lb (63.5 kg)    Physical Exam Vitals and nursing note reviewed.  Constitutional:      Appearance: Normal appearance. He is not ill-appearing.  HENT:     Head: Normocephalic and atraumatic.  Eyes:     Extraocular Movements: Extraocular movements intact.     Comments: Bilateral cataracts present - planning to establish with eye doctor  Cardiovascular:     Rate and Rhythm: Normal rate and regular rhythm.     Pulses: Normal pulses.     Heart sounds: Normal heart sounds. No murmur.  Pulmonary:     Effort: Pulmonary effort is normal. No respiratory distress.     Breath sounds: Normal breath sounds. No wheezing, rhonchi or rales.  Musculoskeletal:     Right lower leg: No edema.     Left lower leg: No edema.  Skin:    General: Skin is warm and dry.     Findings: No rash.  Neurological:     Mental Status: He is alert.  Psychiatric:        Mood and Affect: Mood normal.        Behavior: Behavior normal.       Assessment & Plan:  This visit occurred during the SARS-CoV-2 public health emergency.  Safety protocols were in place, including screening questions prior to the visit, additional usage of staff PPE, and extensive cleaning of exam room while observing appropriate contact time as indicated for disinfecting solutions.  Had been taking meloxicam 15mg  daily - advised stop  this in setting of recent stroke and aspirin + plavix use. Pt agrees. Only take tylenol from now on Problem List Items Addressed This Visit    Tobacco use    He had told me he quit smoking 2010 however it seems he had continued smoking. Now has quit 05/2019. Encouraged full cessation. Was sent home with nicotine patches from the hospital      History of stroke without residual deficits   Essential hypertension    Chronic, well controlled in office today - he is on lisinopril and hctz. Amlodipine was started earlier this month by neurology but pt is not sure if he's currently taking - advised to check  at home and let me know. As good BP control today, if not taking, will stay off; If taking, will continue.       Relevant Medications   amLODipine (NORVASC) 5 MG tablet   Acute ischemic stroke (HCC) - Primary    New stroke - blood thinner changed to aspirin + plavix for 3 wks. Has established with neurology, f/u planned next month. Reviewed smoking cessation, tight BP control and sugar/lipid control.       Relevant Medications   amLODipine (NORVASC) 5 MG tablet       No orders of the defined types were placed in this encounter.  No orders of the defined types were placed in this encounter.  Patient Instructions  You were previously taking lisinopril 20mg  daily and hydrochlorothiazide 25mg  daily. Dr Melrose Nakayama recently added amlodipine 5mg  daily to your blood pressure regimen. Check at home if you're taking this and let me know.  Stop meloxicam. Continue plavix and aspirin 81mg  for now.  Good to see you today. Return as needed or in 3-4 months for follow up visit.    Follow up plan: Return in about 3 months (around 08/31/2019) for follow up visit.  [name], MD

## 2019-06-02 NOTE — Patient Instructions (Addendum)
You were previously taking lisinopril 20mg  daily and hydrochlorothiazide 25mg  daily. Dr Melrose Nakayama recently added amlodipine 5mg  daily to your blood pressure regimen. Check at home if you're taking this and let me know.  Stop meloxicam. Continue plavix and aspirin 81mg  for now.  Good to see you today. Return as needed or in 3-4 months for follow up visit.

## 2019-06-03 NOTE — Assessment & Plan Note (Signed)
Chronic, well controlled in office today - he is on lisinopril and hctz. Amlodipine was started earlier this month by neurology but pt is not sure if he's currently taking - advised to check at home and let me know. As good BP control today, if not taking, will stay off; If taking, will continue.

## 2019-06-03 NOTE — Assessment & Plan Note (Addendum)
He had told me he quit smoking 2010 however it seems he had continued smoking. Now has quit 05/2019. Encouraged full cessation. Was sent home with nicotine patches from the hospital

## 2019-06-03 NOTE — Assessment & Plan Note (Signed)
New stroke - blood thinner changed to aspirin + plavix for 3 wks. Has established with neurology, f/u planned next month. Reviewed smoking cessation, tight BP control and sugar/lipid control.

## 2019-06-13 DIAGNOSIS — H2513 Age-related nuclear cataract, bilateral: Secondary | ICD-10-CM | POA: Diagnosis not present

## 2019-06-19 DIAGNOSIS — Z8673 Personal history of transient ischemic attack (TIA), and cerebral infarction without residual deficits: Secondary | ICD-10-CM | POA: Diagnosis not present

## 2019-06-19 DIAGNOSIS — Z23 Encounter for immunization: Secondary | ICD-10-CM | POA: Diagnosis not present

## 2019-06-19 DIAGNOSIS — I10 Essential (primary) hypertension: Secondary | ICD-10-CM | POA: Diagnosis not present

## 2019-06-28 MED FILL — GENVOYA TABLET: 150-150-200 | 30 days supply | Qty: 30 | Fill #2

## 2019-07-15 ENCOUNTER — Other Ambulatory Visit: Payer: Self-pay | Admitting: Family Medicine

## 2019-07-17 NOTE — Telephone Encounter (Signed)
Robaxin Last filled:  05/22/19, #30 Last OV:  06/02/19, hosp f/u Next OV:  09/04/19, 3 mo f/u

## 2019-07-19 DIAGNOSIS — Z23 Encounter for immunization: Secondary | ICD-10-CM | POA: Diagnosis not present

## 2019-07-24 MED FILL — GENVOYA TABLET: 150-150-200 | 30 days supply | Qty: 30 | Fill #3

## 2019-08-21 MED FILL — GENVOYA TABLET: 150-150-200 | 30 days supply | Qty: 30 | Fill #4

## 2019-08-31 ENCOUNTER — Encounter: Payer: Self-pay | Admitting: Gastroenterology

## 2019-09-04 ENCOUNTER — Ambulatory Visit (INDEPENDENT_AMBULATORY_CARE_PROVIDER_SITE_OTHER): Payer: Medicare Other | Admitting: Family Medicine

## 2019-09-04 ENCOUNTER — Encounter: Payer: Self-pay | Admitting: Family Medicine

## 2019-09-04 ENCOUNTER — Other Ambulatory Visit: Payer: Self-pay

## 2019-09-04 VITALS — BP 118/62 | HR 71 | Temp 97.8°F | Ht 66.0 in | Wt 141.3 lb

## 2019-09-04 DIAGNOSIS — I10 Essential (primary) hypertension: Secondary | ICD-10-CM | POA: Diagnosis not present

## 2019-09-04 DIAGNOSIS — Z8673 Personal history of transient ischemic attack (TIA), and cerebral infarction without residual deficits: Secondary | ICD-10-CM

## 2019-09-04 DIAGNOSIS — R0989 Other specified symptoms and signs involving the circulatory and respiratory systems: Secondary | ICD-10-CM | POA: Insufficient documentation

## 2019-09-04 DIAGNOSIS — I639 Cerebral infarction, unspecified: Secondary | ICD-10-CM

## 2019-09-04 DIAGNOSIS — Z720 Tobacco use: Secondary | ICD-10-CM | POA: Diagnosis not present

## 2019-09-04 NOTE — Assessment & Plan Note (Signed)
Chronic, stable. Continue current regimen. 

## 2019-09-04 NOTE — Patient Instructions (Addendum)
Continue current medicines. For decreased foot pulses, we will refer you for arterial circulation test in [place].  Return in 6 months for wellness visit

## 2019-09-04 NOTE — Assessment & Plan Note (Signed)
Continue to encourage full cessation. He states he is down to 2 cig/day.

## 2019-09-04 NOTE — Assessment & Plan Note (Deleted)
Continue aspirin + plavix. Continue statin, ACEI.

## 2019-09-04 NOTE — Assessment & Plan Note (Signed)
Continue aspirin + plavix. Continue statin, ACEI.

## 2019-09-04 NOTE — Assessment & Plan Note (Signed)
Will refer for arterial circulation evaluation.

## 2019-09-04 NOTE — Progress Notes (Signed)
This visit was conducted in person.  BP 118/62 (BP Location: Left Arm, Patient Position: Sitting, Cuff Size: Normal)   Pulse 71   Temp 97.8 F (36.6 C) (Temporal)   Ht 5\' 6"  (1.676 m)   Wt 141 lb 5 oz (64.1 kg)   SpO2 96%   BMI 22.81 kg/m    CC: 3 mo f/u visit  Subjective:    Patient ID: Charles Daniel, male    DOB: Apr 23, 1941, 79 y.o.   MRN: [id number]  HPI: Charles Daniel is a 79 y.o. male presenting on 09/04/2019 for Follow-up (Here for 3 mo f/u.)   Suffered acute ischemic stroke 05/2019 presenting with double vision and unsteady gait. Stroke while on full dose aspirin 325mg . Plan was plavix + aspirin x 3 wks then only on plavix, but it seems he's taking plavix + aspirin daily. Established with Dr Melrose Nakayama neurology, latest note reviewed.   Denies headaches, vision changes, chest pain, tightness, shortness of breath, leg pain or swelling.   Previously continued smoker - quit 05/2019. He has restarted - down to 2 a day  HTN - now on lisinopril/hctz and amlodipine.  He is compliant with all his medicines.  Completed moderna vaccine.   Prior stroke workup: Head imaging revealed acute infarct of R paramedian midbrain without bleed as well as prior remote cortical and R posterior frontal and R parieto-occipital junction regions and extensive chronic small vessel ischemic changes.  CTA neck with A999333 RICA and A999333 LICA stenosis, as well as pronounced ATH disease of R M1 artery. Echocardiogram with normal EF (55-60%) without wall motion abnormality.      Relevant past medical, surgical, family and social history reviewed and updated as indicated. Interim medical history since our last visit reviewed. Allergies and medications reviewed and updated. Outpatient Medications Prior to Visit  Medication Sig Dispense Refill  . amLODipine (NORVASC) 5 MG tablet Take 1 tablet (5 mg total) by mouth daily.    Marland Kitchen aspirin 81 MG EC tablet Take 1 tablet (81 mg total) by mouth daily. 21 tablet 0  .  atorvastatin (LIPITOR) 40 MG tablet TAKE 1 TABLET BY MOUTH EVERY DAY (Patient taking differently: Take 40 mg by mouth daily at 12 noon. ) 90 tablet 2  . clopidogrel (PLAVIX) 75 MG tablet Take 1 tablet (75 mg total) by mouth daily. 30 tablet 2  . GENVOYA 150-150-200-10 MG TABS tablet TAKE 1 TABLET BY MOUTH DAILY WITH BREAKFAST. (Patient taking differently: Take 1 tablet by mouth daily at 12 noon. ) 30 tablet 5  . hydrochlorothiazide (HYDRODIURIL) 25 MG tablet TAKE 1 TABLET BY MOUTH EVERY DAY (Patient taking differently: Take 25 mg by mouth daily. ) 90 tablet 1  . lisinopril (ZESTRIL) 20 MG tablet TAKE 1 TABLET (20 MG TOTAL) BY MOUTH DAILY. NEEDS AN APPOINTMENT 90 tablet 3  . methocarbamol (ROBAXIN) 500 MG tablet TAKE 1 TABLET (500 MG TOTAL) BY MOUTH 2 (TWO) TIMES DAILY AS NEEDED FOR MUSCLE SPASMS. 30 tablet 0   No facility-administered medications prior to visit.     Per HPI unless specifically indicated in ROS section below Review of Systems Objective:  BP 118/62 (BP Location: Left Arm, Patient Position: Sitting, Cuff Size: Normal)   Pulse 71   Temp 97.8 F (36.6 C) (Temporal)   Ht 5\' 6"  (1.676 m)   Wt 141 lb 5 oz (64.1 kg)   SpO2 96%   BMI 22.81 kg/m   Wt Readings from Last 3 Encounters:  09/04/19 141 lb 5 oz (  64.1 kg)  06/02/19 141 lb 3 oz (64 kg)  05/15/19 140 lb (63.5 kg)      Physical Exam Vitals and nursing note reviewed.  Constitutional:      Appearance: Normal appearance. He is not ill-appearing.  Cardiovascular:     Rate and Rhythm: Normal rate and regular rhythm.     Pulses: Normal pulses.     Heart sounds: Normal heart sounds. No murmur.  Pulmonary:     Effort: Pulmonary effort is normal. No respiratory distress.     Breath sounds: Normal breath sounds. No wheezing, rhonchi or rales.  Musculoskeletal:     Right lower leg: No edema.     Left lower leg: No edema.     Comments:  1+ DP bilaterally L>R calluses at soles  Skin:    General: Skin is warm and dry.      Findings: No rash.  Neurological:     Mental Status: He is alert.  Psychiatric:        Mood and Affect: Mood normal.        Behavior: Behavior normal.       Lab Results  Component Value Date   CREATININE 1.23 05/15/2019   BUN 13 05/15/2019   NA 142 05/15/2019   K 3.7 05/15/2019   CL 102 05/15/2019   CO2 31 05/15/2019    Assessment & Plan:  This visit occurred during the SARS-CoV-2 public health emergency.  Safety protocols were in place, including screening questions prior to the visit, additional usage of staff PPE, and extensive cleaning of exam room while observing appropriate contact time as indicated for disinfecting solutions.   Problem List Items Addressed This Visit    Tobacco use    Continue to encourage full cessation. He states he is down to 2 cig/day.       History of ischemic stroke    Continue aspirin + plavix. Continue statin, ACEI.       Essential hypertension - Primary    Chronic, stable. Continue current regimen.       Relevant Orders   VAS [place] ABI WITH/WO TBI   Diminished pulses in lower extremity    Will refer for arterial circulation evaluation.       Relevant Orders   VAS [place] ABI WITH/WO TBI       No orders of the defined types were placed in this encounter.  No orders of the defined types were placed in this encounter.  Patient Instructions  Continue current medicines. For decreased foot pulses, we will refer you for arterial circulation test in [place].  Return in 6 months for wellness visit    Follow up plan: Return in about 6 months (around 03/06/2020) for medicare wellness visit, follow up visit.  [name], MD

## 2019-09-06 ENCOUNTER — Other Ambulatory Visit: Payer: Self-pay | Admitting: Family Medicine

## 2019-09-06 DIAGNOSIS — R0989 Other specified symptoms and signs involving the circulatory and respiratory systems: Secondary | ICD-10-CM

## 2019-09-19 DIAGNOSIS — C61 Malignant neoplasm of prostate: Secondary | ICD-10-CM | POA: Diagnosis not present

## 2019-09-20 MED FILL — GENVOYA TABLET: 150-150-200 | 30 days supply | Qty: 30 | Fill #5

## 2019-09-22 ENCOUNTER — Other Ambulatory Visit (HOSPITAL_COMMUNITY): Payer: Self-pay | Admitting: Urology

## 2019-09-22 DIAGNOSIS — R9721 Rising PSA following treatment for malignant neoplasm of prostate: Secondary | ICD-10-CM

## 2019-09-22 DIAGNOSIS — Z8546 Personal history of malignant neoplasm of prostate: Secondary | ICD-10-CM

## 2019-10-06 ENCOUNTER — Other Ambulatory Visit (HOSPITAL_COMMUNITY): Payer: Medicare Other

## 2019-10-06 ENCOUNTER — Encounter (HOSPITAL_COMMUNITY): Payer: Medicare Other

## 2019-10-11 ENCOUNTER — Encounter (HOSPITAL_COMMUNITY): Payer: Medicare Other

## 2019-10-11 ENCOUNTER — Encounter (HOSPITAL_COMMUNITY): Payer: Self-pay

## 2019-10-11 DIAGNOSIS — K7689 Other specified diseases of liver: Secondary | ICD-10-CM | POA: Diagnosis not present

## 2019-10-11 DIAGNOSIS — D1809 Hemangioma of other sites: Secondary | ICD-10-CM | POA: Diagnosis not present

## 2019-10-11 DIAGNOSIS — D7389 Other diseases of spleen: Secondary | ICD-10-CM | POA: Diagnosis not present

## 2019-10-11 DIAGNOSIS — C61 Malignant neoplasm of prostate: Secondary | ICD-10-CM | POA: Diagnosis not present

## 2019-10-16 ENCOUNTER — Other Ambulatory Visit: Payer: Self-pay | Admitting: Internal Medicine

## 2019-10-16 DIAGNOSIS — B20 Human immunodeficiency virus [HIV] disease: Secondary | ICD-10-CM

## 2019-10-18 MED FILL — GENVOYA TABLET: 150-150-200 | 30 days supply | Qty: 30 | Fill #0

## 2019-11-04 ENCOUNTER — Other Ambulatory Visit: Payer: Self-pay | Admitting: Family Medicine

## 2019-11-04 DIAGNOSIS — I10 Essential (primary) hypertension: Secondary | ICD-10-CM

## 2019-11-13 MED FILL — GENVOYA TABLET: 150-150-200 | 30 days supply | Qty: 30 | Fill #1

## 2019-11-15 ENCOUNTER — Other Ambulatory Visit: Payer: Medicare Other

## 2019-11-23 DIAGNOSIS — C61 Malignant neoplasm of prostate: Secondary | ICD-10-CM | POA: Diagnosis not present

## 2019-11-30 ENCOUNTER — Encounter: Payer: Medicare Other | Admitting: Internal Medicine

## 2019-11-30 DIAGNOSIS — R351 Nocturia: Secondary | ICD-10-CM | POA: Diagnosis not present

## 2019-12-05 ENCOUNTER — Other Ambulatory Visit: Payer: Self-pay

## 2019-12-05 ENCOUNTER — Other Ambulatory Visit: Payer: Medicare Other

## 2019-12-05 ENCOUNTER — Other Ambulatory Visit: Payer: Self-pay | Admitting: *Deleted

## 2019-12-05 DIAGNOSIS — Z113 Encounter for screening for infections with a predominantly sexual mode of transmission: Secondary | ICD-10-CM | POA: Diagnosis not present

## 2019-12-05 DIAGNOSIS — B20 Human immunodeficiency virus [HIV] disease: Secondary | ICD-10-CM

## 2019-12-06 LAB — T-HELPER CELL (CD4) - (RCID CLINIC ONLY)
CD4 % Helper T Cell: 43 % (ref 33–65)
CD4 T Cell Abs: 1140 /uL (ref 400–1790)

## 2019-12-07 ENCOUNTER — Encounter (HOSPITAL_COMMUNITY): Payer: Medicare Other

## 2019-12-11 LAB — COMPLETE METABOLIC PANEL WITH GFR
AG Ratio: 1.1 (calc) (ref 1.0–2.5)
ALT: 15 U/L (ref 9–46)
AST: 17 U/L (ref 10–35)
Albumin: 4.1 g/dL (ref 3.6–5.1)
Alkaline phosphatase (APISO): 80 U/L (ref 35–144)
BUN/Creatinine Ratio: 12 (calc) (ref 6–22)
BUN: 16 mg/dL (ref 7–25)
CO2: 27 mmol/L (ref 20–32)
Calcium: 9.5 mg/dL (ref 8.6–10.3)
Chloride: 100 mmol/L (ref 98–110)
Creat: 1.32 mg/dL — ABNORMAL HIGH (ref 0.70–1.18)
GFR, Est African American: 59 mL/min/{1.73_m2} — ABNORMAL LOW (ref >=60)
GFR, Est Non African American: 51 mL/min/{1.73_m2} — ABNORMAL LOW (ref >=60)
Globulin: 3.7 g/dL (calc) (ref 1.9–3.7)
Glucose, Bld: 85 mg/dL (ref 65–99)
Potassium: 3.8 mmol/L (ref 3.5–5.3)
Sodium: 137 mmol/L (ref 135–146)
Total Bilirubin: 0.4 mg/dL (ref 0.2–1.2)
Total Protein: 7.8 g/dL (ref 6.1–8.1)

## 2019-12-11 LAB — RPR: RPR Ser Ql: REACTIVE — AB

## 2019-12-11 LAB — HIV-1 RNA QUANT-NO REFLEX-BLD
HIV 1 RNA Quant: <20 Copies/mL
HIV-1 RNA Quant, Log: <1.3 Log cps/mL

## 2019-12-11 LAB — CBC WITH DIFFERENTIAL/PLATELET
Absolute Monocytes: 539 cells/uL (ref 200–950)
Basophils Absolute: 100 cells/uL (ref 0–200)
Basophils Relative: 1.3 %
Eosinophils Absolute: 477 cells/uL (ref 15–500)
Eosinophils Relative: 6.2 %
HCT: 41.9 % (ref 38.5–50.0)
Hemoglobin: 13.8 g/dL (ref 13.2–17.1)
Lymphs Abs: 2672 cells/uL (ref 850–3900)
MCH: 28.2 pg (ref 27.0–33.0)
MCHC: 32.9 g/dL (ref 32.0–36.0)
MCV: 85.5 fL (ref 80.0–100.0)
MPV: 11.3 fL (ref 7.5–12.5)
Monocytes Relative: 7 %
Neutro Abs: 3912 cells/uL (ref 1500–7800)
Neutrophils Relative %: 50.8 %
Platelets: 260 10*3/uL (ref 140–400)
RBC: 4.9 10*6/uL (ref 4.20–5.80)
RDW: 14.6 % (ref 11.0–15.0)
Total Lymphocyte: 34.7 %
WBC: 7.7 10*3/uL (ref 3.8–10.8)

## 2019-12-11 LAB — FLUORESCENT TREPONEMAL AB(FTA)-IGG-BLD: Fluorescent Treponemal ABS: REACTIVE — AB

## 2019-12-11 LAB — RPR TITER: RPR Titer: 1:2 {titer} — ABNORMAL HIGH

## 2019-12-11 MED FILL — GENVOYA TABLET: 150-150-200 | 30 days supply | Qty: 30 | Fill #2

## 2019-12-18 ENCOUNTER — Other Ambulatory Visit: Payer: Self-pay

## 2019-12-18 ENCOUNTER — Encounter: Payer: Self-pay | Admitting: Internal Medicine

## 2019-12-18 ENCOUNTER — Ambulatory Visit (INDEPENDENT_AMBULATORY_CARE_PROVIDER_SITE_OTHER): Payer: Medicare Other | Admitting: Internal Medicine

## 2019-12-18 DIAGNOSIS — I639 Cerebral infarction, unspecified: Secondary | ICD-10-CM | POA: Diagnosis not present

## 2019-12-18 DIAGNOSIS — Z8673 Personal history of transient ischemic attack (TIA), and cerebral infarction without residual deficits: Secondary | ICD-10-CM

## 2019-12-18 DIAGNOSIS — B20 Human immunodeficiency virus [HIV] disease: Secondary | ICD-10-CM | POA: Diagnosis not present

## 2019-12-18 MED ORDER — GENVOYA 150-150-200-10 MG PO TABS: 1.0000 | ORAL_TABLET | Freq: Every day | ORAL | 11 refills | Status: DC

## 2019-12-18 NOTE — Assessment & Plan Note (Signed)
His infection remains under excellent, long-term control.  He will continue Genvoya and follow-up after lab work in 1 year. 

## 2019-12-18 NOTE — Progress Notes (Signed)
Patient Active Problem List   Diagnosis Date Noted  . History of rectal cancer 04/22/2010    Priority: High  . Human immunodeficiency virus (HIV) disease ([place]) 05/15/2006    Priority: High  . Essential hypertension 05/15/2006    Priority: High  . History of stroke without residual deficits 05/15/2006    Priority: High  . Diminished pulses in lower extremity 09/04/2019  . Tobacco use 05/16/2019  . History of ischemic stroke 05/15/2019  . 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 TIA (transient ischemic attack) 11/18/2017  . COPD with emphysema ([place]) 10/18/2015  . Advanced care planning/counseling discussion 06/26/2014  . 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 Prostate cancer ([place]) 11/08/2012  . Medicare annual wellness visit, subsequent 08/02/2012  . 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 WEIGHT LOSS 12/18/2008  . CONSTIPATION 03/16/2008  . TUBERCULOSIS 05/15/2006  . SYPHILIS 05/15/2006  . 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  . HEPATITIS B, HX OF 05/15/2006  . GASTROINTESTINAL HEMORRHAGE, HX OF 05/15/2006    Patient's Medications  New Prescriptions   No medications on file  Previous Medications   AMLODIPINE (NORVASC) 5 MG TABLET    Take 1 tablet (5 mg total) by mouth daily.   ASPIRIN 81 MG EC TABLET    Take 1 tablet (81 mg total) by mouth daily.   ATORVASTATIN (LIPITOR) 40 MG TABLET    TAKE 1 TABLET BY MOUTH EVERY DAY   CLOPIDOGREL (PLAVIX) 75 MG TABLET    Take 1 tablet (75 mg total) by mouth daily.   HYDROCHLOROTHIAZIDE (HYDRODIURIL) 25 MG TABLET    TAKE 1 TABLET BY MOUTH EVERY DAY   LISINOPRIL (ZESTRIL) 20 MG TABLET    TAKE 1 TABLET (20 MG TOTAL) BY MOUTH DAILY. NEEDS AN APPOINTMENT  Modified Medications   Modified Medication Previous Medication   GENVOYA 150-150-200-10 MG TABS TABLET GENVOYA 150-150-200-10 MG TABS tablet      Take 1 tablet by mouth daily with breakfast.    TAKE 1 TABLET BY MOUTH DAILY WITH BREAKFAST.  Discontinued  Medications   No medications on file    Subjective: Charles Daniel is in for his routine HIV follow-up visit.  He has not had any problems obtaining, taking or tolerating his Genvoya and does not miss doses.  He takes it each morning with breakfast.  He was hospitalized in January with a CVA.  Fortunately he had no residual effects.  He is still smoking 4 to 5 cigarettes daily.  He received a maternal Covid vaccine.  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  He is primarily been upset that he has not been able to see his family in 2 years because of the pandemic.  Review of Systems: Review of Systems  Psychiatric/Behavioral: Negative for depression.    Past Medical History:  Diagnosis Date  . Bilateral hydrocele 2012  . Depression   . Diverticulosis 2013   by colonoscopy  . Emphysema lung ([place]) 03/2014    by CXR, remote smoking history  . History of colon cancer    2007 --  S/P RECTOSIGMOID COLECTOMY--  NO CHEMORADIATION--  NO RECURRENCE  . History of CVA (cerebrovascular accident)    2003-  RIGHT MIDDLE CVA---   NO RESIDUAL  . History of hepatitis B    REMOTE AND INACTIVE  PER DOCUMENTATION  . History of syphilis    SECONDARY SYPHILITIS TX'D IN 1998  PER DOCUMENTATION  .  History of tuberculosis    LATENT TB  TX'D X12  MONTHS IN 1995  . HIV infection ([place])    [place] (DR Javyon Fontan)  . Hypertension   . Prostate carcinoma [facility]) dx 09/2012   T1c, brachytherapy/seed implant Karsten Ro, Tammi Klippel)    Social History   Tobacco Use  . Smoking status: Current Every Day Smoker    Packs/day: 0.30    Years: 35.00    Pack years: 10.50    Types: Cigarettes    Last attempt to quit: 03/04/2009    Years since quitting: 10.7  . Smokeless tobacco: Never Used  Vaping Use  . Vaping Use: Never used  Substance Use Topics  . Alcohol use: No    Alcohol/week: 0.0 standard drinks  . Drug use: No    Family History  Problem  Relation Age of Onset  . Cancer Sister        breast  . Breast cancer Sister   . Cancer Brother 36       prostate, treated with seed implant  . Cancer Brother        prostate  . Cancer Brother        prostate  . Cancer Daughter        breast  . Cancer Other        prostate  . Cancer Father        unsure  . CAD Neg Hx   . Stroke Neg Hx   . Diabetes Neg Hx     No Known Allergies  Health Maintenance  Topic Date Due  . DTAP VACCINES (1) 06/10/1941  . INFLUENZA VACCINE  12/03/2019  . DTaP/Tdap/Td (2 - Tdap) 08/03/2022  . TETANUS/TDAP  08/03/2022  . COVID-19 Vaccine  Completed  . Hepatitis C Screening  Completed  . PNA vac Low Risk Adult  Completed    Objective:  Vitals:   12/18/19 1538  BP: 129/72  Pulse: 81  Temp: 98.3 F (36.8 C)  TempSrc: Oral  Weight: 138 lb (62.6 kg)   Body mass index is 22.27 kg/m.  Physical Exam Constitutional:      Comments: He is in good spirits.  Cardiovascular:     Rate and Rhythm: Normal rate and regular rhythm.     Heart sounds: No murmur heard.   Pulmonary:     Effort: Pulmonary effort is normal.     Breath sounds: Normal breath sounds.  Abdominal:     Palpations: Abdomen is soft.     Tenderness: There is no abdominal tenderness.  Psychiatric:        Mood and Affect: Mood normal.     Lab Results Lab Results  Component Value Date   WBC 7.7 12/05/2019   HGB 13.8 12/05/2019   HCT 41.9 12/05/2019   MCV 85.5 12/05/2019   PLT 260 12/05/2019    Lab Results  Component Value Date   CREATININE 1.32 (H) 12/05/2019   BUN 16 12/05/2019   NA 137 12/05/2019   K 3.8 12/05/2019   CL 100 12/05/2019   CO2 27 12/05/2019    Lab Results  Component Value Date   ALT 15 12/05/2019   AST 17 12/05/2019   ALKPHOS 67 05/15/2019   BILITOT 0.4 12/05/2019    Lab Results  Component Value Date   CHOL 87 05/16/2019   HDL 30 (L) 05/16/2019   LDLCALC 40 05/16/2019   TRIG 83 05/16/2019   CHOLHDL 2.9 05/16/2019   Lab Results  Component Value Date   LABRPR REACTIVE (A) 12/05/2019   RPRTITER 1:2 (H) 12/05/2019   HIV 1 RNA Quant  Date Value  12/05/2019 <20 Copies/mL  11/17/2018 <20 NOT DETECTED copies/mL  11/09/2017 <20 DETECTED copies/mL (A)   CD4 T Cell Abs (/uL)  Date Value  12/05/2019 1,140  11/17/2018 976  11/09/2017 810     Problem List Items Addressed This Visit      High   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  He will continue Genvoya and follow-up after lab work in 1 year.      Relevant Medications   GENVOYA 150-150-200-10 MG TABS tablet   Other Relevant Orders   CBC   T-helper cell (CD4)- ([facility] clinic only)   Comprehensive metabolic panel   RPR   HIV-1 RNA quant-no reflex-bld     Unprioritized   History of ischemic stroke    I asked him to schedule an appointment with his PCP as soon as possible.  I talked to him at length about the importance of cigarette cessation and making sure all risk factors for a future stroke are being addressed appropriately.           Michel Bickers, MD [facility] [place] [facility] [phone] pager   [phone] cell 12/18/2019, 4:12 PM

## 2019-12-18 NOTE — Assessment & Plan Note (Signed)
I asked him to schedule an appointment with his PCP as soon as possible.  I talked to him at length about the importance of cigarette cessation and making sure all risk factors for a future stroke are being addressed appropriately.

## 2019-12-26 ENCOUNTER — Other Ambulatory Visit: Payer: Self-pay

## 2019-12-26 ENCOUNTER — Ambulatory Visit (INDEPENDENT_AMBULATORY_CARE_PROVIDER_SITE_OTHER): Payer: Medicare Other | Admitting: Family Medicine

## 2019-12-26 ENCOUNTER — Encounter: Payer: Self-pay | Admitting: Family Medicine

## 2019-12-26 VITALS — BP 120/68 | HR 70 | Temp 97.9°F | Ht 66.0 in | Wt 136.5 lb

## 2019-12-26 DIAGNOSIS — R0989 Other specified symptoms and signs involving the circulatory and respiratory systems: Secondary | ICD-10-CM

## 2019-12-26 DIAGNOSIS — Z720 Tobacco use: Secondary | ICD-10-CM

## 2019-12-26 DIAGNOSIS — Z8673 Personal history of transient ischemic attack (TIA), and cerebral infarction without residual deficits: Secondary | ICD-10-CM | POA: Diagnosis not present

## 2019-12-26 DIAGNOSIS — I639 Cerebral infarction, unspecified: Secondary | ICD-10-CM | POA: Diagnosis not present

## 2019-12-26 MED ORDER — BUPROPION HCL ER (SR) 100 MG PO TB12
ORAL_TABLET | ORAL | 1 refills | Status: DC
Start: 2019-12-26 — End: 2020-03-15

## 2019-12-26 NOTE — Progress Notes (Signed)
This visit was conducted in person.  BP 120/68 (BP Location: Left Arm, Patient Position: Sitting, Cuff Size: Normal)   Pulse 70   Temp 97.9 F (36.6 C) (Temporal)   Ht 5\' 6"  (1.676 m)   Wt 136 lb 8 oz (61.9 kg)   SpO2 96%   BMI 22.03 kg/m    CC: discuss smoking cessation Subjective:    Patient ID: Charles Daniel, male    DOB: November 01, 1940, 79 y.o.   MRN: [id number]  HPI: Charles Daniel is a 79 y.o. male presenting on 12/26/2019 for Follow-up (Wants to discuss help to stop smoking. )   Current smoker started 30+ yrs ago - currently 1 pp week. Smoking about 4-5 per day. Uses mint when he gets urge to quit.   No h/o seizures.  Wife doesn't knows he smokes.   Suffered acute ischemic stroke 05/2019 while on full dose aspirin 325mg . plavix was added. Continues plavix + aspirin 81mg .   Has not completed LE arterial circulation (ABIs ordered 09/2019) - encouraged he call back to reschedule.   Prior stroke workup: Head imaging revealed acute infarct of R paramedian midbrain without bleed as well as prior remote cortical and R posterior frontal and R parieto-occipital junction regions and extensive chronic small vessel ischemic changes.  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 Echocardiogram with normal EF (55-60%) without wall motion abnormality.      Relevant past medical, surgical, family and social history reviewed and updated as indicated. Interim medical history since our last visit reviewed. Allergies and medications reviewed and updated. Outpatient Medications Prior to Visit  Medication Sig Dispense Refill  . amLODipine (NORVASC) 5 MG tablet Take 1 tablet (5 mg total) by mouth daily.    Marland Kitchen atorvastatin (LIPITOR) 40 MG tablet TAKE 1 TABLET BY MOUTH EVERY DAY (Patient taking differently: Take 40 mg by mouth daily at 12 noon. ) 90 tablet 2  . GENVOYA 150-150-200-10 MG TABS tablet Take 1 tablet by mouth daily with breakfast. 30  tablet 11  . hydrochlorothiazide (HYDRODIURIL) 25 MG tablet TAKE 1 TABLET BY MOUTH EVERY DAY 90 tablet 3  . lisinopril (ZESTRIL) 20 MG tablet TAKE 1 TABLET (20 MG TOTAL) BY MOUTH DAILY. NEEDS AN APPOINTMENT 90 tablet 3  . aspirin 81 MG EC tablet Take 1 tablet (81 mg total) by mouth daily. 21 tablet 0  . clopidogrel (PLAVIX) 75 MG tablet Take 1 tablet (75 mg total) by mouth daily. 30 tablet 2   No facility-administered medications prior to visit.     Per HPI unless specifically indicated in ROS section below Review of Systems Objective:  BP 120/68 (BP Location: Left Arm, Patient Position: Sitting, Cuff Size: Normal)   Pulse 70   Temp 97.9 F (36.6 C) (Temporal)   Ht 5\' 6"  (1.676 m)   Wt 136 lb 8 oz (61.9 kg)   SpO2 96%   BMI 22.03 kg/m   Wt Readings from Last 3 Encounters:  12/26/19 136 lb 8 oz (61.9 kg)  12/18/19 138 lb (62.6 kg)  09/04/19 141 lb 5 oz (64.1 kg)      Physical Exam Vitals and nursing note reviewed.  Constitutional:      Appearance: Normal appearance. He is not ill-appearing.  Cardiovascular:     Rate and Rhythm: Normal rate and regular rhythm.     Pulses: Normal pulses.     Heart sounds: Normal heart sounds. No murmur heard.  Pulmonary:     Effort: Pulmonary effort is normal. No respiratory distress.     Breath sounds: Normal breath sounds. No wheezing, rhonchi or rales.  Musculoskeletal:     Right lower leg: No edema.     Left lower leg: No edema.  Neurological:     Mental Status: He is alert.  Psychiatric:        Mood and Affect: Mood normal.        Behavior: Behavior normal.       Assessment & Plan:  This visit occurred during the SARS-CoV-2 public health emergency.  Safety protocols were in place, including screening questions prior to the visit, additional usage of staff PPE, and extensive cleaning of exam room while observing appropriate contact time as indicated for disinfecting solutions.   Problem List Items Addressed This Visit     Tobacco use - Primary    Continued smoker for 30+ years, difficult to quantify amount however. Currently smoking about 1 pack per week. Motivated to quit smoking. Discussed smoking cessation options including wellbutrin, chantix, and NRT. Will avoid chantix at this time given stroke history. Will Rx wellbutrin (discussed side effects to watch for) as well as nicorette gum (discussed park and chew method). Reassess at 3 mo AMW.       History of stroke without residual deficits    Continue aspirin and plavix along with statin.       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           Meds ordered this encounter  Medications  . buPROPion (WELLBUTRIN SR) 100 MG 12 hr tablet    Sig: Take 1 tablet (100 mg total) by mouth daily for 7 days, THEN 1 tablet (100 mg total) 2 (two) times daily.    Dispense:  67 tablet    Refill:  1    Smoking cessation   No orders of the defined types were placed in this encounter.   Patient instructions: I'm glad you're ready to quit smoking!  Let's use nicotine gum (nicorette) - use park and chew method. When you get a craving for cigarette, start chewing a piece of gum then park in between your gum and your teeth. Next time you get a craving, chew some more then park.  We can also try mood medicine that helps you quit smoking called wellbutrin. Take 1 tablet daily for 1-2 months then stop. Side effects to watch for - restless energy, headache, nausea.  Set a quit date 1-2 weeks after starting mood medicine.  Return in 3 months for wellness visit.   Follow up plan: Return in about 3 months (around 03/27/2020) for medicare wellness visit.  [name], MD

## 2019-12-26 NOTE — Assessment & Plan Note (Signed)
Never returned call to schedule ABIs- encouraged he call to reschedule this.

## 2019-12-26 NOTE — Patient Instructions (Addendum)
I'm glad you're ready to quit smoking!  Let's use nicotine gum (nicorette) - use park and chew method. When you get a craving for cigarette, start chewing a piece of gum then park in between your gum and your teeth. Next time you get a craving, chew some more then park.  We can also try mood medicine that helps you quit smoking called wellbutrin. Take 1 tablet daily for 1-2 months then stop. Side effects to watch for - restless energy, headache, nausea.  Set a quit date 1-2 weeks after starting mood medicine.  Return in 3 months for wellness visit.   Steps to Quit Smoking Smoking tobacco is the leading cause of preventable death. It can affect almost every organ in the body. Smoking puts you and people around you at risk for many serious, long-lasting (chronic) diseases. Quitting smoking can be hard, but it is one of the best things that you can do for your health. It is never too late to quit. How do I get ready to quit? When you decide to quit smoking, make a plan to help you succeed. Before you quit:  Pick a date to quit. Set a date within the next 2 weeks to give you time to prepare.  Write down the reasons why you are quitting. Keep this list in places where you will see it often.  Tell your family, friends, and co-workers that you are quitting. Their support is important.  Talk with your doctor about the choices that may help you quit.  Find out if your health insurance will pay for these treatments.  Know the people, places, things, and activities that make you want to smoke (triggers). Avoid them. What first steps can I take to quit smoking?  Throw away all cigarettes at home, at work, and in your car.  Throw away the things that you use when you smoke, such as ashtrays and lighters.  Clean your car. Make sure to empty the ashtray.  Clean your home, including curtains and carpets. What can I do to help me quit smoking? Talk with your doctor about taking medicines and seeing a  counselor at the same time. You are more likely to succeed when you do both.  If you are pregnant or breastfeeding, talk with your doctor about counseling or other ways to quit smoking. Do not take medicine to help you quit smoking unless your doctor tells you to do so. To quit smoking: Quit right away  Quit smoking totally, instead of slowly cutting back on how much you smoke over a period of time.  Go to counseling. You are more likely to quit if you go to counseling sessions regularly. Take medicine You may take medicines to help you quit. Some medicines need a prescription, and some you can buy over-the-counter. Some medicines may contain a drug called nicotine to replace the nicotine in cigarettes. Medicines may:  Help you to stop having the desire to smoke (cravings).  Help to stop the problems that come when you stop smoking (withdrawal symptoms). Your doctor may ask you to use:  Nicotine patches, gum, or lozenges.  Nicotine inhalers or sprays.  Non-nicotine medicine that is taken by mouth. Find resources Find resources and other ways to help you quit smoking and remain smoke-free after you quit. These resources are most helpful when you use them often. They include:  Online chats with a Social worker.  Phone quitlines.  Printed Furniture conservator/restorer.  Support groups or group counseling.  Text messaging programs.  Mobile phone apps. Use apps on your mobile phone or tablet that can help you stick to your quit plan. There are many free apps for mobile phones and tablets as well as websites. Examples include Quit Guide from the [employer] and smokefree.gov  What things can I do to make it easier to quit?   Talk to your family and friends. Ask them to support and encourage you.  Call a phone quitline (1-800-QUIT-NOW), reach out to support groups, or work with a Social worker.  Ask people who smoke to not smoke around you.  Avoid places that make you want to smoke, such  as: ? Bars. ? Parties. ? Smoke-break areas at work.  Spend time with people who do not smoke.  Lower the stress in your life. Stress can make you want to smoke. Try these things to help your stress: ? Getting regular exercise. ? Doing deep-breathing exercises. ? Doing yoga. ? Meditating. ? Doing a body scan. To do this, close your eyes, focus on one area of your body at a time from head to toe. Notice which parts of your body are tense. Try to relax the muscles in those areas. How will I feel when I quit smoking? Day 1 to 3 weeks Within the first 24 hours, you may start to have some problems that come from quitting tobacco. These problems are very bad 2-3 days after you quit, but they do not often last for more than 2-3 weeks. You may get these symptoms:  Mood swings.  Feeling restless, nervous, angry, or annoyed.  Trouble concentrating.  Dizziness.  Strong desire for high-sugar foods and nicotine.  Weight gain.  Trouble pooping (constipation).  Feeling like you may vomit (nausea).  Coughing or a sore throat.  Changes in how the medicines that you take for other issues work in your body.  Depression.  Trouble sleeping (insomnia). Week 3 and afterward After the first 2-3 weeks of quitting, you may start to notice more positive results, such as:  Better sense of smell and taste.  Less coughing and sore throat.  Slower heart rate.  Lower blood pressure.  Clearer skin.  Better breathing.  Fewer sick days. Quitting smoking can be hard. Do not give up if you fail the first time. Some people need to try a few times before they succeed. Do your best to stick to your quit plan, and talk with your doctor if you have any questions or concerns. Summary  Smoking tobacco is the leading cause of preventable death. Quitting smoking can be hard, but it is one of the best things that you can do for your health.  When you decide to quit smoking, make a plan to help you  succeed.  Quit smoking right away, not slowly over a period of time.  When you start quitting, seek help from your doctor, family, or friends. This information is not intended to replace advice given to you by your health care provider. Make sure you discuss any questions you have with your health care provider. Document Revised: 01/13/2019 Document Reviewed: 07/09/2018 Elsevier Patient Education  [place].

## 2019-12-26 NOTE — Assessment & Plan Note (Signed)
Continued smoker for 30+ years, difficult to quantify amount however. Currently smoking about 1 pack per week. Motivated to quit smoking. Discussed smoking cessation options including wellbutrin, chantix, and NRT. Will avoid chantix at this time given stroke history. Will Rx wellbutrin (discussed side effects to watch for) as well as nicorette gum (discussed park and chew method). Reassess at 3 mo AMW.

## 2019-12-26 NOTE — Assessment & Plan Note (Addendum)
Continue aspirin and plavix along with statin.

## 2019-12-28 DIAGNOSIS — R351 Nocturia: Secondary | ICD-10-CM | POA: Diagnosis not present

## 2019-12-28 DIAGNOSIS — Z8546 Personal history of malignant neoplasm of prostate: Secondary | ICD-10-CM | POA: Diagnosis not present

## 2019-12-28 DIAGNOSIS — N3281 Overactive bladder: Secondary | ICD-10-CM | POA: Diagnosis not present

## 2020-01-05 MED FILL — GENVOYA TABLET: 150-150-200 | 30 days supply | Qty: 30 | Fill #3

## 2020-01-29 DIAGNOSIS — R972 Elevated prostate specific antigen [PSA]: Secondary | ICD-10-CM | POA: Diagnosis not present

## 2020-01-29 DIAGNOSIS — C61 Malignant neoplasm of prostate: Secondary | ICD-10-CM | POA: Diagnosis not present

## 2020-01-29 DIAGNOSIS — N3281 Overactive bladder: Secondary | ICD-10-CM | POA: Diagnosis not present

## 2020-01-30 ENCOUNTER — Other Ambulatory Visit (HOSPITAL_COMMUNITY): Payer: Self-pay | Admitting: Urology

## 2020-01-30 DIAGNOSIS — C61 Malignant neoplasm of prostate: Secondary | ICD-10-CM

## 2020-02-01 MED FILL — GENVOYA TABLET: 150-150-200 | 30 days supply | Qty: 30 | Fill #4

## 2020-02-06 ENCOUNTER — Encounter (HOSPITAL_COMMUNITY)
Admission: RE | Admit: 2020-02-06 | Discharge: 2020-02-06 | Disposition: A | Discharge status: Home / Self Care | Payer: Medicare Other | Source: Ambulatory Visit | Attending: Urology | Admitting: Urology

## 2020-02-06 ENCOUNTER — Other Ambulatory Visit: Payer: Self-pay

## 2020-02-06 DIAGNOSIS — C61 Malignant neoplasm of prostate: Secondary | ICD-10-CM | POA: Insufficient documentation

## 2020-02-06 MED ORDER — TECHNETIUM TC 99M MEDRONATE IV KIT
19.1000 | PACK | Freq: Once | INTRAVENOUS | Status: AC | PRN
  Administered 2020-02-06: 19.1 via INTRAVENOUS

## 2020-02-28 ENCOUNTER — Telehealth: Payer: Self-pay

## 2020-02-28 MED FILL — GENVOYA TABLET: 150-150-200 | 30 days supply | Qty: 30 | Fill #5

## 2020-02-28 NOTE — Telephone Encounter (Signed)
[facility] Patient Advocate Encounter   I was successful in securing patient a  grant from [employer] ([employer]) to provide copayment coverage for Genvoya.  This will make the out of pocket cost $0.00.     I have spoken with the patient.    The billing information is as follows and has been shared with [facility].   RxBin: [id number] PCN:   [id number]  Member ID: [id number] Group ID: [id number] Dates of Eligibility: 02/28/20 through 02/27/21  Patient knows to call the office with questions or concerns.  Ileene Patrick, [facility] Patient [facility] for Infectious Disease Phone: [phone] Fax:  [phone]

## 2020-03-15 ENCOUNTER — Other Ambulatory Visit: Payer: Self-pay | Admitting: Family Medicine

## 2020-03-15 NOTE — Telephone Encounter (Signed)
Bupropion  Last filled:  02/02/20, #67 Last OV:  12/26/19, tobacco use Next OV:  03/25/20, AWV

## 2020-03-15 NOTE — Telephone Encounter (Signed)
ERx 

## 2020-03-18 ENCOUNTER — Other Ambulatory Visit (INDEPENDENT_AMBULATORY_CARE_PROVIDER_SITE_OTHER): Payer: Medicare Other

## 2020-03-18 ENCOUNTER — Other Ambulatory Visit: Payer: Self-pay

## 2020-03-18 ENCOUNTER — Other Ambulatory Visit: Payer: Self-pay | Admitting: Family Medicine

## 2020-03-18 DIAGNOSIS — R739 Hyperglycemia, unspecified: Secondary | ICD-10-CM

## 2020-03-18 DIAGNOSIS — Z8673 Personal history of transient ischemic attack (TIA), and cerebral infarction without residual deficits: Secondary | ICD-10-CM | POA: Diagnosis not present

## 2020-03-18 DIAGNOSIS — E786 Lipoprotein deficiency: Secondary | ICD-10-CM | POA: Diagnosis not present

## 2020-03-18 DIAGNOSIS — C61 Malignant neoplasm of prostate: Secondary | ICD-10-CM

## 2020-03-19 LAB — COMPREHENSIVE METABOLIC PANEL
ALT: 13 U/L (ref 0–53)
AST: 14 U/L (ref 0–37)
Albumin: 3.8 g/dL (ref 3.5–5.2)
Alkaline Phosphatase: 75 U/L (ref 39–117)
BUN: 22 mg/dL (ref 6–23)
CO2: 28 mEq/L (ref 19–32)
Calcium: 9 mg/dL (ref 8.4–10.5)
Chloride: 100 mEq/L (ref 96–112)
Creatinine, Ser: 1.54 mg/dL — ABNORMAL HIGH (ref 0.40–1.50)
GFR: 42.51 mL/min — ABNORMAL LOW (ref >60.00)
Glucose, Bld: 121 mg/dL — ABNORMAL HIGH (ref 70–99)
Potassium: 4 mEq/L (ref 3.5–5.1)
Sodium: 137 mEq/L (ref 135–145)
Total Bilirubin: 0.3 mg/dL (ref 0.2–1.2)
Total Protein: 7.6 g/dL (ref 6.0–8.3)

## 2020-03-19 LAB — CBC WITH DIFFERENTIAL/PLATELET
Basophils Absolute: 0.1 10*3/uL (ref 0.0–0.1)
Basophils Relative: 1.1 % (ref 0.0–3.0)
Eosinophils Absolute: 0.3 10*3/uL (ref 0.0–0.7)
Eosinophils Relative: 3.5 % (ref 0.0–5.0)
HCT: 38 % — ABNORMAL LOW (ref 39.0–52.0)
Hemoglobin: 12.4 g/dL — ABNORMAL LOW (ref 13.0–17.0)
Lymphocytes Relative: 22.6 % (ref 12.0–46.0)
Lymphs Abs: 2.1 10*3/uL (ref 0.7–4.0)
MCHC: 32.7 g/dL (ref 30.0–36.0)
MCV: 86.3 fl (ref 78.0–100.0)
Monocytes Absolute: 0.6 10*3/uL (ref 0.1–1.0)
Monocytes Relative: 6.2 % (ref 3.0–12.0)
Neutro Abs: 6.3 10*3/uL (ref 1.4–7.7)
Neutrophils Relative %: 66.6 % (ref 43.0–77.0)
Platelets: 304 10*3/uL (ref 150.0–400.0)
RBC: 4.41 Mil/uL (ref 4.22–5.81)
RDW: 14.3 % (ref 11.5–15.5)
WBC: 9.4 10*3/uL (ref 4.0–10.5)

## 2020-03-19 LAB — LIPID PANEL
Cholesterol: 117 mg/dL (ref 0–200)
HDL: 34.2 mg/dL — ABNORMAL LOW (ref >39.00)
LDL Cholesterol: 65 mg/dL (ref 0–99)
NonHDL: 82.63
Total CHOL/HDL Ratio: 3
Triglycerides: 87 mg/dL (ref 0.0–149.0)
VLDL: 17.4 mg/dL (ref 0.0–40.0)

## 2020-03-19 LAB — PSA: PSA: 9.34 ng/mL — ABNORMAL HIGH (ref 0.10–4.00)

## 2020-03-19 LAB — HEMOGLOBIN A1C: Hgb A1c MFr Bld: 6.1 % (ref 4.6–6.5)

## 2020-03-21 ENCOUNTER — Other Ambulatory Visit: Payer: Self-pay | Admitting: Internal Medicine

## 2020-03-21 DIAGNOSIS — B20 Human immunodeficiency virus [HIV] disease: Secondary | ICD-10-CM

## 2020-03-25 ENCOUNTER — Other Ambulatory Visit: Payer: Self-pay

## 2020-03-25 ENCOUNTER — Encounter: Payer: Self-pay | Admitting: Family Medicine

## 2020-03-25 ENCOUNTER — Ambulatory Visit (INDEPENDENT_AMBULATORY_CARE_PROVIDER_SITE_OTHER): Payer: Medicare Other | Admitting: Family Medicine

## 2020-03-25 ENCOUNTER — Telehealth: Payer: Self-pay | Admitting: Family Medicine

## 2020-03-25 VITALS — BP 120/64 | HR 77 | Temp 97.6°F | Ht 65.5 in | Wt 134.1 lb

## 2020-03-25 DIAGNOSIS — R7303 Prediabetes: Secondary | ICD-10-CM | POA: Diagnosis not present

## 2020-03-25 DIAGNOSIS — I10 Essential (primary) hypertension: Secondary | ICD-10-CM

## 2020-03-25 DIAGNOSIS — Z720 Tobacco use: Secondary | ICD-10-CM

## 2020-03-25 DIAGNOSIS — Z8673 Personal history of transient ischemic attack (TIA), and cerebral infarction without residual deficits: Secondary | ICD-10-CM

## 2020-03-25 DIAGNOSIS — B20 Human immunodeficiency virus [HIV] disease: Secondary | ICD-10-CM

## 2020-03-25 DIAGNOSIS — Z23 Encounter for immunization: Secondary | ICD-10-CM | POA: Diagnosis not present

## 2020-03-25 DIAGNOSIS — J439 Emphysema, unspecified: Secondary | ICD-10-CM | POA: Diagnosis not present

## 2020-03-25 DIAGNOSIS — Z7189 Other specified counseling: Secondary | ICD-10-CM

## 2020-03-25 DIAGNOSIS — N289 Disorder of kidney and ureter, unspecified: Secondary | ICD-10-CM

## 2020-03-25 DIAGNOSIS — I639 Cerebral infarction, unspecified: Secondary | ICD-10-CM | POA: Diagnosis not present

## 2020-03-25 DIAGNOSIS — Z0000 Encounter for general adult medical examination without abnormal findings: Secondary | ICD-10-CM | POA: Diagnosis not present

## 2020-03-25 DIAGNOSIS — N183 Chronic kidney disease, stage 3 unspecified: Secondary | ICD-10-CM | POA: Insufficient documentation

## 2020-03-25 DIAGNOSIS — C61 Malignant neoplasm of prostate: Secondary | ICD-10-CM | POA: Diagnosis not present

## 2020-03-25 MED ORDER — AMLODIPINE BESYLATE 5 MG PO TABS
5.0000 mg | ORAL_TABLET | Freq: Every day | ORAL | 3 refills | Status: DC
Start: 2020-03-25 — End: 2021-02-17

## 2020-03-25 MED ORDER — ATORVASTATIN CALCIUM 40 MG PO TABS
40.0000 mg | ORAL_TABLET | Freq: Every day | ORAL | 3 refills | Status: DC
Start: 2020-03-25 — End: 2021-04-30

## 2020-03-25 MED ORDER — LISINOPRIL-HYDROCHLOROTHIAZIDE 20-12.5 MG PO TABS: 1.0000 | ORAL_TABLET | Freq: Every day | ORAL | 3 refills | Status: DC

## 2020-03-25 MED FILL — GENVOYA TABLET: 150-150-200 | 30 days supply | Qty: 30 | Fill #0

## 2020-03-25 NOTE — Assessment & Plan Note (Signed)
Newly noted, reviewed with patient - GFR dropped from 59 (12/2019) to 42.  Will decrease HCTZ dose to 12.5mg  daily. Continue other meds, but will combine ACEI with HCTZ for combo pill to simplify regimen. RTC 3 mo f/u HTN and renal insuff with further labs at that time.  Caution with Genvoya if progressive kidney insufficiency.

## 2020-03-25 NOTE — Patient Instructions (Addendum)
Flu shot today  Congratulations on decreasing smoking so much! Keep it up! Kidney function was a bit worse this year. Increase water to keep kidneys hydrated.  Continue atorvastatin for cholesterol and amlodipine for blood pressure.  New change: combining lisinopril and hydrochlorothiazide into 1 combo pill - so you will take one of these daily instead of previous two.  Return as needed or in 3 months for blood pressure follow up visit.   Health Maintenance After Age 74 After age 13, you are at a higher risk for certain long-term diseases and infections as well as injuries from falls. Falls are a major cause of broken bones and head injuries in people who are older than age 56. Getting regular preventive care can help to keep you healthy and well. Preventive care includes getting regular testing and making lifestyle changes as recommended by your health care provider. Talk with your health care provider about:  Which screenings and tests you should have. A screening is a test that checks for a disease when you have no symptoms.  A diet and exercise plan that is right for you. What should I know about screenings and tests to prevent falls? Screening and testing are the best ways to find a health problem early. Early diagnosis and treatment give you the best chance of managing medical conditions that are common after age 63. Certain conditions and lifestyle choices may make you more likely to have a fall. Your health care provider may recommend:  Regular vision checks. Poor vision and conditions such as cataracts can make you more likely to have a fall. If you wear glasses, make sure to get your prescription updated if your vision changes.  Medicine review. Work with your health care provider to regularly review all of the medicines you are taking, including over-the-counter medicines. Ask your health care provider about any side effects that may make you more likely to have a fall. Tell your health  care provider if any medicines that you take make you feel dizzy or sleepy.  Osteoporosis screening. Osteoporosis is a condition that causes the bones to get weaker. This can make the bones weak and cause them to break more easily.  Blood pressure screening. Blood pressure changes and medicines to control blood pressure can make you feel dizzy.  Strength and balance checks. Your health care provider may recommend certain tests to check your strength and balance while standing, walking, or changing positions.  Foot health exam. Foot pain and numbness, as well as not wearing proper footwear, can make you more likely to have a fall.  Depression screening. You may be more likely to have a fall if you have a fear of falling, feel emotionally low, or feel unable to do activities that you used to do.  Alcohol use screening. Using too much alcohol can affect your balance and may make you more likely to have a fall. What actions can I take to lower my risk of falls? General instructions  Talk with your health care provider about your risks for falling. Tell your health care provider if: ? You fall. Be sure to tell your health care provider about all falls, even ones that seem minor. ? You feel dizzy, sleepy, or off-balance.  Take over-the-counter and prescription medicines only as told by your health care provider. These include any supplements.  Eat a healthy diet and maintain a healthy weight. A healthy diet includes low-fat dairy products, low-fat (lean) meats, and fiber from whole grains, beans, and  lots of fruits and vegetables. Home safety  Remove any tripping hazards, such as rugs, cords, and clutter.  Install safety equipment such as grab bars in bathrooms and safety rails on stairs.  Keep rooms and walkways well-lit. Activity   Follow a regular exercise program to stay fit. This will help you maintain your balance. Ask your health care provider what types of exercise are appropriate  for you.  If you need a cane or walker, use it as recommended by your health care provider.  Wear supportive shoes that have nonskid soles. Lifestyle  Do not drink alcohol if your health care provider tells you not to drink.  If you drink alcohol, limit how much you have: ? 0-1 drink a day for women. ? 0-2 drinks a day for men.  Be aware of how much alcohol is in your drink. In the [place] one drink equals one typical bottle of beer (12 oz), one-half glass of wine (5 oz), or one shot of hard liquor (1 oz).  Do not use any products that contain nicotine or tobacco, such as cigarettes and e-cigarettes. If you need help quitting, ask your health care provider. Summary  Having a healthy lifestyle and getting preventive care can help to protect your health and wellness after age 55.  Screening and testing are the best way to find a health problem early and help you avoid having a fall. Early diagnosis and treatment give you the best chance for managing medical conditions that are more common for people who are older than age 53.  Falls are a major cause of broken bones and head injuries in people who are older than age 27. Take precautions to prevent a fall at home.  Work with your health care provider to learn what changes you can make to improve your health and wellness and to prevent falls. This information is not intended to replace advice given to you by your health care provider. Make sure you discuss any questions you have with your health care provider. Document Revised: 08/11/2018 Document Reviewed: 03/03/2017 Elsevier Patient Education  2020 [employer].

## 2020-03-25 NOTE — Progress Notes (Signed)
Patient ID: Charles Daniel, male    DOB: [birth year], 79 y.o.   MRN: [id number]  This visit was conducted in person.  BP 120/64 (BP Location: Left Arm, Patient Position: Sitting, Cuff Size: Normal)   Pulse 77   Temp 97.6 F (36.4 C) (Temporal)   Ht 5' 5.5" (1.664 m)   Wt 134 lb 2 oz (60.8 kg)   SpO2 95%   BMI 21.98 kg/m   BP Readings from Last 3 Encounters:  03/25/20 120/64  12/26/19 120/68  12/18/19 129/72    CC: AMW  Subjective:   HPI: Charles Daniel is a 79 y.o. male presenting on 03/25/2020 for Medicare Wellness   Did not see health advisor this year.    Hearing Screening   125Hz  250Hz  500Hz  1000Hz  2000Hz  3000Hz  4000Hz  6000Hz  8000Hz   Right ear:   40 25 0  20    Left ear:   25 40 0  0    Vision Screening Comments: Last eye exam within past yr.  Due in 2022.    Office Visit from 03/25/2020 in [place] at [facility]  PHQ-2 Total Score 0      Fall Risk  03/25/2020 12/18/2019 03/13/2019 12/01/2018 09/28/2017  Falls in the past year? 0 0 0 0 No  Number falls in past yr: - - - 0 -  Injury with Fall? - - - 0 -    HIV followed by Dr Megan Salon. Family not aware of dx.  H/o COPD - feels breathing is doing well. Smoker - did not tolerate wellbutrin. He has self tapered - down to < 1 pack in the past 2 months! Did not use NRT.   Preventative: COLONOSCOPY 10/2016 7 TAs, diverticulosis, no rpt recommended ([name])  H/o prostate cancer - sees [facility] yearly,now Dr Milford Cage, s/p radioactive seed implant 01/2013. Nocturia x1-2 - improvement. PSA levels trending up - had normal bone scan per pt - planning to return 05/2020 with rpt PSA, considering ADT.  Lung cancer screening - not candidate - smoking 1/3 ppd for 30 yrs Flu shot yearly Pneumovax - 09/2010 and 2007.Prevnar9/2017 Td- 08/2012 COVID vaccine - Moderna 06/2019, 07/2019, 02/2020 Shingles shot - declines for financial reasons  Advanced directive discussion - doesn't have set up, agrees to packet. Would want wife  then grand daughter to [facility]- rocky relationship with wife. Doesn't want prolonged life support if terminal condition.Ok for trial if he could improve. Seat belt use discussed  Sunscreen use discussed.No changing moles on skin.  Ex smoker - quit 2010, again 2021 Alcohol - none Dentist - does not see - has dentures.  Eye exam - to see 20222 Bowel - no constipation Bladder - no incontinence   Caffeine: 3 sodas/day Lives with wife. 2 grown sons. Occupation: retired from city of [employer], now works as custodian Radio broadcast assistant in [place]  Edu: 10th grade  Activity: walks at work  Diet: some water, fruits and vegetables regular     Relevant past medical, surgical, family and social history reviewed and updated as indicated. Interim medical history since our last visit reviewed. Allergies and medications reviewed and updated. Outpatient Medications Prior to Visit  Medication Sig Dispense Refill  . GENVOYA 150-150-200-10 MG TABS tablet TAKE 1 TABLET BY MOUTH DAILY WITH BREAKFAST. 30 tablet 5  . amLODipine (NORVASC) 5 MG tablet Take 1 tablet (5 mg total) by mouth daily.    Marland Kitchen atorvastatin (LIPITOR) 40 MG tablet TAKE 1 TABLET BY MOUTH EVERY DAY (Patient taking differently: Take 40  mg by mouth daily at 12 noon. ) 90 tablet 2  . hydrochlorothiazide (HYDRODIURIL) 25 MG tablet TAKE 1 TABLET BY MOUTH EVERY DAY 90 tablet 3  . lisinopril (ZESTRIL) 20 MG tablet TAKE 1 TABLET (20 MG TOTAL) BY MOUTH DAILY. NEEDS AN APPOINTMENT 90 tablet 3  . buPROPion (WELLBUTRIN SR) 100 MG 12 hr tablet Take 1 tablet (100 mg total) by mouth 2 (two) times daily. 60 tablet 1   No facility-administered medications prior to visit.     Per HPI unless specifically indicated in ROS section below Review of Systems Objective:  BP 120/64 (BP Location: Left Arm, Patient Position: Sitting, Cuff Size: Normal)   Pulse 77   Temp 97.6 F (36.4 C) (Temporal)   Ht 5' 5.5" (1.664 m)   Wt 134 lb 2 oz (60.8 kg)   SpO2 95%    BMI 21.98 kg/m   Wt Readings from Last 3 Encounters:  03/25/20 134 lb 2 oz (60.8 kg)  12/26/19 136 lb 8 oz (61.9 kg)  12/18/19 138 lb (62.6 kg)      Physical Exam Vitals and nursing note reviewed.  Constitutional:      General: He is not in acute distress.    Appearance: Normal appearance. He is well-developed. He is not ill-appearing.  HENT:     Head: Normocephalic and atraumatic.     Right Ear: Hearing, tympanic membrane, ear canal and external ear normal.     Left Ear: Hearing, tympanic membrane, ear canal and external ear normal.     Mouth/Throat:     Pharynx: Uvula midline.  Eyes:     General: No scleral icterus.    Extraocular Movements: Extraocular movements intact.     Conjunctiva/sclera: Conjunctivae normal.     Pupils: Pupils are equal, round, and reactive to light.  Neck:     Thyroid: No thyroid mass or thyromegaly.     Vascular: No carotid bruit.  Cardiovascular:     Rate and Rhythm: Normal rate and regular rhythm.     Pulses: Normal pulses.          Radial pulses are 2+ on the right side and 2+ on the left side.     Heart sounds: Normal heart sounds. No murmur heard.   Pulmonary:     Effort: Pulmonary effort is normal. No respiratory distress.     Breath sounds: No wheezing, rhonchi or rales.     Comments: Coarse lung sounds throughout Abdominal:     General: Abdomen is flat. Bowel sounds are normal. There is no distension.     Palpations: Abdomen is soft. There is no mass.     Tenderness: There is no abdominal tenderness. There is no guarding or rebound.     Hernia: No hernia is present.  Musculoskeletal:        General: Normal range of motion.     Cervical back: Normal range of motion and neck supple.     Right lower leg: No edema.     Left lower leg: No edema.  Lymphadenopathy:     Cervical: No cervical adenopathy.  Skin:    General: Skin is warm and dry.     Findings: No rash.  Neurological:     General: No focal deficit present.     Mental  Status: He is alert and oriented to person, place, and time.     Comments:  CN grossly intact, station and gait intact recall 3/3 Calculation 5/5 DLROW  Psychiatric:  Mood and Affect: Mood normal.        Behavior: Behavior normal.        Thought Content: Thought content normal.        Judgment: Judgment normal.       Results for orders placed or performed in visit on 03/18/20  Hemoglobin A1c  Result Value Ref Range   Hgb A1c MFr Bld 6.1 4.6 - 6.5 %  CBC with Differential/Platelet  Result Value Ref Range   WBC 9.4 4.0 - 10.5 K/uL   RBC 4.41 4.22 - 5.81 Mil/uL   Hemoglobin 12.4 (L) 13.0 - 17.0 g/dL   HCT 38.0 (L) 39 - 52 %   MCV 86.3 78.0 - 100.0 fl   MCHC 32.7 30.0 - 36.0 g/dL   RDW 14.3 11.5 - 15.5 %   Platelets 304.0 150 - 400 K/uL   Neutrophils Relative % 66.6 43 - 77 %   Lymphocytes Relative 22.6 12 - 46 %   Monocytes Relative 6.2 3 - 12 %   Eosinophils Relative 3.5 0 - 5 %   Basophils Relative 1.1 0 - 3 %   Neutro Abs 6.3 1.4 - 7.7 K/uL   Lymphs Abs 2.1 0.7 - 4.0 K/uL   Monocytes Absolute 0.6 0.1 - 1.0 K/uL   Eosinophils Absolute 0.3 0.0 - 0.7 K/uL   Basophils Absolute 0.1 0.0 - 0.1 K/uL  Comprehensive metabolic panel  Result Value Ref Range   Sodium 137 135 - 145 mEq/L   Potassium 4.0 3.5 - 5.1 mEq/L   Chloride 100 96 - 112 mEq/L   CO2 28 19 - 32 mEq/L   Glucose, Bld 121 (H) 70 - 99 mg/dL   BUN 22 6 - 23 mg/dL   Creatinine, Ser 1.54 (H) 0.40 - 1.50 mg/dL   Total Bilirubin 0.3 0.2 - 1.2 mg/dL   Alkaline Phosphatase 75 39 - 117 U/L   AST 14 0 - 37 U/L   ALT 13 0 - 53 U/L   Total Protein 7.6 6.0 - 8.3 g/dL   Albumin 3.8 3.5 - 5.2 g/dL   GFR 42.51 (L) >60.00 mL/min   Calcium 9.0 8.4 - 10.5 mg/dL  Lipid panel  Result Value Ref Range   Cholesterol 117 0 - 200 mg/dL   Triglycerides 87.0 0 - 149 mg/dL   HDL 34.20 (L) >39.00 mg/dL   VLDL 17.4 0.0 - 40.0 mg/dL   LDL Cholesterol 65 0 - 99 mg/dL   Total CHOL/HDL Ratio 3    NonHDL 82.63   PSA  Result  Value Ref Range   PSA 9.34 (H) 0.10 - 4.00 ng/mL   Assessment & Plan:  This visit occurred during the SARS-CoV-2 public health emergency.  Safety protocols were in place, including screening questions prior to the visit, additional usage of staff PPE, and extensive cleaning of exam room while observing appropriate contact time as indicated for disinfecting solutions.   Problem List Items Addressed This Visit    Tobacco use    Congratulated on smoking cessation - quit cold [place].  Did not tolerate wellbutrin, did not use NRT.       Renal insufficiency    Newly noted, reviewed with patient - GFR dropped from 59 (12/2019) to 42.  Will decrease HCTZ dose to 12.5mg  daily. Continue other meds, but will combine ACEI with HCTZ for combo pill to simplify regimen. RTC 3 mo f/u HTN and renal insuff with further labs at that time.  Caution with Genvoya if progressive kidney  insufficiency.       Prostate cancer [facility])    Concern for resurgence of disease with increasing PSA, fortunately recent bone scan was normal. Planning to return to urology 05/2020 and likely at that time start ADT. Advised he call and schedule an appointment.       Prediabetes    Encouraged limiting added sugar in diet.       Medicare annual wellness visit, subsequent - Primary    I have personally reviewed the Medicare Annual Wellness questionnaire and have noted 1. The patient's medical and social history 2. Their use of alcohol, tobacco or illicit drugs 3. Their current medications and supplements 4. The patient's functional ability including ADL's, fall risks, home safety risks and hearing or visual impairment. Cognitive function has been assessed and addressed as indicated.  5. Diet and physical activity 6. Evidence for depression or mood disorders The patients weight, height, BMI have been recorded in the chart. I have made referrals, counseling and provided education to the patient based on review of the above and I  have provided the pt with a written personalized care plan for preventive services. Provider list updated.. See scanned questionairre as needed for further documentation. Reviewed preventative protocols and updated unless pt declined.       Human immunodeficiency virus (HIV) disease ([facility])    Appreciate ID care. Well controlled on Genvoya. Sees Dr Megan Salon yearly.       History of stroke without residual deficits    He should be on aspirin 81mg  and plavix after recent strokes but is currently off both. Will need to verify what he's taking.       Essential hypertension    Chronic, stable. Given renal insufficiency noted, will drop hctz to 12.5mg  dose and combine with lisinopril to simplify regimen. Extensively reviewed plan with patient, provided in writing. RTC 3 mo close f/u.       Relevant Medications   lisinopril-hydrochlorothiazide (ZESTORETIC) 20-12.5 MG tablet   amLODipine (NORVASC) 5 MG tablet   atorvastatin (LIPITOR) 40 MG tablet   COPD with emphysema (HCC)    Chronic, stable period off respiratory medications.  He endorses quitting smoking recently - congratulated!      Advanced care planning/counseling discussion    Packet previously provided. Encouraged he complete.        Other Visit Diagnoses    Need for influenza vaccination       Relevant Orders   Flu Vaccine QUAD High Dose(Fluad) (Completed)       Meds ordered this encounter  Medications  . lisinopril-hydrochlorothiazide (ZESTORETIC) 20-12.5 MG tablet    Sig: Take 1 tablet by mouth daily.    Dispense:  90 tablet    Refill:  3    To replace individual lisinopril and hctz  . amLODipine (NORVASC) 5 MG tablet    Sig: Take 1 tablet (5 mg total) by mouth daily.    Dispense:  90 tablet    Refill:  3  . atorvastatin (LIPITOR) 40 MG tablet    Sig: Take 1 tablet (40 mg total) by mouth daily.    Dispense:  90 tablet    Refill:  3   Orders Placed This Encounter  Procedures  . Flu Vaccine QUAD High  Dose(Fluad)    Patient instructions: Flu shot today  Congratulations on decreasing smoking so much! Keep it up! Kidney function was a bit worse this year. Increase water to keep kidneys hydrated.  Continue atorvastatin for cholesterol and amlodipine for blood pressure.  New change: combining lisinopril and hydrochlorothiazide into 1 combo pill - so you will take one of these daily instead of previous two.  Return as needed or in 3 months for blood pressure follow up visit.   Follow up plan: Return in about 3 months (around 06/25/2020) for follow up visit.  [name], MD

## 2020-03-25 NOTE — Assessment & Plan Note (Signed)
Chronic, stable. Given renal insufficiency noted, will drop hctz to 12.5mg  dose and combine with lisinopril to simplify regimen. Extensively reviewed plan with patient, provided in writing. RTC 3 mo close f/u.

## 2020-03-25 NOTE — Assessment & Plan Note (Signed)
He should be on aspirin 81mg  and plavix after recent strokes but is currently off both. Will need to verify what he's taking.

## 2020-03-25 NOTE — Assessment & Plan Note (Addendum)
Encouraged limiting added sugar in diet.  

## 2020-03-25 NOTE — Assessment & Plan Note (Signed)
Packet previously provided. Encouraged he complete.

## 2020-03-25 NOTE — Telephone Encounter (Signed)
Please call - ok to call tomorrow.  I noted he's no longer on aspirin 81mg  or plavix 75mg  daily but per last [facility] neurology note (06/2019) he should be on both with his history of recurrent strokes. Is he taking these?

## 2020-03-25 NOTE — Assessment & Plan Note (Signed)

## 2020-03-25 NOTE — Assessment & Plan Note (Addendum)
Appreciate ID care. Well controlled on Genvoya. Sees Dr Megan Salon yearly.

## 2020-03-25 NOTE — Assessment & Plan Note (Signed)
Congratulated on smoking cessation - quit cold [place].  Did not tolerate wellbutrin, did not use NRT.

## 2020-03-25 NOTE — Assessment & Plan Note (Signed)
Chronic, stable period off respiratory medications.  He endorses quitting smoking recently - congratulated!

## 2020-03-25 NOTE — Assessment & Plan Note (Signed)
Concern for resurgence of disease with increasing PSA, fortunately recent bone scan was normal. Planning to return to urology 05/2020 and likely at that time start ADT. Advised he call and schedule an appointment.

## 2020-03-26 ENCOUNTER — Telehealth: Payer: Self-pay | Admitting: Family Medicine

## 2020-03-26 MED ORDER — CLOPIDOGREL BISULFATE 75 MG PO TABS: 75.0000 mg | ORAL_TABLET | Freq: Every day | ORAL | 3 refills | Status: DC

## 2020-03-26 MED ORDER — ASPIRIN EC 81 MG PO TBEC: 81.0000 mg | DELAYED_RELEASE_TABLET | Freq: Every day | ORAL | 3 refills | Status: DC

## 2020-03-26 NOTE — Telephone Encounter (Signed)
Spoke with pt relaying Dr. Synthia Innocent message.  Pt confirms he is not taking either med.  He cannot remember why he stopped.  But agrees to resume and requests refills be sent to [facility].

## 2020-03-26 NOTE — Telephone Encounter (Signed)
Plavix, aspirin sent to pharmacy. Aspirin may be over the counter.

## 2020-03-26 NOTE — Addendum Note (Signed)
Addended by: [name] on: 03/26/2020 02:09 PM   Modules accepted: Orders

## 2020-03-26 NOTE — Telephone Encounter (Signed)
Pt returning call.  I relayed Dr. Synthia Innocent message.  Pt verbalizes understanding and expresses his thanks.

## 2020-03-26 NOTE — Telephone Encounter (Signed)
See TE, 03/25/20.

## 2020-03-26 NOTE — Telephone Encounter (Signed)
Attempted to contact pt.  No answer.  No vm.  Need to relay Dr. G's message.  

## 2020-03-26 NOTE — Telephone Encounter (Signed)
F/u  Returning call back to nurse.  

## 2020-04-08 DIAGNOSIS — Z23 Encounter for immunization: Secondary | ICD-10-CM | POA: Diagnosis not present

## 2020-04-22 ENCOUNTER — Other Ambulatory Visit: Payer: Self-pay | Admitting: Family Medicine

## 2020-04-22 MED FILL — GENVOYA TABLET: 150-150-200 | 30 days supply | Qty: 30 | Fill #1

## 2020-05-02 ENCOUNTER — Emergency Department
Admission: EM | Admit: 2020-05-02 | Discharge: 2020-05-02 | Disposition: A | Discharge status: Home / Self Care | Payer: Medicare Other | Attending: Emergency Medicine | Admitting: Emergency Medicine

## 2020-05-02 ENCOUNTER — Encounter: Payer: Self-pay | Admitting: Emergency Medicine

## 2020-05-02 ENCOUNTER — Emergency Department: Payer: Medicare Other

## 2020-05-02 ENCOUNTER — Other Ambulatory Visit: Payer: Self-pay

## 2020-05-02 DIAGNOSIS — I4430 Unspecified atrioventricular block: Secondary | ICD-10-CM | POA: Diagnosis not present

## 2020-05-02 DIAGNOSIS — Z79899 Other long term (current) drug therapy: Secondary | ICD-10-CM | POA: Insufficient documentation

## 2020-05-02 DIAGNOSIS — S82831A Other fracture of upper and lower end of right fibula, initial encounter for closed fracture: Secondary | ICD-10-CM | POA: Insufficient documentation

## 2020-05-02 DIAGNOSIS — J449 Chronic obstructive pulmonary disease, unspecified: Secondary | ICD-10-CM | POA: Insufficient documentation

## 2020-05-02 DIAGNOSIS — Z7982 Long term (current) use of aspirin: Secondary | ICD-10-CM | POA: Insufficient documentation

## 2020-05-02 DIAGNOSIS — M25571 Pain in right ankle and joints of right foot: Secondary | ICD-10-CM | POA: Diagnosis not present

## 2020-05-02 DIAGNOSIS — M11261 Other chondrocalcinosis, right knee: Secondary | ICD-10-CM | POA: Diagnosis not present

## 2020-05-02 DIAGNOSIS — S82891A Other fracture of right lower leg, initial encounter for closed fracture: Secondary | ICD-10-CM | POA: Diagnosis not present

## 2020-05-02 DIAGNOSIS — Z85038 Personal history of other malignant neoplasm of large intestine: Secondary | ICD-10-CM | POA: Insufficient documentation

## 2020-05-02 DIAGNOSIS — Z8546 Personal history of malignant neoplasm of prostate: Secondary | ICD-10-CM | POA: Insufficient documentation

## 2020-05-02 DIAGNOSIS — M25561 Pain in right knee: Secondary | ICD-10-CM | POA: Diagnosis not present

## 2020-05-02 DIAGNOSIS — B20 Human immunodeficiency virus [HIV] disease: Secondary | ICD-10-CM | POA: Insufficient documentation

## 2020-05-02 DIAGNOSIS — I10 Essential (primary) hypertension: Secondary | ICD-10-CM | POA: Diagnosis not present

## 2020-05-02 DIAGNOSIS — R55 Syncope and collapse: Secondary | ICD-10-CM | POA: Diagnosis not present

## 2020-05-02 DIAGNOSIS — W01198A Fall on same level from slipping, tripping and stumbling with subsequent striking against other object, initial encounter: Secondary | ICD-10-CM | POA: Diagnosis not present

## 2020-05-02 DIAGNOSIS — F17210 Nicotine dependence, cigarettes, uncomplicated: Secondary | ICD-10-CM | POA: Diagnosis not present

## 2020-05-02 DIAGNOSIS — S99911A Unspecified injury of right ankle, initial encounter: Secondary | ICD-10-CM | POA: Diagnosis present

## 2020-05-02 DIAGNOSIS — I440 Atrioventricular block, first degree: Secondary | ICD-10-CM | POA: Diagnosis not present

## 2020-05-02 DIAGNOSIS — R52 Pain, unspecified: Secondary | ICD-10-CM | POA: Diagnosis not present

## 2020-05-02 DIAGNOSIS — R0902 Hypoxemia: Secondary | ICD-10-CM | POA: Diagnosis not present

## 2020-05-02 MED ORDER — ACETAMINOPHEN 500 MG PO TABS
1000.0000 mg | ORAL_TABLET | Freq: Once | ORAL | Status: AC
  Administered 2020-05-02: 14:00:00 1000 mg via ORAL
  Filled 2020-05-02: qty 2

## 2020-05-02 NOTE — Discharge Instructions (Signed)
Use Tylenol for pain and fevers.  Up to 1000 mg per dose, up to 4 times per day.  Do not take more than 4000 mg of Tylenol/acetaminophen within 24 hours..  As we discussed, you broke your right ankle.  Please follow-up with Dr. Excell Seltzer or one of his colleagues in the clinic.  He is one of our local podiatrists.  You would likely need a surgery in a week or 2 to fix this.   Please keep your right foot in the boot provided and do not bear weight on your right foot.  Return to the ED with any fevers or uncontrolled pain.

## 2020-05-02 NOTE — ED Triage Notes (Signed)
Pt arrived via [facility] with c/o R ankle pain after injury to the area. Pt states mechanical fall, no LOC, no hx of blood thinners.   Unable to bear any weight on the extremity at this time. This RN visualized obvious swelling to R ankle. Pt states 10/10 pain.

## 2020-05-02 NOTE — ED Triage Notes (Signed)
First Rn Note: Pt to ED via [facility] with c/o R ankle pain at [employer] with c/o unwitnessed fall and possible syncopal episode. Per EMS pt with obvious R ankle swelling deformity. Per EMS unknown if passed out but doesn't remember the incident but has marks to his face.

## 2020-05-02 NOTE — ED Notes (Signed)
Pt states he was walking and all of the sudden had pain in the right ankle and fell to the floor. Pt has noted swelling to the ankle. Denies any other injury at this time.

## 2020-05-02 NOTE — ED Provider Notes (Signed)
[facility] Emergency Department Provider Note ____________________________________________   Event Date/Time   First MD Initiated Contact with Patient 05/02/20 1318     (approximate)  I have reviewed the triage vital signs and the nursing notes.  HISTORY  Chief Complaint Ankle Pain   HPI Charles Daniel is a 79 y.o. malewho presents to the ED for evaluation of right ankle pain  Chart review indicates history of CVA without residual deficits.  He remains on DAPT but no anticoagulation.  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  Patient reports being in his typical state of health until his right leg gave out from under him while he was walking today at work, causing him to fall down and strike his right ankle on the ground.  Denies syncope or head trauma.  Reporting isolated right ankle pain, and mild right knee pain.  5/10 intensity, aching and nonradiating.   Past Medical History:  Diagnosis Date  . Bilateral hydrocele 2012  . Depression   . Diverticulosis 2013   by colonoscopy  . Emphysema lung (HCC) 03/2014    by CXR, remote smoking history  . History of colon cancer    2007 --  S/P RECTOSIGMOID COLECTOMY--  NO CHEMORADIATION--  NO RECURRENCE  . History of CVA (cerebrovascular accident)    2003-  RIGHT MIDDLE CVA---   NO RESIDUAL  . History of hepatitis B    REMOTE AND INACTIVE  PER DOCUMENTATION  . History of syphilis    SECONDARY SYPHILITIS TX'D IN 1998  PER DOCUMENTATION  . History of tuberculosis    LATENT TB  TX'D X12  MONTHS IN 1995  . HIV infection (HCC)    DX 1995--  MONITORED BY INFECTIOUS DISEASE (DR CAMPBELL)  . Hypertension   . Prostate carcinoma [facility]) dx 09/2012   T1c, brachytherapy/seed implant Estelle June)    Patient Active Problem List   Diagnosis Date Noted  . Renal insufficiency 03/25/2020  . Prediabetes 03/25/2020  . Low HDL (under 40) 03/18/2020  . Diminished  pulses in lower extremity 09/04/2019  . Tobacco use 05/16/2019  . History of ischemic stroke 05/15/2019  . 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 TIA (transient ischemic attack) 11/18/2017  . 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 Advanced care planning/counseling discussion 06/26/2014  . 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 Prostate cancer (HCC) 11/08/2012  . Medicare annual wellness visit, subsequent 08/02/2012  . 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 History of rectal cancer 04/22/2010  . WEIGHT LOSS 12/18/2008  . CONSTIPATION 03/16/2008  . TUBERCULOSIS 05/15/2006  . Human immunodeficiency virus (HIV) disease (HCC) 05/15/2006  . SYPHILIS 05/15/2006  . 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  . Essential hypertension 05/15/2006  . History of stroke without residual deficits 05/15/2006  . HEPATITIS B, HX OF 05/15/2006  . GASTROINTESTINAL HEMORRHAGE, HX OF 05/15/2006    Past Surgical History:  Procedure Laterality Date  . COLONOSCOPY  08/2011   3 polyps, diverticulosis, rec rpt 5 yrs Arlyce Dice)  . COLONOSCOPY  10/2016   7 TAs, diverticulosis, no rpt recommended ([name])  . INGUINAL HERNIA REPAIR  1994   UNILATERAL  . LOW ANTERIOR RESECTION RECTOSIGMOID COLON  03-09-2006  . PROSTATE BIOPSY  09/2012   53cc  (MD OFFICE)  . RADIOACTIVE SEED IMPLANT N/A 01/20/2013   Procedure: RADIOACTIVE SEED IMPLANT;  Surgeon: Garnett Farm, MD;  as well as Rose Phi MD  . TRANSTHORACIC ECHOCARDIOGRAM  11-16-2001   LV WALL THICKNESS MODERATELY INCREASED/  EF 55-65%/ LVSF NORMAL    Prior to Admission medications   Medication Sig Start Date End Date Taking? Authorizing Provider  amLODipine (NORVASC) 5 MG tablet Take 1 tablet (5 mg total) by mouth daily. 03/25/20   [name], MD  aspirin EC 81 MG tablet Take 1 tablet (81 mg total) by mouth daily. Swallow whole. 03/26/20   [name], MD  atorvastatin (LIPITOR) 40 MG tablet Take 1 tablet (40 mg total) by mouth  daily. 03/25/20   [name], MD  clopidogrel (PLAVIX) 75 MG tablet Take 1 tablet (75 mg total) by mouth daily. 03/26/20   [name], MD  GENVOYA 150-150-200-10 MG TABS tablet TAKE 1 TABLET BY MOUTH DAILY WITH BREAKFAST. 03/21/20   Cliffton Asters, MD  lisinopril-hydrochlorothiazide (ZESTORETIC) 20-12.5 MG tablet Take 1 tablet by mouth daily. 03/25/20   [name], MD    Allergies Patient has no known allergies.  Family History  Problem Relation Age of Onset  . Cancer Sister        breast  . Breast cancer Sister   . Cancer Brother 22       prostate, treated with seed implant  . Cancer Brother        prostate  . Cancer Brother        prostate  . Cancer Daughter        breast  . Cancer Other        prostate  . Cancer Father        unsure  . CAD Neg Hx   . Stroke Neg Hx   . Diabetes Neg Hx     Social History Social History   Tobacco Use  . Smoking status: Current Every Day Smoker    Packs/day: 0.30    Years: 35.00    Pack years: 10.50    Types: Cigarettes    Last attempt to quit: 03/04/2009    Years since quitting: 11.1  . Smokeless tobacco: Never Used  Vaping Use  . Vaping Use: Never used  Substance Use Topics  . Alcohol use: No    Alcohol/week: 0.0 standard drinks  . Drug use: No    Review of Systems  Constitutional: No fever/chills Eyes: No visual changes. ENT: No sore throat. Cardiovascular: Denies chest pain. Respiratory: Denies shortness of breath. Gastrointestinal: No abdominal pain.  No nausea, no vomiting.  No diarrhea.  No constipation. Genitourinary: Negative for dysuria. Musculoskeletal: Negative for back pain.  Positive for right ankle pain Skin: Negative for rash. Neurological: Negative for headaches, focal weakness or numbness.   ____________________________________________   PHYSICAL EXAM:  VITAL SIGNS: Vitals:   05/02/20 1129 05/02/20 1341  BP: (!) 124/92 94/60  Pulse: (!) 58 67  Resp: 18 17  Temp: 98 F  (36.7 C)   SpO2: 96% 98%      Constitutional: Alert and oriented. Well appearing and in no acute distress. Eyes: Conjunctivae are normal. PERRL. EOMI. Head: Atraumatic. Nose: No congestion/rhinnorhea. Mouth/Throat: Mucous membranes are moist.  Oropharynx non-erythematous. Neck: No stridor. No cervical spine tenderness to palpation. Cardiovascular: Normal rate, regular rhythm. Grossly normal heart sounds.  Good peripheral circulation. Respiratory: Normal respiratory effort.  No retractions. Lungs CTAB. Gastrointestinal: Soft , nondistended, nontender to palpation. No CVA tenderness. Musculoskeletal:  Soft tissue swelling diffusely over the right ankle, without discrete laceration or evidence of open injury.  Right foot is distally neurovascularly intact.  Tenderness primarily over the lateral malleolus, but diffuse and  mild tenderness as well.  No calf or shin tenderness, minimal tenderness over his patella, but full ROM of his right knee. Full palpation of all 4 extremities otherwise without evidence of acute deformity or traumatic pathology. Neurologic:  Normal speech and language. No gross focal neurologic deficits are appreciated. Cranial nerves II through XII intact 5/5 strength and sensation in all 4 extremities Skin:  Skin is warm, dry and intact. No rash noted. Psychiatric: Mood and affect are normal. Speech and behavior are normal.  ____________________________________________   LABS (all labs ordered are listed, but only abnormal results are displayed)  Labs Reviewed - No data to display ____________________________________________  12 Lead EKG   ____________________________________________  RADIOLOGY  ED MD interpretation: Plain films of the right ankle reviewed by me with evidence of oblique fracture through the distal fibula with widening of his tibiotalar interval.  Plain film of the right knee without evidence of proximal fibular injury or other fracture,  reviewed by me  Official radiology report(s): DG Knee 2 Views Right  Result Date: 05/02/2020 CLINICAL DATA:  ipsilateral ankle fracture, eval proximalfibular EXAM: RIGHT KNEE - 1-2 VIEW COMPARISON:  None. FINDINGS: Physiologic alignment with approximation of the joints. No fracture or focal osseous lesion. Minimal femorotibial chondrocalcinosis. No joint effusion. Vascular calcifications. IMPRESSION: No acute osseous abnormality. 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 Electronically Signed   By: Primitivo Gauze M.D.   On: 05/02/2020 14:34   DG Ankle 2 Views Right  Result Date: 05/02/2020 CLINICAL DATA:  79 year old male with pain after injury EXAM: RIGHT ANKLE - 2 VIEW COMPARISON:  None. FINDINGS: Acute oblique fracture of the distal fibula, at the ankle mortise. The tibiotalar interval measures 6 mm. Circumferential soft tissue swelling at the ankle. No radiopaque foreign body. IMPRESSION: Acute oblique fracture of the distal fibula with widening of the tibiotalar interval concerning for talar subluxation. Electronically Signed   By: Corrie Mckusick D.O.   On: 05/02/2020 13:28    ____________________________________________   PROCEDURES and INTERVENTIONS  Procedure(s) performed (including Critical Care):  Procedures  Medications  acetaminophen (TYLENOL) tablet 1,000 mg (1,000 mg Oral Given 05/02/20 1342)    ____________________________________________   MDM / ED COURSE   79 year old male who is quite functional and healthy presents after mechanical fall with isolated ankle injury, found to have a distal fibular fracture that may need delayed operative repair, amenable to outpatient management.  Normal vitals on room air.  Exam with isolated area of trauma to his right ankle that appears to be a closed fracture.  He otherwise has no evidence of trauma, no distress and no neurovascular deficits.  Plain film confirms fracture to his distal fibula, and some  widening of his tibiotalar interval, due to this component I speak with podiatry on-call who recommends outpatient management and nonweightbearing status.  We provide the patient a boot, crutches and follow-up information for podiatry.  We discussed return precautions for the ED.  Patient medically stable for discharge home.   Clinical Course as of 05/02/20 1640  Thu May 02, 2020  1334 Mauricio Po, podiatry [DS]  [phone] Left voicemail for callback [DS]  1344 Spoke with Dr. Luana Shu, podiatrist on call. He recommends immobilization and outpatient follow-up. [DS]    Clinical Course User Index [DS] Vladimir Crofts, MD    ____________________________________________   FINAL CLINICAL IMPRESSION(S) / ED DIAGNOSES  Final diagnoses:  Acute right ankle pain  Other closed fracture of distal end of right fibula, initial encounter  ED Discharge Orders    None       Chrystal Zeimet Tamala Julian   Note:  This document was prepared using Dragon voice recognition software and may include unintentional dictation errors.   Vladimir Crofts, MD 05/02/20 [phone]

## 2020-05-07 DIAGNOSIS — M25571 Pain in right ankle and joints of right foot: Secondary | ICD-10-CM | POA: Diagnosis not present

## 2020-05-07 DIAGNOSIS — S82841A Displaced bimalleolar fracture of right lower leg, initial encounter for closed fracture: Secondary | ICD-10-CM | POA: Diagnosis not present

## 2020-05-14 ENCOUNTER — Other Ambulatory Visit: Payer: Self-pay | Admitting: Family Medicine

## 2020-05-16 MED FILL — GENVOYA TABLET: 150-150-200 | 30 days supply | Qty: 30 | Fill #2

## 2020-05-21 DIAGNOSIS — M25571 Pain in right ankle and joints of right foot: Secondary | ICD-10-CM | POA: Diagnosis not present

## 2020-05-21 DIAGNOSIS — S82841D Displaced bimalleolar fracture of right lower leg, subsequent encounter for closed fracture with routine healing: Secondary | ICD-10-CM | POA: Diagnosis not present

## 2020-06-13 MED FILL — GENVOYA TABLET: 150-150-200 | 30 days supply | Qty: 30 | Fill #3

## 2020-06-27 DIAGNOSIS — I10 Essential (primary) hypertension: Secondary | ICD-10-CM | POA: Diagnosis not present

## 2020-06-27 DIAGNOSIS — Z720 Tobacco use: Secondary | ICD-10-CM | POA: Diagnosis not present

## 2020-06-27 DIAGNOSIS — Z8673 Personal history of transient ischemic attack (TIA), and cerebral infarction without residual deficits: Secondary | ICD-10-CM | POA: Diagnosis not present

## 2020-07-01 ENCOUNTER — Ambulatory Visit: Payer: Medicare Other | Admitting: Family Medicine

## 2020-07-11 MED FILL — GENVOYA TABLET: 150-150-200 | 30 days supply | Qty: 30 | Fill #4

## 2020-07-26 ENCOUNTER — Other Ambulatory Visit (HOSPITAL_COMMUNITY): Payer: Self-pay

## 2020-08-07 ENCOUNTER — Other Ambulatory Visit (HOSPITAL_COMMUNITY): Payer: Self-pay

## 2020-08-21 ENCOUNTER — Other Ambulatory Visit (HOSPITAL_COMMUNITY): Payer: Self-pay

## 2020-08-21 MED FILL — Elvitegrav-Cobic-Emtricitab-Tenofov AF Tab 150-150-200-10 MG: ORAL | 30 days supply | Qty: 30 | Fill #0 | Status: AC

## 2020-08-22 ENCOUNTER — Other Ambulatory Visit (HOSPITAL_COMMUNITY): Payer: Self-pay

## 2020-08-29 ENCOUNTER — Other Ambulatory Visit (HOSPITAL_COMMUNITY): Payer: Self-pay

## 2020-08-30 ENCOUNTER — Ambulatory Visit: Payer: Medicare Other | Admitting: Family Medicine

## 2020-08-30 DIAGNOSIS — Z0289 Encounter for other administrative examinations: Secondary | ICD-10-CM

## 2020-09-17 ENCOUNTER — Other Ambulatory Visit: Payer: Self-pay | Admitting: Internal Medicine

## 2020-09-17 ENCOUNTER — Other Ambulatory Visit (HOSPITAL_COMMUNITY): Payer: Self-pay

## 2020-09-17 DIAGNOSIS — B20 Human immunodeficiency virus [HIV] disease: Secondary | ICD-10-CM

## 2020-09-17 MED ORDER — GENVOYA 150-150-200-10 MG PO TABS
1.0000 | ORAL_TABLET | Freq: Every day | ORAL | 2 refills | Status: DC
  Filled 2020-09-17: qty 30, 30d supply, fill #0
  Filled 2020-10-29: qty 30, 30d supply, fill #1
  Filled 2020-11-18: qty 30, 30d supply, fill #2

## 2020-09-19 ENCOUNTER — Other Ambulatory Visit (HOSPITAL_COMMUNITY): Payer: Self-pay

## 2020-10-14 ENCOUNTER — Other Ambulatory Visit (HOSPITAL_COMMUNITY): Payer: Self-pay

## 2020-10-14 ENCOUNTER — Encounter: Payer: Self-pay | Admitting: Family Medicine

## 2020-10-14 ENCOUNTER — Ambulatory Visit (INDEPENDENT_AMBULATORY_CARE_PROVIDER_SITE_OTHER): Payer: Medicare Other | Admitting: Family Medicine

## 2020-10-14 ENCOUNTER — Other Ambulatory Visit: Payer: Self-pay

## 2020-10-14 VITALS — BP 118/62 | HR 76 | Temp 98.0°F | Ht 66.0 in | Wt 136.6 lb

## 2020-10-14 DIAGNOSIS — J439 Emphysema, unspecified: Secondary | ICD-10-CM | POA: Diagnosis not present

## 2020-10-14 DIAGNOSIS — B20 Human immunodeficiency virus [HIV] disease: Secondary | ICD-10-CM | POA: Diagnosis not present

## 2020-10-14 DIAGNOSIS — Z720 Tobacco use: Secondary | ICD-10-CM | POA: Diagnosis not present

## 2020-10-14 DIAGNOSIS — J22 Unspecified acute lower respiratory infection: Secondary | ICD-10-CM | POA: Insufficient documentation

## 2020-10-14 MED ORDER — AZITHROMYCIN 250 MG PO TABS: ORAL_TABLET | ORAL | 0 refills | Status: DC

## 2020-10-14 NOTE — Assessment & Plan Note (Addendum)
Encouraged full cessation.  ?

## 2020-10-14 NOTE — Assessment & Plan Note (Signed)
Overall reassuring vitals and exam except for coarse breath sounds/rhonchi. In HIV and COPD, will cover for atypical infection with azithromycin course. Supportive care measures reviewed. Update if not improving with treatment. Pt agrees with plan.

## 2020-10-14 NOTE — Progress Notes (Signed)
Patient ID: Charles Daniel, male    DOB: 05-06-1940, 80 y.o.   MRN: [id number]  This visit was conducted in person.  BP 118/62   Pulse 76   Temp 98 F (36.7 C) (Temporal)   Ht 5\' 6"  (1.676 m)   Wt 136 lb 9 oz (61.9 kg)   SpO2 95%   BMI 22.04 kg/m    CC: cough Subjective:   HPI: Charles Daniel is a 80 y.o. male presenting on 10/14/2020 for Cough (C/o productive cough and runny nose.  Sxs started about 1 wk ago.  Tried Mucinex, not helpful.  States he gets same sxs every year about this time. )   1 wk h/o productive cough (clear mucous) associated with rhinorrhea and mild wheezing. Sleeping well - cough is not keeping him up . No sick contacts at home.   No fevers/chills, ear or tooth pain, body aches, ST, abd pain, nausea, diarrhea, loss of taste or smell.   No h/o asthma.  Tried mucinex at home with benefit.  Cutting down - smoked 1 pack in past 2 months, none in several weeks.   Suffered mechanical fall at home with distal R fibular fracture 04/2020, followed by Dr Vickki Muff. Underwent physical therapy x4 months.   Upcoming neurology appointment for h/o stroke.      Relevant past medical, surgical, family and social history reviewed and updated as indicated. Interim medical history since our last visit reviewed. Allergies and medications reviewed and updated. Outpatient Medications Prior to Visit  Medication Sig Dispense Refill   amLODipine (NORVASC) 5 MG tablet Take 1 tablet (5 mg total) by mouth daily. 90 tablet 3   aspirin EC 81 MG tablet Take 1 tablet (81 mg total) by mouth daily. Swallow whole. 90 tablet 3   atorvastatin (LIPITOR) 40 MG tablet Take 1 tablet (40 mg total) by mouth daily. 90 tablet 3   clopidogrel (PLAVIX) 75 MG tablet Take 1 tablet (75 mg total) by mouth daily. 90 tablet 3   GENVOYA 150-150-200-10 MG TABS tablet TAKE 1 TABLET BY MOUTH DAILY WITH BREAKFAST. 30 tablet 2   lisinopril-hydrochlorothiazide (ZESTORETIC) 20-12.5 MG tablet Take 1 tablet by mouth  daily. 90 tablet 3   No facility-administered medications prior to visit.     Per HPI unless specifically indicated in ROS section below Review of Systems Objective:  BP 118/62   Pulse 76   Temp 98 F (36.7 C) (Temporal)   Ht 5\' 6"  (1.676 m)   Wt 136 lb 9 oz (61.9 kg)   SpO2 95%   BMI 22.04 kg/m   Wt Readings from Last 3 Encounters:  10/14/20 136 lb 9 oz (61.9 kg)  05/02/20 138 lb (62.6 kg)  03/25/20 134 lb 2 oz (60.8 kg)      Physical Exam Vitals and nursing note reviewed.  Constitutional:      Appearance: Normal appearance. He is not ill-appearing.  Cardiovascular:     Rate and Rhythm: Normal rate and regular rhythm.     Pulses: Normal pulses.     Heart sounds: Normal heart sounds. No murmur heard. Pulmonary:     Effort: Pulmonary effort is normal. No respiratory distress.     Breath sounds: Rhonchi (L sided) present. No wheezing or rales.  Musculoskeletal:     Right lower leg: No edema.     Left lower leg: No edema.  Skin:    General: Skin is warm and dry.     Findings: No rash.  Neurological:  Mental Status: He is alert.  Psychiatric:        Mood and Affect: Mood normal.        Behavior: Behavior normal.      Assessment & Plan:  This visit occurred during the SARS-CoV-2 public health emergency.  Safety protocols were in place, including screening questions prior to the visit, additional usage of staff PPE, and extensive cleaning of exam room while observing appropriate contact time as indicated for disinfecting solutions.   Problem List Items Addressed This Visit     Human immunodeficiency virus (HIV) disease ([place])    Sees ID yearly in the summer.       Relevant Medications   azithromycin (ZITHROMAX) 250 MG tablet   COPD with emphysema ([place])    H/o this, overall stable period.        Relevant Medications   azithromycin (ZITHROMAX) 250 MG tablet   Tobacco use    Encouraged full cessation.        Acute lower respiratory infection - Primary     Overall reassuring vitals and exam except for coarse breath sounds/rhonchi. In HIV and COPD, will cover for atypical infection with azithromycin course. Supportive care measures reviewed. Update if not improving with treatment. Pt agrees with plan.        Relevant Medications   azithromycin (ZITHROMAX) 250 MG tablet     Meds ordered this encounter  Medications   azithromycin (ZITHROMAX) 250 MG tablet    Sig: Take two tablets on day one followed by one tablet on days 2-5    Dispense:  6 each    Refill:  0    No orders of the defined types were placed in this encounter.   Patient Instructions  Take zpack antibiotic for cough.  Push fluids and rest.  Let us know if not improving with this.    Follow up plan: Return if symptoms worsen or fail to improve.  [name], MD

## 2020-10-14 NOTE — Assessment & Plan Note (Signed)
Sees ID yearly in the summer.

## 2020-10-14 NOTE — Assessment & Plan Note (Signed)
H/o this, overall stable period.

## 2020-10-14 NOTE — Patient Instructions (Addendum)
Take zpack antibiotic for cough.  Push fluids and rest.  Let us know if not improving with this.

## 2020-10-18 ENCOUNTER — Other Ambulatory Visit (HOSPITAL_COMMUNITY): Payer: Self-pay

## 2020-10-29 ENCOUNTER — Other Ambulatory Visit (HOSPITAL_COMMUNITY): Payer: Self-pay

## 2020-11-11 ENCOUNTER — Other Ambulatory Visit: Payer: Self-pay | Admitting: Family Medicine

## 2020-11-11 DIAGNOSIS — I10 Essential (primary) hypertension: Secondary | ICD-10-CM

## 2020-11-18 ENCOUNTER — Other Ambulatory Visit (HOSPITAL_COMMUNITY): Payer: Self-pay

## 2020-11-25 ENCOUNTER — Other Ambulatory Visit (HOSPITAL_COMMUNITY): Payer: Self-pay

## 2020-12-02 ENCOUNTER — Other Ambulatory Visit: Payer: Medicare Other

## 2020-12-02 ENCOUNTER — Other Ambulatory Visit: Payer: Self-pay

## 2020-12-02 DIAGNOSIS — B20 Human immunodeficiency virus [HIV] disease: Secondary | ICD-10-CM

## 2020-12-03 LAB — T-HELPER CELL (CD4) - (RCID CLINIC ONLY)
CD4 % Helper T Cell: 41 % (ref 33–65)
CD4 T Cell Abs: 975 /uL (ref 400–1790)

## 2020-12-04 LAB — COMPREHENSIVE METABOLIC PANEL
AG Ratio: 1.2 (calc) (ref 1.0–2.5)
ALT: 17 U/L (ref 9–46)
AST: 21 U/L (ref 10–35)
Albumin: 4 g/dL (ref 3.6–5.1)
Alkaline phosphatase (APISO): 94 U/L (ref 35–144)
BUN/Creatinine Ratio: 8 (calc) (ref 6–22)
BUN: 13 mg/dL (ref 7–25)
CO2: 26 mmol/L (ref 20–32)
Calcium: 9 mg/dL (ref 8.6–10.3)
Chloride: 107 mmol/L (ref 98–110)
Creat: 1.57 mg/dL — ABNORMAL HIGH (ref 0.70–1.22)
Globulin: 3.4 g/dL (calc) (ref 1.9–3.7)
Glucose, Bld: 92 mg/dL (ref 65–99)
Potassium: 3.8 mmol/L (ref 3.5–5.3)
Sodium: 141 mmol/L (ref 135–146)
Total Bilirubin: 0.5 mg/dL (ref 0.2–1.2)
Total Protein: 7.4 g/dL (ref 6.1–8.1)

## 2020-12-04 LAB — CBC
HCT: 39.2 % (ref 38.5–50.0)
Hemoglobin: 12.7 g/dL — ABNORMAL LOW (ref 13.2–17.1)
MCH: 27.9 pg (ref 27.0–33.0)
MCHC: 32.4 g/dL (ref 32.0–36.0)
MCV: 86 fL (ref 80.0–100.0)
MPV: 11 fL (ref 7.5–12.5)
Platelets: 230 10*3/uL (ref 140–400)
RBC: 4.56 10*6/uL (ref 4.20–5.80)
RDW: 14.9 % (ref 11.0–15.0)
WBC: 8.4 10*3/uL (ref 3.8–10.8)

## 2020-12-04 LAB — RPR TITER: RPR Titer: 1:2 {titer} — ABNORMAL HIGH

## 2020-12-04 LAB — FLUORESCENT TREPONEMAL AB(FTA)-IGG-BLD: Fluorescent Treponemal ABS: REACTIVE — AB

## 2020-12-04 LAB — HIV-1 RNA QUANT-NO REFLEX-BLD
HIV 1 RNA Quant: NOT DETECTED Copies/mL
HIV-1 RNA Quant, Log: NOT DETECTED Log cps/mL

## 2020-12-04 LAB — RPR: RPR Ser Ql: REACTIVE — AB

## 2020-12-16 ENCOUNTER — Other Ambulatory Visit: Payer: Self-pay | Admitting: Internal Medicine

## 2020-12-16 ENCOUNTER — Other Ambulatory Visit (HOSPITAL_COMMUNITY): Payer: Self-pay

## 2020-12-16 DIAGNOSIS — B20 Human immunodeficiency virus [HIV] disease: Secondary | ICD-10-CM

## 2020-12-16 NOTE — Telephone Encounter (Signed)
Appt 8/16

## 2020-12-17 ENCOUNTER — Other Ambulatory Visit: Payer: Self-pay | Admitting: Internal Medicine

## 2020-12-17 ENCOUNTER — Other Ambulatory Visit: Payer: Self-pay

## 2020-12-17 ENCOUNTER — Ambulatory Visit (INDEPENDENT_AMBULATORY_CARE_PROVIDER_SITE_OTHER): Payer: Medicare Other | Admitting: Internal Medicine

## 2020-12-17 ENCOUNTER — Encounter: Payer: Self-pay | Admitting: Internal Medicine

## 2020-12-17 ENCOUNTER — Other Ambulatory Visit (HOSPITAL_COMMUNITY): Payer: Self-pay

## 2020-12-17 DIAGNOSIS — B20 Human immunodeficiency virus [HIV] disease: Secondary | ICD-10-CM

## 2020-12-17 MED ORDER — GENVOYA 150-150-200-10 MG PO TABS
1.0000 | ORAL_TABLET | Freq: Every day | ORAL | 11 refills | Status: DC
Start: 2020-12-17 — End: 2020-12-17

## 2020-12-17 MED ORDER — GENVOYA 150-150-200-10 MG PO TABS
1.0000 | ORAL_TABLET | Freq: Every day | ORAL | 11 refills | Status: DC
  Filled 2020-12-17: qty 30, 30d supply, fill #0
  Filled 2021-01-22: qty 30, 30d supply, fill #1
  Filled 2021-02-17: qty 30, 30d supply, fill #2
  Filled 2021-03-18: qty 30, 30d supply, fill #3
  Filled 2021-04-24: qty 30, 30d supply, fill #4
  Filled 2021-05-30: qty 30, 30d supply, fill #5
  Filled 2021-07-04: qty 30, 30d supply, fill #6
  Filled 2021-07-25: qty 30, 30d supply, fill #7
  Filled 2021-08-19: qty 30, 30d supply, fill #8
  Filled 2021-09-22: qty 30, 30d supply, fill #9
  Filled 2021-10-16: qty 30, 30d supply, fill #10
  Filled 2021-11-12: qty 30, 30d supply, fill #11

## 2020-12-17 NOTE — Assessment & Plan Note (Signed)
His infection remains under excellent, long-term control.  He will continue Genvoya and follow-up after lab work in 1 year. 

## 2020-12-17 NOTE — Progress Notes (Signed)
Patient Active Problem List   Diagnosis Date Noted   History of rectal cancer 04/22/2010    Priority: High   Human immunodeficiency virus (HIV) disease ([place]) 05/15/2006    Priority: High   Essential hypertension 05/15/2006    Priority: High   History of stroke without residual deficits 05/15/2006    Priority: High   Renal insufficiency 03/25/2020   Prediabetes 03/25/2020   Low HDL (under 40) 03/18/2020   Diminished pulses in lower extremity 09/04/2019   Tobacco use 05/16/2019   History of ischemic stroke 05/15/2019   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   TIA (transient ischemic attack) 11/18/2017   COPD with emphysema ([place]) 10/18/2015   Advanced care planning/counseling discussion [id number]   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   ASPIRIN EC 81 MG TABLET    Take 1 tablet (81 mg total) by mouth daily. Swallow whole.   ATORVASTATIN (LIPITOR) 40 MG TABLET    Take 1 tablet (40 mg total) by mouth daily.   AZITHROMYCIN (ZITHROMAX) 250 MG TABLET    Take two tablets on day one followed by one tablet on days 2-5   CLOPIDOGREL (PLAVIX) 75 MG TABLET    Take 1 tablet (75 mg total) by mouth daily.   CLOPIDOGREL (PLAVIX) 75 MG TABLET    Take 75 mg by mouth daily.   LISINOPRIL-HYDROCHLOROTHIAZIDE (ZESTORETIC) 20-12.5 MG TABLET    Take 1 tablet by mouth daily.  Modified Medications   Modified Medication Previous Medication   GENVOYA  150-150-200-10 MG TABS TABLET GENVOYA 150-150-200-10 MG TABS tablet      TAKE 1 TABLET BY MOUTH DAILY WITH BREAKFAST.    TAKE 1 TABLET BY MOUTH DAILY WITH BREAKFAST.  Discontinued Medications   No medications on file    Subjective: Shray is in for his routine HIV follow-up visit.  He has not had any problems obtaining, taking or tolerating his Genvoya and says that he thinks he has missed only 1 dose in the past year.  He is feeling well.  His wife is in the hospital currently with GI bleeding.  Review of Systems: Review of Systems  Constitutional:  Negative for fever and weight loss.  Respiratory:  Negative for cough and shortness of breath.   Cardiovascular:  Negative for chest pain.   Past Medical History:  Diagnosis Date   Bilateral hydrocele 2012   Depression    Diverticulosis 2013   by colonoscopy   Emphysema lung ([place]) 03/2014    by CXR, remote smoking history   History of colon cancer    2007 --  S/P RECTOSIGMOID COLECTOMY--  NO CHEMORADIATION--  NO RECURRENCE   History of CVA (cerebrovascular accident)    2003-  RIGHT MIDDLE CVA---   NO RESIDUAL   History of hepatitis B    REMOTE AND INACTIVE  PER DOCUMENTATION   History of syphilis    SECONDARY SYPHILITIS TX'D IN 1998  PER DOCUMENTATION   History of tuberculosis    LATENT TB  TX'D X12  MONTHS IN 1995   HIV infection ([place])    [place] (DR Jaideep Pollack)   Hypertension    Prostate carcinoma ([place]) dx 09/2012   T1c, brachytherapy/seed implant Karsten Ro, [place])    Social History   Tobacco Use   Smoking status: Former    Packs/day: 0.30    Years: 35.00    Pack years: 10.50    Types: Cigarettes    Quit date: 05/02/2020    Years since quitting: 0.6   Smokeless tobacco: Never  Vaping Use   Vaping Use: Never used  Substance Use Topics   Alcohol use: No    Alcohol/week: 0.0 standard drinks   Drug use: No    Family History  Problem Relation Age of Onset   Cancer Sister         breast   Breast cancer Sister    Cancer Brother 43       prostate, treated with seed implant   Cancer Brother        prostate   Cancer Brother        prostate   Cancer Daughter        breast   Cancer Other        prostate   Cancer Father        unsure   CAD Neg Hx    Stroke Neg Hx    Diabetes Neg Hx     No Known Allergies  Health Maintenance  Topic Date Due   Zoster Vaccines- Shingrix (1 of 2) Never done   COVID-19 Vaccine (4 - Booster for Moderna series) 06/01/2020   INFLUENZA VACCINE  12/02/2020   TETANUS/TDAP  08/03/2022   PNA vac Low Risk Adult  Completed   HPV VACCINES  Aged Out    Objective:  Vitals:   12/17/20 1338  BP: 125/72  Pulse: 71  Temp: 97.9 F (36.6 C)  TempSrc: Oral  Weight: 136 lb (61.7 kg)   Body mass index is 21.95 kg/m.  Physical Exam Constitutional:      Comments: He is in good spirits as usual.  Cardiovascular:     Rate and Rhythm: Normal rate.     Pulses: Normal pulses.  Psychiatric:        Mood and Affect: Mood normal.    Lab Results Lab Results  Component Value Date   WBC 8.4 12/02/2020   HGB 12.7 (L) 12/02/2020   HCT 39.2 12/02/2020   MCV 86.0 12/02/2020   PLT 230 12/02/2020    Lab Results  Component Value Date   CREATININE 1.57 (H) 12/02/2020   BUN 13 12/02/2020   NA 141 12/02/2020   K 3.8 12/02/2020   CL 107 12/02/2020   CO2 26 12/02/2020    Lab Results  Component Value Date   ALT 17 12/02/2020   AST 21 12/02/2020   ALKPHOS 75 03/18/2020   BILITOT 0.5 12/02/2020    Lab Results  Component Value Date   CHOL 117 03/18/2020   HDL 34.20 (L) 03/18/2020   LDLCALC 65 03/18/2020   TRIG 87.0 03/18/2020   CHOLHDL 3 03/18/2020   Lab Results  Component Value Date   LABRPR REACTIVE (A) 12/02/2020   RPRTITER 1:2 (H) 12/02/2020   HIV 1 RNA Quant  Date Value  12/02/2020 Not Detected Copies/mL  12/05/2019 <20 Copies/mL  11/17/2018 <20 NOT DETECTED copies/mL   CD4 T Cell  Abs (/uL)  Date Value  12/02/2020  975  12/05/2019 1,140  11/17/2018 976     Problem List Items Addressed This Visit       High   Human immunodeficiency virus (HIV) disease ([place])    His infection remains under excellent, long-term control.  He will continue Genvoya and follow-up after lab work in 1 year.      Relevant Medications   GENVOYA 150-150-200-10 MG TABS tablet   Other Relevant Orders   CBC   T-helper cell (CD4)- ([facility] clinic only)   Comprehensive metabolic panel   RPR   HIV-1 RNA quant-no reflex-bld      Michel Bickers, MD [facility] [place] [phone] pager   [phone] cell 12/17/2020, 1:49 PM

## 2020-12-23 ENCOUNTER — Other Ambulatory Visit (HOSPITAL_COMMUNITY): Payer: Self-pay

## 2021-01-16 ENCOUNTER — Other Ambulatory Visit (HOSPITAL_COMMUNITY): Payer: Self-pay

## 2021-01-20 ENCOUNTER — Other Ambulatory Visit (HOSPITAL_COMMUNITY): Payer: Self-pay

## 2021-01-22 ENCOUNTER — Other Ambulatory Visit (HOSPITAL_COMMUNITY): Payer: Self-pay

## 2021-01-27 ENCOUNTER — Telehealth: Payer: Self-pay | Admitting: Family Medicine

## 2021-01-27 NOTE — Chronic Care Management (AMB) (Signed)
  Chronic Care Management   Outreach Note  01/27/2021 Name: Sylvanus Telford MRN: [id number] DOB: March 14, 1941  Referred by: [name], MD Reason for referral : No chief complaint on file.   An unsuccessful telephone outreach was attempted today. The patient was referred to the pharmacist for assistance with care management and care coordination.   Follow Up Plan:   Tatjana Dellinger Upstream Scheduler

## 2021-01-28 DIAGNOSIS — M25571 Pain in right ankle and joints of right foot: Secondary | ICD-10-CM | POA: Diagnosis not present

## 2021-02-03 ENCOUNTER — Telehealth: Payer: Self-pay | Admitting: Family Medicine

## 2021-02-03 NOTE — Progress Notes (Signed)
  Chronic Care Management   Outreach Note  02/03/2021 Name: Dorance Spink MRN: [id number] DOB: 1940-11-23  Referred by: [name], MD Reason for referral : No chief complaint on file.   A second unsuccessful telephone outreach was attempted today. The patient was referred to pharmacist for assistance with care management and care coordination.  Follow Up Plan:   Tatjana Dellinger Upstream Scheduler

## 2021-02-10 ENCOUNTER — Telehealth: Payer: Self-pay | Admitting: Family Medicine

## 2021-02-10 NOTE — Chronic Care Management (AMB) (Signed)
  Chronic Care Management   Outreach Note  02/10/2021 Name: Charles Daniel MRN: [id number] DOB: [birth year]  Referred by: [name], MD Reason for referral : No chief complaint on file.   Third unsuccessful telephone outreach was attempted today. The patient was referred to the pharmacist for assistance with care management and care coordination.   Follow Up Plan:   Tatjana Dellinger Upstream Scheduler

## 2021-02-17 ENCOUNTER — Other Ambulatory Visit: Payer: Self-pay | Admitting: Family Medicine

## 2021-02-17 ENCOUNTER — Other Ambulatory Visit (HOSPITAL_COMMUNITY): Payer: Self-pay

## 2021-02-19 ENCOUNTER — Other Ambulatory Visit (HOSPITAL_COMMUNITY): Payer: Self-pay

## 2021-03-18 ENCOUNTER — Other Ambulatory Visit (HOSPITAL_COMMUNITY): Payer: Self-pay

## 2021-03-20 ENCOUNTER — Telehealth: Payer: Self-pay

## 2021-03-20 ENCOUNTER — Other Ambulatory Visit (HOSPITAL_COMMUNITY): Payer: Self-pay

## 2021-03-20 NOTE — Telephone Encounter (Signed)
[facility] Patient Advocate Encounter   I was successful in securing patient a $ 7500.00 grant from [employer] to provide copayment coverage for Genvoya.  The patient's out of pocket cost will be 0.00 monthly.     I have spoken with the patient.    The billing information is as follows and has been shared with [facility].         Dates of Eligibility: 03/20/21 through 05/03/21  Patient knows to call the office with questions or concerns.  Ileene Patrick, [facility] Patient [facility] Phone: [phone] Fax:  [phone]

## 2021-04-15 ENCOUNTER — Other Ambulatory Visit (HOSPITAL_COMMUNITY): Payer: Self-pay

## 2021-04-21 ENCOUNTER — Other Ambulatory Visit (HOSPITAL_COMMUNITY): Payer: Self-pay

## 2021-04-24 ENCOUNTER — Other Ambulatory Visit (HOSPITAL_COMMUNITY): Payer: Self-pay

## 2021-04-30 ENCOUNTER — Other Ambulatory Visit: Payer: Self-pay

## 2021-04-30 MED ORDER — ATORVASTATIN CALCIUM 40 MG PO TABS: 40.0000 mg | ORAL_TABLET | Freq: Every day | ORAL | 0 refills | Status: DC

## 2021-04-30 NOTE — Telephone Encounter (Signed)
E-scribed refill.  Plz schedule wellness, lab and cpe visits.  

## 2021-05-07 NOTE — Telephone Encounter (Signed)
Tried to reach out to pt but no answer or answering service

## 2021-05-08 ENCOUNTER — Encounter: Payer: Self-pay | Admitting: Family Medicine

## 2021-05-08 NOTE — Telephone Encounter (Signed)
2nd attempt  Unable to lm, letter mailed

## 2021-05-17 ENCOUNTER — Other Ambulatory Visit: Payer: Self-pay | Admitting: Family Medicine

## 2021-05-23 ENCOUNTER — Other Ambulatory Visit (HOSPITAL_COMMUNITY): Payer: Self-pay

## 2021-05-26 ENCOUNTER — Other Ambulatory Visit (HOSPITAL_COMMUNITY): Payer: Self-pay

## 2021-05-29 ENCOUNTER — Ambulatory Visit
Admission: EM | Admit: 2021-05-29 | Discharge: 2021-05-29 | Disposition: A | Discharge status: Home / Self Care | Payer: Medicare Other | Attending: Physician Assistant | Admitting: Physician Assistant

## 2021-05-29 ENCOUNTER — Telehealth: Payer: Self-pay

## 2021-05-29 ENCOUNTER — Encounter: Payer: Self-pay | Admitting: Emergency Medicine

## 2021-05-29 ENCOUNTER — Ambulatory Visit (INDEPENDENT_AMBULATORY_CARE_PROVIDER_SITE_OTHER): Payer: Medicare Other

## 2021-05-29 ENCOUNTER — Other Ambulatory Visit: Payer: Self-pay

## 2021-05-29 DIAGNOSIS — R059 Cough, unspecified: Secondary | ICD-10-CM | POA: Diagnosis not present

## 2021-05-29 DIAGNOSIS — J209 Acute bronchitis, unspecified: Secondary | ICD-10-CM

## 2021-05-29 MED ORDER — ALBUTEROL SULFATE HFA 108 (90 BASE) MCG/ACT IN AERS
4.0000 | INHALATION_SPRAY | Freq: Once | RESPIRATORY_TRACT | Status: AC
  Administered 2021-05-29: 4 via RESPIRATORY_TRACT

## 2021-05-29 MED ORDER — PREDNISONE 50 MG PO TABS: ORAL_TABLET | ORAL | 0 refills | Status: DC

## 2021-05-29 MED ORDER — DOXYCYCLINE HYCLATE 100 MG PO CAPS: 100.0000 mg | ORAL_CAPSULE | Freq: Two times a day (BID) | ORAL | 0 refills | Status: DC

## 2021-05-29 NOTE — Telephone Encounter (Signed)
[place] Night - Client TELEPHONE ADVICE RECORD [facility] Patient Name: Charles Daniel Gender: Male DOB: [birth year] Age: 81 Y 1 M 19 D Return Phone Number: [phone] (Primary) Address: City/ State/ Zip: [place]  [address] Client [place] Night - Client Client Site [place] Provider [name] - MD Contact Type Call Who Is Calling Patient / Member / Family / Caregiver Call Type Triage / Clinical Relationship To Patient Self Return Phone Number [phone] (Primary) Chief Complaint Cough Reason for Call Symptomatic / Request for Health Information Initial Comment Caller state he is sick. Symptoms include congestion, cough, runny. nose sneezing. He is having trouble breathing. He is wheezing. Translation No Nurse Assessment Nurse: Renne Crigler, RN, Sherlie Ban Date/Time (Eastern Time): 05/29/2021 7:35:34 AM Confirm and document reason for call. If symptomatic, describe symptoms. ---Caller state he is sick. Symptoms include congestion, productive cough with clear mucous, nose sneezing. He is having trouble breathing. He is wheezing. Symptoms x2 days. Denies fever. Does the patient have any new or worsening symptoms? ---Yes Will a triage be completed? ---Yes Related visit to physician within the last 2 weeks? ---No Does the PT have any chronic conditions? (i.e. diabetes, asthma, this includes High risk factors for pregnancy, etc.) ---Yes List chronic conditions. ---HTN Is this a behavioral health or substance abuse call? ---No Guidelines Guideline Title Affirmed Question Affirmed Notes Nurse Date/Time (Eastern Time) Breathing Difficulty [1] MILD difficulty breathing (e.g., minimal/no SOB at rest, SOB with walking, pulse <100) AND [2] NEW-onset or WORSE than normal Hearon, RN, Sherlie Ban 05/29/2021 7:36:40 AM PLEASE NOTE: All timestamps contained within this report are  represented as [place] Standard Time. CONFIDENTIALTY NOTICE: This fax transmission is intended only for the addressee. It contains information that is legally privileged, confidential or otherwise protected from use or disclosure. If you are not the intended recipient, you are strictly prohibited from reviewing, disclosing, copying using or disseminating any of this information or taking any action in reliance on or regarding this information. If you have received this fax in error, please notify us immediately by telephone so that we can arrange for its return to [place]. Phone: [phone], Toll-Free: [phone], Fax: [phone] Page: 2 of 2 Call Id: [id number] Guidelines Guideline Title Affirmed Question Affirmed Notes Nurse Date/Time Eilene Ghazi Time) Dizziness - Lightheadedness [1] MILD dizziness (e.g., walking normally) AND [2] has NOT been evaluated by physician for this (Exception: dizziness caused by heat exposure, sudden standing, or poor fluid intake) Hearon, RN, Sherlie Ban 05/29/2021 7:40:09 AM Disp. Time Eilene Ghazi Time) Disposition Final User 05/29/2021 7:33:44 AM Send to Urgent Clovis Cao, [place] 05/29/2021 7:39:56 AM See HCP within 4 Hours (or PCP triage) Renne Crigler, RN, Sherlie Ban 05/29/2021 7:42:40 AM SEE PCP WITHIN 3 DAYS Yes Hearon, RN, Melina Schools Disagree/Comply Comply Caller Understands Yes PreDisposition Did not know what to do Care Advice Given Per Guideline SEE HCP (OR PCP TRIAGE) WITHIN 4 HOURS: * IF OFFICE WILL BE OPEN: You need to be seen within the next 3 or 4 hours. Call your doctor (or NP/PA) now or as soon as the office opens. CARE ADVICE given per Breathing Difficulty (Adult) guideline. CALL BACK IF: * You become worse SEE PCP WITHIN 3 DAYS: * You need to be seen within 2 or 3 days. CARE ADVICE given per Dizziness (Adult) guideline. * You become worse CALL BACK IF: Referrals REFERRED TO PCP OFFIC

## 2021-05-29 NOTE — Telephone Encounter (Signed)
Spoke with pt relaying Dr. Synthia Innocent message.  Pt verbalizes understanding and does not want to come to [facility].  Dr. Alla German earliest is 3:45 today.  Pt wants to be seen now.  I suggested UC.  Pt agrees and is on his way to [place].  Fyi to Dr. Darnell Level.

## 2021-05-29 NOTE — Telephone Encounter (Signed)
He has ho COPD - if wheezing and short winded recommend he be evaluated today not wait til tomorrow - do we have availability to see him acutely today?

## 2021-05-29 NOTE — ED Provider Notes (Signed)
Charles Daniel    CSN: [id number] Arrival date & time: 05/29/21  1215      History   Chief Complaint Chief Complaint  Patient presents with   Shortness of Breath   Cough    HPI Charles Daniel is a 81 y.o. male.   The history is provided by the patient. No language interpreter was used.  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    by CXR, remote smoking history   History of colon cancer    2007 --  S/P RECTOSIGMOID COLECTOMY--  NO CHEMORADIATION--  NO RECURRENCE   History of CVA (cerebrovascular accident)    2003-  RIGHT MIDDLE CVA---   NO RESIDUAL   History of hepatitis B    REMOTE AND INACTIVE  PER DOCUMENTATION   History of syphilis    SECONDARY SYPHILITIS TX'D IN 1998  PER DOCUMENTATION   History of tuberculosis    LATENT TB  TX'D X12  MONTHS IN 1995   HIV infection ([place])    [place]--  MONITORED BY INFECTIOUS DISEASE (DR CAMPBELL)   Hypertension    Prostate carcinoma ([place]) dx 09/2012   T1c, brachytherapy/seed implant Charles Daniel)    Patient Active Problem List   Diagnosis Date Noted   Renal insufficiency 03/25/2020   Prediabetes 03/25/2020   Low HDL (under 40) 03/18/2020   Diminished pulses in lower extremity 09/04/2019   Tobacco use 05/16/2019   History of ischemic stroke 05/15/2019   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   TIA (transient ischemic attack) 11/18/2017   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   Advanced care planning/counseling discussion 74/12/1446   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    Prostate cancer ([place]) 11/08/2012   Medicare annual wellness visit, subsequent 08/02/2012   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   History of rectal cancer 04/22/2010   WEIGHT LOSS 12/18/2008   CONSTIPATION 03/16/2008   TUBERCULOSIS 05/15/2006   Human immunodeficiency virus (HIV) disease ([place]) 05/15/2006   SYPHILIS 05/15/2006   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   Essential hypertension 05/15/2006   History of stroke without residual deficits 05/15/2006   HEPATITIS B, HX OF 05/15/2006   GASTROINTESTINAL HEMORRHAGE, HX OF 05/15/2006    Past Surgical History:  Procedure Laterality Date   COLONOSCOPY  08/2011   3 polyps, diverticulosis, rec rpt 5 yrs Charles Daniel)   COLONOSCOPY  10/2016   7 TAs, diverticulosis, no rpt recommended (Charles Daniel)   INGUINAL HERNIA REPAIR  1994   UNILATERAL   LOW ANTERIOR RESECTION RECTOSIGMOID COLON  03-09-2006   PROSTATE BIOPSY  09/2012   53cc  (MD OFFICE)   RADIOACTIVE SEED IMPLANT N/A 01/20/2013   Procedure: RADIOACTIVE SEED IMPLANT;  Surgeon: Charles Jabs, MD;  as well as Charles Kidney MD   TRANSTHORACIC ECHOCARDIOGRAM  11-16-2001   LV WALL THICKNESS MODERATELY INCREASED/  EF 55-65%/ LVSF NORMAL       Home Medications    Prior to Admission medications   Medication Sig Start Date End Date Taking? Authorizing Provider  doxycycline (VIBRAMYCIN) 100 MG capsule Take 1 capsule (  100 mg total) by mouth 2 (two) times daily. 05/29/21  Yes Caryl Ada K, PA-C  predniSONE (DELTASONE) 50 MG tablet One tablet po once a day 05/29/21  Yes Threasa Alpha, Hollace Kinnier, PA-C  amLODipine (NORVASC) 5 MG tablet TAKE 1 TABLET BY MOUTH EVERY DAY 02/17/21   [name], MD  aspirin EC 81 MG tablet Take 1 tablet (81 mg total) by mouth daily. Swallow whole. 03/26/20   [name], MD  atorvastatin (LIPITOR) 40 MG tablet Take 1 tablet (40 mg total) by mouth daily. 04/30/21   [name], MD  azithromycin [facility]) 250 MG tablet Take two tablets on day  one followed by one tablet on days 2-5 Patient not taking: Reported on 12/17/2020 10/14/20   [name], MD  clopidogrel (PLAVIX) 75 MG tablet Take 1 tablet (75 mg total) by mouth daily. Patient not taking: Reported on 12/17/2020 03/26/20   [name], MD  clopidogrel (PLAVIX) 75 MG tablet Take 75 mg by mouth daily.    [provider]  GENVOYA 150-150-200-10 MG TABS tablet TAKE 1 TABLET BY MOUTH DAILY WITH BREAKFAST. 12/17/20 12/17/21  Michel Bickers, MD  lisinopril-hydrochlorothiazide (ZESTORETIC) 20-12.5 MG tablet TAKE 1 TABLET BY MOUTH EVERY DAY 05/18/21   [name], MD    Family History Family History  Problem Relation Age of Onset   Cancer Sister        breast   Breast cancer Sister    Cancer Brother 5       prostate, treated with seed implant   Cancer Brother        prostate   Cancer Brother        prostate   Cancer Daughter        breast   Cancer Other        prostate   Cancer Father        unsure   CAD Neg Hx    Stroke Neg Hx    Diabetes Neg Hx     Social History Social History   Tobacco Use   Smoking status: Former    Packs/day: 0.30    Years: 35.00    Pack years: 10.50    Types: Cigarettes    Quit date: 05/02/2020    Years since quitting: 1.0   Smokeless tobacco: Never  Vaping Use   Vaping Use: Never used  Substance Use Topics   Alcohol use: No    Alcohol/week: 0.0 standard drinks   Drug use: No     Allergies   Patient has no known allergies.   Review of Systems Review of Systems  Respiratory:  Positive for cough and shortness of breath.   All other systems reviewed and are negative.   Physical Exam Triage Vital Signs ED Triage Vitals  Enc Vitals Group     BP 05/29/21 1229 (!) 151/83     Pulse Rate 05/29/21 1229 94     Resp 05/29/21 1229 20     Temp 05/29/21 1229 99 F (37.2 C)     Temp Source 05/29/21 1229 Oral     SpO2 05/29/21 1229 95 %     Weight --      Height --      Head Circumference --      Peak  Flow --      Pain Score 05/29/21 1228 0     Pain Loc --      Pain Edu? --      Excl. in GC? --    No  data found.  Updated Vital Signs BP (!) 151/83 (BP Location: Left Arm)    Pulse 94    Temp 99 F (37.2 C) (Oral)    Resp 20    SpO2 95%   Visual Acuity Right Eye Distance:   Left Eye Distance:   Bilateral Distance:    Right Eye Near:   Left Eye Near:    Bilateral Near:     Physical Exam Vitals and nursing note reviewed.  Constitutional:      Appearance: He is well-developed.  HENT:     Head: Normocephalic.  Cardiovascular:     Rate and Rhythm: Normal rate and regular rhythm.  Pulmonary:     Effort: Pulmonary effort is normal.  Abdominal:     General: There is no distension.  Musculoskeletal:        General: Normal range of motion.     Cervical back: Normal range of motion.  Skin:    General: Skin is warm.  Neurological:     General: No focal deficit present.     Mental Status: He is alert and oriented to person, place, and time.  Psychiatric:        Mood and Affect: Mood normal.     UC Treatments / Results  Labs (all labs ordered are listed, but only abnormal results are displayed) Labs Reviewed - No data to display  EKG   Radiology DG Chest 2 View  Result Date: 05/29/2021 CLINICAL DATA:  Cough EXAM: CHEST - 2 VIEW COMPARISON:  05/15/2019 FINDINGS: The heart size and mediastinal contours are within normal limits. Hyperinflated lungs with mildly coarsened interstitial markings. No focal airspace consolidation, pleural effusion, or pneumothorax. The visualized skeletal structures are unremarkable. IMPRESSION: No active cardiopulmonary disease. Electronically Signed   By: Davina Poke D.O.   On: 05/29/2021 13:13    Procedures Procedures (including critical care time)  Medications Ordered in UC Medications  albuterol (VENTOLIN HFA) 108 (90 Base) MCG/ACT inhaler 4 puff (4 puffs Inhalation Given 05/29/21 1240)    Initial Impression / Assessment and Plan  / UC Course  I have reviewed the triage vital signs and the nursing notes.  Pertinent labs & imaging results that were available during my care of the patient were reviewed by me and considered in my medical decision making (see chart for details).     MDM:  Pt given 4 puffs of albuterol, breathing improved, chest xray shows hyperaeration.  Pt given inhaler, prednisone and doxycycline.   Final Clinical Impressions(s) / UC Diagnoses   Final diagnoses:  Acute bronchitis, unspecified organism     Discharge Instructions      Return if any problems. Use inhaler 2 puffs every 4 hours    ED Prescriptions     Medication Sig Dispense Auth. Provider   doxycycline (VIBRAMYCIN) 100 MG capsule Take 1 capsule (100 mg total) by mouth 2 (two) times daily. 20 capsule Tyra Michelle K, PA-C   predniSONE (DELTASONE) 50 MG tablet One tablet po once a day 6 tablet Fransico Meadow, [place]      PDMP not reviewed this encounter. An After Visit Summary was printed and given to the patient.    Fransico Meadow, [place] 05/29/21 1337

## 2021-05-29 NOTE — Telephone Encounter (Signed)
I spoke with pts wife (DPR signed) she is not sure when pts symptoms started and she is not sure when pt tested for covid. Pts wife said pt is in the shower. I asked if she could ask pt and she said no he was doing something personal. Pt's wife will have pt cb when available. Sending access note to Dr Darnell Level PCP, who is out of office and Eugenia Pancoast FNP and [facility] CMA.

## 2021-05-29 NOTE — Discharge Instructions (Addendum)
Return if any problems. Use inhaler 2 puffs every 4 hours

## 2021-05-29 NOTE — ED Triage Notes (Signed)
Pt c/o cough, chest congestion, SOB, sneezing, runny nose x 3 days.

## 2021-05-30 ENCOUNTER — Ambulatory Visit: Payer: Medicare Other | Admitting: Family

## 2021-05-30 ENCOUNTER — Other Ambulatory Visit (HOSPITAL_COMMUNITY): Payer: Self-pay

## 2021-05-30 NOTE — Telephone Encounter (Signed)
Patient went to UC and no showed his appointment with Tabitha this morning.

## 2021-05-30 NOTE — Telephone Encounter (Signed)
Thank you. He was seen yesterday at [facility] as per Lisa's note and treated for acute bronchitis.

## 2021-06-11 DIAGNOSIS — M5136 Other intervertebral disc degeneration, lumbar region: Secondary | ICD-10-CM | POA: Diagnosis not present

## 2021-06-11 DIAGNOSIS — R911 Solitary pulmonary nodule: Secondary | ICD-10-CM | POA: Diagnosis not present

## 2021-06-11 DIAGNOSIS — M25552 Pain in left hip: Secondary | ICD-10-CM | POA: Diagnosis not present

## 2021-06-11 DIAGNOSIS — J209 Acute bronchitis, unspecified: Secondary | ICD-10-CM | POA: Diagnosis not present

## 2021-06-11 DIAGNOSIS — M25511 Pain in right shoulder: Secondary | ICD-10-CM | POA: Diagnosis not present

## 2021-06-11 DIAGNOSIS — J019 Acute sinusitis, unspecified: Secondary | ICD-10-CM | POA: Diagnosis not present

## 2021-06-11 DIAGNOSIS — M545 Low back pain, unspecified: Secondary | ICD-10-CM | POA: Diagnosis not present

## 2021-06-11 DIAGNOSIS — R051 Acute cough: Secondary | ICD-10-CM | POA: Diagnosis not present

## 2021-06-11 DIAGNOSIS — R059 Cough, unspecified: Secondary | ICD-10-CM | POA: Diagnosis not present

## 2021-06-11 DIAGNOSIS — B9689 Other specified bacterial agents as the cause of diseases classified elsewhere: Secondary | ICD-10-CM | POA: Diagnosis not present

## 2021-06-11 DIAGNOSIS — J439 Emphysema, unspecified: Secondary | ICD-10-CM | POA: Diagnosis not present

## 2021-06-11 DIAGNOSIS — I6523 Occlusion and stenosis of bilateral carotid arteries: Secondary | ICD-10-CM | POA: Diagnosis not present

## 2021-06-19 ENCOUNTER — Other Ambulatory Visit (HOSPITAL_COMMUNITY): Payer: Self-pay

## 2021-06-23 ENCOUNTER — Other Ambulatory Visit (HOSPITAL_COMMUNITY): Payer: Self-pay

## 2021-06-25 ENCOUNTER — Other Ambulatory Visit (HOSPITAL_COMMUNITY): Payer: Self-pay

## 2021-07-04 ENCOUNTER — Other Ambulatory Visit (HOSPITAL_COMMUNITY): Payer: Self-pay

## 2021-07-04 DIAGNOSIS — M778 Other enthesopathies, not elsewhere classified: Secondary | ICD-10-CM | POA: Diagnosis not present

## 2021-07-04 DIAGNOSIS — M62838 Other muscle spasm: Secondary | ICD-10-CM | POA: Diagnosis not present

## 2021-07-04 DIAGNOSIS — M25511 Pain in right shoulder: Secondary | ICD-10-CM | POA: Diagnosis not present

## 2021-07-04 DIAGNOSIS — M7541 Impingement syndrome of right shoulder: Secondary | ICD-10-CM | POA: Diagnosis not present

## 2021-07-25 ENCOUNTER — Other Ambulatory Visit (HOSPITAL_COMMUNITY): Payer: Self-pay

## 2021-07-29 ENCOUNTER — Other Ambulatory Visit (HOSPITAL_COMMUNITY): Payer: Self-pay

## 2021-08-05 DIAGNOSIS — Z20822 Contact with and (suspected) exposure to covid-19: Secondary | ICD-10-CM | POA: Diagnosis not present

## 2021-08-11 ENCOUNTER — Other Ambulatory Visit: Payer: Self-pay | Admitting: Family Medicine

## 2021-08-19 ENCOUNTER — Encounter: Payer: Medicare Other | Admitting: Family Medicine

## 2021-08-19 ENCOUNTER — Other Ambulatory Visit (HOSPITAL_COMMUNITY): Payer: Self-pay

## 2021-08-27 ENCOUNTER — Other Ambulatory Visit (HOSPITAL_COMMUNITY): Payer: Self-pay

## 2021-09-02 DIAGNOSIS — Z20822 Contact with and (suspected) exposure to covid-19: Secondary | ICD-10-CM | POA: Diagnosis not present

## 2021-09-04 ENCOUNTER — Telehealth: Payer: Self-pay | Admitting: Family Medicine

## 2021-09-04 NOTE — Telephone Encounter (Signed)
Tried calling patient to schedule Medicare Annual Wellness Visit (AWV) either virtually or phone ? ?No answer ? ? ?Last AWV ;03/24/20 ? please schedule at anytime with health coach ? ? ?

## 2021-09-18 ENCOUNTER — Other Ambulatory Visit (HOSPITAL_COMMUNITY): Payer: Self-pay

## 2021-09-22 ENCOUNTER — Other Ambulatory Visit (HOSPITAL_COMMUNITY): Payer: Self-pay

## 2021-09-24 ENCOUNTER — Other Ambulatory Visit (HOSPITAL_COMMUNITY): Payer: Self-pay

## 2021-10-16 ENCOUNTER — Other Ambulatory Visit (HOSPITAL_COMMUNITY): Payer: Self-pay

## 2021-10-21 ENCOUNTER — Other Ambulatory Visit (HOSPITAL_COMMUNITY): Payer: Self-pay

## 2021-10-23 ENCOUNTER — Encounter: Payer: Self-pay | Admitting: Infectious Diseases

## 2021-11-11 ENCOUNTER — Encounter: Payer: Self-pay | Admitting: Family Medicine

## 2021-11-11 ENCOUNTER — Ambulatory Visit (INDEPENDENT_AMBULATORY_CARE_PROVIDER_SITE_OTHER): Payer: Medicare Other | Admitting: Family Medicine

## 2021-11-11 VITALS — BP 112/60 | HR 75 | Temp 98.1°F | Ht 65.5 in | Wt 133.2 lb

## 2021-11-11 DIAGNOSIS — J439 Emphysema, unspecified: Secondary | ICD-10-CM | POA: Diagnosis not present

## 2021-11-11 DIAGNOSIS — I10 Essential (primary) hypertension: Secondary | ICD-10-CM

## 2021-11-11 DIAGNOSIS — E786 Lipoprotein deficiency: Secondary | ICD-10-CM | POA: Diagnosis not present

## 2021-11-11 DIAGNOSIS — H9190 Unspecified hearing loss, unspecified ear: Secondary | ICD-10-CM | POA: Diagnosis not present

## 2021-11-11 DIAGNOSIS — Z8673 Personal history of transient ischemic attack (TIA), and cerebral infarction without residual deficits: Secondary | ICD-10-CM | POA: Diagnosis not present

## 2021-11-11 DIAGNOSIS — Z7189 Other specified counseling: Secondary | ICD-10-CM | POA: Diagnosis not present

## 2021-11-11 DIAGNOSIS — R7303 Prediabetes: Secondary | ICD-10-CM

## 2021-11-11 DIAGNOSIS — Z720 Tobacco use: Secondary | ICD-10-CM

## 2021-11-11 DIAGNOSIS — C61 Malignant neoplasm of prostate: Secondary | ICD-10-CM

## 2021-11-11 DIAGNOSIS — N1831 Chronic kidney disease, stage 3a: Secondary | ICD-10-CM | POA: Diagnosis not present

## 2021-11-11 DIAGNOSIS — B20 Human immunodeficiency virus [HIV] disease: Secondary | ICD-10-CM

## 2021-11-11 DIAGNOSIS — Z0000 Encounter for general adult medical examination without abnormal findings: Secondary | ICD-10-CM | POA: Diagnosis not present

## 2021-11-11 DIAGNOSIS — H547 Unspecified visual loss: Secondary | ICD-10-CM

## 2021-11-11 LAB — CBC WITH DIFFERENTIAL/PLATELET
Basophils Absolute: 0.1 10*3/uL (ref 0.0–0.1)
Basophils Relative: 1.3 % (ref 0.0–3.0)
Eosinophils Absolute: 0.5 10*3/uL (ref 0.0–0.7)
Eosinophils Relative: 7.2 % — ABNORMAL HIGH (ref 0.0–5.0)
HCT: 40.2 % (ref 39.0–52.0)
Hemoglobin: 13 g/dL (ref 13.0–17.0)
Lymphocytes Relative: 28.1 % (ref 12.0–46.0)
Lymphs Abs: 2 10*3/uL (ref 0.7–4.0)
MCHC: 32.3 g/dL (ref 30.0–36.0)
MCV: 88 fl (ref 78.0–100.0)
Monocytes Absolute: 0.6 10*3/uL (ref 0.1–1.0)
Monocytes Relative: 7.8 % (ref 3.0–12.0)
Neutro Abs: 4 10*3/uL (ref 1.4–7.7)
Neutrophils Relative %: 55.6 % (ref 43.0–77.0)
Platelets: 220 10*3/uL (ref 150.0–400.0)
RBC: 4.57 Mil/uL (ref 4.22–5.81)
RDW: 15.9 % — ABNORMAL HIGH (ref 11.5–15.5)
WBC: 7.2 10*3/uL (ref 4.0–10.5)

## 2021-11-11 LAB — COMPREHENSIVE METABOLIC PANEL
ALT: 17 U/L (ref 0–53)
AST: 17 U/L (ref 0–37)
Albumin: 3.9 g/dL (ref 3.5–5.2)
Alkaline Phosphatase: 72 U/L (ref 39–117)
BUN: 10 mg/dL (ref 6–23)
CO2: 32 mEq/L (ref 19–32)
Calcium: 9.5 mg/dL (ref 8.4–10.5)
Chloride: 102 mEq/L (ref 96–112)
Creatinine, Ser: 1.21 mg/dL (ref 0.40–1.50)
GFR: 56.12 mL/min — ABNORMAL LOW (ref >60.00)
Glucose, Bld: 82 mg/dL (ref 70–99)
Potassium: 4.2 mEq/L (ref 3.5–5.1)
Sodium: 141 mEq/L (ref 135–145)
Total Bilirubin: 0.3 mg/dL (ref 0.2–1.2)
Total Protein: 7 g/dL (ref 6.0–8.3)

## 2021-11-11 LAB — PSA: PSA: 26.94 ng/mL — ABNORMAL HIGH (ref 0.10–4.00)

## 2021-11-11 LAB — LIPID PANEL
Cholesterol: 140 mg/dL (ref 0–200)
HDL: 39.2 mg/dL (ref >39.00)
LDL Cholesterol: 75 mg/dL (ref 0–99)
NonHDL: 100.4
Total CHOL/HDL Ratio: 4
Triglycerides: 128 mg/dL (ref 0.0–149.0)
VLDL: 25.6 mg/dL (ref 0.0–40.0)

## 2021-11-11 LAB — HEMOGLOBIN A1C: Hgb A1c MFr Bld: 5.7 % (ref 4.6–6.5)

## 2021-11-11 LAB — VITAMIN D 25 HYDROXY (VIT D DEFICIENCY, FRACTURES): VITD: 21.79 ng/mL — ABNORMAL LOW (ref 30.00–100.00)

## 2021-11-11 NOTE — Assessment & Plan Note (Signed)
Update A1c ?

## 2021-11-11 NOTE — Assessment & Plan Note (Signed)
Few cig/day - encouraged full cessation.

## 2021-11-11 NOTE — Assessment & Plan Note (Signed)
Denies difficulty with this.

## 2021-11-11 NOTE — Progress Notes (Signed)
Patient ID: Charles Daniel, male    DOB: 07/27/1940, 81 y.o.   MRN: [id number]  This visit was conducted in person.  BP 112/60   Pulse 75   Temp 98.1 F (36.7 C) (Temporal)   Ht 5' 5.5" (1.664 m)   Wt 133 lb 4 oz (60.4 kg)   SpO2 95%   BMI 21.84 kg/m    CC: AMW Subjective:   HPI: Charles Daniel is a 81 y.o. male presenting on 11/11/2021 for Medicare Wellness   Did not see health advisor.   Hearing Screening   '500Hz'$  '1000Hz'$  '2000Hz'$  '4000Hz'$   Right ear 20 40 20 0  Left ear 20 40 0 0   Vision Screening   Right eye Left eye Both eyes  Without correction '20/50 20/40 20/50 '$  With correction       Flowsheet Row Office Visit from 11/11/2021 in [place] at [facility] Total Score 0     Denies difficulty with hearing.  Doesn't wear glasses.     11/11/2021   10:44 AM 12/17/2020    1:40 PM 03/25/2020    4:04 PM 12/18/2019    3:43 PM 03/13/2019    3:54 PM  Fall Risk   Falls in the past year? 0 0 0 0 0    HIV followed by Dr Megan Salon. Family not aware of dx.  H/o COPD - feels breathing is doing well.  Smoker - did not tolerate wellbutrin. He has self tapered - down to < 1 pack in the past 2 months! Did not use NRT.  H/o stroke 05/2019 - saw [facility] neurology Dr Melrose Nakayama last 06/2019 - continues plavix '75mg'$  daily and aspirin '81mg'$  daily along with atorvastatin.   Fasting today.    Preventative: COLONOSCOPY 10/2016 7 TAs, diverticulosis, no rpt recommended ([name])  H/o prostate cancer - sees [facility] yearly, now Dr Milford Cage, s/p radioactive seed implant 01/2013. Nocturia x1-2 - improvement. PSA levels trending up - had normal bone scan per pt - unsure when last seen.  Lung cancer screening - not eligible  Flu shot yearly COVID vaccine - Moderna 06/2019, 07/2019, booster 02/2020 Pneumovax - 09/2010 and 2007. Prevnar-13 01/2016 Td - 08/2012 Shingles shot - discussed.  Advanced directive discussion - states has this at home. Would want wife then grand daughter to be HCPOA. Doesn't  want prolonged life support if terminal condition. Ok for trial if he could improve.  Seat belt use discussed  Sunscreen use discussed. No changing moles on skin.  Ex smoker - quit 2010, again 2021, currently smoking <1 pp week Alcohol - none  Dentist - has dentures but uppers broke - seeing dentist next month.  Eye exam - no recent check - due Bowel - no constipation Bladder - no incontinence   Caffeine: 3 sodas/day Lives with wife. 2 grown sons. Occupation: retired from city of [employer], now works as custodian Radio broadcast assistant in [place]  Edu: 10th grade  Activity: walks at work  Diet: some water, fruits and vegetables regular      Relevant past medical, surgical, family and social history reviewed and updated as indicated. Interim medical history since our last visit reviewed. Allergies and medications reviewed and updated. Outpatient Medications Prior to Visit  Medication Sig Dispense Refill   amLODipine (NORVASC) 5 MG tablet TAKE 1 TABLET BY MOUTH EVERY DAY 90 tablet 0   atorvastatin (LIPITOR) 40 MG tablet Take 1 tablet (40 mg total) by mouth daily. 90 tablet 0   azithromycin (ZITHROMAX) 250  MG tablet Take two tablets on day one followed by one tablet on days 2-5 (Patient not taking: Reported on 12/17/2020) 6 each 0   clopidogrel (PLAVIX) 75 MG tablet Take 1 tablet (75 mg total) by mouth daily. (Patient not taking: Reported on 12/17/2020) 90 tablet 3   clopidogrel (PLAVIX) 75 MG tablet Take 75 mg by mouth daily.     doxycycline (VIBRAMYCIN) 100 MG capsule Take 1 capsule (100 mg total) by mouth 2 (two) times daily. 20 capsule 0   GENVOYA 150-150-200-10 MG TABS tablet TAKE 1 TABLET BY MOUTH DAILY WITH BREAKFAST. 30 tablet 11   lisinopril-hydrochlorothiazide (ZESTORETIC) 20-12.5 MG tablet TAKE 1 TABLET BY MOUTH EVERY DAY 90 tablet 0   predniSONE (DELTASONE) 50 MG tablet One tablet po once a day 6 tablet 0   aspirin EC 81 MG tablet Take 1 tablet (81 mg total) by mouth daily. Swallow  whole. 90 tablet 3   No facility-administered medications prior to visit.     Per HPI unless specifically indicated in ROS section below Review of Systems  Objective:  BP 112/60   Pulse 75   Temp 98.1 F (36.7 C) (Temporal)   Ht 5' 5.5" (1.664 m)   Wt 133 lb 4 oz (60.4 kg)   SpO2 95%   BMI 21.84 kg/m   Wt Readings from Last 3 Encounters:  11/11/21 133 lb 4 oz (60.4 kg)  12/17/20 136 lb (61.7 kg)  10/14/20 136 lb 9 oz (61.9 kg)      Physical Exam Vitals and nursing note reviewed.  Constitutional:      General: He is not in acute distress.    Appearance: Normal appearance. He is well-developed. He is not ill-appearing.  HENT:     Head: Normocephalic and atraumatic.     Right Ear: Hearing, tympanic membrane, ear canal and external ear normal.     Left Ear: Hearing, tympanic membrane, ear canal and external ear normal.  Eyes:     General: No scleral icterus.    Extraocular Movements: Extraocular movements intact.     Conjunctiva/sclera: Conjunctivae normal.     Pupils: Pupils are equal, round, and reactive to light.  Neck:     Thyroid: No thyroid mass or thyromegaly.     Vascular: No carotid bruit.  Cardiovascular:     Rate and Rhythm: Normal rate and regular rhythm.     Pulses: Normal pulses.          Radial pulses are 2+ on the right side and 2+ on the left side.     Heart sounds: Normal heart sounds. No murmur heard. Pulmonary:     Effort: Pulmonary effort is normal. No respiratory distress.     Breath sounds: Normal breath sounds. No wheezing, rhonchi or rales.  Abdominal:     General: Bowel sounds are normal. There is no distension.     Palpations: Abdomen is soft. There is no mass.     Tenderness: There is no abdominal tenderness. There is no guarding or rebound.     Hernia: No hernia is present.  Musculoskeletal:        General: Normal range of motion.     Cervical back: Normal range of motion and neck supple.     Right lower leg: No edema.     Left lower  leg: No edema.  Lymphadenopathy:     Cervical: No cervical adenopathy.  Skin:    General: Skin is warm and dry.     Findings: No  rash.  Neurological:     General: No focal deficit present.     Mental Status: He is alert and oriented to person, place, and time.     Comments:  Recall 0/3, 1/3 with cue Calculation 5/5 DLROW  Psychiatric:        Mood and Affect: Mood normal.        Behavior: Behavior normal.        Thought Content: Thought content normal.        Judgment: Judgment normal.        Assessment & Plan:   Problem List Items Addressed This Visit     Medicare annual wellness visit, subsequent - Primary (Chronic)    I have personally reviewed the Medicare Annual Wellness questionnaire and have noted 1. The patient's medical and social history 2. Their use of alcohol, tobacco or illicit drugs 3. Their current medications and supplements 4. The patient's functional ability including ADL's, fall risks, home safety risks and hearing or visual impairment. Cognitive function has been assessed and addressed as indicated.  5. Diet and physical activity 6. Evidence for depression or mood disorders The patients weight, height, BMI have been recorded in the chart. I have made referrals, counseling and provided education to the patient based on review of the above and I have provided the pt with a written personalized care plan for preventive services. Provider list updated.. See scanned questionairre as needed for further documentation. Reviewed preventative protocols and updated unless pt declined.       Advanced care planning/counseling discussion (Chronic)    Advanced directive discussion - states has this at home. Would want wife then grand daughter to be HCPOA. Doesn't want prolonged life support if terminal condition. Ok for trial if he could improve.       Human immunodeficiency virus (HIV) disease ([place])    Appreciate ID care.       Essential hypertension    Chronic,  stable. Update labwork today on amlodipine and zestoretic.       History of stroke without residual deficits    Continues aspirin and plavix. Has not seen neurology since 06/2019.       Prostate cancer [facility])    Update PSA. Has not recently seen uro. # provided to schedule appt.       Relevant Orders   PSA   CBC with Differential/Platelet   COPD with emphysema ([place])    Stable period off respiratory medication.       Tobacco use    Few cig/day - encouraged full cessation.       Low HDL (under 40)    Update FLP on atorvastatin.       Relevant Orders   Comprehensive metabolic panel   Lipid panel   CKD (chronic kidney disease) stage 3, GFR 30-59 ml/min (HCC)    Update labs.       Relevant Orders   VITAMIN D 25 Hydroxy (Vit-D Deficiency, Fractures)   CBC with Differential/Platelet   Parathyroid hormone, intact (no Ca)   Prediabetes    Update A1c.       Relevant Orders   Hemoglobin A1c   Hearing loss    Denies difficulty with this.       Vision impairment    Cataracts noted on exam.  Recommend schedule eye exam and for glaucoma screen - will refer to local optometrist that takes medicare.       Relevant Orders   Ambulatory referral to Optometry  No orders of the defined types were placed in this encounter.  Orders Placed This Encounter  Procedures   VITAMIN D 25 Hydroxy (Vit-D Deficiency, Fractures)   Comprehensive metabolic panel   Lipid panel   Hemoglobin A1c   PSA   CBC with Differential/Platelet   Parathyroid hormone, intact (no Ca)   Ambulatory referral to Optometry    Referral Priority:   Routine    Referral Type:   Vision Transport planner)    Referral Reason:   Specialty Services Required    Requested Specialty:   Optometry    Number of Visits Requested:   1    Patient instructions: Labs today  Continue working toward fully quitting smoking.  Schedule eye exam - see if you can find office that takes medicare. I will place referral to local  optometrist [facility]).  Call [facility] at [phone] to schedule follow up appointment for prostate cancer history.  If interested, check with pharmacy about new 2 shot shingles series (shingrix).  Bring me copy of advanced directive/living will to update your chart.   Follow up plan: Return in about 1 year (around 11/12/2022) for medicare wellness visit.  [name], MD

## 2021-11-11 NOTE — Assessment & Plan Note (Signed)
Update labs.  

## 2021-11-11 NOTE — Assessment & Plan Note (Signed)
Stable period off respiratory medication. 

## 2021-11-11 NOTE — Assessment & Plan Note (Signed)
Continues aspirin and plavix. Has not seen neurology since 06/2019.

## 2021-11-11 NOTE — Assessment & Plan Note (Signed)
Chronic, stable. Update labwork today on amlodipine and zestoretic.

## 2021-11-11 NOTE — Assessment & Plan Note (Signed)
Cataracts noted on exam.  Recommend schedule eye exam and for glaucoma screen - will refer to local optometrist that takes medicare.

## 2021-11-11 NOTE — Assessment & Plan Note (Signed)
Advanced directive discussion - states has this at home. Would want wife then grand daughter to be HCPOA. Doesn't want prolonged life support if terminal condition. Ok for trial if he could improve.

## 2021-11-11 NOTE — Assessment & Plan Note (Signed)

## 2021-11-11 NOTE — Assessment & Plan Note (Signed)
Update FLP on atorvastatin.

## 2021-11-11 NOTE — Assessment & Plan Note (Signed)
Appreciate ID care.

## 2021-11-11 NOTE — Assessment & Plan Note (Signed)
Update PSA. Has not recently seen uro. # provided to schedule appt.

## 2021-11-11 NOTE — Patient Instructions (Addendum)
Labs today  Continue working toward fully quitting smoking.  Schedule eye exam - see if you can find office that takes medicare. I will place referral to local optometrist [facility]).  Call [facility] at [phone] to schedule follow up appointment for prostate cancer history.  If interested, check with pharmacy about new 2 shot shingles series (shingrix).  Bring me copy of advanced directive/living will to update your chart.   Health Maintenance After Age 61 After age 26, you are at a higher risk for certain long-term diseases and infections as well as injuries from falls. Falls are a major cause of broken bones and head injuries in people who are older than age 88. Getting regular preventive care can help to keep you healthy and well. Preventive care includes getting regular testing and making lifestyle changes as recommended by your health care provider. Talk with your health care provider about: Which screenings and tests you should have. A screening is a test that checks for a disease when you have no symptoms. A diet and exercise plan that is right for you. What should I know about screenings and tests to prevent falls? Screening and testing are the best ways to find a health problem early. Early diagnosis and treatment give you the best chance of managing medical conditions that are common after age 39. Certain conditions and lifestyle choices may make you more likely to have a fall. Your health care provider may recommend: Regular vision checks. Poor vision and conditions such as cataracts can make you more likely to have a fall. If you wear glasses, make sure to get your prescription updated if your vision changes. Medicine review. Work with your health care provider to regularly review all of the medicines you are taking, including over-the-counter medicines. Ask your health care provider about any side effects that may make you more likely to have a fall. Tell your health care  provider if any medicines that you take make you feel dizzy or sleepy. Strength and balance checks. Your health care provider may recommend certain tests to check your strength and balance while standing, walking, or changing positions. Foot health exam. Foot pain and numbness, as well as not wearing proper footwear, can make you more likely to have a fall. Screenings, including: Osteoporosis screening. Osteoporosis is a condition that causes the bones to get weaker and break more easily. Blood pressure screening. Blood pressure changes and medicines to control blood pressure can make you feel dizzy. Depression screening. You may be more likely to have a fall if you have a fear of falling, feel depressed, or feel unable to do activities that you used to do. Alcohol use screening. Using too much alcohol can affect your balance and may make you more likely to have a fall. Follow these instructions at home: Lifestyle Do not drink alcohol if: Your health care provider tells you not to drink. If you drink alcohol: Limit how much you have to: 0-1 drink a day for women. 0-2 drinks a day for men. Know how much alcohol is in your drink. In the [place] one drink equals one 12 oz bottle of beer (355 mL), one 5 oz glass of wine (148 mL), or one 1 oz glass of hard liquor (44 mL). Do not use any products that contain nicotine or tobacco. These products include cigarettes, chewing tobacco, and vaping devices, such as e-cigarettes. If you need help quitting, ask your health care provider. Activity  Follow a regular exercise program to stay  fit. This will help you maintain your balance. Ask your health care provider what types of exercise are appropriate for you. If you need a cane or walker, use it as recommended by your health care provider. Wear supportive shoes that have nonskid soles. Safety  Remove any tripping hazards, such as rugs, cords, and clutter. Install safety equipment such as grab bars in  bathrooms and safety rails on stairs. Keep rooms and walkways well-lit. General instructions Talk with your health care provider about your risks for falling. Tell your health care provider if: You fall. Be sure to tell your health care provider about all falls, even ones that seem minor. You feel dizzy, tiredness (fatigue), or off-balance. Take over-the-counter and prescription medicines only as told by your health care provider. These include supplements. Eat a healthy diet and maintain a healthy weight. A healthy diet includes low-fat dairy products, low-fat (lean) meats, and fiber from whole grains, beans, and lots of fruits and vegetables. Stay current with your vaccines. Schedule regular health, dental, and eye exams. Summary Having a healthy lifestyle and getting preventive care can help to protect your health and wellness after age 45. Screening and testing are the best way to find a health problem early and help you avoid having a fall. Early diagnosis and treatment give you the best chance for managing medical conditions that are more common for people who are older than age 28. Falls are a major cause of broken bones and head injuries in people who are older than age 37. Take precautions to prevent a fall at home. Work with your health care provider to learn what changes you can make to improve your health and wellness and to prevent falls. This information is not intended to replace advice given to you by your health care provider. Make sure you discuss any questions you have with your health care provider. Document Revised: 09/09/2020 Document Reviewed: 09/09/2020 Elsevier Patient Education  [employer].

## 2021-11-12 ENCOUNTER — Other Ambulatory Visit (HOSPITAL_COMMUNITY): Payer: Self-pay

## 2021-11-12 LAB — PARATHYROID HORMONE, INTACT (NO CA): PTH: 61 pg/mL (ref 16–77)

## 2021-11-13 ENCOUNTER — Other Ambulatory Visit (HOSPITAL_COMMUNITY): Payer: Self-pay

## 2021-11-15 ENCOUNTER — Other Ambulatory Visit: Payer: Self-pay | Admitting: Family Medicine

## 2021-11-16 ENCOUNTER — Other Ambulatory Visit: Payer: Self-pay | Admitting: Family Medicine

## 2021-11-16 DIAGNOSIS — C61 Malignant neoplasm of prostate: Secondary | ICD-10-CM

## 2021-11-16 DIAGNOSIS — R9721 Rising PSA following treatment for malignant neoplasm of prostate: Secondary | ICD-10-CM | POA: Insufficient documentation

## 2021-11-16 MED ORDER — VITAMIN D3 25 MCG (1000 UT) PO CAPS: 1.0000 | ORAL_CAPSULE | Freq: Every day | ORAL | Status: DC

## 2021-12-02 ENCOUNTER — Other Ambulatory Visit: Payer: Medicare Other

## 2021-12-08 ENCOUNTER — Other Ambulatory Visit (HOSPITAL_COMMUNITY): Payer: Self-pay

## 2021-12-12 DIAGNOSIS — C61 Malignant neoplasm of prostate: Secondary | ICD-10-CM | POA: Diagnosis not present

## 2021-12-15 ENCOUNTER — Other Ambulatory Visit (HOSPITAL_COMMUNITY): Payer: Self-pay

## 2021-12-16 ENCOUNTER — Encounter: Payer: Medicare Other | Admitting: Internal Medicine

## 2021-12-17 ENCOUNTER — Other Ambulatory Visit: Payer: Self-pay | Admitting: Internal Medicine

## 2021-12-17 ENCOUNTER — Other Ambulatory Visit (HOSPITAL_COMMUNITY): Payer: Self-pay

## 2021-12-17 DIAGNOSIS — B20 Human immunodeficiency virus [HIV] disease: Secondary | ICD-10-CM

## 2021-12-17 MED ORDER — GENVOYA 150-150-200-10 MG PO TABS
1.0000 | ORAL_TABLET | Freq: Every day | ORAL | 3 refills | Status: DC
  Filled 2021-12-17: qty 30, 30d supply, fill #0
  Filled 2022-01-07: qty 30, 30d supply, fill #1
  Filled 2022-02-20: qty 30, 30d supply, fill #2
  Filled 2022-03-12: qty 30, 30d supply, fill #3

## 2021-12-17 NOTE — Telephone Encounter (Signed)
Called patient to see if he is taking atorvastatin still. Is prescribed as '40mg'$ , dose should not exceed '20mg'$  per day since patient is on Genvoya.   He will have to call back when he gets home.   Beryle Flock, RN

## 2021-12-17 NOTE — Telephone Encounter (Signed)
Patient returned call and states that he only takes 10 mg of Atorvastatin.

## 2022-01-07 ENCOUNTER — Other Ambulatory Visit (HOSPITAL_COMMUNITY): Payer: Self-pay

## 2022-01-09 ENCOUNTER — Other Ambulatory Visit (HOSPITAL_COMMUNITY): Payer: Self-pay

## 2022-02-04 ENCOUNTER — Other Ambulatory Visit (HOSPITAL_COMMUNITY): Payer: Self-pay

## 2022-02-09 ENCOUNTER — Other Ambulatory Visit (HOSPITAL_COMMUNITY): Payer: Self-pay

## 2022-02-11 DIAGNOSIS — C61 Malignant neoplasm of prostate: Secondary | ICD-10-CM | POA: Diagnosis not present

## 2022-02-11 DIAGNOSIS — R9721 Rising PSA following treatment for malignant neoplasm of prostate: Secondary | ICD-10-CM | POA: Diagnosis not present

## 2022-02-12 ENCOUNTER — Other Ambulatory Visit (HOSPITAL_COMMUNITY): Payer: Self-pay | Admitting: Urology

## 2022-02-12 DIAGNOSIS — C61 Malignant neoplasm of prostate: Secondary | ICD-10-CM

## 2022-02-20 ENCOUNTER — Encounter (HOSPITAL_COMMUNITY)
Admission: RE | Admit: 2022-02-20 | Discharge: 2022-02-20 | Disposition: A | Discharge status: Home / Self Care | Payer: Medicare Other | Source: Ambulatory Visit | Attending: Urology | Admitting: Urology

## 2022-02-20 ENCOUNTER — Other Ambulatory Visit (HOSPITAL_COMMUNITY): Payer: Self-pay

## 2022-02-20 DIAGNOSIS — C61 Malignant neoplasm of prostate: Secondary | ICD-10-CM | POA: Insufficient documentation

## 2022-02-20 DIAGNOSIS — C775 Secondary and unspecified malignant neoplasm of intrapelvic lymph nodes: Secondary | ICD-10-CM | POA: Diagnosis not present

## 2022-02-20 MED ORDER — PIFLIFOLASTAT F 18 (PYLARIFY) INJECTION
9.0000 | Freq: Once | INTRAVENOUS | Status: AC
  Administered 2022-02-20: 9.6 via INTRAVENOUS

## 2022-03-06 DIAGNOSIS — C61 Malignant neoplasm of prostate: Secondary | ICD-10-CM | POA: Diagnosis not present

## 2022-03-06 DIAGNOSIS — C778 Secondary and unspecified malignant neoplasm of lymph nodes of multiple regions: Secondary | ICD-10-CM | POA: Diagnosis not present

## 2022-03-12 ENCOUNTER — Other Ambulatory Visit (HOSPITAL_COMMUNITY): Payer: Self-pay

## 2022-03-12 DIAGNOSIS — C61 Malignant neoplasm of prostate: Secondary | ICD-10-CM | POA: Diagnosis not present

## 2022-03-12 DIAGNOSIS — Z5111 Encounter for antineoplastic chemotherapy: Secondary | ICD-10-CM | POA: Diagnosis not present

## 2022-03-17 DIAGNOSIS — L089 Local infection of the skin and subcutaneous tissue, unspecified: Secondary | ICD-10-CM | POA: Diagnosis not present

## 2022-03-18 ENCOUNTER — Other Ambulatory Visit: Payer: Medicare Other

## 2022-03-18 ENCOUNTER — Other Ambulatory Visit (HOSPITAL_COMMUNITY)
Admission: RE | Admit: 2022-03-18 | Discharge: 2022-03-18 | Disposition: A | Discharge status: Home / Self Care | Payer: Medicare Other | Source: Ambulatory Visit | Attending: Internal Medicine | Admitting: Internal Medicine

## 2022-03-18 ENCOUNTER — Other Ambulatory Visit: Payer: Self-pay

## 2022-03-18 DIAGNOSIS — Z113 Encounter for screening for infections with a predominantly sexual mode of transmission: Secondary | ICD-10-CM | POA: Insufficient documentation

## 2022-03-18 DIAGNOSIS — B20 Human immunodeficiency virus [HIV] disease: Secondary | ICD-10-CM

## 2022-03-18 DIAGNOSIS — Z79899 Other long term (current) drug therapy: Secondary | ICD-10-CM

## 2022-03-19 ENCOUNTER — Other Ambulatory Visit (HOSPITAL_COMMUNITY): Payer: Self-pay

## 2022-03-19 LAB — T-HELPER CELL (CD4) - (RCID CLINIC ONLY)
CD4 % Helper T Cell: 36 % (ref 33–65)
CD4 T Cell Abs: 631 /uL (ref 400–1790)

## 2022-03-19 LAB — URINE CYTOLOGY ANCILLARY ONLY
Chlamydia: NEGATIVE
Comment: NEGATIVE
Comment: NORMAL
Neisseria Gonorrhea: NEGATIVE

## 2022-03-20 ENCOUNTER — Other Ambulatory Visit (HOSPITAL_COMMUNITY): Payer: Self-pay

## 2022-03-21 LAB — CBC WITH DIFFERENTIAL/PLATELET
Absolute Monocytes: 489 cells/uL (ref 200–950)
Basophils Absolute: 80 cells/uL (ref 0–200)
Basophils Relative: 1.2 %
Eosinophils Absolute: 489 cells/uL (ref 15–500)
Eosinophils Relative: 7.3 %
HCT: 44.3 % (ref 38.5–50.0)
Hemoglobin: 14.5 g/dL (ref 13.2–17.1)
Lymphs Abs: 2003 cells/uL (ref 850–3900)
MCH: 28.7 pg (ref 27.0–33.0)
MCHC: 32.7 g/dL (ref 32.0–36.0)
MCV: 87.7 fL (ref 80.0–100.0)
MPV: 11.1 fL (ref 7.5–12.5)
Monocytes Relative: 7.3 %
Neutro Abs: 3638 cells/uL (ref 1500–7800)
Neutrophils Relative %: 54.3 %
Platelets: 256 10*3/uL (ref 140–400)
RBC: 5.05 10*6/uL (ref 4.20–5.80)
RDW: 14.4 % (ref 11.0–15.0)
Total Lymphocyte: 29.9 %
WBC: 6.7 10*3/uL (ref 3.8–10.8)

## 2022-03-21 LAB — COMPLETE METABOLIC PANEL WITH GFR
AG Ratio: 1.2 (calc) (ref 1.0–2.5)
ALT: 21 U/L (ref 9–46)
AST: 25 U/L (ref 10–35)
Albumin: 4.3 g/dL (ref 3.6–5.1)
Alkaline phosphatase (APISO): 78 U/L (ref 35–144)
BUN: 11 mg/dL (ref 7–25)
CO2: 30 mmol/L (ref 20–32)
Calcium: 9.8 mg/dL (ref 8.6–10.3)
Chloride: 101 mmol/L (ref 98–110)
Creat: 1.09 mg/dL (ref 0.70–1.22)
Globulin: 3.5 g/dL (calc) (ref 1.9–3.7)
Glucose, Bld: 95 mg/dL (ref 65–99)
Potassium: 4.1 mmol/L (ref 3.5–5.3)
Sodium: 138 mmol/L (ref 135–146)
Total Bilirubin: 0.4 mg/dL (ref 0.2–1.2)
Total Protein: 7.8 g/dL (ref 6.1–8.1)
eGFR: 68 mL/min/{1.73_m2} (ref >=60)

## 2022-03-21 LAB — FLUORESCENT TREPONEMAL AB(FTA)-IGG-BLD: Fluorescent Treponemal ABS: REACTIVE — AB

## 2022-03-21 LAB — RPR: RPR Ser Ql: REACTIVE — AB

## 2022-03-21 LAB — RPR TITER: RPR Titer: 1:2 {titer} — ABNORMAL HIGH

## 2022-03-21 LAB — HIV-1 RNA QUANT-NO REFLEX-BLD
HIV 1 RNA Quant: NOT DETECTED Copies/mL
HIV-1 RNA Quant, Log: NOT DETECTED Log cps/mL

## 2022-04-01 ENCOUNTER — Other Ambulatory Visit: Payer: Self-pay

## 2022-04-01 ENCOUNTER — Ambulatory Visit (INDEPENDENT_AMBULATORY_CARE_PROVIDER_SITE_OTHER): Payer: Medicare Other

## 2022-04-01 ENCOUNTER — Ambulatory Visit (INDEPENDENT_AMBULATORY_CARE_PROVIDER_SITE_OTHER): Payer: Medicare Other | Admitting: Internal Medicine

## 2022-04-01 ENCOUNTER — Encounter: Payer: Self-pay | Admitting: Internal Medicine

## 2022-04-01 VITALS — BP 133/80 | HR 79 | Wt 131.0 lb

## 2022-04-01 DIAGNOSIS — Z23 Encounter for immunization: Secondary | ICD-10-CM

## 2022-04-01 DIAGNOSIS — A539 Syphilis, unspecified: Secondary | ICD-10-CM

## 2022-04-01 DIAGNOSIS — B20 Human immunodeficiency virus [HIV] disease: Secondary | ICD-10-CM | POA: Diagnosis not present

## 2022-04-01 MED ORDER — GENVOYA 150-150-200-10 MG PO TABS: 1.0000 | ORAL_TABLET | Freq: Every day | ORAL | 11 refills | Status: DC

## 2022-04-01 NOTE — Progress Notes (Signed)
Patient Active Problem List   Diagnosis Date Noted   History of rectal cancer 04/22/2010    Priority: High   Human immunodeficiency virus (HIV) disease ([place]) 05/15/2006    Priority: High   Essential hypertension 05/15/2006    Priority: High   History of stroke without residual deficits 05/15/2006    Priority: High   Rising PSA following treatment for malignant neoplasm of prostate 11/16/2021   Hearing loss 11/11/2021   Vision impairment 11/11/2021   CKD (chronic kidney disease) stage 3, GFR 30-59 ml/min (HCC) 03/25/2020   Prediabetes 03/25/2020   Low HDL (under 40) 03/18/2020   Diminished pulses in lower extremity 09/04/2019   Tobacco use 05/16/2019   History of ischemic stroke 05/15/2019   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   TIA (transient ischemic attack) 11/18/2017   COPD with emphysema ([place]) 10/18/2015   Advanced care planning/counseling discussion 07/62/2633   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   Prostate cancer ([place]) 11/08/2012   Medicare annual wellness visit, subsequent 08/02/2012   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   AZITHROMYCIN (ZITHROMAX) 250 MG TABLET    Take two tablets on day one followed by one tablet on days 2-5   CHOLECALCIFEROL (VITAMIN D3) 25 MCG (1000 UT) CAPS    Take 1 capsule (1,000 Units total) by mouth daily.   CLOPIDOGREL (PLAVIX) 75 MG TABLET    Take 1 tablet (75 mg total) by mouth daily.   DOXYCYCLINE (VIBRAMYCIN) 100 MG CAPSULE    Take 1 capsule (100  mg total) by mouth 2 (two) times daily.   LISINOPRIL-HYDROCHLOROTHIAZIDE (ZESTORETIC) 20-12.5 MG TABLET    TAKE 1 TABLET BY MOUTH EVERY DAY   PREDNISONE (DELTASONE) 50 MG TABLET    One tablet po once a day  Modified Medications   Modified Medication Previous Medication   GENVOYA 150-150-200-10 MG TABS TABLET GENVOYA 150-150-200-10 MG TABS tablet      TAKE 1 TABLET BY MOUTH DAILY WITH BREAKFAST.    TAKE 1 TABLET BY MOUTH DAILY WITH BREAKFAST.  Discontinued Medications   No medications on file    Subjective: Charles Daniel is in for his routine HIV follow-up visit.  He denies any problems obtaining, taking or tolerating his Genvoya and says he never misses doses.  He takes it each morning with breakfast.  He is feeling well.  Review of Systems: Review of Systems  Constitutional:  Negative for fever and weight loss.  Psychiatric/Behavioral:  Negative for depression.     Past Medical History:  Diagnosis Date   Bilateral hydrocele 2012   Depression    Diverticulosis 2013   by colonoscopy   Emphysema lung ([place]) 03/2014    by CXR, remote smoking history   History of colon cancer    2007 --  S/P RECTOSIGMOID COLECTOMY--  NO CHEMORADIATION--  NO RECURRENCE   History of CVA (cerebrovascular accident)    2003-  RIGHT MIDDLE CVA---   NO RESIDUAL  History of hepatitis B    REMOTE AND INACTIVE  PER DOCUMENTATION   History of syphilis    SECONDARY SYPHILITIS TX'D IN 1998  PER DOCUMENTATION   History of tuberculosis    LATENT TB  TX'D X12  MONTHS IN 1995   HIV infection ([place])    [place]--  MONITORED BY INFECTIOUS DISEASE (DR Maudene Stotler)   Hypertension    Prostate carcinoma ([place]) dx 09/2012   T1c, brachytherapy/seed implant Karsten [facility], [place])    Social History   Tobacco Use   Smoking status: Former    Packs/day: 0.30    Years: 35.00    Total pack years: 10.50    Types: Cigarettes    Quit date: 05/02/2020    Years since quitting: 1.9   Smokeless tobacco: Never  Vaping Use   Vaping Use:  Never used  Substance Use Topics   Alcohol use: No    Alcohol/week: 0.0 standard drinks of alcohol   Drug use: No    Family History  Problem Relation Age of Onset   Cancer Sister        breast   Breast cancer Sister    Cancer Brother 44       prostate, treated with seed implant   Cancer Brother        prostate   Cancer Brother        prostate   Cancer Daughter        breast   Cancer Other        prostate   Cancer Father        unsure   CAD Neg Hx    Stroke Neg Hx    Diabetes Neg Hx     No Known Allergies  Health Maintenance  Topic Date Due   Zoster Vaccines- Shingrix (1 of 2) Never done   COVID-19 Vaccine (3 - Moderna risk series) 03/29/2020   INFLUENZA VACCINE  12/02/2021   Medicare Annual Wellness (AWV)  11/12/2022   Pneumonia Vaccine 84+ Years old  Completed   HPV VACCINES  Aged Out    Objective:  Vitals:   04/01/22 0839  Weight: 131 lb (59.4 kg)   Body mass index is 21.47 kg/m.  Physical Exam Constitutional:      Comments: He is calm and in good spirits as usual.  Cardiovascular:     Rate and Rhythm: Normal rate and regular rhythm.     Heart sounds: No murmur heard. Pulmonary:     Effort: Pulmonary effort is normal.     Breath sounds: Normal breath sounds.  Psychiatric:        Mood and Affect: Mood normal.     Lab Results Lab Results  Component Value Date   WBC 6.7 03/18/2022   HGB 14.5 03/18/2022   HCT 44.3 03/18/2022   MCV 87.7 03/18/2022   PLT 256 03/18/2022    Lab Results  Component Value Date   CREATININE 1.09 03/18/2022   BUN 11 03/18/2022   NA 138 03/18/2022   K 4.1 03/18/2022   CL 101 03/18/2022   CO2 30 03/18/2022    Lab Results  Component Value Date   ALT 21 03/18/2022   AST 25 03/18/2022   ALKPHOS 72 11/11/2021   BILITOT 0.4 03/18/2022    Lab Results  Component Value Date   CHOL 140 11/11/2021   HDL 39.20 11/11/2021   LDLCALC 75 11/11/2021   TRIG 128.0 11/11/2021   CHOLHDL 4 11/11/2021   Lab Results   Component Value  Date   LABRPR REACTIVE (A) 03/18/2022   RPRTITER 1:2 (H) 03/18/2022   HIV 1 RNA Quant (Copies/mL)  Date Value  03/18/2022 Not Detected  12/02/2020 Not Detected  12/05/2019 <20   CD4 T Cell Abs (/uL)  Date Value  03/18/2022 631  12/02/2020 975  12/05/2019 1,140     Problem List Items Addressed This Visit       High   Human immunodeficiency virus (HIV) disease ([place]) - Primary    His infection remains under excellent, long-term control.  He will continue Genvoya and follow-up after lab work in 1 year.  He received his influenza vaccine and updated COVID-vaccine here today.      Relevant Medications   GENVOYA 150-150-200-10 MG TABS tablet   Other Relevant Orders   CBC   T-helper cells (CD4) count (not at [facility])   Comprehensive metabolic panel   RPR   HIV-1 RNA quant-no reflex-bld     Unprioritized   SYPHILIS    His RPR is low, stable and serofast.  He does not need retreatment.      Relevant Medications   GENVOYA 150-150-200-10 MG TABS tablet      Michel Bickers, MD [facility] [place] [phone] pager   [phone] cell 04/01/2022, 8:51 AM

## 2022-04-01 NOTE — Assessment & Plan Note (Signed)
His RPR is low, stable and serofast.  He does not need retreatment.

## 2022-04-01 NOTE — Assessment & Plan Note (Signed)
His infection remains under excellent, long-term control.  He will continue Genvoya and follow-up after lab work in 1 year.  He received his influenza vaccine and updated COVID-vaccine here today.

## 2022-04-15 ENCOUNTER — Other Ambulatory Visit (HOSPITAL_COMMUNITY): Payer: Self-pay

## 2022-04-30 ENCOUNTER — Other Ambulatory Visit (HOSPITAL_COMMUNITY): Payer: Self-pay

## 2022-05-08 ENCOUNTER — Other Ambulatory Visit (HOSPITAL_COMMUNITY): Payer: Self-pay

## 2022-05-15 ENCOUNTER — Other Ambulatory Visit (HOSPITAL_COMMUNITY): Payer: Self-pay

## 2022-08-18 ENCOUNTER — Emergency Department: Payer: Medicare Other

## 2022-08-18 ENCOUNTER — Other Ambulatory Visit: Payer: Self-pay

## 2022-08-18 ENCOUNTER — Inpatient Hospital Stay
Admission: EM | Admit: 2022-08-18 | Discharge: 2022-08-22 | DRG: 974 | Disposition: A | Discharge status: Home Health | Payer: Medicare Other | Attending: Internal Medicine | Admitting: Internal Medicine

## 2022-08-18 DIAGNOSIS — J439 Emphysema, unspecified: Secondary | ICD-10-CM | POA: Diagnosis present

## 2022-08-18 DIAGNOSIS — A419 Sepsis, unspecified organism: Secondary | ICD-10-CM | POA: Diagnosis present

## 2022-08-18 DIAGNOSIS — R0602 Shortness of breath: Secondary | ICD-10-CM | POA: Diagnosis not present

## 2022-08-18 DIAGNOSIS — Z8546 Personal history of malignant neoplasm of prostate: Secondary | ICD-10-CM

## 2022-08-18 DIAGNOSIS — Z597 Insufficient social insurance and welfare support: Secondary | ICD-10-CM

## 2022-08-18 DIAGNOSIS — R6520 Severe sepsis without septic shock: Secondary | ICD-10-CM | POA: Diagnosis present

## 2022-08-18 DIAGNOSIS — Z8615 Personal history of latent tuberculosis infection: Secondary | ICD-10-CM

## 2022-08-18 DIAGNOSIS — J441 Chronic obstructive pulmonary disease with (acute) exacerbation: Secondary | ICD-10-CM | POA: Diagnosis present

## 2022-08-18 DIAGNOSIS — Z8673 Personal history of transient ischemic attack (TIA), and cerebral infarction without residual deficits: Secondary | ICD-10-CM

## 2022-08-18 DIAGNOSIS — G4733 Obstructive sleep apnea (adult) (pediatric): Secondary | ICD-10-CM | POA: Diagnosis present

## 2022-08-18 DIAGNOSIS — A539 Syphilis, unspecified: Secondary | ICD-10-CM | POA: Diagnosis present

## 2022-08-18 DIAGNOSIS — N183 Chronic kidney disease, stage 3 unspecified: Secondary | ICD-10-CM | POA: Diagnosis present

## 2022-08-18 DIAGNOSIS — Z85038 Personal history of other malignant neoplasm of large intestine: Secondary | ICD-10-CM

## 2022-08-18 DIAGNOSIS — Z87891 Personal history of nicotine dependence: Secondary | ICD-10-CM

## 2022-08-18 DIAGNOSIS — J209 Acute bronchitis, unspecified: Secondary | ICD-10-CM | POA: Diagnosis present

## 2022-08-18 DIAGNOSIS — Z7902 Long term (current) use of antithrombotics/antiplatelets: Secondary | ICD-10-CM

## 2022-08-18 DIAGNOSIS — R42 Dizziness and giddiness: Secondary | ICD-10-CM | POA: Diagnosis not present

## 2022-08-18 DIAGNOSIS — E8721 Acute metabolic acidosis: Secondary | ICD-10-CM | POA: Diagnosis present

## 2022-08-18 DIAGNOSIS — J9601 Acute respiratory failure with hypoxia: Secondary | ICD-10-CM | POA: Diagnosis present

## 2022-08-18 DIAGNOSIS — J9621 Acute and chronic respiratory failure with hypoxia: Secondary | ICD-10-CM | POA: Diagnosis present

## 2022-08-18 DIAGNOSIS — I10 Essential (primary) hypertension: Secondary | ICD-10-CM | POA: Diagnosis present

## 2022-08-18 DIAGNOSIS — Z1152 Encounter for screening for COVID-19: Secondary | ICD-10-CM

## 2022-08-18 DIAGNOSIS — J189 Pneumonia, unspecified organism: Secondary | ICD-10-CM | POA: Diagnosis present

## 2022-08-18 DIAGNOSIS — G9341 Metabolic encephalopathy: Secondary | ICD-10-CM | POA: Diagnosis present

## 2022-08-18 DIAGNOSIS — N179 Acute kidney failure, unspecified: Secondary | ICD-10-CM | POA: Diagnosis present

## 2022-08-18 DIAGNOSIS — Z79899 Other long term (current) drug therapy: Secondary | ICD-10-CM

## 2022-08-18 DIAGNOSIS — Z720 Tobacco use: Secondary | ICD-10-CM | POA: Diagnosis present

## 2022-08-18 DIAGNOSIS — Z9049 Acquired absence of other specified parts of digestive tract: Secondary | ICD-10-CM

## 2022-08-18 DIAGNOSIS — J440 Chronic obstructive pulmonary disease with acute lower respiratory infection: Secondary | ICD-10-CM | POA: Diagnosis present

## 2022-08-18 DIAGNOSIS — B20 Human immunodeficiency virus [HIV] disease: Secondary | ICD-10-CM | POA: Diagnosis present

## 2022-08-18 DIAGNOSIS — I129 Hypertensive chronic kidney disease with stage 1 through stage 4 chronic kidney disease, or unspecified chronic kidney disease: Secondary | ICD-10-CM | POA: Diagnosis present

## 2022-08-18 LAB — BASIC METABOLIC PANEL
Anion gap: 11 (ref 5–15)
BUN: 15 mg/dL (ref 8–23)
CO2: 22 mmol/L (ref 22–32)
Calcium: 9.2 mg/dL (ref 8.9–10.3)
Chloride: 102 mmol/L (ref 98–111)
Creatinine, Ser: 1.49 mg/dL — ABNORMAL HIGH (ref 0.61–1.24)
GFR, Estimated: 47 mL/min — ABNORMAL LOW (ref >60)
Glucose, Bld: 114 mg/dL — ABNORMAL HIGH (ref 70–99)
Potassium: 4.4 mmol/L (ref 3.5–5.1)
Sodium: 135 mmol/L (ref 135–145)

## 2022-08-18 LAB — BLOOD GAS, ARTERIAL
Acid-base deficit: 1.4 mmol/L (ref 0.0–2.0)
Bicarbonate: 22.7 mmol/L (ref 20.0–28.0)
O2 Saturation: 95.7 %
Patient temperature: 37
pCO2 arterial: 35 mmHg (ref 32–48)
pH, Arterial: 7.42 (ref 7.35–7.45)
pO2, Arterial: 71 mmHg — ABNORMAL LOW (ref 83–108)

## 2022-08-18 LAB — HEPATIC FUNCTION PANEL
ALT: 27 U/L (ref 0–44)
AST: 53 U/L — ABNORMAL HIGH (ref 15–41)
Albumin: 3.9 g/dL (ref 3.5–5.0)
Alkaline Phosphatase: 84 U/L (ref 38–126)
Bilirubin, Direct: 0.1 mg/dL (ref 0.0–0.2)
Indirect Bilirubin: 0.9 mg/dL (ref 0.3–0.9)
Total Bilirubin: 1 mg/dL (ref 0.3–1.2)
Total Protein: 7.8 g/dL (ref 6.5–8.1)

## 2022-08-18 LAB — D-DIMER, QUANTITATIVE: D-Dimer, Quant: >20 ug/mL-FEU — ABNORMAL HIGH (ref 0.00–0.50)

## 2022-08-18 LAB — LIPASE, BLOOD: Lipase: 22 U/L (ref 11–51)

## 2022-08-18 LAB — CBC
HCT: 41.9 % (ref 39.0–52.0)
Hemoglobin: 13.5 g/dL (ref 13.0–17.0)
MCH: 28.5 pg (ref 26.0–34.0)
MCHC: 32.2 g/dL (ref 30.0–36.0)
MCV: 88.6 fL (ref 80.0–100.0)
Platelets: 182 10*3/uL (ref 150–400)
RBC: 4.73 MIL/uL (ref 4.22–5.81)
RDW: 14.3 % (ref 11.5–15.5)
WBC: 20 10*3/uL — ABNORMAL HIGH (ref 4.0–10.5)
nRBC: 0 % (ref 0.0–0.2)

## 2022-08-18 LAB — LACTIC ACID, PLASMA: Lactic Acid, Venous: 4.4 mmol/L (ref 0.5–1.9)

## 2022-08-18 LAB — TROPONIN I (HIGH SENSITIVITY): Troponin I (High Sensitivity): 15 ng/L (ref <18)

## 2022-08-18 LAB — PROCALCITONIN: Procalcitonin: 0.65 ng/mL

## 2022-08-18 LAB — BRAIN NATRIURETIC PEPTIDE: B Natriuretic Peptide: 28.2 pg/mL (ref 0.0–100.0)

## 2022-08-18 MED ORDER — DOXYCYCLINE HYCLATE 100 MG PO TABS
100.0000 mg | ORAL_TABLET | Freq: Once | ORAL | Status: DC
  Filled 2022-08-18: qty 1

## 2022-08-18 MED ORDER — AMOXICILLIN-POT CLAVULANATE 875-125 MG PO TABS
1.0000 | ORAL_TABLET | Freq: Once | ORAL | Status: DC
  Filled 2022-08-18: qty 1

## 2022-08-18 MED ORDER — ALBUTEROL SULFATE (2.5 MG/3ML) 0.083% IN NEBU
5.0000 mg | INHALATION_SOLUTION | Freq: Once | RESPIRATORY_TRACT | Status: AC
  Administered 2022-08-18: 5 mg via RESPIRATORY_TRACT
  Filled 2022-08-18: qty 6

## 2022-08-18 MED ORDER — KETOROLAC TROMETHAMINE 15 MG/ML IJ SOLN
7.5000 mg | Freq: Once | INTRAMUSCULAR | Status: AC
  Administered 2022-08-18: 7.5 mg via INTRAVENOUS
  Filled 2022-08-18: qty 1

## 2022-08-18 MED ORDER — PANTOPRAZOLE SODIUM 40 MG IV SOLR
40.0000 mg | Freq: Two times a day (BID) | INTRAVENOUS | Status: DC
  Administered 2022-08-19: 40 mg via INTRAVENOUS
  Filled 2022-08-18 (×2): qty 10

## 2022-08-18 MED ORDER — SODIUM CHLORIDE 0.9 % IV BOLUS
1000.0000 mL | Freq: Once | INTRAVENOUS | Status: AC
  Administered 2022-08-18: 1000 mL via INTRAVENOUS

## 2022-08-18 MED ORDER — IOHEXOL 350 MG/ML SOLN
75.0000 mL | Freq: Once | INTRAVENOUS | Status: AC | PRN
  Administered 2022-08-18: 75 mL via INTRAVENOUS

## 2022-08-18 MED ORDER — METHYLPREDNISOLONE SODIUM SUCC 125 MG IJ SOLR
125.0000 mg | INTRAMUSCULAR | Status: AC
  Administered 2022-08-18: 125 mg via INTRAVENOUS
  Filled 2022-08-18: qty 2

## 2022-08-18 MED ORDER — SODIUM CHLORIDE 0.9 % IV SOLN
INTRAVENOUS | Status: AC
  Administered 2022-08-18: 2 g via INTRAVENOUS
  Filled 2022-08-18: qty 20

## 2022-08-18 MED ORDER — SODIUM CHLORIDE 0.9 % IV SOLN
2.0000 g | Freq: Once | INTRAVENOUS | Status: AC
  Administered 2022-08-18: 2 g via INTRAVENOUS

## 2022-08-18 MED ORDER — IPRATROPIUM-ALBUTEROL 0.5-2.5 (3) MG/3ML IN SOLN
RESPIRATORY_TRACT | Status: AC
  Filled 2022-08-18: qty 3

## 2022-08-18 MED ORDER — IPRATROPIUM-ALBUTEROL 0.5-2.5 (3) MG/3ML IN SOLN
3.0000 mL | Freq: Once | RESPIRATORY_TRACT | Status: AC
  Administered 2022-08-18: 3 mL via RESPIRATORY_TRACT
  Filled 2022-08-18: qty 3

## 2022-08-18 MED ORDER — LACTATED RINGERS IV BOLUS
1000.0000 mL | Freq: Once | INTRAVENOUS | Status: AC
  Administered 2022-08-18: 1000 mL via INTRAVENOUS

## 2022-08-18 MED ORDER — SODIUM CHLORIDE 0.9 % IV SOLN
500.0000 mg | Freq: Once | INTRAVENOUS | Status: AC
  Administered 2022-08-18: 500 mg via INTRAVENOUS

## 2022-08-18 MED ORDER — IPRATROPIUM-ALBUTEROL 0.5-2.5 (3) MG/3ML IN SOLN
3.0000 mL | Freq: Once | RESPIRATORY_TRACT | Status: AC
  Administered 2022-08-18: 3 mL via RESPIRATORY_TRACT

## 2022-08-18 MED ORDER — ACETAMINOPHEN 500 MG PO TABS
1000.0000 mg | ORAL_TABLET | Freq: Once | ORAL | Status: DC
  Filled 2022-08-18: qty 2

## 2022-08-18 NOTE — ED Provider Triage Note (Signed)
Emergency Medicine Provider Triage Evaluation Note  Charles Daniel , a 82 y.o. male  was evaluated in triage.  Pt complains of shortness of breath since last night. 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 Denies chest pain, fever, cough.  Physical Exam  BP (!) 158/92   Pulse (!) 101   Temp 98.4 F (36.9 C)   Resp (!) 27   Wt 61.2 kg   SpO2 94%   BMI 22.12 kg/m  Gen:   Awake, no distress   Resp:  Normal effort Diffuse wheezing and decreased air movement. MSK:   Moves extremities without difficulty  Other:    Medical Decision Making  Medically screening exam initiated at 4:20 PM.  Appropriate orders placed.  Charles Daniel was informed that the remainder of the evaluation will be completed by another provider, this initial triage assessment does not replace that evaluation, and the importance of remaining in the ED until their evaluation is complete.  Neb treatment and protocols ordered.   Chinita Pester, FNP 08/18/22 1622

## 2022-08-18 NOTE — ED Triage Notes (Signed)
Pt to ED [facility] for shob since last night. Labored breathing noted. Unable to speak in complete sentences. Mucous membranes dry.

## 2022-08-18 NOTE — H&P (Incomplete)
History and Physical     Patient: Farmer Mccahill QQV:[id number] DOB: 03/30/1941 DOA: 08/18/2022 DOS: the patient was seen and examined on 08/19/2022 PCP: [name], MD   Patient coming from: Home  Chief Complaint: SOB.  HISTORY OF PRESENT ILLNESS: Maximillion Gill is an 82 y.o. male  seen in ed with c/o SOB.  Pt is poor historian and is not able to give clear symptoms and history.  Initial presentation patient had labored breathing and was unable to Speak in complete sentences.  Per ED note patient shortness of breath started 4 days. Past Medical History:  Diagnosis Date   Bilateral hydrocele 2012   Depression    Diverticulosis 2013   by colonoscopy   Emphysema lung (HCC) 03/2014    by CXR, remote smoking history   History of colon cancer    2007 --  S/P RECTOSIGMOID COLECTOMY--  NO CHEMORADIATION--  NO RECURRENCE   History of CVA (cerebrovascular accident)    2003-  RIGHT MIDDLE CVA---   NO RESIDUAL   History of hepatitis B    REMOTE AND INACTIVE  PER DOCUMENTATION   History of syphilis    SECONDARY SYPHILITIS TX'D IN 1998  PER DOCUMENTATION   History of tuberculosis    LATENT TB  TX'D X12  MONTHS IN 1995   HIV infection (HCC)    DX 1995--  MONITORED BY INFECTIOUS DISEASE (DR CAMPBELL)   Hypertension    Prostate carcinoma (HCC) dx 09/2012   T1c, brachytherapy/seed implant Vernie Ammons, [place])   Review of Systems  Unable to perform ROS: Acuity of condition  Neurological:  Positive for dizziness.   No Known Allergies Past Surgical History:  Procedure Laterality Date   COLONOSCOPY  08/2011   3 polyps, diverticulosis, rec rpt 5 yrs Arlyce Dice)   COLONOSCOPY  10/2016   7 TAs, diverticulosis, no rpt recommended ([name])   INGUINAL HERNIA REPAIR  1994   UNILATERAL   LOW ANTERIOR RESECTION RECTOSIGMOID COLON  03-09-2006   PROSTATE BIOPSY  09/2012   53cc  (MD OFFICE)   RADIOACTIVE SEED IMPLANT N/A 01/20/2013   Procedure: RADIOACTIVE SEED IMPLANT;  Surgeon: Garnett Farm,  MD;  as well as Rose Phi MD   TRANSTHORACIC ECHOCARDIOGRAM  11-16-2001   LV WALL THICKNESS MODERATELY INCREASED/  EF 55-65%/ LVSF NORMAL   MEDICATIONS: Prior to Admission medications   Medication Sig Start Date End Date Taking? Authorizing Provider  amLODipine (NORVASC) 5 MG tablet TAKE 1 TABLET BY MOUTH EVERY DAY 11/17/21   [name], MD  atorvastatin (LIPITOR) 40 MG tablet Take 1 tablet (40 mg total) by mouth daily. 04/30/21   [name], MD  azithromycin [facility]) 250 MG tablet Take two tablets on day one followed by one tablet on days 2-5 Patient not taking: Reported on 12/17/2020 10/14/20   [name], MD  Cholecalciferol (VITAMIN D3) 25 MCG (1000 UT) CAPS Take 1 capsule (1,000 Units total) by mouth daily. 11/16/21   [name], MD  clopidogrel (PLAVIX) 75 MG tablet Take 1 tablet (75 mg total) by mouth daily. Patient not taking: Reported on 12/17/2020 03/26/20   [name], MD  doxycycline (VIBRAMYCIN) 100 MG capsule Take 1 capsule (100 mg total) by mouth 2 (two) times daily. Patient not taking: Reported on 04/01/2022 05/29/21   Elson Areas, PA-C  GENVOYA 150-150-200-10 MG TABS tablet TAKE 1 TABLET BY MOUTH DAILY WITH BREAKFAST. 04/01/22 04/01/23  Cliffton Asters, MD  lisinopril-hydrochlorothiazide (ZESTORETIC) 20-12.5 MG tablet TAKE 1 TABLET BY MOUTH EVERY DAY 11/17/21  [name], MD  predniSONE (DELTASONE) 50 MG tablet One tablet po once a day Patient not taking: Reported on 04/01/2022 05/29/21   Elson Areas, PA-C    amLODipine  5 mg Oral Daily   atorvastatin  40 mg Oral Daily   cholecalciferol  1,000 Units Oral Daily   heparin  5,000 Units Subcutaneous Q8H   methylPREDNISolone (SOLU-MEDROL) injection  40 mg Intravenous Q12H   pantoprazole (PROTONIX) IV  40 mg Intravenous Q12H   sodium chloride flush  3 mL Intravenous Q12H     azithromycin (ZITHROMAX) 500 mg in sodium chloride 0.9 % 250 mL IVPB     cefTRIAXone (ROCEPHIN)  IV      lactated ringers     sodium chloride 0.9 % with cefTRIAXone (ROCEPHIN) ADS Med     ED Course: Pt in Ed dyspneic awake with O2 sats of 98% on room air.  Patient meets severe sepsis criteria with white count.  Respiratory infection lactic acidosis. Vitals:   08/18/22 1900 08/18/22 2000 08/18/22 2100 08/19/22 0105  BP: (!) 176/94 (!) 181/90 (!) 197/103 (!) 191/99  Pulse:    99  Resp: (!) 32 (!) 25 (!) 38 (!) 24  Temp:      SpO2:    98%  Weight:       Total I/O In: 2350 [IV Piggyback:2350] Out: -  SpO2: 98 % Blood work in ed shows: Glucose 114 creatinine of 1.49 , mildly elevated AST of 53. Lactic acid of 4.4 with a repeat of 5.8. CBC shows leukocytosis of 20,000.  D-dimer > 20.   Results for orders placed or performed during the hospital encounter of 08/18/22 (from the past 72 hour(s))  CBC     Status: Abnormal   Collection Time: 08/18/22  4:21 PM  Result Value Ref Range   WBC 20.0 (H) 4.0 - 10.5 K/uL   RBC 4.73 4.22 - 5.81 MIL/uL   Hemoglobin 13.5 13.0 - 17.0 g/dL   HCT 16.1 09.6 - 04.5 %   MCV 88.6 80.0 - 100.0 fL   MCH 28.5 26.0 - 34.0 pg   MCHC 32.2 30.0 - 36.0 g/dL   RDW 40.9 81.1 - 91.4 %   Platelets 182 150 - 400 K/uL   nRBC 0.0 0.0 - 0.2 %    Comment: Performed at [facility], [address], [place], [place] [address]  Basic metabolic panel     Status: Abnormal   Collection Time: 08/18/22  4:21 PM  Result Value Ref Range   Sodium 135 135 - 145 mmol/L   Potassium 4.4 3.5 - 5.1 mmol/L   Chloride 102 98 - 111 mmol/L   CO2 22 22 - 32 mmol/L   Glucose, Bld 114 (H) 70 - 99 mg/dL    Comment: Glucose reference range applies only to samples taken after fasting for at least 8 hours.   BUN 15 8 - 23 mg/dL   Creatinine, Ser 6.21 (H) 0.61 - 1.24 mg/dL   Calcium 9.2 8.9 - 30.8 mg/dL   GFR, Estimated 47 (L) >60 mL/min    Comment: (NOTE) Calculated using the CKD-EPI Creatinine Equation (2021)    Anion gap 11 5 - 15    Comment: Performed at [facility], [address] [place], [place] [address]  D-dimer, quantitative     Status: Abnormal   Collection Time: 08/18/22  4:21 PM  Result Value Ref Range   D-Dimer, Quant >20.00 (H) 0.00 - 0.50 ug/mL-FEU    Comment: (  NOTE) At the manufacturer cut-off value of 0.5 g/mL FEU, this assay has a negative predictive value of 95-100%.This assay is intended for use in conjunction with a clinical pretest probability (PTP) assessment model to exclude pulmonary embolism (PE) and deep venous thrombosis (DVT) in outpatients suspected of PE or DVT. Results should be correlated with clinical presentation. Performed at [facility], [address] [place], [place] [address]   Lipase, blood     Status: None   Collection Time: 08/18/22  4:21 PM  Result Value Ref Range   Lipase 22 11 - 51 U/L    Comment: Performed at [facility], [address] [place], [place] [address]  Hepatic function panel     Status: Abnormal   Collection Time: 08/18/22  4:21 PM  Result Value Ref Range   Total Protein 7.8 6.5 - 8.1 g/dL   Albumin 3.9 3.5 - 5.0 g/dL   AST 53 (H) 15 - 41 U/L   ALT 27 0 - 44 U/L   Alkaline Phosphatase 84 38 - 126 U/L   Total Bilirubin 1.0 0.3 - 1.2 mg/dL   Bilirubin, Direct 0.1 0.0 - 0.2 mg/dL   Indirect Bilirubin 0.9 0.3 - 0.9 mg/dL    Comment: Performed at [facility], [address] [place], [place] [address]  Troponin I (High Sensitivity)     Status: None   Collection Time: 08/18/22  4:21 PM  Result Value Ref Range   Troponin I (High Sensitivity) 15 <18 ng/L    Comment: (NOTE) Elevated high sensitivity troponin I (hsTnI) values and significant  changes across serial measurements may suggest ACS but many other  chronic and acute conditions are known to elevate hsTnI results.  Refer to the "Links" section for chest pain algorithms and additional  guidance. Performed at [facility], [address] [place], [place] [address]   Procalcitonin      Status: None   Collection Time: 08/18/22  4:21 PM  Result Value Ref Range   Procalcitonin 0.65 ng/mL    Comment:        Interpretation: PCT > 0.5 ng/mL and <= 2 ng/mL: Systemic infection (sepsis) is possible, but other conditions are known to elevate PCT as well. (NOTE)       Sepsis PCT Algorithm           Lower Respiratory Tract                                      Infection PCT Algorithm    ----------------------------     ----------------------------         PCT < 0.25 ng/mL                PCT < 0.10 ng/mL          Strongly encourage             Strongly discourage   discontinuation of antibiotics    initiation of antibiotics    ----------------------------     -----------------------------       PCT 0.25 - 0.50 ng/mL            PCT 0.10 - 0.25 ng/mL               OR       >80% decrease in PCT            Discourage initiation of  antibiotics      Encourage discontinuation           of antibiotics    ----------------------------     -----------------------------         PCT >= 0.50 ng/mL              PCT 0.26 - 0.50 ng/mL                AND       <80% decrease in PCT             Encourage initiation of                                             antibiotics       Encourage continuation           of antibiotics    ----------------------------     -----------------------------        PCT >= 0.50 ng/mL                  PCT > 0.50 ng/mL               AND         increase in PCT                  Strongly encourage                                      initiation of antibiotics    Strongly encourage escalation           of antibiotics                                     -----------------------------                                           PCT <= 0.25 ng/mL                                                 OR                                        > 80% decrease in PCT                                      Discontinue / Do not  initiate                                             antibiotics  Performed at [facility], [address], [place], [place] [address]   Brain natriuretic peptide     Status: None   Collection  Time: 08/18/22  4:21 PM  Result Value Ref Range   B Natriuretic Peptide 28.2 0.0 - 100.0 pg/mL    Comment: Performed at [facility], [address] [place], [place] [address]  Lactic acid, plasma     Status: Abnormal   Collection Time: 08/18/22  9:05 PM  Result Value Ref Range   Lactic Acid, Venous 4.4 (HH) 0.5 - 1.9 mmol/L    Comment: CRITICAL RESULT CALLED TO, READ BACK BY AND VERIFIED WITH Christean Leaf  08/18/22 RH Performed at [facility], [address] [place], [place] [address]   Blood gas, arterial     Status: Abnormal   Collection Time: 08/18/22 11:11 PM  Result Value Ref Range   Delivery systems ROOM AIR    pH, Arterial 7.42 7.35 - 7.45   pCO2 arterial 35 32 - 48 mmHg   pO2, Arterial 71 (L) 83 - 108 mmHg   Bicarbonate 22.7 20.0 - 28.0 mmol/L   Acid-base deficit 1.4 0.0 - 2.0 mmol/L   O2 Saturation 95.7 %   Patient temperature 37.0    Collection site LEFT RADIAL    Allens test (pass/fail) PASS PASS    Comment: Performed at [facility], [address] [place], [place] [address]  Lactic acid, plasma     Status: Abnormal   Collection Time: 08/18/22 11:35 PM  Result Value Ref Range   Lactic Acid, Venous 5.8 (HH) 0.5 - 1.9 mmol/L    Comment: CRITICAL VALUE NOTED. VALUE IS CONSISTENT WITH PREVIOUSLY REPORTED/CALLED VALUE RH Performed at [facility], [address] [place], [place] [address]   Lactate dehydrogenase     Status: Abnormal   Collection Time: 08/19/22 12:35 AM  Result Value Ref Range   LDH 431 (H) 98 - 192 U/L    Comment: Performed at [facility], [address] [place], [place] [address]    Lab Results  Component Value Date   CREATININE 1.49 (H) 08/18/2022   CREATININE 1.09 03/18/2022    CREATININE 1.21 11/11/2021      Latest Ref Rng & Units 08/18/2022    4:21 PM 03/18/2022    8:29 AM 11/11/2021   11:38 AM  CMP  Glucose 70 - 99 mg/dL 696  95  82   BUN 8 - 23 mg/dL 15  11  10    Creatinine 0.61 - 1.24 mg/dL 2.95  2.84  1.32   Sodium 135 - 145 mmol/L 135  138  141   Potassium 3.5 - 5.1 mmol/L 4.4  4.1  4.2   Chloride 98 - 111 mmol/L 102  101  102   CO2 22 - 32 mmol/L 22  30  32   Calcium 8.9 - 10.3 mg/dL 9.2  9.8  9.5   Total Protein 6.5 - 8.1 g/dL 7.8  7.8  7.0   Total Bilirubin 0.3 - 1.2 mg/dL 1.0  0.4  0.3   Alkaline Phos 38 - 126 U/L 84   72   AST 15 - 41 U/L 53  25  17   ALT 0 - 44 U/L 27  21  17     Unresulted Labs (From admission, onward)     Start     Ordered   08/19/22 0500  CBC with Differential/Platelet  Tomorrow morning,   R        08/19/22 0217   08/19/22 0500  Comprehensive metabolic panel  Tomorrow morning,   R        08/19/22 0217  08/19/22 0500  Magnesium  Tomorrow morning,   R        08/19/22 0217   08/19/22 0500  Phosphorus  Tomorrow morning,   R        08/19/22 0217   08/19/22 0218  RPR  Add-on,   AD        08/19/22 0217   08/19/22 0120  Type and screen  Once,   STAT        08/19/22 0120   08/19/22 0119  Urine Drug Screen, Qualitative ([facility] only)  Add-on,   AD        08/19/22 0120   08/19/22 0119  T4, free  Once,   URGENT        08/19/22 0120   08/19/22 0118  TSH  Once,   URGENT        08/19/22 0120   08/19/22 0009  Expectorated Sputum Assessment w Gram Stain, Rflx to Resp Cult  Once,   URGENT       Question:  Patient immune status  Answer:  Immunocompromised   08/19/22 0012   08/19/22 0005  Culture, blood (Routine X 2) w Reflex to ID Panel  BLOOD CULTURE X 2,   STAT      08/19/22 0004   08/19/22 0005  Urinalysis, w/ Reflex to Culture (Infection Suspected) -Urine, Clean Catch  (Urine Labs)  Once,   URGENT       Question:  Specimen Source  Answer:  Urine, Clean Catch   08/19/22 0004   08/19/22 0004  Helper T-Lymph-CD4 ([facility] only)   Once,   URGENT        08/19/22 0003           Pt has received : Orders Placed This Encounter  Procedures   Culture, blood (Routine X 2) w Reflex to ID Panel    Standing Status:   Standing    Number of Occurrences:   2   Expectorated Sputum Assessment w Gram Stain, Rflx to Resp Cult    Standing Status:   Standing    Number of Occurrences:   1    Order Specific Question:   Patient immune status    Answer:   Immunocompromised   DG Chest 2 View    Standing Status:   Standing    Number of Occurrences:   1    Order Specific Question:   Reason for Exam (SYMPTOM  OR DIAGNOSIS REQUIRED)    Answer:   shob   CT Angio Chest PE W and/or Wo Contrast    Standing Status:   Standing    Number of Occurrences:   1    Order Specific Question:   Does the patient have a contrast media/X-ray dye allergy?    Answer:   No    Order Specific Question:   If indicated for the ordered procedure, I authorize the administration of contrast media per Radiology protocol    Answer:   Yes    Order Specific Question:   Radiology Contrast Protocol - do NOT remove file path    Answer:   [email or web address]   CT Head Wo Contrast    Standing Status:   Standing    Number of Occurrences:   1   MR BRAIN WO CONTRAST    Standing Status:   Standing    Number of Occurrences:   1    Order Specific Question:   What is the patient's sedation requirement?    Answer:   No Sedation  Order Specific Question:   Does the patient have a pacemaker or implanted devices?    Answer:   No    Order Specific Question:   Radiology Contrast Protocol - do NOT remove file path    Answer:   [email or web address]   CT ABDOMEN PELVIS WO CONTRAST    Standing Status:   Standing    Number of Occurrences:   1    Order Specific Question:   Is Oral Contrast requested for this exam?    Answer:   Yes, Per Radiology protocol    Order Specific Question:   Does the patient  have a contrast media/X-ray dye allergy?    Answer:   No   CBC    Standing Status:   Standing    Number of Occurrences:   1   Basic metabolic panel    Standing Status:   Standing    Number of Occurrences:   1   D-dimer, quantitative    Standing Status:   Standing    Number of Occurrences:   1   Lipase, blood    Standing Status:   Standing    Number of Occurrences:   1   Hepatic function panel    Standing Status:   Standing    Number of Occurrences:   1   Lactic acid, plasma    Standing Status:   Standing    Number of Occurrences:   2   Procalcitonin    Standing Status:   Standing    Number of Occurrences:   1   Blood gas, arterial    Standing Status:   Standing    Number of Occurrences:   1   Lactate dehydrogenase    Standing Status:   Standing    Number of Occurrences:   1   Brain natriuretic peptide    Standing Status:   Standing    Number of Occurrences:   1   Helper T-Lymph-CD4 ([facility] only)    Standing Status:   Standing    Number of Occurrences:   1   Urinalysis, w/ Reflex to Culture (Infection Suspected) -Urine, Clean Catch    Standing Status:   Standing    Number of Occurrences:   1    Order Specific Question:   Specimen Source    Answer:   Urine, Clean Catch [76]   TSH    Standing Status:   Standing    Number of Occurrences:   1   Urine Drug Screen, Qualitative ([facility] only)    Standing Status:   Standing    Number of Occurrences:   1   T4, free    Standing Status:   Standing    Number of Occurrences:   1   CBC with Differential/Platelet    Standing Status:   Standing    Number of Occurrences:   1   Comprehensive metabolic panel    Standing Status:   Standing    Number of Occurrences:   1   Magnesium    Standing Status:   Standing    Number of Occurrences:   1   Phosphorus    Standing Status:   Standing    Number of Occurrences:   1   RPR    Standing Status:   Standing    Number of Occurrences:   1   Diet NPO time specified Except for: Sips with  Meds, Ice Chips    Standing Status:   Standing    Number  of Occurrences:   1    Order Specific Question:   Except for    Answer:   Sips with Meds    Order Specific Question:   Except for    Answer:   Ice Chips   Maintain IV access    Standing Status:   Standing    Number of Occurrences:   1   Vital signs    Standing Status:   Standing    Number of Occurrences:   1   Notify physician (specify)    Standing Status:   Standing    Number of Occurrences:   20    Order Specific Question:   Notify Physician    Answer:   for pulse less than 55 or greater than 120    Order Specific Question:   Notify Physician    Answer:   for respiratory rate less than 12 or greater than 25    Order Specific Question:   Notify Physician    Answer:   for temperature greater than 100.5 F    Order Specific Question:   Notify Physician    Answer:   for urinary output less than 30 mL/hr for four hours    Order Specific Question:   Notify Physician    Answer:   for systolic BP less than 90 or greater than 160, diastolic BP less than 60 or greater than 100    Order Specific Question:   Notify Physician    Answer:   for new hypoxia w/ oxygen saturations < 88%   Progressive Mobility Protocol: No Restrictions    Standing Status:   Standing    Number of Occurrences:   1   Daily weights    Standing Status:   Standing    Number of Occurrences:   1   Intake and Output    Standing Status:   Standing    Number of Occurrences:   1   Do not place and if present remove PureWick    Standing Status:   Standing    Number of Occurrences:   1   Initiate Oral Care Protocol    Standing Status:   Standing    Number of Occurrences:   1   Initiate Carrier Fluid Protocol    Standing Status:   Standing    Number of Occurrences:   1   RN may order General Admission PRN Orders utilizing "General Admission PRN medications" (through manage orders) for the following patient needs: allergy symptoms (Claritin), cold sores (Carmex),  cough (Robitussin DM), eye irritation (Liquifilm Tears), hemorrhoids (Tucks), indigestion (Maalox), minor skin irritation (Hydrocortisone Cream), muscle pain (Ben Gay), nose irritation (saline nasal spray) and sore throat (Chloraseptic spray).    Standing Status:   Standing    Number of Occurrences:   [phone]   Cardiac Monitoring - Continuous Indefinite    Standing Status:   Standing    Number of Occurrences:   1    Order Specific Question:   Indications for use:    Answer:   ICU/Stepdown patient   Full code    Standing Status:   Standing    Number of Occurrences:   1    Order Specific Question:   By:    Answer:   Other   Consult to hospitalist    Standing Status:   Standing    Number of Occurrences:   1    Order Specific Question:   Place call to:    Answer:  ED, [phone]    Order Specific Question:   Reason for Consult    Answer:   Admit    Order Specific Question:   Diagnosis/Clinical Info for Consult:    Answer:   pna, weakness   Consult to infectious diseases Consult Timeframe: ROUTINE - requires response within 24 hours; Reason for Consult? SOB/ PNA / HIV/ Syphilis/ TB    Standing Status:   Standing    Number of Occurrences:   1    Order Specific Question:   Consult Timeframe    Answer:   ROUTINE - requires response within 24 hours    Order Specific Question:   Reason for Consult?    Answer:   SOB/ PNA / HIV/ Syphilis/ TB   Consult to respiratory care treatment    Standing Status:   Standing    Number of Occurrences:   1    Order Specific Question:   Reason for Consult?    Answer:   hypoxia/ dyspnea/ PNA/ Oxygen need o2 71 % on ABG.   Pulse oximetry check with vital signs    Standing Status:   Standing    Number of Occurrences:   1   Oxygen therapy Mode or (Route): Nasal cannula; Liters Per Minute: 2; Keep 02 saturation: greater than 92 %    Standing Status:   Standing    Number of Occurrences:   20    Order Specific Question:   Mode or (Route)    Answer:   Nasal cannula     Order Specific Question:   Liters Per Minute    Answer:   2    Order Specific Question:   Keep 02 saturation    Answer:   greater than 92 %   Chest physiotherapy    Standing Status:   Standing    Number of Occurrences:   1   ED EKG    Standing Status:   Standing    Number of Occurrences:   1    Order Specific Question:   Reason for Exam    Answer:   Chest Pain   Type and screen    Standing Status:   Standing    Number of Occurrences:   1   Admit to Inpatient (patient's expected length of stay will be greater than 2 midnights or inpatient only procedure)    Standing Status:   Standing    Number of Occurrences:   1    Order Specific Question:   Hospital Area    Answer:   [facility] [[id number]]    Order Specific Question:   Level of Care    Answer:   Stepdown [14]    Order Specific Question:   Covid Evaluation    Answer:   Asymptomatic - no recent exposure (last 10 days) testing not required    Order Specific Question:   Diagnosis    Answer:   SOB (shortness of breath) [147829]    Order Specific Question:   Admitting Physician    Answer:   Darrold Junker    Order Specific Question:   Attending Physician    Answer:   Darrold Junker    Order Specific Question:   Certification:    Answer:   I certify this patient will need inpatient services for at least 2 midnights    Order Specific Question:   Estimated Length of Stay    Answer:   5   Aspiration precautions  Standing Status:   Standing    Number of Occurrences:   1   Fall precautions    Standing Status:   Standing    Number of Occurrences:   1    Meds ordered this encounter  Medications   ipratropium-albuterol (DUONEB) 0.5-2.5 (3) MG/3ML nebulizer solution 3 mL   ipratropium-albuterol (DUONEB) 0.5-2.5 (3) MG/3ML nebulizer solution 3 mL   ipratropium-albuterol (DUONEB) 0.5-2.5 (3) MG/3ML nebulizer solution    Eden Lathe B: cabinet override   lactated ringers bolus 1,000 mL    ketorolac (TORADOL) 15 MG/ML injection 7.5 mg   iohexol (OMNIPAQUE) 350 MG/ML injection 75 mL   methylPREDNISolone sodium succinate (SOLU-MEDROL) 125 mg/2 mL injection 125 mg   albuterol (PROVENTIL) (2.5 MG/3ML) 0.083% nebulizer solution 5 mg   DISCONTD: amoxicillin-clavulanate (AUGMENTIN) 875-125 MG per tablet 1 tablet   DISCONTD: acetaminophen (TYLENOL) tablet 1,000 mg   DISCONTD: doxycycline (VIBRA-TABS) tablet 100 mg   sodium chloride 0.9 % bolus 1,000 mL   cefTRIAXone (ROCEPHIN) 2 g in sodium chloride 0.9 % 100 mL IVPB    Order Specific Question:   Antibiotic Indication:    Answer:   CAP   azithromycin (ZITHROMAX) 500 mg in sodium chloride 0.9 % 250 mL IVPB   sodium chloride 0.9 % with cefTRIAXone (ROCEPHIN) ADS Med    Barney Drain: cabinet override   pantoprazole (PROTONIX) injection 40 mg   DISCONTD: hydrALAZINE (APRESOLINE) injection 10 mg   amLODipine (NORVASC) tablet 5 mg   atorvastatin (LIPITOR) tablet 40 mg   cholecalciferol (VITAMIN D3) tablet 1,000 Units   heparin injection 5,000 Units   sodium chloride flush (NS) 0.9 % injection 3 mL   OR Linked Order Group    acetaminophen (TYLENOL) tablet 650 mg    acetaminophen (TYLENOL) suppository 650 mg   DISCONTD: lactated ringers infusion   hydrALAZINE (APRESOLINE) injection 5 mg   cefTRIAXone (ROCEPHIN) 2 g in sodium chloride 0.9 % 100 mL IVPB    Order Specific Question:   Antibiotic Indication:    Answer:   CAP   azithromycin (ZITHROMAX) 500 mg in sodium chloride 0.9 % 250 mL IVPB   ipratropium-albuterol (DUONEB) 0.5-2.5 (3) MG/3ML nebulizer solution 3 mL   lactated ringers infusion   methylPREDNISolone sodium succinate (SOLU-MEDROL) 40 mg/mL injection 40 mg    Admission Imaging : MR BRAIN WO CONTRAST  Result Date: 08/19/2022 CLINICAL DATA:  Altered mental status EXAM: MRI HEAD WITHOUT CONTRAST TECHNIQUE: Multiplanar, multiecho pulse sequences of the brain and surrounding structures were obtained without  intravenous contrast. COMPARISON:  None Available. FINDINGS: The brain is partially obscured by susceptibility artifacts related to metallic foreign body. There is also patient motion. Brain: No acute infarct, mass effect or extra-axial collection. Chronic microhemorrhage in the right cerebellum. There is confluent hyperintense T2-weighted signal within the white matter. Generalized volume loss. The midline structures are normal. Vascular: Major flow voids are preserved. Skull and upper cervical spine: Normal calvarium and skull base. Visualized upper cervical spine and soft tissues are normal. Sinuses/Orbits:No paranasal sinus fluid levels or advanced mucosal thickening. No mastoid or middle ear effusion. Normal orbits. IMPRESSION: 1. No acute intracranial abnormality. 2. Findings of chronic small vessel ischemia and volume loss. Electronically Signed   By: Deatra Robinson M.D.   On: 08/19/2022 00:56   CT Head Wo Contrast  Result Date: 08/18/2022 CLINICAL DATA:  Mental status change, unknown cause EXAM: CT HEAD WITHOUT CONTRAST TECHNIQUE: Contiguous axial images were obtained from the base of the skull through the vertex  without intravenous contrast. RADIATION DOSE REDUCTION: This exam was performed according to the departmental dose-optimization program which includes automated exposure control, adjustment of the mA and/or kV according to patient size and/or use of iterative reconstruction technique. COMPARISON:  MRI head 05/15/2019, CT head 05/15/2019, PET CT 02/20/2022 FINDINGS: Brain: Patchy and confluent areas of decreased attenuation are noted throughout the deep and periventricular white matter of the cerebral hemispheres bilaterally, compatible with chronic microvascular ischemic disease. Redemonstration of right frontotemporal encephalomalacia. No evidence of large-territorial acute infarction. No parenchymal hemorrhage. No mass lesion. No extra-axial collection. No mass effect or midline shift. No  hydrocephalus. Basilar cisterns are patent. Vascular: No hyperdense vessel. Skull: No acute fracture or focal lesion. Similar-appearing metallic density along the left scalp. Sinuses/Orbits: Sphenoid, ethmoid, maxillary sinus mucosal thickening. Paranasal sinuses and mastoid air cells are clear. The orbits are unremarkable. Other: None. IMPRESSION: No acute intracranial abnormality. Electronically Signed   By: Tish Frederickson M.D.   On: 08/18/2022 21:43   CT Angio Chest PE W and/or Wo Contrast  Result Date: 08/18/2022 CLINICAL DATA:  Shortness of breath since last night. History of COPD. PE suspected EXAM: CT ANGIOGRAPHY CHEST WITH CONTRAST TECHNIQUE: Multidetector CT imaging of the chest was performed using the standard protocol during bolus administration of intravenous contrast. Multiplanar CT image reconstructions and MIPs were obtained to evaluate the vascular anatomy. RADIATION DOSE REDUCTION: This exam was performed according to the departmental dose-optimization program which includes automated exposure control, adjustment of the mA and/or kV according to patient size and/or use of iterative reconstruction technique. CONTRAST:  75mL OMNIPAQUE IOHEXOL 350 MG/ML SOLN COMPARISON:  Radiographs 08/18/2022 and report from CT chest 08/18/2007 FINDINGS: Cardiovascular: Satisfactory opacification of the pulmonary arteries to the segmental level. No pulmonary embolism. No evidence of acute aortic syndrome. Coronary artery and aortic atherosclerotic calcification. No pericardial effusion. Mediastinum/Nodes: Small hiatal hernia. Esophagus is unremarkable. 1.2 cm right hilar node (4/64). Otherwise no thoracic adenopathy. Lungs/Pleura: Bibasilar scarring/atelectasis. Within the posterolateral left lower lobe there is an area of more nodular consolidation measuring 2.0 x 1.0 cm (5/104). Mild bronchial wall thickening and mucous plugging greatest in the lower lobes. Mild right apical scarring. No pleural effusion or  pneumothorax. Upper Abdomen: No acute abnormality. Musculoskeletal: Age-indeterminate compression deformities of T4, T8, T11, L1, and L2. These may be chronic however there is not a good recent comparison to confirm chronicity. No rib fractures. Review of the MIP images confirms the above findings. IMPRESSION: 1. No acute pulmonary embolism. 2. Mild bronchial wall thickening and mucous plugging greatest in the lower lobes. 3. Within the posterolateral left lower lobe there is an area of nodular consolidation measuring 2.0 x 1.0 cm. This may be related to chronic scarring however follow-up is recommended to ensure stability. Consider one of the following in 3 months for both low-risk and high-risk individuals: (a) repeat chest CT, (b) follow-up PET-CT, or (c) tissue sampling. This recommendation follows the consensus statement: Guidelines for Management of Incidental Pulmonary Nodules Detected on CT Images: From the Fleischner Society 2017; Radiology 2017; 284:228-243. 4. Age-indeterminate compression deformities of T4, T8, T11, L1, and L2. These may be chronic however there is not a good recent comparison to confirm chronicity. Correlate with areas of pain and consider dedicated workup when clinically appropriate. Aortic Atherosclerosis (ICD10-I70.0). Electronically Signed   By: Minerva Fester M.D.   On: 08/18/2022 19:46   DG Chest 2 View  Result Date: 08/18/2022 CLINICAL DATA:  Shortness of breath EXAM: CHEST - 2 VIEW COMPARISON:  X-ray 05/29/2021 FINDINGS: Underinflation. No consolidation, pneumothorax or effusion. No edema. Normal cardiopericardial silhouette with calcified and tortuous aorta. There are some compression deformities of the mid and lower thoracic spine on the lateral view which are new from 2023. Etiology is uncertain. Please correlate for any known history or dedicated workup when appropriate. Patient does have a history of prostate cancer. IMPRESSION: Underinflation.  No consolidation. There  is slight compression of mid and lower thoracic spine vertebral levels which was not seen on x-ray of January 2023. etiology is uncertain. Dedicated workup when clinically appropriate Electronically Signed   By: Karen Kays M.D.   On: 08/18/2022 17:29    Physical Examination: Vitals:   08/18/22 1900 08/18/22 2000 08/18/22 2100 08/19/22 0105  BP: (!) 176/94 (!) 181/90 (!) 197/103 (!) 191/99  Pulse:    99  Temp:      Resp: (!) 32 (!) 25 (!) 38 (!) 24  Weight:      SpO2:    98%   Physical Exam Vitals and nursing note reviewed.  Constitutional:      General: He is not in acute distress.    Appearance: He is ill-appearing. He is not toxic-appearing or diaphoretic.  HENT:     Head: Normocephalic and atraumatic.     Right Ear: Hearing and external ear normal.     Left Ear: Hearing and external ear normal.     Nose: Nose normal. No nasal deformity.     Mouth/Throat:     Lips: Pink.     Mouth: Mucous membranes are moist.     Tongue: No lesions.     Pharynx: Oropharynx is clear.  Eyes:     Extraocular Movements: Extraocular movements intact.     Pupils: Pupils are equal, round, and reactive to light.  Cardiovascular:     Rate and Rhythm: Regular rhythm. Tachycardia present.     Heart sounds: Normal heart sounds.  Pulmonary:     Effort: Pulmonary effort is normal.     Breath sounds: Wheezing and rhonchi present.  Abdominal:     General: Bowel sounds are normal. There is no distension.     Palpations: Abdomen is soft. There is no mass.     Tenderness: There is no abdominal tenderness. There is no guarding.     Hernia: No hernia is present.  Musculoskeletal:     Right lower leg: No edema.     Left lower leg: No edema.  Skin:    General: Skin is warm.  Neurological:     General: No focal deficit present.     Mental Status: He is alert and oriented to person, place, and time.     Cranial Nerves: Cranial nerves 2-12 are intact.     Motor: Motor function is intact.  Psychiatric:         Attention and Perception: Attention normal.        Mood and Affect: Mood normal.        Speech: Speech normal.        Behavior: Behavior normal. Behavior is cooperative.        Cognition and Memory: Cognition normal.     Assessment and Plan: * SOB (shortness of breath) Pt presenting with SOB and found to be hypoxic with PNA and severe sepsis. We will admit to stepdown unit.  Cont duoneb. Cont iv abx from ed: Rocephin and azithromycin. Sputum culture.  ID consult.  SpO2: 98 % On RA but hypoxia noted on ABG. Pulmonary consult  for eval of Within the posterolateral left lower lobe there is an area of nodular consolidation measuring 2.0 x 1.0 cm.   Acute lactic acidosis Suspect from type b due to ART meds  We will monitor and provide supportive care.  ABG shows normal PH.  We will start pt on LR which also may help the acidosis although no proven treatment except for stopping the meds.   08/18/22 21:05 08/18/22 23:35  Lactic Acid, Venous 4.4 (HH) 5.8 (HH)  high  Severe sepsis Pt meets severe sepsis criteria. We will cont iv abx. Supportive care / Follow C/S.  Culture not collected in Ed were collected after iv abx.  Vitals:   08/18/22 1618 08/18/22 1800 08/18/22 1900 08/18/22 2000  BP: (!) 158/92 (!) 162/97 (!) 176/94 (!) 181/90   08/18/22 2100 08/19/22 0105  BP: (!) 197/103 (!) 191/99  .     Dizziness D/d include neurosyphilis/ TIA/ CVA/ Hypertensive urgency.  We will monitor with aspiration/  fall precaution.  Mri official read pending.    CKD (chronic kidney disease) stage 3, GFR 30-59 ml/min Lab Results  Component Value Date   CREATININE 1.49 (H) 08/18/2022   CREATININE 1.09 03/18/2022   CREATININE 1.21 11/11/2021  Avoid nephrotoxic agents and renally dose  meds.  Ct abd noncontrast.    Tobacco use Nicotine patch.  History of ischemic stroke H/o CVA. Pt on no antiplatelet . We will start heparin for dvt prophylaxis.   COPD with emphysema Duonebs  q6. CPT.  Pulmonary consult per am team.    Essential hypertension Vitals:   08/18/22 1618 08/18/22 1800 08/18/22 1900 08/18/22 2000  BP: (!) 158/92 (!) 162/97 (!) 176/94 (!) 181/90   08/18/22 2100 08/19/22 0105  BP: (!) 197/103 (!) 191/99  PRN hydralazine.  Cont amlodipine. Ct negative for cva and MRI negative for any acute cva.     SYPHILIS Pt sees ID and we will consult ID - if pt needs to be continued on treatment.  Human immunodeficiency virus (HIV) disease Pt sees ID with [facility] regularly and is on genvoya.  Last cd4 count of over 600 in 11/23.  ID consult.  Do not suspect oppurtunistic infection.  We will repeat CD4 count and LDH.  Imaging does not identify findings concerning for TB.      DVT prophylaxis:  Heparin    Code Status:  Full code.       05/02/2020   11:30 AM  Advanced Directives  Does Patient Have a Medical Advance Directive? No  Would patient like information on creating a medical advance directive? No - Patient declined   Family Communication:  Left VM.  Emergency Contact: Contact Information     Name Relation Home Work Mobile   Rosalia Spouse [phone]     hall,sharon Daughter   [phone]   Lauris Chroman [phone]  [phone]       Disposition Plan:  Home.   Consults: ID: Dr. Rivka Safer.  Admission status: Inpatient.    Unit / Expected LOS: Stepdown.   Gertha Calkin MD [facility]  6 PM- 2 AM. [phone]( Pager )  For questions regarding this patient please use [email or web address] to contact the current [facility] MD.   Bonita Quin may also call [phone] to contact current Assigned [facility] Attending/Consulting MD for this patient.

## 2022-08-18 NOTE — ED Triage Notes (Signed)
First nurse note: Pt to ED via [facility] from home. Pt called due to generalized weakness that began today with difficulty ambulating. Pt afebrile.   EMS VS:  CBG 122 116/74 HR 82 96% RA

## 2022-08-18 NOTE — ED Provider Notes (Signed)
[facility] Provider Note    Event Date/Time   First MD Initiated Contact with Patient 08/18/22 1727     (approximate)   History   Chief Complaint: Shortness of Breath   HPI  Charles Daniel is a 82 y.o. male with a history of hypertension, stroke, emphysema, HIV, tuberculosis who comes ED complaining of generalized weakness, shortness of breath with walking, worse for the past 2 days.  Started after the patient was mowing grass 4 days ago.  Patient denies chest pain or fever.  No abdominal pain     Physical Exam   Triage Vital Signs: ED Triage Vitals  Enc Vitals Group     BP 08/18/22 1618 (!) 158/92     Pulse Rate 08/18/22 1618 (!) 101     Resp 08/18/22 1618 (!) 27     Temp 08/18/22 1616 98.4 F (36.9 C)     Temp src --      SpO2 08/18/22 1618 94 %     Weight 08/18/22 1617 135 lb (61.2 kg)     Height --      Head Circumference --      Peak Flow --      Pain Score 08/18/22 1617 0     Pain Loc --      Pain Edu? --      Excl. in GC? --     Most recent vital signs: Vitals:   08/18/22 2000 08/18/22 2100  BP: (!) 181/90 (!) 197/103  Pulse:    Resp: (!) 25 (!) 38  Temp:    SpO2:      General: Awake, no distress.  CV:  Good peripheral perfusion.  Regular rate and rhythm, heart rate 85 Resp:  Normal effort.  Symmetric air movement.  No focal crackles.  There is expiratory wheezing which is accentuated with FEV1 maneuver Abd:  No distention.  Soft nontender Other:  Cranial nerves III through XII intact, moving all extremities, no motor drift.   ED Results / Procedures / Treatments   Labs (all labs ordered are listed, but only abnormal results are displayed) Labs Reviewed  CBC - Abnormal; Notable for the following components:      Result Value   WBC 20.0 (*)    All other components within normal limits  BASIC METABOLIC PANEL - Abnormal; Notable for the following components:   Glucose, Bld 114 (*)    Creatinine, Ser 1.49 (*)    GFR,  Estimated 47 (*)    All other components within normal limits  D-DIMER, QUANTITATIVE - Abnormal; Notable for the following components:   D-Dimer, Quant >20.00 (*)    All other components within normal limits  HEPATIC FUNCTION PANEL - Abnormal; Notable for the following components:   AST 53 (*)    All other components within normal limits  LACTIC ACID, PLASMA - Abnormal; Notable for the following components:   Lactic Acid, Venous 4.4 (*)    All other components within normal limits  BLOOD GAS, ARTERIAL - Abnormal; Notable for the following components:   pO2, Arterial 71 (*)    All other components within normal limits  LIPASE, BLOOD  PROCALCITONIN  BRAIN NATRIURETIC PEPTIDE  LACTIC ACID, PLASMA  LACTATE DEHYDROGENASE  TROPONIN I (HIGH SENSITIVITY)     EKG Interpreted by me Normal sinus rhythm rate of 96.  Normal axis, normal intervals.  Poor R wave progression.  Normal ST segments and T waves.   RADIOLOGY Chest x-ray interpreted by me,  appears unremarkable.  Radiology report reviewed  CT angiogram chest negative for PE.  Does show some basilar congestion/mucous plugging.  CT head unremarkable   PROCEDURES:  Procedures   MEDICATIONS ORDERED IN ED: Medications  sodium chloride 0.9 % with cefTRIAXone (ROCEPHIN) ADS Med (has no administration in time range)  pantoprazole (PROTONIX) injection 40 mg (has no administration in time range)  ipratropium-albuterol (DUONEB) 0.5-2.5 (3) MG/3ML nebulizer solution 3 mL (3 mLs Nebulization Given 08/18/22 1621)  ipratropium-albuterol (DUONEB) 0.5-2.5 (3) MG/3ML nebulizer solution 3 mL (3 mLs Nebulization Given 08/18/22 1634)  lactated ringers bolus 1,000 mL (0 mLs Intravenous Stopped 08/18/22 2036)  ketorolac (TORADOL) 15 MG/ML injection 7.5 mg (7.5 mg Intravenous Given 08/18/22 1850)  iohexol (OMNIPAQUE) 350 MG/ML injection 75 mL (75 mLs Intravenous Contrast Given 08/18/22 1905)  methylPREDNISolone sodium succinate (SOLU-MEDROL) 125 mg/2  mL injection 125 mg (125 mg Intravenous Given 08/18/22 2109)  albuterol (PROVENTIL) (2.5 MG/3ML) 0.083% nebulizer solution 5 mg (5 mg Nebulization Given 08/18/22 2110)  sodium chloride 0.9 % bolus 1,000 mL (0 mLs Intravenous Stopped 08/18/22 2333)  cefTRIAXone (ROCEPHIN) 2 g in sodium chloride 0.9 % 100 mL IVPB (0 g Intravenous Stopped 08/18/22 2242)  azithromycin (ZITHROMAX) 500 mg in sodium chloride 0.9 % 250 mL IVPB (0 mg Intravenous Stopped 08/18/22 2333)     IMPRESSION / MDM / ASSESSMENT AND PLAN / ED COURSE  I reviewed the triage vital signs and the nursing notes.  DDx: Pneumonia, COPD exacerbation, dehydration, electrolyte abnormality, non-STEMI, heart failure, pulmonary edema, pleural effusion, pulmonary embolism, stroke  Patient's presentation is most consistent with acute presentation with potential threat to life or bodily function.  Patient presents with shortness of breath, wheezing, most likely COPD exacerbation.  Labs show leukocytosis of 20,000.  Also has a D-dimer greater than 20.  CT angiogram ordered for further assessment and rule out PE.   Clinical Course as of 08/18/22 2349  Tue Aug 18, 2022  2035 Workup reassuring. Pt still has tachypnea and expiratory wheezing. Will give solumedrol and additional albuterol. [PS]  2210 Lactate 4.4. no clear infectious source clinically.  Unclear cause of elevated lactate, but I suspect dehydration. Will check cultures, give rocephin and azithromycin, and plan to admit for further management pending culture data [PS]    Clinical Course User Index [PS] Sharman Cheek, MD    ----------------------------------------- 11:48 PM on 08/18/2022 ----------------------------------------- Case discussed with hospitalist will follow-up MRI.   FINAL CLINICAL IMPRESSION(S) / ED DIAGNOSES   Final diagnoses:  COPD exacerbation  Community acquired pneumonia, unspecified laterality  HIV infection, unspecified symptom status  Dizziness      Rx / DC Orders   ED Discharge Orders     None        Note:  This document was prepared using Dragon voice recognition software and may include unintentional dictation errors.   Sharman Cheek, MD 08/18/22 [phone]

## 2022-08-19 ENCOUNTER — Emergency Department: Payer: Medicare Other

## 2022-08-19 ENCOUNTER — Inpatient Hospital Stay: Payer: Medicare Other

## 2022-08-19 ENCOUNTER — Encounter: Payer: Self-pay | Admitting: Internal Medicine

## 2022-08-19 DIAGNOSIS — J440 Chronic obstructive pulmonary disease with acute lower respiratory infection: Secondary | ICD-10-CM | POA: Diagnosis present

## 2022-08-19 DIAGNOSIS — Z7902 Long term (current) use of antithrombotics/antiplatelets: Secondary | ICD-10-CM | POA: Diagnosis not present

## 2022-08-19 DIAGNOSIS — J9601 Acute respiratory failure with hypoxia: Secondary | ICD-10-CM | POA: Diagnosis present

## 2022-08-19 DIAGNOSIS — N183 Chronic kidney disease, stage 3 unspecified: Secondary | ICD-10-CM | POA: Diagnosis present

## 2022-08-19 DIAGNOSIS — J441 Chronic obstructive pulmonary disease with (acute) exacerbation: Secondary | ICD-10-CM | POA: Diagnosis present

## 2022-08-19 DIAGNOSIS — Z597 Insufficient social insurance and welfare support: Secondary | ICD-10-CM | POA: Diagnosis not present

## 2022-08-19 DIAGNOSIS — I10 Essential (primary) hypertension: Secondary | ICD-10-CM | POA: Diagnosis not present

## 2022-08-19 DIAGNOSIS — Z1152 Encounter for screening for COVID-19: Secondary | ICD-10-CM | POA: Diagnosis not present

## 2022-08-19 DIAGNOSIS — N179 Acute kidney failure, unspecified: Secondary | ICD-10-CM | POA: Diagnosis present

## 2022-08-19 DIAGNOSIS — J9621 Acute and chronic respiratory failure with hypoxia: Secondary | ICD-10-CM

## 2022-08-19 DIAGNOSIS — J189 Pneumonia, unspecified organism: Secondary | ICD-10-CM | POA: Diagnosis present

## 2022-08-19 DIAGNOSIS — Z8546 Personal history of malignant neoplasm of prostate: Secondary | ICD-10-CM | POA: Diagnosis not present

## 2022-08-19 DIAGNOSIS — J439 Emphysema, unspecified: Secondary | ICD-10-CM

## 2022-08-19 DIAGNOSIS — Z8673 Personal history of transient ischemic attack (TIA), and cerebral infarction without residual deficits: Secondary | ICD-10-CM | POA: Diagnosis not present

## 2022-08-19 DIAGNOSIS — Z85038 Personal history of other malignant neoplasm of large intestine: Secondary | ICD-10-CM | POA: Diagnosis not present

## 2022-08-19 DIAGNOSIS — R42 Dizziness and giddiness: Secondary | ICD-10-CM | POA: Diagnosis present

## 2022-08-19 DIAGNOSIS — B20 Human immunodeficiency virus [HIV] disease: Secondary | ICD-10-CM | POA: Diagnosis not present

## 2022-08-19 DIAGNOSIS — A419 Sepsis, unspecified organism: Secondary | ICD-10-CM

## 2022-08-19 DIAGNOSIS — E8721 Acute metabolic acidosis: Secondary | ICD-10-CM | POA: Diagnosis present

## 2022-08-19 DIAGNOSIS — R6520 Severe sepsis without septic shock: Secondary | ICD-10-CM | POA: Diagnosis present

## 2022-08-19 DIAGNOSIS — I129 Hypertensive chronic kidney disease with stage 1 through stage 4 chronic kidney disease, or unspecified chronic kidney disease: Secondary | ICD-10-CM | POA: Diagnosis present

## 2022-08-19 DIAGNOSIS — R0602 Shortness of breath: Secondary | ICD-10-CM | POA: Diagnosis not present

## 2022-08-19 DIAGNOSIS — G9341 Metabolic encephalopathy: Secondary | ICD-10-CM | POA: Diagnosis present

## 2022-08-19 DIAGNOSIS — G4733 Obstructive sleep apnea (adult) (pediatric): Secondary | ICD-10-CM | POA: Diagnosis present

## 2022-08-19 DIAGNOSIS — Z79899 Other long term (current) drug therapy: Secondary | ICD-10-CM | POA: Diagnosis not present

## 2022-08-19 DIAGNOSIS — Z8615 Personal history of latent tuberculosis infection: Secondary | ICD-10-CM | POA: Diagnosis not present

## 2022-08-19 DIAGNOSIS — Z87891 Personal history of nicotine dependence: Secondary | ICD-10-CM | POA: Diagnosis not present

## 2022-08-19 DIAGNOSIS — Z9049 Acquired absence of other specified parts of digestive tract: Secondary | ICD-10-CM | POA: Diagnosis not present

## 2022-08-19 LAB — T4, FREE: Free T4: 0.49 ng/dL — ABNORMAL LOW (ref 0.61–1.12)

## 2022-08-19 LAB — CBC WITH DIFFERENTIAL/PLATELET
Abs Immature Granulocytes: 0.29 10*3/uL — ABNORMAL HIGH (ref 0.00–0.07)
Basophils Absolute: 0 10*3/uL (ref 0.0–0.1)
Basophils Relative: 0 %
Eosinophils Absolute: 0 10*3/uL (ref 0.0–0.5)
Eosinophils Relative: 0 %
HCT: 35.4 % — ABNORMAL LOW (ref 39.0–52.0)
Hemoglobin: 11.7 g/dL — ABNORMAL LOW (ref 13.0–17.0)
Immature Granulocytes: 1 %
Lymphocytes Relative: 5 %
Lymphs Abs: 1 10*3/uL (ref 0.7–4.0)
MCH: 28.7 pg (ref 26.0–34.0)
MCHC: 33.1 g/dL (ref 30.0–36.0)
MCV: 86.8 fL (ref 80.0–100.0)
Monocytes Absolute: 0.4 10*3/uL (ref 0.1–1.0)
Monocytes Relative: 2 %
Neutro Abs: 19.7 10*3/uL — ABNORMAL HIGH (ref 1.7–7.7)
Neutrophils Relative %: 92 %
Platelets: 150 10*3/uL (ref 150–400)
RBC: 4.08 MIL/uL — ABNORMAL LOW (ref 4.22–5.81)
RDW: 14.4 % (ref 11.5–15.5)
WBC: 21.4 10*3/uL — ABNORMAL HIGH (ref 4.0–10.5)
nRBC: 0 % (ref 0.0–0.2)

## 2022-08-19 LAB — URINALYSIS, W/ REFLEX TO CULTURE (INFECTION SUSPECTED)
Bacteria, UA: NONE SEEN
Bilirubin Urine: NEGATIVE
Glucose, UA: NEGATIVE mg/dL
Ketones, ur: NEGATIVE mg/dL
Leukocytes,Ua: NEGATIVE
Nitrite: NEGATIVE
Protein, ur: NEGATIVE mg/dL
Specific Gravity, Urine: 1.023 (ref 1.005–1.030)
Squamous Epithelial / HPF: NONE SEEN /HPF (ref 0–5)
pH: 5 (ref 5.0–8.0)

## 2022-08-19 LAB — COMPREHENSIVE METABOLIC PANEL
ALT: 26 U/L (ref 0–44)
AST: 67 U/L — ABNORMAL HIGH (ref 15–41)
Albumin: 3.5 g/dL (ref 3.5–5.0)
Alkaline Phosphatase: 68 U/L (ref 38–126)
Anion gap: 10 (ref 5–15)
BUN: 21 mg/dL (ref 8–23)
CO2: 22 mmol/L (ref 22–32)
Calcium: 8.8 mg/dL — ABNORMAL LOW (ref 8.9–10.3)
Chloride: 105 mmol/L (ref 98–111)
Creatinine, Ser: 1.42 mg/dL — ABNORMAL HIGH (ref 0.61–1.24)
GFR, Estimated: 49 mL/min — ABNORMAL LOW (ref >60)
Glucose, Bld: 154 mg/dL — ABNORMAL HIGH (ref 70–99)
Potassium: 4.9 mmol/L (ref 3.5–5.1)
Sodium: 137 mmol/L (ref 135–145)
Total Bilirubin: 1.2 mg/dL (ref 0.3–1.2)
Total Protein: 7.1 g/dL (ref 6.5–8.1)

## 2022-08-19 LAB — RESPIRATORY PANEL BY PCR

## 2022-08-19 LAB — LACTATE DEHYDROGENASE: LDH: 431 U/L — ABNORMAL HIGH (ref 98–192)

## 2022-08-19 LAB — URINE DRUG SCREEN, QUALITATIVE (ARMC ONLY)
Amphetamines, Ur Screen: NOT DETECTED
Barbiturates, Ur Screen: NOT DETECTED
Benzodiazepine, Ur Scrn: NOT DETECTED
Cannabinoid 50 Ng, Ur ~~LOC~~: NOT DETECTED
Cocaine Metabolite,Ur ~~LOC~~: NOT DETECTED
MDMA (Ecstasy)Ur Screen: NOT DETECTED
Methadone Scn, Ur: NOT DETECTED
Opiate, Ur Screen: NOT DETECTED
Phencyclidine (PCP) Ur S: NOT DETECTED
Tricyclic, Ur Screen: NOT DETECTED

## 2022-08-19 LAB — TSH: TSH: 0.685 u[IU]/mL (ref 0.350–4.500)

## 2022-08-19 LAB — CULTURE, BLOOD (ROUTINE X 2): Culture: NO GROWTH

## 2022-08-19 LAB — LACTIC ACID, PLASMA
Lactic Acid, Venous: 2.5 mmol/L (ref 0.5–1.9)
Lactic Acid, Venous: 3.7 mmol/L (ref 0.5–1.9)
Lactic Acid, Venous: 5.8 mmol/L (ref 0.5–1.9)

## 2022-08-19 LAB — TYPE AND SCREEN
ABO/RH(D): A POS
Antibody Screen: NEGATIVE

## 2022-08-19 LAB — BLOOD GAS, ARTERIAL
Acid-Base Excess: 4.7 mmol/L — ABNORMAL HIGH (ref 0.0–2.0)
Bicarbonate: 28.2 mmol/L — ABNORMAL HIGH (ref 20.0–28.0)
Patient temperature: 37
pCO2 arterial: 37 mmHg (ref 32–48)
pH, Arterial: 7.49 — ABNORMAL HIGH (ref 7.35–7.45)

## 2022-08-19 LAB — CBG MONITORING, ED: Glucose-Capillary: 112 mg/dL — ABNORMAL HIGH (ref 70–99)

## 2022-08-19 LAB — MAGNESIUM: Magnesium: 2.1 mg/dL (ref 1.7–2.4)

## 2022-08-19 LAB — PHOSPHORUS: Phosphorus: 4.1 mg/dL (ref 2.5–4.6)

## 2022-08-19 LAB — SARS CORONAVIRUS 2 BY RT PCR: SARS Coronavirus 2 by RT PCR: NEGATIVE

## 2022-08-19 LAB — AMMONIA: Ammonia: 19 umol/L (ref 9–35)

## 2022-08-19 MED ORDER — LACTATED RINGERS IV SOLN: INTRAVENOUS | Status: DC

## 2022-08-19 MED ORDER — HYDRALAZINE HCL 20 MG/ML IJ SOLN: 5.0000 mg | INTRAMUSCULAR | Status: DC | PRN

## 2022-08-19 MED ORDER — SODIUM CHLORIDE 0.9 % IV SOLN
2.0000 g | INTRAVENOUS | Status: DC
  Administered 2022-08-19 – 2022-08-20 (×2): 2 g via INTRAVENOUS
  Filled 2022-08-19 (×2): qty 2

## 2022-08-19 MED ORDER — HALOPERIDOL LACTATE 5 MG/ML IJ SOLN
2.0000 mg | Freq: Once | INTRAMUSCULAR | Status: AC
  Administered 2022-08-19: 2 mg via INTRAVENOUS
  Filled 2022-08-19: qty 1

## 2022-08-19 MED ORDER — HYDRALAZINE HCL 20 MG/ML IJ SOLN
10.0000 mg | INTRAMUSCULAR | Status: DC | PRN
  Administered 2022-08-19 – 2022-08-20 (×4): 10 mg via INTRAVENOUS
  Filled 2022-08-19 (×4): qty 1

## 2022-08-19 MED ORDER — LORAZEPAM 2 MG/ML IJ SOLN
0.5000 mg | Freq: Once | INTRAMUSCULAR | Status: AC
  Administered 2022-08-19: 0.5 mg via INTRAVENOUS
  Filled 2022-08-19: qty 1

## 2022-08-19 MED ORDER — HYDRALAZINE HCL 20 MG/ML IJ SOLN: 10.0000 mg | Freq: Once | INTRAMUSCULAR | Status: DC

## 2022-08-19 MED ORDER — HEPARIN SODIUM (PORCINE) 5000 UNIT/ML IJ SOLN
5000.0000 [IU] | Freq: Three times a day (TID) | INTRAMUSCULAR | Status: DC
  Administered 2022-08-19 – 2022-08-22 (×9): 5000 [IU] via SUBCUTANEOUS
  Filled 2022-08-19 (×10): qty 1

## 2022-08-19 MED ORDER — SODIUM CHLORIDE 0.9 % IV SOLN
500.0000 mg | Freq: Every day | INTRAVENOUS | Status: DC
  Administered 2022-08-19 – 2022-08-21 (×2): 500 mg via INTRAVENOUS
  Filled 2022-08-19 (×2): qty 500

## 2022-08-19 MED ORDER — HALOPERIDOL LACTATE 5 MG/ML IJ SOLN
2.0000 mg | Freq: Four times a day (QID) | INTRAMUSCULAR | Status: DC | PRN
  Administered 2022-08-19 (×2): 2 mg via INTRAVENOUS
  Filled 2022-08-19 (×3): qty 1

## 2022-08-19 MED ORDER — ACETAMINOPHEN 325 MG PO TABS
650.0000 mg | ORAL_TABLET | Freq: Four times a day (QID) | ORAL | Status: DC | PRN
  Administered 2022-08-21 – 2022-08-22 (×3): 650 mg via ORAL
  Filled 2022-08-19 (×3): qty 2

## 2022-08-19 MED ORDER — LABETALOL HCL 5 MG/ML IV SOLN
10.0000 mg | INTRAVENOUS | Status: DC | PRN
  Administered 2022-08-19 – 2022-08-20 (×3): 10 mg via INTRAVENOUS
  Filled 2022-08-19 (×3): qty 4

## 2022-08-19 MED ORDER — MAGNESIUM SULFATE 2 GM/50ML IV SOLN
2.0000 g | Freq: Once | INTRAVENOUS | Status: AC
  Administered 2022-08-19: 2 g via INTRAVENOUS
  Filled 2022-08-19: qty 50

## 2022-08-19 MED ORDER — AMLODIPINE BESYLATE 5 MG PO TABS
5.0000 mg | ORAL_TABLET | Freq: Every day | ORAL | Status: DC
  Administered 2022-08-19 – 2022-08-20 (×2): 5 mg via ORAL
  Filled 2022-08-19 (×2): qty 1

## 2022-08-19 MED ORDER — METHYLPREDNISOLONE SODIUM SUCC 40 MG IJ SOLR
40.0000 mg | Freq: Two times a day (BID) | INTRAMUSCULAR | Status: DC
  Administered 2022-08-19 – 2022-08-20 (×4): 40 mg via INTRAVENOUS
  Filled 2022-08-19 (×5): qty 1

## 2022-08-19 MED ORDER — ATORVASTATIN CALCIUM 20 MG PO TABS
40.0000 mg | ORAL_TABLET | Freq: Every day | ORAL | Status: DC
  Administered 2022-08-19 – 2022-08-22 (×4): 40 mg via ORAL
  Filled 2022-08-19 (×5): qty 2

## 2022-08-19 MED ORDER — ACETAMINOPHEN 650 MG RE SUPP: 650.0000 mg | Freq: Four times a day (QID) | RECTAL | Status: DC | PRN

## 2022-08-19 MED ORDER — SODIUM CHLORIDE 0.9% FLUSH
3.0000 mL | Freq: Two times a day (BID) | INTRAVENOUS | Status: DC
  Administered 2022-08-19 – 2022-08-20 (×3): 3 mL via INTRAVENOUS

## 2022-08-19 MED ORDER — LORAZEPAM 0.5 MG PO TABS
0.5000 mg | ORAL_TABLET | Freq: Once | ORAL | Status: DC
  Filled 2022-08-19: qty 1

## 2022-08-19 MED ORDER — LORAZEPAM 2 MG/ML IJ SOLN
1.0000 mg | INTRAMUSCULAR | Status: DC | PRN
  Administered 2022-08-19: 1 mg via INTRAVENOUS
  Filled 2022-08-19: qty 1

## 2022-08-19 MED ORDER — VITAMIN D 25 MCG (1000 UNIT) PO TABS
1000.0000 [IU] | ORAL_TABLET | Freq: Every day | ORAL | Status: DC
  Administered 2022-08-19 – 2022-08-22 (×4): 1000 [IU] via ORAL
  Filled 2022-08-19 (×5): qty 1

## 2022-08-19 MED ORDER — IPRATROPIUM-ALBUTEROL 0.5-2.5 (3) MG/3ML IN SOLN
3.0000 mL | Freq: Four times a day (QID) | RESPIRATORY_TRACT | Status: DC | PRN
  Administered 2022-08-19 – 2022-08-21 (×7): 3 mL via RESPIRATORY_TRACT
  Filled 2022-08-19: qty 6
  Filled 2022-08-19 (×5): qty 3

## 2022-08-19 NOTE — Assessment & Plan Note (Addendum)
Pt meets severe sepsis criteria. We will cont iv abx. Supportive care / Follow C/S.  Culture not collected in Ed were collected after iv abx.  Vitals:   08/18/22 1618 08/18/22 1800 08/18/22 1900 08/18/22 2000  BP: (!) 158/92 (!) 162/97 (!) 176/94 (!) 181/90   08/18/22 2100 08/19/22 0105  BP: (!) 197/103 (!) 191/99  .

## 2022-08-19 NOTE — ED Notes (Signed)
Pt. Continues to have increasing agitation, states, "I'm gonna get up [place] here!" And pt. Pulled off O2 Athalia and attempting to pull out IV. Safety sitter stopped pt. From pulling out IV by holding his arm and pt verbally threatened her. Pt. Attempting to get out of bed, putting legs over siderails. Prn Ativan given. While pt. Resting with eyes closed, oxygen placed back on pt, as well as BP and pulse ox monitoring. Pt. Waking up intermittently, attempting to get out of bed. Pt. Redirected.

## 2022-08-19 NOTE — Consult Note (Signed)
PULMONOLOGY         Date: 08/19/2022,   MRN# [id number] Charles Daniel Apr 04, 1941     AdmissionWeight: 61.2 kg                 CurrentWeight: 61.2 kg  Referring provider: Dr Allena Katz   CHIEF COMPLAINT:   Acute respiratory distress with hypoxemia   HISTORY OF PRESENT ILLNESS   82 yo with hx of depression, bilateral hydrocele , diverticulossis, advanced emphysema, colon cancer, prostate cancer, HTN , previous syphillis and latent TB who was outdoors and noted worsening dyspnea and SOB acutely and came in for this reason. He was found to have severe tachypnea and tachycardia leukocytosis and AKI.  He has HIV and rectal carcinoma.     PAST MEDICAL HISTORY   Past Medical History:  Diagnosis Date   Bilateral hydrocele 2012   Depression    Diverticulosis 2013   by colonoscopy   Emphysema lung 03/2014    by CXR, remote smoking history   History of colon cancer    2007 --  S/P RECTOSIGMOID COLECTOMY--  NO CHEMORADIATION--  NO RECURRENCE   History of CVA (cerebrovascular accident)    2003-  RIGHT MIDDLE CVA---   NO RESIDUAL   History of hepatitis B    REMOTE AND INACTIVE  PER DOCUMENTATION   History of syphilis    SECONDARY SYPHILITIS TX'D IN 1998  PER DOCUMENTATION   History of tuberculosis    LATENT TB  TX'D X12  MONTHS IN 1995   HIV infection    DX 1995--  MONITORED BY INFECTIOUS DISEASE (DR CAMPBELL)   Hypertension    Prostate carcinoma dx 09/2012   T1c, brachytherapy/seed implant Vernie Ammons, Kathrynn Running)     SURGICAL HISTORY   Past Surgical History:  Procedure Laterality Date   COLONOSCOPY  08/2011   3 polyps, diverticulosis, rec rpt 5 yrs Arlyce Dice)   COLONOSCOPY  10/2016   7 TAs, diverticulosis, no rpt recommended ([name])   INGUINAL HERNIA REPAIR  1994   UNILATERAL   LOW ANTERIOR RESECTION RECTOSIGMOID COLON  03-09-2006   PROSTATE BIOPSY  09/2012   53cc  (MD OFFICE)   RADIOACTIVE SEED IMPLANT N/A 01/20/2013   Procedure: RADIOACTIVE SEED IMPLANT;  Surgeon:  Garnett Farm, MD;  as well as Rose Phi MD   TRANSTHORACIC ECHOCARDIOGRAM  11-16-2001   LV WALL THICKNESS MODERATELY INCREASED/  EF 55-65%/ LVSF NORMAL     FAMILY HISTORY   Family History  Problem Relation Age of Onset   Cancer Sister        breast   Breast cancer Sister    Cancer Brother 17       prostate, treated with seed implant   Cancer Brother        prostate   Cancer Brother        prostate   Cancer Daughter        breast   Cancer Other        prostate   Cancer Father        unsure   CAD Neg Hx    Stroke Neg Hx    Diabetes Neg Hx      SOCIAL HISTORY   Social History   Tobacco Use   Smoking status: Former    Packs/day: 0.30    Years: 35.00    Additional pack years: 0.00    Total pack years: 10.50    Types: Cigarettes    Quit date: 05/02/2020  Years since quitting: 2.2   Smokeless tobacco: Never  Vaping Use   Vaping Use: Never used  Substance Use Topics   Alcohol use: No    Alcohol/week: 0.0 standard drinks of alcohol   Drug use: No     MEDICATIONS    Home Medication:  Current Outpatient Rx   Order #: [id number] Class: Normal   Order #: [id number] Class: Normal   Order #: [id number] Class: Normal   Order #: [id number] Class: OTC   Order #: [id number] Class: Normal   Order #: [id number] Class: Normal   Order #: [id number] Class: Normal   Order #: [id number] Class: Normal   Order #: [id number] Class: Normal    Current Medication:  Current Facility-Administered Medications:    acetaminophen (TYLENOL) tablet 650 mg, 650 mg, Oral, Q6H PRN **OR** acetaminophen (TYLENOL) suppository 650 mg, 650 mg, Rectal, Q6H PRN, Allena Katz, Ekta V, MD   amLODipine (NORVASC) tablet 5 mg, 5 mg, Oral, Daily, Irena Cords V, MD, 5 mg at 08/19/22 1025   atorvastatin (LIPITOR) tablet 40 mg, 40 mg, Oral, Daily, Irena Cords V, MD, 40 mg at 08/19/22 1028   azithromycin (ZITHROMAX) 500 mg in sodium chloride 0.9 % 250 mL IVPB, 500 mg, Intravenous, Q0600, Irena Cords V, MD    cefTRIAXone (ROCEPHIN) 2 g in sodium chloride 0.9 % 100 mL IVPB, 2 g, Intravenous, Q24H, Patel, Ekta V, MD   cholecalciferol (VITAMIN D3) 25 MCG (1000 UNIT) tablet 1,000 Units, 1,000 Units, Oral, Daily, Irena Cords V, MD, 1,000 Units at 08/19/22 1029   haloperidol lactate (HALDOL) injection 2 mg, 2 mg, Intravenous, Q6H PRN, Enedina Finner, MD, 2 mg at 08/19/22 0823   heparin injection 5,000 Units, 5,000 Units, Subcutaneous, Q8H, Gertha Calkin, MD, 5,000 Units at 08/19/22 0241   hydrALAZINE (APRESOLINE) injection 10 mg, 10 mg, Intravenous, Q4H PRN, Enedina Finner, MD, 10 mg at 08/19/22 0919   ipratropium-albuterol (DUONEB) 0.5-2.5 (3) MG/3ML nebulizer solution 3 mL, 3 mL, Nebulization, Q6H PRN, Gertha Calkin, MD   lactated ringers infusion, , Intravenous, Continuous, Enedina Finner, MD, Last Rate: 100 mL/hr at 08/19/22 0633, New Bag at 08/19/22 1884   magnesium sulfate IVPB 2 g 50 mL, 2 g, Intravenous, Once, Enedina Finner, MD   methylPREDNISolone sodium succinate (SOLU-MEDROL) 40 mg/mL injection 40 mg, 40 mg, Intravenous, Q12H, Irena Cords V, MD, 40 mg at 08/19/22 1000   sodium chloride flush (NS) 0.9 % injection 3 mL, 3 mL, Intravenous, Q12H, Irena Cords V, MD, 3 mL at 08/19/22 0130  Current Outpatient Medications:    amLODipine (NORVASC) 5 MG tablet, TAKE 1 TABLET BY MOUTH EVERY DAY, Disp: 90 tablet, Rfl: 3   atorvastatin (LIPITOR) 40 MG tablet, Take 1 tablet (40 mg total) by mouth daily., Disp: 90 tablet, Rfl: 0   azithromycin (ZITHROMAX) 250 MG tablet, Take two tablets on day one followed by one tablet on days 2-5 (Patient not taking: Reported on 12/17/2020), Disp: 6 each, Rfl: 0   Cholecalciferol (VITAMIN D3) 25 MCG (1000 UT) CAPS, Take 1 capsule (1,000 Units total) by mouth daily., Disp: 30 capsule, Rfl:    clopidogrel (PLAVIX) 75 MG tablet, Take 1 tablet (75 mg total) by mouth daily. (Patient not taking: Reported on 12/17/2020), Disp: 90 tablet, Rfl: 3   doxycycline (VIBRAMYCIN) 100 MG capsule, Take 1  capsule (100 mg total) by mouth 2 (two) times daily. (Patient not taking: Reported on 04/01/2022), Disp: 20 capsule, Rfl: 0   GENVOYA 150-150-200-10 MG TABS tablet, TAKE 1 TABLET BY MOUTH DAILY WITH BREAKFAST., Disp: 30 tablet, Rfl:  11   lisinopril-hydrochlorothiazide (ZESTORETIC) 20-12.5 MG tablet, TAKE 1 TABLET BY MOUTH EVERY DAY, Disp: 90 tablet, Rfl: 3   predniSONE (DELTASONE) 50 MG tablet, One tablet po once a day (Patient not taking: Reported on 04/01/2022), Disp: 6 tablet, Rfl: 0    ALLERGIES   Patient has no known allergies.     REVIEW OF SYSTEMS    Review of Systems:  Gen:  Denies  fever, sweats, chills weigh loss  HEENT: Denies blurred vision, double vision, ear pain, eye pain, hearing loss, nose bleeds, sore throat Cardiac:  No dizziness, chest pain or heaviness, chest tightness,edema Resp:   reports dyspnea chronically  Gi: Denies swallowing difficulty, stomach pain, nausea or vomiting, diarrhea, constipation, bowel incontinence Gu:  Denies bladder incontinence, burning urine Ext:   Denies Joint pain, stiffness or swelling Skin: Denies  skin rash, easy bruising or bleeding or hives Endoc:  Denies polyuria, polydipsia , polyphagia or weight change Psych:   Denies depression, insomnia or hallucinations   Other:  All other systems negative   VS: BP (!) 173/92   Pulse (!) 106   Temp 98.4 F (36.9 C)   Resp (!) 24   Wt 61.2 kg   SpO2 98%   BMI 22.12 kg/m      PHYSICAL EXAM    GENERAL:NAD, no fevers, chills, no weakness no fatigue HEAD: Normocephalic, atraumatic.  EYES: Pupils equal, round, reactive to light. Extraocular muscles intact. No scleral icterus.  MOUTH: Moist mucosal membrane. Dentition intact. No abscess noted.  EAR, NOSE, THROAT: Clear without exudates. No external lesions.  NECK: Supple. No thyromegaly. No nodules. No JVD.  PULMONARY: decreased breath sounds with mild rhonchi worse at bases bilaterally.  CARDIOVASCULAR: S1 and S2. Regular  rate and rhythm. No murmurs, rubs, or gallops. No edema. Pedal pulses 2+ bilaterally.  GASTROINTESTINAL: Soft, nontender, nondistended. No masses. Positive bowel sounds. No hepatosplenomegaly.  MUSCULOSKELETAL: No swelling, clubbing, or edema. Range of motion full in all extremities.  NEUROLOGIC: Cranial nerves II through XII are intact. No gross focal neurological deficits. Sensation intact. Reflexes intact.  SKIN: No ulceration, lesions, rashes, or cyanosis. Skin warm and dry. Turgor intact.  PSYCHIATRIC: Mood, affect within normal limits. The patient is awake, alert and oriented x 3. Insight, judgment intact.       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   Patient/Family are satisfied with care plan and all questions have been answered.    Provider disclosure: Patient with at least one acute or chronic illness or injury that poses a threat to life or bodily function and is being managed actively during this encounter.  All of  the below services have been performed independently by signing provider:  review of prior documentation from internal and or external health records.  Review of previous and current lab results.  Interview and comprehensive assessment during patient visit today. Review of current and previous chest radiographs/CT scans. Discussion of management and test interpretation with health care team and  patient/family.   This document was prepared using Dragon voice recognition software and may include unintentional dictation errors.     Vida Rigger, M.D.  Division of Pulmonary & Critical Care Medicine

## 2022-08-19 NOTE — ED Notes (Signed)
Since returning from MRI pt has been combative and aggressive. 1:1 sitter has been at bedside, provider notified, orders received and executed. Provider came to bedside. Pt is refusing all monitoring even after receiving medications

## 2022-08-19 NOTE — Assessment & Plan Note (Addendum)
Pt presenting with SOB and found to be hypoxic with PNA and severe sepsis. We will admit to stepdown unit.  Cont duoneb. Cont iv abx from ed: Rocephin and azithromycin. Sputum culture.  ID consult.  SpO2: 98 % On RA but hypoxia noted on ABG. Pulmonary consult for eval of Within the posterolateral left lower lobe there is an area of nodular consolidation measuring 2.0 x 1.0 cm.

## 2022-08-19 NOTE — ED Notes (Incomplete)
This
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
## 2022-08-19 NOTE — Hospital Course (Signed)
1:03 AM Called wife ms helen- No answer.unable to leave voicemail .

## 2022-08-19 NOTE — ED Notes (Addendum)
This RN to bedside to assess pt. Pt. Is attempting to get out of bed, see neuro assessment. Safety sitter at bedside. Dr. Allena Katz notified of pt's BP and current mental status, and unwillingness to keep cardiac monitoring on.

## 2022-08-19 NOTE — Assessment & Plan Note (Signed)
H/o CVA. Pt on no antiplatelet . We will start heparin for dvt prophylaxis.

## 2022-08-19 NOTE — ED Notes (Signed)
Pt. Repositioned for comfort and to eat. Pt's step son in law at bedside, feeding pt. Pureed, thickened liquids. Pt. Tolerated well, NAD.

## 2022-08-19 NOTE — Assessment & Plan Note (Signed)
Lab Results  Component Value Date   CREATININE 1.49 (H) 08/18/2022   CREATININE 1.09 03/18/2022   CREATININE 1.21 11/11/2021  Avoid nephrotoxic agents and renally dose  meds.  Ct abd noncontrast.

## 2022-08-19 NOTE — Assessment & Plan Note (Signed)
Duonebs q6. CPT.  Pulmonary consult per am team.
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
## 2022-08-19 NOTE — ED Notes (Signed)
Rad at bedside.

## 2022-08-19 NOTE — Assessment & Plan Note (Signed)
-  Nicotine patch 

## 2022-08-19 NOTE — Assessment & Plan Note (Signed)
D/d include neurosyphilis/ TIA/ CVA/ Hypertensive urgency.  We will monitor with aspiration/  fall precaution.  Mri official read pending.

## 2022-08-19 NOTE — Assessment & Plan Note (Addendum)
Pt sees ID and we will consult ID - if pt needs to be continued on treatment.

## 2022-08-19 NOTE — Assessment & Plan Note (Addendum)
Pt sees ID with [facility] regularly and is on genvoya.  Last cd4 count of over 600 in 11/23.  ID consult.  Do not suspect oppurtunistic infection.  We will repeat CD4 count and LDH.  Imaging does not identify findings concerning for TB.

## 2022-08-19 NOTE — Consult Note (Addendum)
NAME: Charles Daniel  DOB: 1940-12-21  MRN: [id number]  Date/Time: 08/19/2022 1:26 PM  REQUESTING PROVIDER: Dr. Allena Katz Subjective:  REASON FOR CONSULT:HIV  ?No history available from patient as he is sedated  Charles Daniel is a 82 y.o. with a history of COPD, HIV on Genvoya with undetectable Vl and cd4 of 631 on 03/18/22 , CVA, HTN , treated latent TB, treated syphilis now serofast, rectal carcinoma s/p resection, prostate ca s/p brachytherapy presents to the ED with sob , cough for 4 days   08/18/22 16:18  BP 158/92 (H)  Pulse Rate 101 !  Resp 27 !  SpO2 94 %  Temp 98.4  Labs  Latest Reference Range & Units 08/18/22 16:21  WBC 4.0 - 10.5 K/uL 20.0 (H)  Hemoglobin 13.0 - 17.0 g/dL 11.9  HCT 14.7 - 82.9 % 41.9  Platelets 150 - 400 K/uL 182  Creatinine 0.61 - 1.24 mg/dL 5.62 (H)   Pt was agitated this morning and received ativan and is sedated As per son in law at bed side he says patient is fairly active Lives with his wife who is now admitted in [place] for a stroke  In he ED Cxr appeared okay but CT showed some bronchitis changes He was started on Iv ceftriaxone and azithromycin and steroids Past Medical History:  Diagnosis Date   Bilateral hydrocele 2012   Depression    Diverticulosis 2013   by colonoscopy   Emphysema lung 03/2014    by CXR, remote smoking history   History of colon cancer    2007 --  S/P RECTOSIGMOID COLECTOMY--  NO CHEMORADIATION--  NO RECURRENCE   History of CVA (cerebrovascular accident)    2003-  RIGHT MIDDLE CVA---   NO RESIDUAL   History of hepatitis B    REMOTE AND INACTIVE  PER DOCUMENTATION   History of syphilis    SECONDARY SYPHILITIS TX'D IN 1998  PER DOCUMENTATION   History of tuberculosis    LATENT TB  TX'D X12  MONTHS IN 1995   HIV infection    DX 1995--  MONITORED BY INFECTIOUS DISEASE (DR CAMPBELL)   Hypertension    Prostate carcinoma dx 09/2012   T1c, brachytherapy/seed implant Estelle June)    Past Surgical History:  Procedure  Laterality Date   COLONOSCOPY  08/2011   3 polyps, diverticulosis, rec rpt 5 yrs Arlyce Dice)   COLONOSCOPY  10/2016   7 TAs, diverticulosis, no rpt recommended ([name])   INGUINAL HERNIA REPAIR  1994   UNILATERAL   LOW ANTERIOR RESECTION RECTOSIGMOID COLON  03-09-2006   PROSTATE BIOPSY  09/2012   53cc  (MD OFFICE)   RADIOACTIVE SEED IMPLANT N/A 01/20/2013   Procedure: RADIOACTIVE SEED IMPLANT;  Surgeon: Garnett Farm, MD;  as well as Rose Phi MD   TRANSTHORACIC ECHOCARDIOGRAM  11-16-2001   LV WALL THICKNESS MODERATELY INCREASED/  EF 55-65%/ LVSF NORMAL    Social History   Socioeconomic History   Marital status: Married    Spouse name: Not on file   Number of children: Not on file   Years of education: Not on file   Highest education level: Not on file  Occupational History   Not on file  Tobacco Use   Smoking status: Former    Packs/day: 0.30    Years: 35.00    Additional pack years: 0.00    Total pack years: 10.50    Types: Cigarettes    Quit date: 05/02/2020    Years since quitting: 2.2  Smokeless tobacco: Never  Vaping Use   Vaping Use: Never used  Substance and Sexual Activity   Alcohol use: No    Alcohol/week: 0.0 standard drinks of alcohol   Drug use: No   Sexual activity: Not Currently    Comment: declined condoms  Other Topics Concern   Not on file  Social History Narrative   Does not want ID status disclosed to family   Caffeine: 3 sodas/day   Lives with wife.  2 grown sons.   Occupation: retired from city of [place], now custodian at [employer]   Edu: 10th grade   Activity: walking at work   Diet: some water, fruits and vegetables regular      Pt screened positive for OSA using STOP BANG tool   Social Determinants of Corporate investment banker Strain: Not on file  Food Insecurity: Not on file  Transportation Needs: Not on file  Physical Activity: Not on file  Stress: Not on file  Social Connections: Not on file  Intimate Partner Violence:  Not on file    Family History  Problem Relation Age of Onset   Cancer Sister        breast   Breast cancer Sister    Cancer Brother 56       prostate, treated with seed implant   Cancer Brother        prostate   Cancer Brother        prostate   Cancer Daughter        breast   Cancer Other        prostate   Cancer Father        unsure   CAD Neg Hx    Stroke Neg Hx    Diabetes Neg Hx    No Known Allergies I? Current Facility-Administered Medications  Medication Dose Route Frequency Provider Last Rate Last Admin   acetaminophen (TYLENOL) tablet 650 mg  650 mg Oral Q6H PRN Gertha Calkin, MD       Or   acetaminophen (TYLENOL) suppository 650 mg  650 mg Rectal Q6H PRN Gertha Calkin, MD       amLODipine (NORVASC) tablet 5 mg  5 mg Oral Daily Irena Cords V, MD   5 mg at 08/19/22 1025   atorvastatin (LIPITOR) tablet 40 mg  40 mg Oral Daily Irena Cords V, MD   40 mg at 08/19/22 1028   azithromycin (ZITHROMAX) 500 mg in sodium chloride 0.9 % 250 mL IVPB  500 mg Intravenous Q0600 Gertha Calkin, MD       cefTRIAXone (ROCEPHIN) 2 g in sodium chloride 0.9 % 100 mL IVPB  2 g Intravenous Q24H Irena Cords V, MD       cholecalciferol (VITAMIN D3) 25 MCG (1000 UNIT) tablet 1,000 Units  1,000 Units Oral Daily Irena Cords V, MD   1,000 Units at 08/19/22 1029   haloperidol lactate (HALDOL) injection 2 mg  2 mg Intravenous Q6H PRN Enedina Finner, MD   2 mg at 08/19/22 0823   heparin injection 5,000 Units  5,000 Units Subcutaneous Q8H Gertha Calkin, MD   5,000 Units at 08/19/22 0241   hydrALAZINE (APRESOLINE) injection 10 mg  10 mg Intravenous Q4H PRN Enedina Finner, MD   10 mg at 08/19/22 1325   ipratropium-albuterol (DUONEB) 0.5-2.5 (3) MG/3ML nebulizer solution 3 mL  3 mL Nebulization Q6H PRN Gertha Calkin, MD   3 mL at 08/19/22 1130   labetalol (NORMODYNE) injection  10 mg  10 mg Intravenous Q4H PRN Enedina Finner, MD   10 mg at 08/19/22 1206   lactated ringers infusion   Intravenous Continuous Enedina Finner,  MD 100 mL/hr at 08/19/22 0633 New Bag at 08/19/22 1610   methylPREDNISolone sodium succinate (SOLU-MEDROL) 40 mg/mL injection 40 mg  40 mg Intravenous Q12H Irena Cords V, MD   40 mg at 08/19/22 1000   sodium chloride flush (NS) 0.9 % injection 3 mL  3 mL Intravenous Q12H Gertha Calkin, MD   3 mL at 08/19/22 0130   Current Outpatient Medications  Medication Sig Dispense Refill   amLODipine (NORVASC) 5 MG tablet TAKE 1 TABLET BY MOUTH EVERY DAY 90 tablet 3   atorvastatin (LIPITOR) 40 MG tablet Take 1 tablet (40 mg total) by mouth daily. 90 tablet 0   azithromycin (ZITHROMAX) 250 MG tablet Take two tablets on day one followed by one tablet on days 2-5 (Patient not taking: Reported on 12/17/2020) 6 each 0   Cholecalciferol (VITAMIN D3) 25 MCG (1000 UT) CAPS Take 1 capsule (1,000 Units total) by mouth daily. 30 capsule    clopidogrel (PLAVIX) 75 MG tablet Take 1 tablet (75 mg total) by mouth daily. (Patient not taking: Reported on 12/17/2020) 90 tablet 3   doxycycline (VIBRAMYCIN) 100 MG capsule Take 1 capsule (100 mg total) by mouth 2 (two) times daily. (Patient not taking: Reported on 04/01/2022) 20 capsule 0   GENVOYA 150-150-200-10 MG TABS tablet TAKE 1 TABLET BY MOUTH DAILY WITH BREAKFAST. 30 tablet 11   lisinopril-hydrochlorothiazide (ZESTORETIC) 20-12.5 MG tablet TAKE 1 TABLET BY MOUTH EVERY DAY 90 tablet 3   predniSONE (DELTASONE) 50 MG tablet One tablet po once a day (Patient not taking: Reported on 04/01/2022) 6 tablet 0     Abtx:  Anti-infectives (From admission, onward)    Start     Dose/Rate Route Frequency Ordered Stop   08/19/22 2200  cefTRIAXone (ROCEPHIN) 2 g in sodium chloride 0.9 % 100 mL IVPB        2 g 200 mL/hr over 30 Minutes Intravenous Every 24 hours 08/19/22 0120     08/19/22 2200  azithromycin (ZITHROMAX) 500 mg in sodium chloride 0.9 % 250 mL IVPB        500 mg 250 mL/hr over 60 Minutes Intravenous Daily 08/19/22 0120 08/24/22 0559   08/18/22 2200  cefTRIAXone  (ROCEPHIN) 2 g in sodium chloride 0.9 % 100 mL IVPB        2 g 200 mL/hr over 30 Minutes Intravenous  Once 08/18/22 2151 08/18/22 2242   08/18/22 2200  azithromycin (ZITHROMAX) 500 mg in sodium chloride 0.9 % 250 mL IVPB        500 mg 250 mL/hr over 60 Minutes Intravenous  Once 08/18/22 2151 08/18/22 2333   08/18/22 2115  doxycycline (VIBRA-TABS) tablet 100 mg  Status:  Discontinued        100 mg Oral  Once 08/18/22 2108 08/18/22 2151   08/18/22 2045  amoxicillin-clavulanate (AUGMENTIN) 875-125 MG per tablet 1 tablet  Status:  Discontinued        1 tablet Oral  Once 08/18/22 2035 08/18/22 2108       REVIEW OF SYSTEMS:  NA Objective:  VITALS:  BP (!) 170/93   Pulse 97   Temp 98.4 F (36.9 C)   Resp (!) 21   Wt 61.2 kg   SpO2 98%   BMI 22.12 kg/m   PHYSICAL EXAM:  General: sedated ,   Head:  Normocephalic, without obvious abnormality, atraumatic. Eyes: did not examine ENT did not examine Neck:, symmetrical, no adenopathy, thyroid: non tender no carotid bruit and no JVD.  Lungs:b/l rhonchi Heart: Tachycardia Abdomen: Soft,  Extremities: Mitts on his hands no edema feet Skin: limited examination  Lymph: Cervical, supraclavicular normal. Neurologic: cannot assess Pertinent Labs Lab Results CBC    Component Value Date/Time   WBC 21.4 (H) 08/19/2022 0610   RBC 4.08 (L) 08/19/2022 0610   HGB 11.7 (L) 08/19/2022 0610   HGB 15.3 08/24/2007 0835   HCT 35.4 (L) 08/19/2022 0610   HCT 45.1 08/24/2007 0835   PLT 150 08/19/2022 0610   PLT 227 08/24/2007 0835   MCV 86.8 08/19/2022 0610   MCV 84.6 08/24/2007 0835   MCH 28.7 08/19/2022 0610   MCHC 33.1 08/19/2022 0610   RDW 14.4 08/19/2022 0610   RDW 14.1 08/24/2007 0835   LYMPHSABS 1.0 08/19/2022 0610   LYMPHSABS 2.0 08/24/2007 0835   MONOABS 0.4 08/19/2022 0610   MONOABS 0.4 08/24/2007 0835   EOSABS 0.0 08/19/2022 0610   EOSABS 0.2 08/24/2007 0835   BASOSABS 0.0 08/19/2022 0610   BASOSABS 0.1 08/24/2007 0835        Latest Ref Rng & Units 08/19/2022    6:10 AM 08/18/2022    4:21 PM 03/18/2022    8:29 AM  CMP  Glucose 70 - 99 mg/dL 161  096  95   BUN 8 - 23 mg/dL 21  15  11    Creatinine 0.61 - 1.24 mg/dL 0.45  4.09  8.11   Sodium 135 - 145 mmol/L 137  135  138   Potassium 3.5 - 5.1 mmol/L 4.9  4.4  4.1   Chloride 98 - 111 mmol/L 105  102  101   CO2 22 - 32 mmol/L 22  22  30    Calcium 8.9 - 10.3 mg/dL 8.8  9.2  9.8   Total Protein 6.5 - 8.1 g/dL 7.1  7.8  7.8   Total Bilirubin 0.3 - 1.2 mg/dL 1.2  1.0  0.4   Alkaline Phos 38 - 126 U/L 68  84    AST 15 - 41 U/L 67  53  25   ALT 0 - 44 U/L 26  27  21        Microbiology: Recent Results (from the past 240 hour(s))  Culture, blood (Routine X 2) w Reflex to ID Panel     Status: None (Preliminary result)   Collection Time: 08/19/22  1:10 AM   Specimen: BLOOD  Result Value Ref Range Status   Specimen Description BLOOD RIGHT ARM  Final   Special Requests   Final    BOTTLES DRAWN AEROBIC AND ANAEROBIC Blood Culture adequate volume   Culture   Final    NO GROWTH < 12 HOURS Performed at [facility], [address] [place], [place] [address]    Report Status PENDING  Incomplete  Culture, blood (Routine X 2) w Reflex to ID Panel     Status: None (Preliminary result)   Collection Time: 08/19/22  1:10 AM   Specimen: BLOOD  Result Value Ref Range Status   Specimen Description BLOOD LEFT ARM  Final   Special Requests   Final    BOTTLES DRAWN AEROBIC AND ANAEROBIC Blood Culture results may not be optimal due to an inadequate volume of blood received in culture bottles   Culture   Final    NO GROWTH < 12 HOURS Performed at [facility],  [address] [place], [place] [address]    Report Status PENDING  Incomplete    IMAGING RESULTS:  I have personally reviewed the films ?no infiltrate   Impression/Recommendation Acute hypoxic resp failure- thought to be dueCOPD exacerbation lactic acidosis Acute bronchitis  with mucus plugs n CT Leucocytosis Pt is on ceftriaxone and zithromycin, solumedrol Will check RESp panel PCR/covid test  Encephalopathy Combativeness and agitation post MRI early this morning- so he got haldol and ativan MRI brain no acute findings HTN- r/o PRES  ? HIV- well controlled on genvoya which is TAF+FTC+ elvitegravir and cobicistat Last Vl < 20 and cd4 > 600  As he is 81 yrs may avoid TAF/cobi- will discuss with his HIV provider regarding switching to Dovato( FTC+ Dolutegravir) once stable Will check VL/Cd4  AKI  Prostate Ca - had brachytherapy ? Rectal carcinoma s/o rectosigmoid resection  ?Treated syphilis serofast at low titer Treated latent TB ___________________________________________________  Note:  This document was prepared using Dragon voice recognition software and may include unintentional dictation errors.

## 2022-08-19 NOTE — Progress Notes (Signed)
[facility]  - [place] at [facility]   PATIENT NAME: Charles Daniel    MR#:  [id number]  DATE OF BIRTH:  1941/04/20  SUBJECTIVE:  patient son-in-law Tyrone and granddaughter at bedside. Patient was also seen earlier. Was very combative agitated wanting to go home. 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 Basically was brought in with shortness of breath some altered mental status at home. Patient received Haldol and Ativan for his combativeness/agitation. Much improved however has abdominal thoracic breathing with wheezing. Received breathing treatment, Solu-Medrol, Mag sulfate BP elevated due to agitation/resp distress  VITALS:  Blood pressure (!) 173/92, pulse (!) 106, temperature 98.4 F (36.9 C), resp. rate (!) 24, weight 61.2 kg, SpO2 98 %.  PHYSICAL EXAMINATION:   GENERAL:  82 y.o.-year-old patient with mild to mod acute distress.  LUNGS: Normal breath sounds bilaterally, bilateral ins and exp  wheezing CARDIOVASCULAR: S1, S2 normal. No murmur  tachycardia ABDOMEN: Soft, nontender, nondistended. Bowel sounds present.  EXTREMITIES: No  edema b/l.    NEUROLOGIC: nonfocal  patient is alert intermittent agitation and combativeness.  SKIN: No obvious rash, lesion, or ulcer.   LABORATORY PANEL:  CBC Recent Labs  Lab 08/19/22 0610  WBC 21.4*  HGB 11.7*  HCT 35.4*  PLT 150    Chemistries  Recent Labs  Lab 08/19/22 0233 08/19/22 0610  NA  --  137  K  --  4.9  CL  --  105  CO2  --  22  GLUCOSE  --  154*  BUN  --  21  CREATININE  --  1.42*  CALCIUM  --  8.8*  MG 2.1  --   AST  --  67*  ALT  --  26  ALKPHOS  --  68  BILITOT  --  1.2   Cardiac Enzymes No results for input(s): "TROPONINI" in the last 168 hours. RADIOLOGY:  CT ABDOMEN PELVIS WO CONTRAST  Result Date: 08/19/2022 CLINICAL DATA:  Kidney failure, acute EXAM: CT ABDOMEN AND PELVIS WITHOUT CONTRAST TECHNIQUE: Multidetector CT imaging of the abdomen  and pelvis was performed following the standard protocol without IV contrast. RADIATION DOSE REDUCTION: This exam was performed according to the departmental dose-optimization program which includes automated exposure control, adjustment of the mA and/or kV according to patient size and/or use of iterative reconstruction technique. COMPARISON:  CT abdomen pelvis 08/18/2007, CT abdomen pelvis 10/11/2019 FINDINGS: Lower chest: Trace right pleural effusion.  Tiny hiatal hernia. Hepatobiliary: No focal liver abnormality. No gallstones, gallbladder wall thickening, or pericholecystic fluid. No biliary dilatation. Pancreas: No focal lesion. Normal pancreatic contour. No surrounding inflammatory changes. No main pancreatic ductal dilatation. Spleen: Normal in size without focal abnormality. Adrenals/Urinary Tract: No adrenal nodule bilaterally. No nephrolithiasis and no hydronephrosis. No definite contour-deforming renal mass. No ureterolithiasis or hydroureter. The urinary bladder is unremarkable. Excretion of previously administered intravenous contrast noted within bilateral collecting systems. Stomach/Bowel: Surgical changes related to rectosigmoid resection. Stomach is within normal limits. No evidence of bowel wall thickening or dilatation. Appendix appears normal. Vascular/Lymphatic: No abdominal aorta or iliac aneurysm. Severe atherosclerotic plaque of the aorta and its branches. No abdominal, pelvic, or inguinal lymphadenopathy. Reproductive: Radiation seeds noted along the expected region of the prostate. Other: No intraperitoneal free fluid. No intraperitoneal free gas. No organized fluid collection. Musculoskeletal: No abdominal wall hernia or abnormality. No suspicious lytic or blastic osseous lesions. No acute displaced fracture. Redemonstration of age-indeterminate T11, L1, L2 compression fractures.  IMPRESSION: 1. No acute intra-abdominal or intrapelvic abnormality with limited evaluation on this noncontrast  study. 2. Excretion of previously administered intravenous contrast noted within bilateral collecting systems. 3. Other imaging findings of potential clinical significance: Tiny hiatal hernia. Trace right pleural effusion. Aortic Atherosclerosis (ICD10-I70.0). Electronically Signed   By: Tish Frederickson M.D.   On: 08/19/2022 01:45   MR BRAIN WO CONTRAST  Result Date: 08/19/2022 CLINICAL DATA:  Altered mental status EXAM: MRI HEAD WITHOUT CONTRAST TECHNIQUE: Multiplanar, multiecho pulse sequences of the brain and surrounding structures were obtained without intravenous contrast. COMPARISON:  None Available. FINDINGS: The brain is partially obscured by susceptibility artifacts related to metallic foreign body. There is also patient motion. Brain: No acute infarct, mass effect or extra-axial collection. Chronic microhemorrhage in the right cerebellum. There is confluent hyperintense T2-weighted signal within the white matter. Generalized volume loss. The midline structures are normal. Vascular: Major flow voids are preserved. Skull and upper cervical spine: Normal calvarium and skull base. Visualized upper cervical spine and soft tissues are normal. Sinuses/Orbits:No paranasal sinus fluid levels or advanced mucosal thickening. No mastoid or middle ear effusion. Normal orbits. IMPRESSION: 1. No acute intracranial abnormality. 2. Findings of chronic small vessel ischemia and volume loss. Electronically Signed   By: Deatra Robinson M.D.   On: 08/19/2022 00:56   CT Head Wo Contrast  Result Date: 08/18/2022 CLINICAL DATA:  Mental status change, unknown cause EXAM: CT HEAD WITHOUT CONTRAST TECHNIQUE: Contiguous axial images were obtained from the base of the skull through the vertex without intravenous contrast. RADIATION DOSE REDUCTION: This exam was performed according to the departmental dose-optimization program which includes automated exposure control, adjustment of the mA and/or kV according to patient size  and/or use of iterative reconstruction technique. COMPARISON:  MRI head 05/15/2019, CT head 05/15/2019, PET CT 02/20/2022 FINDINGS: Brain: Patchy and confluent areas of decreased attenuation are noted throughout the deep and periventricular white matter of the cerebral hemispheres bilaterally, compatible with chronic microvascular ischemic disease. Redemonstration of right frontotemporal encephalomalacia. No evidence of large-territorial acute infarction. No parenchymal hemorrhage. No mass lesion. No extra-axial collection. No mass effect or midline shift. No hydrocephalus. Basilar cisterns are patent. Vascular: No hyperdense vessel. Skull: No acute fracture or focal lesion. Similar-appearing metallic density along the left scalp. Sinuses/Orbits: Sphenoid, ethmoid, maxillary sinus mucosal thickening. Paranasal sinuses and mastoid air cells are clear. The orbits are unremarkable. Other: None. IMPRESSION: No acute intracranial abnormality. Electronically Signed   By: Tish Frederickson M.D.   On: 08/18/2022 21:43   CT Angio Chest PE W and/or Wo Contrast  Result Date: 08/18/2022 CLINICAL DATA:  Shortness of breath since last night. History of COPD. PE suspected EXAM: CT ANGIOGRAPHY CHEST WITH CONTRAST TECHNIQUE: Multidetector CT imaging of the chest was performed using the standard protocol during bolus administration of intravenous contrast. Multiplanar CT image reconstructions and MIPs were obtained to evaluate the vascular anatomy. RADIATION DOSE REDUCTION: This exam was performed according to the departmental dose-optimization program which includes automated exposure control, adjustment of the mA and/or kV according to patient size and/or use of iterative reconstruction technique. CONTRAST:  75mL OMNIPAQUE IOHEXOL 350 MG/ML SOLN COMPARISON:  Radiographs 08/18/2022 and report from CT chest 08/18/2007 FINDINGS: Cardiovascular: Satisfactory opacification of the pulmonary arteries to the segmental level. No pulmonary  embolism. No evidence of acute aortic syndrome. Coronary artery and aortic atherosclerotic calcification. No pericardial effusion. Mediastinum/Nodes: Small hiatal hernia. Esophagus is unremarkable. 1.2 cm right hilar node (4/64). Otherwise no thoracic adenopathy. Lungs/Pleura: Bibasilar scarring/atelectasis.  Within the posterolateral left lower lobe there is an area of more nodular consolidation measuring 2.0 x 1.0 cm (5/104). Mild bronchial wall thickening and mucous plugging greatest in the lower lobes. Mild right apical scarring. No pleural effusion or pneumothorax. Upper Abdomen: No acute abnormality. Musculoskeletal: Age-indeterminate compression deformities of T4, T8, T11, L1, and L2. These may be chronic however there is not a good recent comparison to confirm chronicity. No rib fractures. Review of the MIP images confirms the above findings. IMPRESSION: 1. No acute pulmonary embolism. 2. Mild bronchial wall thickening and mucous plugging greatest in the lower lobes. 3. Within the posterolateral left lower lobe there is an area of nodular consolidation measuring 2.0 x 1.0 cm. This may be related to chronic scarring however follow-up is recommended to ensure stability. Consider one of the following in 3 months for both low-risk and high-risk individuals: (a) repeat chest CT, (b) follow-up PET-CT, or (c) tissue sampling. This recommendation follows the consensus statement: Guidelines for Management of Incidental Pulmonary Nodules Detected on CT Images: From the Fleischner Society 2017; Radiology 2017; 284:228-243. 4. Age-indeterminate compression deformities of T4, T8, T11, L1, and L2. These may be chronic however there is not a good recent comparison to confirm chronicity. Correlate with areas of pain and consider dedicated workup when clinically appropriate. Aortic Atherosclerosis (ICD10-I70.0). Electronically Signed   By: Minerva Fester M.D.   On: 08/18/2022 19:46   DG Chest 2 View  Result Date:  08/18/2022 CLINICAL DATA:  Shortness of breath EXAM: CHEST - 2 VIEW COMPARISON:  X-ray 05/29/2021 FINDINGS: Underinflation. No consolidation, pneumothorax or effusion. No edema. Normal cardiopericardial silhouette with calcified and tortuous aorta. There are some compression deformities of the mid and lower thoracic spine on the lateral view which are new from 2023. Etiology is uncertain. Please correlate for any known history or dedicated workup when appropriate. Patient does have a history of prostate cancer. IMPRESSION: Underinflation.  No consolidation. There is slight compression of mid and lower thoracic spine vertebral levels which was not seen on x-ray of January 2023. etiology is uncertain. Dedicated workup when clinically appropriate Electronically Signed   By: Karen Kays M.D.   On: 08/18/2022 17:29    Assessment and Plan  Grae Cannata is a 82 y.o. male with a history of hypertension, stroke, emphysema, HIV, tuberculosis who comes ED complaining of generalized weakness, shortness of breath with walking, worse for the past 2 days.  Started after the patient was mowing grass 4 days ago   Acute on chronic hypoxic respiratory failure secondary to COPD exacerbation history of emphysema history of smoking appears remote -- came in with increasing shortness of breath after moving grass four days ago. No fever. Per history patient does not use oxygen at home. Has history of smoking. -- Significant bronchospasm. -- On IV Solu-Medrol BID, bronchodilator, nebulizer -- ABG looks stable -- pulmonary consultation with Dr.aleskerov  -- received dose of magnesium sulfate -- watch for aspiration. Speech therapy to see patient  Severe sepsis suspected due to bronchitis as noted on CT chest -- came in with shortness of breath, tachycardia, elevated white count, abnormal CT chest -- continue broad-spectrum antibiotic -- monitor white count -- received IV fluids. Lactic acid trending down. Watch for  pulmonary edema  Acute metabolic encephalopathy in the setting of hypoxia and sepsis -- patient was quite combative/agitated. Received Ativan and Haldol -- MRI brain negative -- PRN Haldol  Malignant hypertension -- BP elevated in the setting of combativeness/agitation -- IV hydralazine, IV  labetalol PRN till patient able to take oral meds -- at home patient on amlodipine, lisinopril/hydrochlorothiazide will resume once patient able to take oral  At risk for aspiration -- speech therapy to see patient  History of HIV -- patient on antiretroviral meds will resume once able to take PO  Acute renal failure in the setting of sepsis -- baseline creatinine 1.09 -- came in with creatinine of 1.42 -- will give IV fluids and avoid nephrotoxic agents   Procedures: Family communication : spoke with son-in-law Tyrone at bedside, daughter [name] on the phone, granddaughter Consults : pulmonary CODE STATUS: full DVT Prophylaxis : Level of care: Med-Surg Status is: Inpatient Remains inpatient appropriate because: severe sepsis    TOTAL critical TIME TAKING CARE OF THIS PATIENT: 45 minutes.  >50% time spent on counselling and coordination of care  Note: This dictation was prepared with Dragon dictation along with smaller phrase technology. Any transcriptional errors that result from this process are unintentional.  Enedina Finner M.D    [facility]   CC: Primary care physician; [name], MD

## 2022-08-19 NOTE — Evaluation (Signed)
Clinical/Bedside Swallow Evaluation Patient Details  Name: Charles Daniel MRN: [id number] Date of Birth: 1940/08/18  Today's Date: 08/19/2022 Time: SLP Start Time (ACUTE ONLY): 1220 SLP Stop Time (ACUTE ONLY): 1310 SLP Time Calculation (min) (ACUTE ONLY): 50 min  Past Medical History:  Past Medical History:  Diagnosis Date   Bilateral hydrocele 2012   Depression    Diverticulosis 2013   by colonoscopy   Emphysema lung 03/2014    by CXR, remote smoking history   History of colon cancer    2007 --  S/P RECTOSIGMOID COLECTOMY--  NO CHEMORADIATION--  NO RECURRENCE   History of CVA (cerebrovascular accident)    2003-  RIGHT MIDDLE CVA---   NO RESIDUAL   History of hepatitis B    REMOTE AND INACTIVE  PER DOCUMENTATION   History of syphilis    SECONDARY SYPHILITIS TX'D IN 1998  PER DOCUMENTATION   History of tuberculosis    LATENT TB  TX'D X12  MONTHS IN 1995   HIV infection    DX 1995--  MONITORED BY INFECTIOUS DISEASE (DR CAMPBELL)   Hypertension    Prostate carcinoma dx 09/2012   T1c, brachytherapy/seed implant Estelle June)   Past Surgical History:  Past Surgical History:  Procedure Laterality Date   COLONOSCOPY  08/2011   3 polyps, diverticulosis, rec rpt 5 yrs Arlyce Dice)   COLONOSCOPY  10/2016   7 TAs, diverticulosis, no rpt recommended ([name])   INGUINAL HERNIA REPAIR  1994   UNILATERAL   LOW ANTERIOR RESECTION RECTOSIGMOID COLON  03-09-2006   PROSTATE BIOPSY  09/2012   53cc  (MD OFFICE)   RADIOACTIVE SEED IMPLANT N/A 01/20/2013   Procedure: RADIOACTIVE SEED IMPLANT;  Surgeon: Garnett Farm, MD;  as well as Rose Phi MD   TRANSTHORACIC ECHOCARDIOGRAM  11-16-2001   LV WALL THICKNESS MODERATELY INCREASED/  EF 55-65%/ LVSF NORMAL   HPI:  Pt is a 82 y.o. male with a history of hypertension, stroke, emphysema, HIV, tuberculosis who comes ED complaining of generalized weakness, shortness of breath with walking, worse for the past 2 days. On Several occasions, pt has been  combative and aggressive w/ Staff including this morning; sedating medications given.  CT Imaging of chest since admit: No acute pulmonary embolism.  2. Mild bronchial wall thickening and mucous plugging greatest in  the lower lobes.  3. Within the posterolateral left lower lobe there is an area of  nodular consolidation measuring 2.0 x 1.0 cm.; trace right pleural effusion and small hiatal hernia.   Unsure of Baseline Cognitive status but pt was mowing grass ~2 days ago per Family member.    Assessment / Plan / Recommendation  Clinical Impression   Pt seen for BSE today. Pt awakened to verbal/tactile stim. Confused, muttered/mumbled speech often. Was oriented to self only. Constantly pulling at Bilat. Mitts. Sitter present. Noted abdominal thoracic breathing w/ wheezing at Baseline upon entering room and when moving about in bed -- NSG/MD aware. Pt was agitated this morning and given sedating medications to calm per chart notes(this has been ongoing since admit).  Pt on [place] O2 support of 2L; afebrile. WBC elevated.  Pt appears to presents w/ significant concern for oropharyngeal phase dysphagia in setting of declined Cognitive/Mental status w/ agitation requiring sedating medication per chart notes/NSG. Pt also has declined Pulmonary status c/b abdominal thoracic breathing moreso when moving about and pulling at Mitts. ANY Pulmonary and/or Cognitive decline can impact overall awareness/timing of swallowing and safety during po tasks which increases risk for  aspiration, choking.  Pt's risk for aspiration can be reduced when following aspiration precautions, given feeding support and Supervision w/ oral intake, and when using a modified diet consistency requiring less bolus management work/mastication -- conservation of energy. Pt is also only wearing the Upper Denture plate, Edentulous on bottom. He required Mod-Max verbal/visual/tactile cues for follow through during po tasks and was unable to self-feed d/t  the Bilat. Mitts.        Pt consumed several trials of ice chips(3), purees, and Nectar consistency liquids via spoon/straw w/ No overt, clinical s/s of aspiration noted: no decline in vocal quality; no cough, and no decline in respiratory status during/post trials from his Baseline presentation. Rest Breaks were given to allow calmer breathing b/t trials. O2 sats remained 99-100% t/o. Oral phase was functional for bolus management and oral clearing of the boluses given. No solids foods attempted d/t pt's decreased attention/alertness and declined Pulmonary status.  OM Exam was cursory d/t being unable to follow commands. No unilateral lingual/labial weakness noted; protrusion lingual strength noted during SLP's finger sweep of mouth. Edentulous on bottom. Min increased phlegm present. Confusion of OM tasks and oral care noted including biting of swab and tongue blade.          In setting of current presentation and risk for aspiration, recommend initiation of the dysphagia level 1(PUREED foods moistened for ease of oral phase) w/ Nectar liquids; aspiration precautions; reduce Distractions during meals and engage pt for self-feeding during meals -- hold cup to drink. Pills Crushed in Puree for safer swallowing as needed. Support w/ feeding at meals; Rest Breaks to calm breathing; conservation of energy. MD/NSG updated.  ST services will f/u w/ toleration of diet and trials to upgrade diet when medically appropriate. Recommend Dietician f/u for support. Precautions posted in room. SLP Visit Diagnosis: Dysphagia, oropharyngeal phase (R13.12) (declined Cognitive awareness; confusion++; Pulmonary and medical status' decline)    Aspiration Risk  Moderate aspiration risk;Risk for inadequate nutrition/hydration    Diet Recommendation   dysphagia level 1(PUREED foods moistened for ease of oral phase) w/ Nectar liquids; aspiration precautions; reduce Distractions during meals and engage pt for self-feeding during  meals -- hold cup to drink. Support w/ feeding at meals; Rest Breaks to calm breathing; conservation of energy.   Medication Administration: Crushed with puree    Other  Recommendations Recommended Consults:  (Dietician f/u) Oral Care Recommendations: Oral care BID;Oral care before and after PO;Staff/trained caregiver to provide oral care Caregiver Recommendations: Avoid jello, ice cream, thin soups, popsicles;Remove water pitcher;Have oral suction available    Recommendations for follow up therapy are one component of a multi-disciplinary discharge planning process, led by the attending physician.  Recommendations may be updated based on patient status, additional functional criteria and insurance authorization.  Follow up Recommendations Follow physician's recommendations for discharge plan and follow up therapies      Assistance Recommended at Discharge  FULL d/t Confusion present  Functional Status Assessment  (TBD)  Frequency and Duration min 2x/week  2 weeks       Prognosis Prognosis for improved oropharyngeal function: Fair Barriers to Reach Goals: Cognitive deficits;Time post onset;Severity of deficits;Behavior Barriers/Prognosis Comment: confusion, agitation      Swallow Study   General Date of Onset: 08/18/22 HPI: Pt is a 82 y.o. male with a history of hypertension, stroke, emphysema, HIV, tuberculosis who comes ED complaining of generalized weakness, shortness of breath with walking, worse for the past 2 days. On Several occasions, pt has been  combative and aggressive w/ Staff including this morning; sedating medications given.  CT Imaging of chest since admit: No acute pulmonary embolism.  2. Mild bronchial wall thickening and mucous plugging greatest in  the lower lobes.  3. Within the posterolateral left lower lobe there is an area of  nodular consolidation measuring 2.0 x 1.0 cm.; trace right pleural effusion and small hiatal hernia.   Unsure of Baseline Cognitive status  but pt was mowing grass ~2 days ago per Family member. Type of Study: Bedside Swallow Evaluation Previous Swallow Assessment: none Diet Prior to this Study: NPO (except sips w/ meds w/ NSG) Temperature Spikes Noted: No (WBC elevated) Respiratory Status: Nasal cannula (2L) History of Recent Intubation: No Behavior/Cognition: Alert;Cooperative;Pleasant mood;Confused;Distractible;Requires cueing;Doesn't follow directions Oral Cavity Assessment: Excessive secretions (phlegm) Oral Care Completed by SLP: Yes (biting on swab) Oral Cavity - Dentition: Dentures, top (edentulous on bottom) Vision:  (n/a) Self-Feeding Abilities: Total assist Patient Positioning: Upright in bed (needed full positioning) Baseline Vocal Quality: Low vocal intensity (but functional) Volitional Cough: Cognitively unable to elicit Volitional Swallow: Unable to elicit    Oral/Motor/Sensory Function Overall Oral Motor/Sensory Function:  (no unilateral OM weakness noted; biting on tongue blade and swab)   Ice Chips Ice chips: Impaired Presentation: Spoon (fed; 3 trials) Oral Phase Impairments: Poor awareness of bolus Pharyngeal Phase Impairments:  (none)   Thin Liquid Thin Liquid: Not tested    Nectar Thick Nectar Thick Liquid: Within functional limits Presentation: Spoon;Straw (fed; 2 trials via spoon; ~3 ozs via straw) Other Comments: no change in his abdominal breathing during/post trials. Rest breaks given   Honey Thick Honey Thick Liquid: Not tested   Puree Puree: Within functional limits Presentation: Spoon (fed; 10 trials) Other Comments: similar presentation as w/ Nectar liquids   Solid     Solid: Not tested         Jerilynn Som, MS, CCC-SLP Speech Language Pathologist Rehab Services; [facility] [place] [phone] (ascom) Emmary Culbreath 08/19/2022,1:32 PM

## 2022-08-19 NOTE — Assessment & Plan Note (Signed)
Suspect from type b due to ART meds  We will monitor and provide supportive care.  ABG shows normal PH.  We will start pt on LR which also may help the acidosis although no proven treatment except for stopping the meds.   08/18/22 21:05 08/18/22 23:35  Lactic Acid, Venous 4.4 (HH) 5.8 (HH)  high

## 2022-08-19 NOTE — Assessment & Plan Note (Signed)
Vitals:   08/18/22 1618 08/18/22 1800 08/18/22 1900 08/18/22 2000  BP: (!) 158/92 (!) 162/97 (!) 176/94 (!) 181/90   08/18/22 2100 08/19/22 0105  BP: (!) 197/103 (!) 191/99  PRN hydralazine.  Cont amlodipine. Ct negative for cva and MRI negative for any acute cva.

## 2022-08-19 NOTE — ED Notes (Signed)
Pt. Attempting to get out of bed, pull off cardiac monitoring and pull out IV. Mitts applied.

## 2022-08-19 NOTE — ED Notes (Signed)
Dr. Patel at bedside 

## 2022-08-19 NOTE — ED Notes (Signed)
Post ativan, pt's breathing is labored and wheezy, O sat is 99%. Pt using accesory muscles. Dr. Allena Katz notified.

## 2022-08-20 ENCOUNTER — Other Ambulatory Visit (HOSPITAL_COMMUNITY): Payer: Self-pay

## 2022-08-20 DIAGNOSIS — B20 Human immunodeficiency virus [HIV] disease: Secondary | ICD-10-CM | POA: Diagnosis not present

## 2022-08-20 DIAGNOSIS — J441 Chronic obstructive pulmonary disease with (acute) exacerbation: Secondary | ICD-10-CM

## 2022-08-20 DIAGNOSIS — R0602 Shortness of breath: Secondary | ICD-10-CM | POA: Diagnosis not present

## 2022-08-20 DIAGNOSIS — J9601 Acute respiratory failure with hypoxia: Secondary | ICD-10-CM | POA: Diagnosis not present

## 2022-08-20 LAB — HELPER T-LYMPH-CD4 (ARMC ONLY)
% CD 4 Pos. Lymph.: 35 % (ref 30.8–58.5)
Absolute CD 4 Helper: 385 /uL (ref 359–1519)
Basophils Absolute: 0 10*3/uL (ref 0.0–0.2)
Basos: 0 %
EOS (ABSOLUTE): 0 10*3/uL (ref 0.0–0.4)
Eos: 0 %
Hematocrit: 35.1 % — ABNORMAL LOW (ref 37.5–51.0)
Hemoglobin: 11.6 g/dL — ABNORMAL LOW (ref 13.0–17.7)
Immature Grans (Abs): 0.4 10*3/uL — ABNORMAL HIGH (ref 0.0–0.1)
Immature Granulocytes: 2 %
Lymphocytes Absolute: 1.1 10*3/uL (ref 0.7–3.1)
Lymphs: 5 %
MCH: 28.9 pg (ref 26.6–33.0)
MCHC: 33 g/dL (ref 31.5–35.7)
MCV: 88 fL (ref 79–97)
Monocytes Absolute: 0.4 10*3/uL (ref 0.1–0.9)
Monocytes: 2 %
Neutrophils Absolute: 19 10*3/uL — ABNORMAL HIGH (ref 1.4–7.0)
Neutrophils: 91 %
Platelets: 172 10*3/uL (ref 150–450)
RBC: 4.01 x10E6/uL — ABNORMAL LOW (ref 4.14–5.80)
RDW: 14.7 % (ref 11.6–15.4)
WBC: 20.9 10*3/uL — ABNORMAL HIGH (ref 3.4–10.8)

## 2022-08-20 LAB — CULTURE, BLOOD (ROUTINE X 2)

## 2022-08-20 LAB — RPR
RPR Ser Ql: REACTIVE — AB
RPR Titer: 1:2 {titer}

## 2022-08-20 MED ORDER — SODIUM CHLORIDE 0.9 % IV SOLN: INTRAVENOUS | Status: DC | PRN

## 2022-08-20 MED ORDER — LISINOPRIL 20 MG PO TABS
20.0000 mg | ORAL_TABLET | Freq: Every day | ORAL | Status: DC
  Administered 2022-08-20 – 2022-08-21 (×2): 20 mg via ORAL
  Filled 2022-08-20 (×3): qty 1

## 2022-08-20 MED ORDER — HYDROCHLOROTHIAZIDE 12.5 MG PO TABS
12.5000 mg | ORAL_TABLET | Freq: Every day | ORAL | Status: DC
  Administered 2022-08-20 – 2022-08-22 (×3): 12.5 mg via ORAL
  Filled 2022-08-20 (×3): qty 1

## 2022-08-20 MED ORDER — AMLODIPINE BESYLATE 10 MG PO TABS
10.0000 mg | ORAL_TABLET | Freq: Every day | ORAL | Status: DC
  Administered 2022-08-21 – 2022-08-22 (×2): 10 mg via ORAL
  Filled 2022-08-20 (×2): qty 1

## 2022-08-20 MED ORDER — HYDRALAZINE HCL 25 MG PO TABS
25.0000 mg | ORAL_TABLET | Freq: Three times a day (TID) | ORAL | Status: DC
  Administered 2022-08-20: 25 mg via ORAL
  Filled 2022-08-20: qty 1

## 2022-08-20 MED ORDER — DOLUTEGRAVIR-LAMIVUDINE 50-300 MG PO TABS
1.0000 | ORAL_TABLET | Freq: Every day | ORAL | Status: DC
  Administered 2022-08-21: 1 via ORAL
  Filled 2022-08-20: qty 1

## 2022-08-20 MED ORDER — BUDESONIDE 0.25 MG/2ML IN SUSP
0.2500 mg | Freq: Two times a day (BID) | RESPIRATORY_TRACT | Status: DC
  Administered 2022-08-20 – 2022-08-22 (×5): 0.25 mg via RESPIRATORY_TRACT
  Filled 2022-08-20 (×5): qty 2

## 2022-08-20 MED ORDER — LISINOPRIL-HYDROCHLOROTHIAZIDE 20-12.5 MG PO TABS: 1.0000 | ORAL_TABLET | Freq: Every day | ORAL | Status: DC

## 2022-08-20 NOTE — Progress Notes (Addendum)
Date of Admission:  08/18/2022      ID: Charles Daniel is a 82 y.o. male Principal Problem:   SOB (shortness of breath) Active Problems:   Human immunodeficiency virus (HIV) disease   SYPHILIS   HTN (hypertension), malignant   COPD with emphysema   History of ischemic stroke   Tobacco use   CKD (chronic kidney disease) stage 3, GFR 30-59 ml/min   Dizziness   Severe sepsis   Acute lactic acidosis   Sepsis   Acute on chronic respiratory failure with hypoxia   AKI (acute kidney injury)   Acute hypoxemic respiratory failure    Subjective: Pt is more alert today Says he has had some cough, wheezing and also rt shoulder pain He has stopped taking Genvoya for the past month or two- his last prescription was in NOV On talking to his HIV provider's pharmacist, [facility] had called him as he did not have insurance to cover his meds- HE was asked to go to [facility] to enroll in [place]. But patient had told them that he had insurance and was getting meds form [facility] which he was not Pt says as he had  undetectable VL he did not refill prescription  Medications:   [START ON 08/21/2022] amLODipine  10 mg Oral Daily   atorvastatin  40 mg Oral Daily   budesonide (PULMICORT) nebulizer solution  0.25 mg Nebulization BID   cholecalciferol  1,000 Units Oral Daily   heparin  5,000 Units Subcutaneous Q8H   hydrochlorothiazide  12.5 mg Oral Daily   lisinopril  20 mg Oral Daily   methylPREDNISolone (SOLU-MEDROL) injection  40 mg Intravenous Q12H   sodium chloride flush  3 mL Intravenous Q12H    Objective: Vital signs in last 24 hours: Patient Vitals for the past 24 hrs:  BP Temp Temp src Pulse Resp SpO2 Weight  08/20/22 1953 (!) 160/84 (!) 95.5 F (35.3 C) -- 93 18 98 % --  08/20/22 1609 (!) 152/88 97.9 F (36.6 C) Oral (!) 101 20 98 % --  08/20/22 1145 (!) 157/85 98 F (36.7 C) Oral 96 (!) 22 99 % --  08/20/22 1038 (!) 165/86 (!) 97.4 F (36.3 C) Oral 99 (!) 26 99 % --  08/20/22 0405 (!) 169/70 97.7  F (36.5 C) -- 91 20 -- --  08/20/22 0404 (!) 177/73 97.7 F (36.5 C) -- (!) 117 20 95 % --  08/20/22 0315 -- -- -- -- -- -- 63.3 kg  08/19/22 2325 (!) 155/88 98.4 F (36.9 C) -- 88 20 100 % --      PHYSICAL EXAM:  General: Alert, cooperative, no distress, he is oriented in place, person year, situation but not to month or day, wheezing heard Head: Normocephalic, without obvious abnormality, atraumatic. Eyes: Conjunctivae clear, anicteric sclerae. Pupils are equal ENT Nares normal. No drainage or sinus tenderness. Lips, mucosa, and tongue normal. No Thrush Neck: Supple, symmetrical, no adenopathy, thyroid: non tender no carotid bruit and no JVD. Back: No CVA tenderness. Lungs: b/l rhonchi Heart: s1s2 Abdomen: Soft, non-tender,not distended. Bowel sounds normal. No masses Extremities: rt shoulder no swelling or eryhtema- He was not able to lift the arm actively but I was able to lift it completley- after which he was able to lift up as well Skin: No rashes or lesions. Or bruising Lymph: Cervical, supraclavicular normal. Neurologic: Grossly non-focal  Lab Results    Latest Ref Rng & Units 08/19/2022    6:10 AM 08/18/2022    4:21  PM 03/18/2022    8:29 AM  CBC  WBC 4.0 - 10.5 K/uL 3.4 - 10.8 x10E3/uL 21.4    20.9  20.0  6.7   Hemoglobin 13.0 - 17.0 g/dL 45.4 - 09.8 g/dL 11.9    14.7  82.9  56.2   Hematocrit 39.0 - 52.0 % 37.5 - 51.0 % 35.4    35.1  41.9  44.3   Platelets 150 - 400 K/uL 150 - 450 x10E3/uL 150    172  182  256        Latest Ref Rng & Units 08/19/2022    6:10 AM 08/18/2022    4:21 PM 03/18/2022    8:29 AM  CMP  Glucose 70 - 99 mg/dL 130  865  95   BUN 8 - 23 mg/dL 21  15  11    Creatinine 0.61 - 1.24 mg/dL 7.84  6.96  2.95   Sodium 135 - 145 mmol/L 137  135  138   Potassium 3.5 - 5.1 mmol/L 4.9  4.4  4.1   Chloride 98 - 111 mmol/L 105  102  101   CO2 22 - 32 mmol/L 22  22  30    Calcium 8.9 - 10.3 mg/dL 8.8  9.2  9.8   Total Protein 6.5 - 8.1 g/dL 7.1   7.8  7.8   Total Bilirubin 0.3 - 1.2 mg/dL 1.2  1.0  0.4   Alkaline Phos 38 - 126 U/L 68  84    AST 15 - 41 U/L 67  53  25   ALT 0 - 44 U/L 26  27  21        Microbiology: 08/19/22 NG Resp panel neg  Studies/Results: DG Chest Port 1 View  Result Date: 08/19/2022 CLINICAL DATA:  Bronchospasm EXAM: PORTABLE CHEST 1 VIEW COMPARISON:  X-ray 08/18/2022 and older FINDINGS: No consolidation, pneumothorax or effusion. No edema. Tortuous and ectatic aorta. Normal cardiopericardial silhouette. Overlapping cardiac leads. Film is rotated to the left. Eventration of the right hemidiaphragm IMPRESSION: No acute cardiopulmonary disease Electronically Signed   By: Karen Kays M.D.   On: 08/19/2022 15:09   CT ABDOMEN PELVIS WO CONTRAST  Result Date: 08/19/2022 CLINICAL DATA:  Kidney failure, acute EXAM: CT ABDOMEN AND PELVIS WITHOUT CONTRAST TECHNIQUE: Multidetector CT imaging of the abdomen and pelvis was performed following the standard protocol without IV contrast. RADIATION DOSE REDUCTION: This exam was performed according to the departmental dose-optimization program which includes automated exposure control, adjustment of the mA and/or kV according to patient size and/or use of iterative reconstruction technique. COMPARISON:  CT abdomen pelvis 08/18/2007, CT abdomen pelvis 10/11/2019 FINDINGS: Lower chest: Trace right pleural effusion.  Tiny hiatal hernia. Hepatobiliary: No focal liver abnormality. No gallstones, gallbladder wall thickening, or pericholecystic fluid. No biliary dilatation. Pancreas: No focal lesion. Normal pancreatic contour. No surrounding inflammatory changes. No main pancreatic ductal dilatation. Spleen: Normal in size without focal abnormality. Adrenals/Urinary Tract: No adrenal nodule bilaterally. No nephrolithiasis and no hydronephrosis. No definite contour-deforming renal mass. No ureterolithiasis or hydroureter. The urinary bladder is unremarkable. Excretion of previously administered  intravenous contrast noted within bilateral collecting systems. Stomach/Bowel: Surgical changes related to rectosigmoid resection. Stomach is within normal limits. No evidence of bowel wall thickening or dilatation. Appendix appears normal. Vascular/Lymphatic: No abdominal aorta or iliac aneurysm. Severe atherosclerotic plaque of the aorta and its branches. No abdominal, pelvic, or inguinal lymphadenopathy. Reproductive: Radiation seeds noted along the expected region of the prostate. Other: No intraperitoneal free fluid. No intraperitoneal  free gas. No organized fluid collection. Musculoskeletal: No abdominal wall hernia or abnormality. No suspicious lytic or blastic osseous lesions. No acute displaced fracture. Redemonstration of age-indeterminate T11, L1, L2 compression fractures. IMPRESSION: 1. No acute intra-abdominal or intrapelvic abnormality with limited evaluation on this noncontrast study. 2. Excretion of previously administered intravenous contrast noted within bilateral collecting systems. 3. Other imaging findings of potential clinical significance: Tiny hiatal hernia. Trace right pleural effusion. Aortic Atherosclerosis (ICD10-I70.0). Electronically Signed   By: Tish Frederickson M.D.   On: 08/19/2022 01:45   MR BRAIN WO CONTRAST  Result Date: 08/19/2022 CLINICAL DATA:  Altered mental status EXAM: MRI HEAD WITHOUT CONTRAST TECHNIQUE: Multiplanar, multiecho pulse sequences of the brain and surrounding structures were obtained without intravenous contrast. COMPARISON:  None Available. FINDINGS: The brain is partially obscured by susceptibility artifacts related to metallic foreign body. There is also patient motion. Brain: No acute infarct, mass effect or extra-axial collection. Chronic microhemorrhage in the right cerebellum. There is confluent hyperintense T2-weighted signal within the white matter. Generalized volume loss. The midline structures are normal. Vascular: Major flow voids are preserved.  Skull and upper cervical spine: Normal calvarium and skull base. Visualized upper cervical spine and soft tissues are normal. Sinuses/Orbits:No paranasal sinus fluid levels or advanced mucosal thickening. No mastoid or middle ear effusion. Normal orbits. IMPRESSION: 1. No acute intracranial abnormality. 2. Findings of chronic small vessel ischemia and volume loss. Electronically Signed   By: Deatra Robinson M.D.   On: 08/19/2022 00:56   CT Head Wo Contrast  Result Date: 08/18/2022 CLINICAL DATA:  Mental status change, unknown cause EXAM: CT HEAD WITHOUT CONTRAST TECHNIQUE: Contiguous axial images were obtained from the base of the skull through the vertex without intravenous contrast. RADIATION DOSE REDUCTION: This exam was performed according to the departmental dose-optimization program which includes automated exposure control, adjustment of the mA and/or kV according to patient size and/or use of iterative reconstruction technique. COMPARISON:  MRI head 05/15/2019, CT head 05/15/2019, PET CT 02/20/2022 FINDINGS: Brain: Patchy and confluent areas of decreased attenuation are noted throughout the deep and periventricular white matter of the cerebral hemispheres bilaterally, compatible with chronic microvascular ischemic disease. Redemonstration of right frontotemporal encephalomalacia. No evidence of large-territorial acute infarction. No parenchymal hemorrhage. No mass lesion. No extra-axial collection. No mass effect or midline shift. No hydrocephalus. Basilar cisterns are patent. Vascular: No hyperdense vessel. Skull: No acute fracture or focal lesion. Similar-appearing metallic density along the left scalp. Sinuses/Orbits: Sphenoid, ethmoid, maxillary sinus mucosal thickening. Paranasal sinuses and mastoid air cells are clear. The orbits are unremarkable. Other: None. IMPRESSION: No acute intracranial abnormality. Electronically Signed   By: Tish Frederickson M.D.   On: 08/18/2022 21:43      Assessment/Plan: Acute on chronic hypoxic resp failure secondary to COPD exacerbation RESP panel/covid negative On IV solumedrol Leucocytosis /lactate disproportionate to cxr findings Recommend CT chest  Rt shoulder pain- no obvious findings- observe closely -if it worsens may need imaging ( MRI?)  HIV - in Nov he had undetectable VL and cd4> 600- he was on genvoya He stopped taking HAART due to misunderstanding and lack of insurance Cd4 from 08/19/22 is 385 ( 35%) Vl pending Will start Dovato ( 3TC + dolutegravir) instead of genvoya in order to avoid TAF Looks like he was on atripla and then genvoya- do not see any resisatnce test- will send genosure prime tomorrow and start Dovato  Encephalopathy resolved MRI brain no acute findings HTN- no  PRES  AKI  Prostate Ca - had brachytherapy ? Rectal carcinoma s/o rectosigmoid resection   Treated syphilis serofast at low titer Treated latent TB  Discussed the management with the patient and pharmacist HE will need to enroll in [facility] at [facility] as he has no insurance for medication Dr.Comer available by phone for urgent issues for the next three days

## 2022-08-20 NOTE — Evaluation (Signed)
Occupational Therapy Evaluation Patient Details Name: Charles Daniel MRN: [id number] DOB: 01/02/1941 Today's Date: 08/20/2022   History of Present Illness Pt is an 82 y.o. male presenting to hospital 08/18/22 with c/o SOB with walking and generalized weakness.  Pt admitted with SOB, acute lactic acidosis, severe sepsis, dizziness, CKD, and COPD with emphysema.  PMH includes htn, stroke, emphysema, HIV, tuberculosis, colon CA, prostate CA, h/o syphillis.   Clinical Impression   Charles Daniel received in room, just finishing up with PT session. He endorses feeling tired, is visibly SOB, O2 sats at 98% on 2 L O2. Pt agreeable to additional ambulation, trials use of RW and reports that he feels much steadier using this AD. Pt does have 1 significant LOB while ambulating, further walking deferred. Pt requires Min A for sit<supine transfer. Pt reports he lives in a single-story home, no STE, with his wife and has family members who visit frequently. Pt largely able to perform BADL indly, family assists with IADL. Pt denies falls history. Pt appears debilitated at present, tires quickly with OOB activity. Recommend OT during hospitalization, with educ re: HEP and ECS, and HHOT post discharge.    Recommendations for follow up therapy are one component of a multi-disciplinary discharge planning process, led by the attending physician.  Recommendations may be updated based on patient status, additional functional criteria and insurance authorization.   Assistance Recommended at Discharge Intermittent Supervision/Assistance  Patient can return home with the following A little help with bathing/dressing/bathroom;Assistance with cooking/housework;Assist for transportation    Functional Status Assessment  Patient has had a recent decline in their functional status and demonstrates the ability to make significant improvements in function in a reasonable and predictable amount of time.  Equipment Recommendations   Other (comment) (RW)    Recommendations for Other Services       Precautions / Restrictions Precautions Precautions: Fall Restrictions Weight Bearing Restrictions: No      Mobility Bed Mobility Overal bed mobility: Needs Assistance Bed Mobility: Supine to Sit, Sit to Supine     Supine to sit: Supervision Sit to supine: Min assist   General bed mobility comments: Pt requires verbal cueing, Min A to reposition towards HOB. Min A for elevating LE to bed level    Transfers Overall transfer level: Needs assistance Equipment used: Rolling walker (2 wheels) Transfers: Sit to/from Stand Sit to Stand: Min guard                  Balance Overall balance assessment: Needs assistance Sitting-balance support: No upper extremity supported, Feet supported Sitting balance-Leahy Scale: Good     Standing balance support: Bilateral upper extremity supported, During functional activity, Reliant on assistive device for balance Standing balance-Leahy Scale: Good Standing balance comment: balance improved with use of RW                           ADL either performed or assessed with clinical judgement   ADL Overall ADL's : Needs assistance/impaired     Grooming: Wash/dry hands;Wash/dry face;Sitting           Upper Body Dressing : Minimal assistance                           Vision         Perception     Praxis      Pertinent Vitals/Pain Pain Assessment Pain Assessment: No/denies pain  Hand Dominance     Extremity/Trunk Assessment Upper Extremity Assessment Upper Extremity Assessment: Generalized weakness   Lower Extremity Assessment Lower Extremity Assessment: Generalized weakness   Cervical / Trunk Assessment Cervical / Trunk Assessment: Normal   Communication Communication Communication: No difficulties   Cognition Arousal/Alertness: Awake/alert Behavior During Therapy: WFL for tasks assessed/performed Overall Cognitive  Status: Within Functional Limits for tasks assessed                                 General Comments: pleasant, friendly     General Comments       Exercises Other Exercises Other Exercises: Educ re: HEP, safe use of DME, ECS   Shoulder Instructions      Home Living Family/patient expects to be discharged to:: Private residence Living Arrangements: Spouse/significant other Available Help at Discharge: Family;Available 24 hours/day Type of Home: House Home Access: Level entry     Home Layout: One level     Bathroom Shower/Tub: Chief Strategy Officer: Standard     Home Equipment: None          Prior Functioning/Environment Prior Level of Function : Independent/Modified Independent             Mobility Comments: No recent falls reported, ambulates without AD ADLs Comments: Generally able to perform BADL with Mod I. Wife does cooking, family assists with shopping, transportation. Pt does help with laundry and light housecleaning.        OT Problem List: Decreased strength;Decreased activity tolerance;Impaired balance (sitting and/or standing);Cardiopulmonary status limiting activity      OT Treatment/Interventions: Self-care/ADL training;Therapeutic exercise;Patient/family education;Balance training;Energy conservation;Therapeutic activities;DME and/or AE instruction    OT Goals(Current goals can be found in the care plan section) Acute Rehab OT Goals Patient Stated Goal: to go home OT Goal Formulation: With patient Time For Goal Achievement: 09/03/22 Potential to Achieve Goals: Good ADL Goals Pt Will Perform Grooming: with modified independence;standing Pt Will Transfer to Toilet: with supervision;ambulating;regular height toilet Pt/caregiver will Perform Home Exercise Program: Increased ROM;Increased strength (to increase strength, endurance, cardiovascular capacity)  OT Frequency: Min 2X/week    Co-evaluation               AM-PAC OT "6 Clicks" Daily Activity     Outcome Measure Help from another person eating meals?: None Help from another person taking care of personal grooming?: A Little Help from another person toileting, which includes using toliet, bedpan, or urinal?: A Little Help from another person bathing (including washing, rinsing, drying)?: A Lot Help from another person to put on and taking off regular upper body clothing?: A Little Help from another person to put on and taking off regular lower body clothing?: A Little 6 Click Score: 18   End of Session Equipment Utilized During Treatment: Rolling walker (2 wheels)  Activity Tolerance: Patient tolerated treatment well Patient left: in bed;with call bell/phone within reach;with bed alarm set;with family/visitor present  OT Visit Diagnosis: Unsteadiness on feet (R26.81);Muscle weakness (generalized) (M62.81)                Time: 8416-6063 OT Time Calculation (min): 23 min Charges:  OT General Charges $OT Visit: 1 Visit OT Evaluation $OT Eval Low Complexity: 1 Low OT Treatments $Self Care/Home Management : 8-22 mins Latina Craver, PhD, MS, OTR/L 08/20/22, 4:05 PM

## 2022-08-20 NOTE — Progress Notes (Signed)
[facility]  - [place] [facility]   PATIENT NAME: Charles Daniel    MR#:  [id number]  DATE OF BIRTH:  [birth year]  SUBJECTIVE:  patient sitting out in the chair. No family at bedside. Looks much improved. Continues to have some wheezing. Mentation better. It good breakfast. Getting breathing treatment no fever.   VITALS:  Blood pressure (!) 165/86, pulse 99, temperature (!) 97.4 F (36.3 C), temperature source Oral, resp. rate (!) 26, weight 63.3 kg, SpO2 99 %.  PHYSICAL EXAMINATION:   GENERAL:  82 y.o.-year-old patient with mild to mod acute distress. Weak LUNGS: Normal breath sounds bilaterally, bilateral ins and exp  wheezing CARDIOVASCULAR: S1, S2 normal. No murmur  tachycardia ABDOMEN: Soft, nontender, nondistended. Bowel sounds present.  EXTREMITIES: No  edema b/l.    NEUROLOGIC: nonfocal  patient is alert and more oriented today SKIN: No obvious rash, lesion, or ulcer.   LABORATORY PANEL:  CBC Recent Labs  Lab 08/19/22 0610  WBC 21.4*  HGB 11.7*  HCT 35.4*  PLT 150     Chemistries  Recent Labs  Lab 08/19/22 0233 08/19/22 0610  NA  --  137  K  --  4.9  CL  --  105  CO2  --  22  GLUCOSE  --  154*  BUN  --  21  CREATININE  --  1.42*  CALCIUM  --  8.8*  MG 2.1  --   AST  --  67*  ALT  --  26  ALKPHOS  --  68  BILITOT  --  1.2    Cardiac Enzymes No results for input(s): "TROPONINI" in the last 168 hours. RADIOLOGY:  DG Chest Port 1 View  Result Date: 08/19/2022 CLINICAL DATA:  Bronchospasm EXAM: PORTABLE CHEST 1 VIEW COMPARISON:  X-ray 08/18/2022 and older FINDINGS: No consolidation, pneumothorax or effusion. No edema. Tortuous and ectatic aorta. Normal cardiopericardial silhouette. Overlapping cardiac leads. Film is rotated to the left. Eventration of the right hemidiaphragm IMPRESSION: No acute cardiopulmonary disease Electronically Signed   By: Karen Kays M.D.   On: 08/19/2022 15:09   CT ABDOMEN PELVIS WO CONTRAST  Result  Date: 08/19/2022 CLINICAL DATA:  Kidney failure, acute EXAM: CT ABDOMEN AND PELVIS WITHOUT CONTRAST TECHNIQUE: Multidetector CT imaging of the abdomen and pelvis was performed following the standard protocol without IV contrast. RADIATION DOSE REDUCTION: This exam was performed according to the departmental dose-optimization program which includes automated exposure control, adjustment of the mA and/or kV according to patient size and/or use of iterative reconstruction technique. COMPARISON:  CT abdomen pelvis 08/18/2007, CT abdomen pelvis 10/11/2019 FINDINGS: Lower chest: Trace right pleural effusion.  Tiny hiatal hernia. Hepatobiliary: No focal liver abnormality. No gallstones, gallbladder wall thickening, or pericholecystic fluid. No biliary dilatation. Pancreas: No focal lesion. Normal pancreatic contour. No surrounding inflammatory changes. No main pancreatic ductal dilatation. Spleen: Normal in size without focal abnormality. Adrenals/Urinary Tract: No adrenal nodule bilaterally. No nephrolithiasis and no hydronephrosis. No definite contour-deforming renal mass. No ureterolithiasis or hydroureter. The urinary bladder is unremarkable. Excretion of previously administered intravenous contrast noted within bilateral collecting systems. Stomach/Bowel: Surgical changes related to rectosigmoid resection. Stomach is within normal limits. No evidence of bowel wall thickening or dilatation. Appendix appears normal. Vascular/Lymphatic: No abdominal aorta or iliac aneurysm. Severe atherosclerotic plaque of the aorta and its branches. No abdominal, pelvic, or inguinal lymphadenopathy. Reproductive: Radiation seeds noted along the expected region of the prostate. Other: No intraperitoneal free fluid. No intraperitoneal free gas. No  organized fluid collection. Musculoskeletal: No abdominal wall hernia or abnormality. No suspicious lytic or blastic osseous lesions. No acute displaced fracture. Redemonstration of  age-indeterminate T11, L1, L2 compression fractures. IMPRESSION: 1. No acute intra-abdominal or intrapelvic abnormality with limited evaluation on this noncontrast study. 2. Excretion of previously administered intravenous contrast noted within bilateral collecting systems. 3. Other imaging findings of potential clinical significance: Tiny hiatal hernia. Trace right pleural effusion. Aortic Atherosclerosis (ICD10-I70.0). Electronically Signed   By: Morgane  Naveau M.D.   On: 08/19/2022 01:45   MR BRAIN WO CONTRAST  Result Date: 08/19/2022 CLINICAL DATA:  Altered mental status EXAM: MRI HEAD WITHOUT CONTRAST TECHNIQUE: Multiplanar, multiecho pulse sequences of the brain and surrounding structures were obtained without intravenous contrast. COMPARISON:  None Available. FINDINGS: The brain is partially obscured by susceptibility artifacts related to metallic foreign body. There is also patient motion. Brain: No acute infarct, mass effect or extra-axial collection. Chronic microhemorrhage in the right cerebellum. There is confluent hyperintense T2-weighted signal within the white matter. Generalized volume loss. The midline structures are normal. Vascular: Major flow voids are preserved. Skull and upper cervical spine: Normal calvarium and skull base. Visualized upper cervical spine and soft tissues are normal. Sinuses/Orbits:No paranasal sinus fluid levels or advanced mucosal thickening. No mastoid or middle ear effusion. Normal orbits. IMPRESSION: 1. No acute intracranial abnormality. 2. Findings of chronic small vessel ischemia and volume loss. Electronically Signed   By: Kevin  Herman M.D.   On: 08/19/2022 00:56   CT Head Wo Contrast  Result Date: 08/18/2022 CLINICAL DATA:  Mental status change, unknown cause EXAM: CT HEAD WITHOUT CONTRAST TECHNIQUE: Contiguous axial images were obtained from the base of the skull through the vertex without intravenous contrast. RADIATION DOSE REDUCTION: This exam was  performed according to the departmental dose-optimization program which includes automated exposure control, adjustment of the mA and/or kV according to patient size and/or use of iterative reconstruction technique. COMPARISON:  MRI head 05/15/2019, CT head 05/15/2019, PET CT 02/20/2022 FINDINGS: Brain: Patchy and confluent areas of decreased attenuation are noted throughout the deep and periventricular white matter of the cerebral hemispheres bilaterally, compatible with chronic microvascular ischemic disease. Redemonstration of right frontotemporal encephalomalacia. No evidence of large-territorial acute infarction. No parenchymal hemorrhage. No mass lesion. No extra-axial collection. No mass effect or midline shift. No hydrocephalus. Basilar cisterns are patent. Vascular: No hyperdense vessel. Skull: No acute fracture or focal lesion. Similar-appearing metallic density along the left scalp. Sinuses/Orbits: Sphenoid, ethmoid, maxillary sinus mucosal thickening. Paranasal sinuses and mastoid air cells are clear. The orbits are unremarkable. Other: None. IMPRESSION: No acute intracranial abnormality. Electronically Signed   By: Morgane  Naveau M.D.   On: 08/18/2022 21:43   CT Angio Chest PE W and/or Wo Contrast  Result Date: 08/18/2022 CLINICAL DATA:  Shortness of breath since last night. History of COPD. PE suspected EXAM: CT ANGIOGRAPHY CHEST WITH CONTRAST TECHNIQUE: Multidetector CT imaging of the chest was performed using the standard protocol during bolus administration of intravenous contrast. Multiplanar CT image reconstructions and MIPs were obtained to evaluate the vascular anatomy. RADIATION DOSE REDUCTION: This exam was performed according to the departmental dose-optimization program which includes automated exposure control, adjustment of the mA and/or kV according to patient size and/or use of iterative reconstruction technique. CONTRAST:  50m84mH55mH76mH73mHeHerbie Drape IOHEXOL 350 MG/ML SOLN COMPARISON:   Radiographs 08/18/2022 and report from CT chest 08/18/2007 FINDINGS: Cardiovascular: Satisfactory opacification of the pulmonary arteries to the segmental level. No pulmonary embolism. No evidence of acute aortic syndrome.  Coronary artery and aortic atherosclerotic calcification. No pericardial effusion. Mediastinum/Nodes: Small hiatal hernia. Esophagus is unremarkable. 1.2 cm right hilar node (4/64). Otherwise no thoracic adenopathy. Lungs/Pleura: Bibasilar scarring/atelectasis. Within the posterolateral left lower lobe there is an area of more nodular consolidation measuring 2.0 x 1.0 cm (5/104). Mild bronchial wall thickening and mucous plugging greatest in the lower lobes. Mild right apical scarring. No pleural effusion or pneumothorax. Upper Abdomen: No acute abnormality. Musculoskeletal: Age-indeterminate compression deformities of T4, T8, T11, L1, and L2. These may be chronic however there is not a good recent comparison to confirm chronicity. No rib fractures. Review of the MIP images confirms the above findings. IMPRESSION: 1. No acute pulmonary embolism. 2. Mild bronchial wall thickening and mucous plugging greatest in the lower lobes. 3. Within the posterolateral left lower lobe there is an area of nodular consolidation measuring 2.0 x 1.0 cm. This may be related to chronic scarring however follow-up is recommended to ensure stability. Consider one of the following in 3 months for both low-risk and high-risk individuals: (a) repeat chest CT, (b) follow-up PET-CT, or (c) tissue sampling. This recommendation follows the consensus statement: Guidelines for Management of Incidental Pulmonary Nodules Detected on CT Images: From the Fleischner Society 2017; Radiology 2017; 284:228-243. 4. Age-indeterminate compression deformities of T4, T8, T11, L1, and L2. These may be chronic however there is not a good recent comparison to confirm chronicity. Correlate with areas of pain and consider dedicated workup when  clinically appropriate. Aortic Atherosclerosis (ICD10-I70.0). Electronically Signed   By: Minerva Fester M.D.   On: 08/18/2022 19:46   DG Chest 2 View  Result Date: 08/18/2022 CLINICAL DATA:  Shortness of breath EXAM: CHEST - 2 VIEW COMPARISON:  X-ray 05/29/2021 FINDINGS: Underinflation. No consolidation, pneumothorax or effusion. No edema. Normal cardiopericardial silhouette with calcified and tortuous aorta. There are some compression deformities of the mid and lower thoracic spine on the lateral view which are new from 2023. Etiology is uncertain. Please correlate for any known history or dedicated workup when appropriate. Patient does have a history of prostate cancer. IMPRESSION: Underinflation.  No consolidation. There is slight compression of mid and lower thoracic spine vertebral levels which was not seen on x-ray of January 2023. etiology is uncertain. Dedicated workup when clinically appropriate Electronically Signed   By: Karen Kays M.D.   On: 08/18/2022 17:29    Assessment and Plan  Daymion Nazaire is a 82 y.o. male with a history of hypertension, stroke, emphysema, HIV, tuberculosis who comes ED complaining of generalized weakness, shortness of breath with walking, worse for the past 2 days.  Started after the patient was mowing grass 4 days ago   Acute on chronic hypoxic respiratory failure secondary to COPD exacerbation history of emphysema history of smoking appears remote -- came in with increasing shortness of breath after moving grass four days ago. No fever. Per history patient does not use oxygen at home. Has history of smoking. -- Significant bronchospasm. -- On IV Solu-Medrol BID, bronchodilator, nebulizer -- ABG looks stable -- pulmonary consultation with Dr.aleskerov appreciated -- received dose of magnesium sulfate -- seen by speech therapy. Started on. Diet tolerating it well.  Severe sepsis suspected due to bronchitis as noted on CT chest -- came in with shortness  of breath, tachycardia, elevated white count, abnormal CT chest -- continue broad-spectrum antibiotic -- monitor white count -- received IV fluids. Lactic acid trending down. Watch for pulmonary edema  Acute metabolic encephalopathy in the setting of hypoxia and sepsis --  patient was quite combative/agitated. Received Ativan and Haldol -- MRI brain negative -- PRN Haldol  Malignant hypertension -- BP elevated in the setting of combativeness/agitation -- IV hydralazine, IV labetalol PRN till patient able to take oral meds -- at home patient on amlodipine, lisinopril/hydrochlorothiazide--now resumed --check BMP in am  At risk for aspiration -- speech therapy to see patient  History of HIV -- seen by infectious disease Dr. Joylene Draft. Recommends change in HIV therapy. She will get in touch with patient's outpatient infectious disease specialist.  Acute renal failure in the setting of sepsis -- baseline creatinine 1.09 -- came in with creatinine of 1.42 -- IV fluids and avoid nephrotoxic agents   Procedures: Family communication :left message for dter Renee Rival Consults : pulmonary CODE STATUS: full DVT Prophylaxis : Level of care: Telemetry Cardiac Status is: Inpatient Remains inpatient appropriate because: severe sepsis    TOTAL TIME TAKING CARE OF THIS PATIENT: 35 minutes.  >50% time spent on counselling and coordination of care  Note: This dictation was prepared with Dragon dictation along with smaller phrase technology. Any transcriptional errors that result from this process are unintentional.  Enedina Finner M.D    [facility]   CC: Primary care physician; [name], MD

## 2022-08-20 NOTE — TOC Initial Note (Signed)
Transition of Care [facility]) - Initial/Assessment Note    Patient Details  Name: Charles Daniel MRN: [id number] Date of Birth: Jun 16, 1940  Transition of Care [facility]) CM/SW Contact:    Truddie Hidden, RN Phone Number: 08/20/2022, 11:51 AM  Clinical Narrative:                  Transition of Care [facility]) Screening Note   Patient Details  Name: Charles Daniel Date of Birth: [birth year]   Transition of Care [facility]) CM/SW Contact:    Truddie Hidden, RN Phone Number: 08/20/2022, 11:51 AM    Transition of Care Department [facility]) has reviewed patient and no TOC needs have been identified at this time. We will continue to monitor patient advancement through interdisciplinary progression rounds. If new patient transition needs arise, please place a TOC consult.          Patient Goals and CMS Choice            Expected Discharge Plan and Services                                              Prior Living Arrangements/Services                       Activities of Daily Living Home Assistive Devices/Equipment: Eyeglasses ADL Screening (condition at time of admission) Patient's cognitive ability adequate to safely complete daily activities?: Yes Is the patient deaf or have difficulty hearing?: No Does the patient have difficulty seeing, even when wearing glasses/contacts?: No Does the patient have difficulty concentrating, remembering, or making decisions?: Yes Patient able to express need for assistance with ADLs?: Yes Does the patient have difficulty dressing or bathing?: Yes Independently performs ADLs?: No Communication: Independent Dressing (OT): Needs assistance Is this a change from baseline?: Pre-admission baseline Grooming: Independent Feeding: Independent Bathing: Needs assistance Is this a change from baseline?: Change from baseline, expected to last <3 days Toileting: Needs assistance Is this a change from baseline?: Change from baseline, expected to  last <3 days In/Out Bed: Needs assistance Is this a change from baseline?: Change from baseline, expected to last <3 days Walks in Home: Needs assistance Is this a change from baseline?: Change from baseline, expected to last <3 days Does the patient have difficulty walking or climbing stairs?: Yes Weakness of Legs: Both Weakness of Arms/Hands: None  Permission Sought/Granted                  Emotional Assessment              Admission diagnosis:  Dizziness [R42] SOB (shortness of breath) [R06.02] COPD exacerbation [J44.1] Sepsis [A41.9] Severe sepsis [A41.9, R65.20] Acute hypoxemic respiratory failure [J96.01] Community acquired pneumonia, unspecified laterality [J18.9] HIV infection, unspecified symptom status [B20] Patient Active Problem List   Diagnosis Date Noted   SOB (shortness of breath) 08/19/2022   Dizziness 08/19/2022   Severe sepsis 08/19/2022   Acute lactic acidosis 08/19/2022   Sepsis 08/19/2022   Acute on chronic respiratory failure with hypoxia 08/19/2022   AKI (acute kidney injury) 08/19/2022   Acute hypoxemic respiratory failure 08/19/2022   Rising PSA following treatment for malignant neoplasm of prostate 11/16/2021   Hearing loss 11/11/2021   Vision impairment 11/11/2021   CKD (chronic kidney disease) stage 3, GFR 30-59 ml/min 03/25/2020   Prediabetes 03/25/2020   Low  HDL (under 40) 03/18/2020   Diminished pulses in lower extremity 09/04/2019   Tobacco use 05/16/2019   History of ischemic stroke 05/15/2019   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   TIA (transient ischemic attack) 11/18/2017   COPD with emphysema 10/18/2015   Advanced care planning/counseling discussion 06/26/2014   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   Prostate cancer 11/08/2012   Medicare annual wellness visit, subsequent 08/02/2012   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 [place] [address] Phone: [phone] Fax: [phone]  [facility] - [place], [place] - [address] [place] [address] Phone: [phone] Fax: [phone]     Social Determinants of Health (SDOH) Social History: SDOH Screenings   Depression (PHQ2-9): Low Risk  (11/11/2021)  Tobacco Use: Medium Risk (08/19/2022)   SDOH Interventions:     Readmission Risk Interventions     No data to display

## 2022-08-20 NOTE — Evaluation (Signed)
Physical Therapy Evaluation Patient Details Name: Charles Daniel MRN: [id number] DOB: July 20, 1940 Today's Date: 08/20/2022  History of Present Illness  Pt is an 82 y.o. male presenting to hospital 08/18/22 with c/o SOB with walking and generalized weakness.  Pt admitted with SOB, acute lactic acidosis, severe sepsis, dizziness, CKD, and COPD with emphysema.  PMH includes htn, stroke, emphysema, HIV, tuberculosis, colon CA, prostate CA, h/o syphillis.  Clinical Impression  Prior to hospital admission, pt was independent with ambulation; lives with his wife in 1 level home (no STE); pt's son in law present during session and reports family able to provide 24/7 assist upon hospital discharge.  No c/o pain during session.  Currently pt is CGA with transfers and CGA to ambulate 40 feet with RW use (limited distance d/t increased SOB/wheezing with ambulation requiring sitting rest break to recover).  Nurse cleared PT to trial pt on room air during session and pt's O2 sats 94% or greater on room air during sessions activities (pt placed back on 2 L O2 via nasal cannula end of session per pt request for comfort--discussed with pt's nurse).  Pt sitting on edge of bed with OT present (for OT eval) end of PT session.  Pt would currently benefit from skilled PT to address noted impairments and functional limitations (see below for any additional details).  Upon hospital discharge, pt would benefit from ongoing therapy.    Recommendations for follow up therapy are one component of a multi-disciplinary discharge planning process, led by the attending physician.  Recommendations may be updated based on patient status, additional functional criteria and insurance authorization.        Assistance Recommended at Discharge Frequent or constant Supervision/Assistance  Patient can return home with the following  A little help with walking and/or transfers;A little help with bathing/dressing/bathroom;Assistance with  cooking/housework;Assist for transportation;Help with stairs or ramp for entrance    Equipment Recommendations Rolling walker (2 wheels)  Recommendations for Other Services       Functional Status Assessment Patient has had a recent decline in their functional status and demonstrates the ability to make significant improvements in function in a reasonable and predictable amount of time.     Precautions / Restrictions Precautions Precautions: Fall Restrictions Weight Bearing Restrictions: No      Mobility  Bed Mobility               General bed mobility comments: Deferred (pt sitting in recliner beginning of session and sitting on edge of bed end of session)    Transfers Overall transfer level: Needs assistance Equipment used: None Transfers: Sit to/from Stand Sit to Stand: Min guard           General transfer comment: mild increased effort to stand; single UE support for balance    Ambulation/Gait Ambulation/Gait assistance: Min guard Gait Distance (Feet): 40 Feet Assistive device: Rolling walker (2 wheels)   Gait velocity: decreased     General Gait Details: partial to step through gait pattern; occasional cues for walker management around obstacles; limited d/t SOB/wheezing  Stairs            Wheelchair Mobility    Modified Rankin (Stroke Patients Only)       Balance Overall balance assessment: Needs assistance Sitting-balance support: No upper extremity supported, Feet supported Sitting balance-Leahy Scale: Good Sitting balance - Comments: steady sitting reaching within BOS   Standing balance support: Bilateral upper extremity supported, During functional activity, Reliant on assistive device for balance Standing balance-Leahy Scale:  Good Standing balance comment: steady ambulating with RW use                             Pertinent Vitals/Pain Pain Assessment Pain Assessment: No/denies pain    Home Living Family/patient  expects to be discharged to:: Private residence Living Arrangements: Spouse/significant other Available Help at Discharge: Family;Available 24 hours/day Type of Home: House Home Access: Level entry       Home Layout: One level Home Equipment: None      Prior Function Prior Level of Function : Independent/Modified Independent             Mobility Comments: No recent falls reported.       Hand Dominance        Extremity/Trunk Assessment   Upper Extremity Assessment Upper Extremity Assessment: Defer to OT evaluation    Lower Extremity Assessment Lower Extremity Assessment: Generalized weakness    Cervical / Trunk Assessment Cervical / Trunk Assessment: Normal  Communication   Communication: No difficulties  Cognition Arousal/Alertness: Awake/alert Behavior During Therapy: WFL for tasks assessed/performed Overall Cognitive Status: Within Functional Limits for tasks assessed                                          General Comments  Nursing cleared pt for participation in physical therapy.  Pt agreeable to PT session.    Exercises     Assessment/Plan    PT Assessment Patient needs continued PT services  PT Problem List Decreased strength;Decreased activity tolerance;Decreased balance;Decreased mobility;Decreased knowledge of use of DME;Decreased knowledge of precautions;Cardiopulmonary status limiting activity       PT Treatment Interventions DME instruction;Gait training;Functional mobility training;Therapeutic activities;Therapeutic exercise;Balance training;Patient/family education    PT Goals (Current goals can be found in the Care Plan section)  Acute Rehab PT Goals Patient Stated Goal: to improve breathing PT Goal Formulation: With patient/family Time For Goal Achievement: 09/03/22 Potential to Achieve Goals: Good    Frequency Min 3X/week     Co-evaluation               AM-PAC PT "6 Clicks" Mobility  Outcome Measure  Help needed turning from your back to your side while in a flat bed without using bedrails?: None Help needed moving from lying on your back to sitting on the side of a flat bed without using bedrails?: None Help needed moving to and from a bed to a chair (including a wheelchair)?: A Little Help needed standing up from a chair using your arms (e.g., wheelchair or bedside chair)?: A Little Help needed to walk in hospital room?: A Little Help needed climbing 3-5 steps with a railing? : A Little 6 Click Score: 20    End of Session Equipment Utilized During Treatment: Gait belt Activity Tolerance: Other (comment) (limited d/t SOB/wheezing)   Nurse Communication: Mobility status;Precautions;Other (comment) (pt's O2 sats during session) PT Visit Diagnosis: Muscle weakness (generalized) (M62.81);Other abnormalities of gait and mobility (R26.89)    Time: 0981-1914 PT Time Calculation (min) (ACUTE ONLY): 19 min   Charges:   PT Evaluation $PT Eval Low Complexity: 1 Low         Simar Pothier, PT 08/20/22, 2:18 PM

## 2022-08-21 DIAGNOSIS — B20 Human immunodeficiency virus [HIV] disease: Secondary | ICD-10-CM | POA: Diagnosis not present

## 2022-08-21 DIAGNOSIS — J9601 Acute respiratory failure with hypoxia: Secondary | ICD-10-CM | POA: Diagnosis not present

## 2022-08-21 DIAGNOSIS — J441 Chronic obstructive pulmonary disease with (acute) exacerbation: Secondary | ICD-10-CM | POA: Diagnosis not present

## 2022-08-21 LAB — CBC
HCT: 32.5 % — ABNORMAL LOW (ref 39.0–52.0)
Hemoglobin: 10.6 g/dL — ABNORMAL LOW (ref 13.0–17.0)
MCH: 28 pg (ref 26.0–34.0)
MCHC: 32.6 g/dL (ref 30.0–36.0)
MCV: 86 fL (ref 80.0–100.0)
Platelets: 149 10*3/uL — ABNORMAL LOW (ref 150–400)
RBC: 3.78 MIL/uL — ABNORMAL LOW (ref 4.22–5.81)
RDW: 14.6 % (ref 11.5–15.5)
WBC: 20.3 10*3/uL — ABNORMAL HIGH (ref 4.0–10.5)
nRBC: 0 % (ref 0.0–0.2)

## 2022-08-21 LAB — BASIC METABOLIC PANEL
Anion gap: 6 (ref 5–15)
BUN: 29 mg/dL — ABNORMAL HIGH (ref 8–23)
CO2: 28 mmol/L (ref 22–32)
Calcium: 8.6 mg/dL — ABNORMAL LOW (ref 8.9–10.3)
Chloride: 105 mmol/L (ref 98–111)
Creatinine, Ser: 1.02 mg/dL (ref 0.61–1.24)
GFR, Estimated: >60 mL/min (ref >60)
Glucose, Bld: 133 mg/dL — ABNORMAL HIGH (ref 70–99)
Potassium: 4 mmol/L (ref 3.5–5.1)
Sodium: 139 mmol/L (ref 135–145)

## 2022-08-21 LAB — GLUCOSE, CAPILLARY
Glucose-Capillary: 122 mg/dL — ABNORMAL HIGH (ref 70–99)
Glucose-Capillary: 130 mg/dL — ABNORMAL HIGH (ref 70–99)
Glucose-Capillary: 114 mg/dL — ABNORMAL HIGH (ref 70–99)

## 2022-08-21 LAB — HIV-1 RNA QUANT-NO REFLEX-BLD
HIV 1 RNA Quant: <20 copies/mL
LOG10 HIV-1 RNA: UNDETERMINED log10copy/mL

## 2022-08-21 LAB — CULTURE, BLOOD (ROUTINE X 2): Culture: NO GROWTH

## 2022-08-21 LAB — T.PALLIDUM AB, TOTAL: T Pallidum Abs: REACTIVE — AB

## 2022-08-21 LAB — PROCALCITONIN: Procalcitonin: 0.24 ng/mL

## 2022-08-21 MED ORDER — AZITHROMYCIN 250 MG PO TABS
500.0000 mg | ORAL_TABLET | Freq: Every day | ORAL | Status: DC
  Administered 2022-08-21 – 2022-08-22 (×2): 500 mg via ORAL
  Filled 2022-08-21 (×2): qty 2

## 2022-08-21 MED ORDER — AMOXICILLIN-POT CLAVULANATE 875-125 MG PO TABS
1.0000 | ORAL_TABLET | Freq: Two times a day (BID) | ORAL | Status: DC
  Administered 2022-08-21 – 2022-08-22 (×3): 1 via ORAL
  Filled 2022-08-21 (×3): qty 1

## 2022-08-21 MED ORDER — ALBUTEROL SULFATE (2.5 MG/3ML) 0.083% IN NEBU
3.0000 mL | INHALATION_SOLUTION | Freq: Four times a day (QID) | RESPIRATORY_TRACT | Status: DC | PRN
  Administered 2022-08-22: 3 mL via RESPIRATORY_TRACT
  Filled 2022-08-21: qty 3

## 2022-08-21 MED ORDER — ENSURE ENLIVE PO LIQD
237.0000 mL | Freq: Two times a day (BID) | ORAL | Status: DC
  Administered 2022-08-21: 237 mL via ORAL

## 2022-08-21 MED ORDER — PREDNISONE 20 MG PO TABS
20.0000 mg | ORAL_TABLET | Freq: Every day | ORAL | Status: DC
  Administered 2022-08-22: 20 mg via ORAL
  Filled 2022-08-21: qty 1

## 2022-08-21 NOTE — Progress Notes (Signed)
Speech Language Pathology Treatment: Dysphagia  Patient Details Name: Charles Daniel MRN: [id number] DOB: 04-01-1941 Today's Date: 08/21/2022 Time: 4098-1191 SLP Time Calculation (min) (ACUTE ONLY): 40 min  Assessment / Plan / Recommendation Clinical Impression  Pt seen today for ongoing assessment of swallowing and toleration of diet; trials to upgrade diet consistency now that pt's mental status is much improved. Pt A/O x3 and engaged in general conversation w/ this SLP laughing at jokes. Noted min abdominal thoracic breathing w/ any increased talking (and eating) at rest at Baseline. NSG/MD aware.   On [place] O2 support 2L; afebrile.   Pt appears to present w/ grossly functional oropharyngeal phase swallowing w/ No oropharyngeal phase dysphagia noted this session; No sensorimotor deficits noted. Pt consumed po trials w/ No overt, clinical s/s of aspiration during po trials.  Pt appears at reduced risk for aspiration following general aspiration precautions and using a slightly modified diet of cooked/soft foods for easier mastication/chewing for conservation of energy. Pt was also instructed/educated on using Rest Breaks during meals to reduce in WOB he experiences w/ any exertion. Pt does have the challenging factors to include weakness/deconditioning, acute illness, and respiratory decline that could impact oropharyngeal swallowing and increase risk for aspiration, dysphagia as well as decreased oral intake overall. Pt was educated on general aspiration precautions which appeared to work well during the po trials; he followed well w/ reduced need for cues.   During po trials, pt consumed all consistencies of thin liquids and soft solids, moistened w/ no overt coughing, decline in vocal quality, or change in respiratory presentation during/post trials. O2 sats remained 97%. Oral phase appeared [facility] w/ timely bolus management, mastication, and control of bolus propulsion for A-P transfer for swallowing.  Oral clearing achieved w/ all trial consistencies -- moistened, soft foods given.  Pt fed self w/ setup support.   Recommend a more mech soft consistency diet w/ well-Cut meats, moistened foods for easier chewing and conservation of energy; Thin liquids. Recommend general aspiration precautions, Pills CRUSHED vs WHOLE in Puree for safer, easier swallowing.  Education given on Pills in Puree; food consistencies and easy to eat options; general aspiration precautions to pt. MD/NSG updated and will reconsult if any new needs arise. Recommend Dietician f/u for support as pt stated he liked Ensure at home.      HPI HPI: Pt is a 82 y.o. male with a history of hypertension, stroke, emphysema, HIV, tuberculosis who comes ED complaining of generalized weakness, shortness of breath with walking, worse for the past 2 days. On Several occasions, pt has been combative and aggressive w/ Staff including this morning; sedating medications given.  CT Imaging of chest since admit: No acute pulmonary embolism.  2. Mild bronchial wall thickening and mucous plugging greatest in  the lower lobes.  3. Within the posterolateral left lower lobe there is an area of  nodular consolidation measuring 2.0 x 1.0 cm.; trace right pleural effusion and small hiatal hernia.   Unsure of Baseline Cognitive status but pt was mowing grass ~2 days ago per Family member.      SLP Plan  All goals met      Recommendations for follow up therapy are one component of a multi-disciplinary discharge planning process, led by the attending physician.  Recommendations may be updated based on patient status, additional functional criteria and insurance authorization.    Recommendations  Diet recommendations: Dysphagia 3 (mechanical soft);Thin liquid (for easy to eat foods) Liquids provided via: Cup;Straw Medication Administration: Whole meds  with puree (vs CRUSHED in Puree) Supervision: Patient able to self feed;Intermittent supervision to cue  for compensatory strategies Compensations: Minimize environmental distractions;Slow rate;Small sips/bites;Lingual sweep for clearance of pocketing;Follow solids with liquid Postural Changes and/or Swallow Maneuvers: Out of bed for meals;Seated upright 90 degrees;Upright 30-60 min after meal                 (Dietician f/u) Oral care BID;Oral care before and after PO;Patient independent with oral care (Denture care; setup)   Intermittent Supervision/Assistance Dysphagia, unspecified (R13.10)     All goals met       Charles Som, MS, CCC-SLP Speech Language Pathologist Rehab Services; [facility] [phone] (ascom) Charles Daniel  08/21/2022, 12:06 PM

## 2022-08-21 NOTE — Progress Notes (Signed)
Physical Therapy Treatment Patient Details Name: Charles Daniel MRN: [id number] DOB: [birth year] Today's Date: 08/21/2022   History of Present Illness Pt is an 82 y.o. male presenting to hospital 08/18/22 with c/o SOB with walking and generalized weakness.  Pt admitted with SOB, acute lactic acidosis, severe sepsis, dizziness, CKD, and COPD with emphysema.  PMH includes htn, stroke, emphysema, HIV, tuberculosis, colon CA, prostate CA, h/o syphillis.    PT Comments    Pt resting in bed upon PT arrival; pt initially declining therapy (d/t wanting to rest) but then agreeable to a short walk.  During session pt SBA with bed mobility; CGA with transfers using RW; and CGA to ambulate 120 feet with RW use.  Pt steady with functional mobility using RW.  Mild SOB and wheezing noted end of ambulation but recovered within a couple minutes of rest (SpO2 sats 89% post ambulation but >92% at rest on room air).  Will continue to focus on strengthening, balance, and progressive functional mobility during hospitalization.    Recommendations for follow up therapy are one component of a multi-disciplinary discharge planning process, led by the attending physician.  Recommendations may be updated based on patient status, additional functional criteria and insurance authorization.        Assistance Recommended at Discharge Intermittent Supervision/Assistance  Patient can return home with the following A little help with walking and/or transfers;A little help with bathing/dressing/bathroom;Assistance with cooking/housework;Assist for transportation;Help with stairs or ramp for entrance   Equipment Recommendations  Rolling walker (2 wheels)    Recommendations for Other Services       Precautions / Restrictions Precautions Precautions: Fall Restrictions Weight Bearing Restrictions: No     Mobility  Bed Mobility Overal bed mobility: Needs Assistance Bed Mobility: Supine to Sit, Sit to Supine     Supine to  sit: Supervision, HOB elevated Sit to supine: Supervision, HOB elevated   General bed mobility comments: mild increased effort to perform on own    Transfers Overall transfer level: Needs assistance Equipment used: Rolling walker (2 wheels) Transfers: Sit to/from Stand Sit to Stand: Min guard           General transfer comment: mild increased effort to stand; steady with RW use    Ambulation/Gait Ambulation/Gait assistance: Min guard Gait Distance (Feet): 120 Feet Assistive device: Rolling walker (2 wheels)   Gait velocity: decreased     General Gait Details: step through gait pattern; steady with RW use   Stairs             Wheelchair Mobility    Modified Rankin (Stroke Patients Only)       Balance Overall balance assessment: Needs assistance Sitting-balance support: No upper extremity supported, Feet supported Sitting balance-Leahy Scale: Good Sitting balance - Comments: steady sitting reaching within BOS   Standing balance support: Bilateral upper extremity supported, During functional activity, Reliant on assistive device for balance Standing balance-Leahy Scale: Good Standing balance comment: steady ambulating with RW use                            Cognition Arousal/Alertness: Awake/alert Behavior During Therapy: WFL for tasks assessed/performed Overall Cognitive Status: Within Functional Limits for tasks assessed                                          Exercises  General Comments  Nursing cleared pt for participation in physical therapy.  Pt agreeable to PT session.      Pertinent Vitals/Pain Pain Assessment Pain Assessment: No/denies pain HR WFL during sessions activities.    Home Living                          Prior Function            PT Goals (current goals can now be found in the care plan section) Acute Rehab PT Goals Patient Stated Goal: to improve breathing PT Goal  Formulation: With patient Time For Goal Achievement: 09/03/22 Potential to Achieve Goals: Good Progress towards PT goals: Progressing toward goals    Frequency    Min 3X/week      PT Plan Current plan remains appropriate    Co-evaluation              AM-PAC PT "6 Clicks" Mobility   Outcome Measure  Help needed turning from your back to your side while in a flat bed without using bedrails?: None Help needed moving from lying on your back to sitting on the side of a flat bed without using bedrails?: None Help needed moving to and from a bed to a chair (including a wheelchair)?: A Little Help needed standing up from a chair using your arms (e.g., wheelchair or bedside chair)?: A Little Help needed to walk in hospital room?: A Little Help needed climbing 3-5 steps with a railing? : A Little 6 Click Score: 20    End of Session Equipment Utilized During Treatment: Gait belt Activity Tolerance: Patient tolerated treatment well Patient left: in bed;with call bell/phone within reach;with bed alarm set;Other (comment) (fall mat in place) Nurse Communication: Mobility status;Precautions;Other (comment) (pt's O2 sats during session) PT Visit Diagnosis: Muscle weakness (generalized) (M62.81);Other abnormalities of gait and mobility (R26.89)     Time: 1308-6578 PT Time Calculation (min) (ACUTE ONLY): 15 min  Charges:  $Therapeutic Activity: 8-22 mins                     Hendricks Limes, PT 08/21/22, 4:49 PM

## 2022-08-21 NOTE — Progress Notes (Signed)
[facility]  - [place] [facility]   PATIENT NAME: Charles Daniel    MR#:  [id number]  DATE OF BIRTH:  1940-11-11  SUBJECTIVE:  patient sitting out in the chair Niece at bedside. Looks much improved mental status back to baseline. Right arm IV infiltrated some swelling present. Mild tenderness. No fever. Overall looks much better what with speech therapy advanced his diet today.  VITALS:  Blood pressure (!) 185/92, pulse 77, temperature 98 F (36.7 C), resp. rate 15, height 5' 5.98" (1.676 m), weight 63.5 kg, SpO2 95 %.  PHYSICAL EXAMINATION:   GENERAL:  82 y.o.-year-old patient with mild to mod acute distress. Weak LUNGS: Normal breath sounds bilaterally, no wheezingg CARDIOVASCULAR: S1, S2 normal. No murmur  ABDOMEN: Soft, nontender, nondistended. Bowel sounds present.  EXTREMITIES: No  edema b/l.    NEUROLOGIC: nonfocal  patient is alert and more oriented today SKIN: No obvious rash, lesion, or ulcer.   LABORATORY PANEL:  CBC Recent Labs  Lab 08/21/22 0522  WBC 20.3*  HGB 10.6*  HCT 32.5*  PLT 149*     Chemistries  Recent Labs  Lab 08/19/22 0233 08/19/22 0610 08/21/22 0522  NA  --  137 139  K  --  4.9 4.0  CL  --  105 105  CO2  --  22 28  GLUCOSE  --  154* 133*  BUN  --  21 29*  CREATININE  --  1.42* 1.02  CALCIUM  --  8.8* 8.6*  MG 2.1  --   --   AST  --  67*  --   ALT  --  26  --   ALKPHOS  --  68  --   BILITOT  --  1.2  --     Cardiac Enzymes No results for input(s): "TROPONINI" in the last 168 hours. RADIOLOGY:  DG Chest Port 1 View  Result Date: 08/19/2022 CLINICAL DATA:  Bronchospasm EXAM: PORTABLE CHEST 1 VIEW COMPARISON:  X-ray 08/18/2022 and older FINDINGS: No consolidation, pneumothorax or effusion. No edema. Tortuous and ectatic aorta. Normal cardiopericardial silhouette. Overlapping cardiac leads. Film is rotated to the left. Eventration of the right hemidiaphragm IMPRESSION: No acute cardiopulmonary disease  Electronically Signed   By: Karen Kays M.D.   On: 08/19/2022 15:09    Assessment and Plan  Charles Daniel is a 82 y.o. male with a history of hypertension, stroke, emphysema, HIV, tuberculosis who comes ED complaining of generalized weakness, shortness of breath with walking, worse for the past 2 days.  Started after the patient was mowing grass 4 days ago   Acute on chronic hypoxic respiratory failure secondary to COPD exacerbation history of emphysema history of smoking appears remote -- came in with increasing shortness of breath after moving grass four days ago. No fever. Per history patient does not use oxygen at home. Has history of smoking. -- Significant bronchospasm--improved. -- On IV Solu-Medrol BID--change to oral, bronchodilator, nebulizer -- ABG looks stable -- pulmonary consultation with Dr.aleskerov appreciated -- received dose of magnesium sulfate -- seen by speech therapy. Started on.Diet tolerating it well.  Severe sepsis suspected due to bronchitis as noted on CT chest -- came in with shortness of breath, tachycardia, elevated white count, abnormal CT chest -- continue broad-spectrum antibiotic--change to po augmentin and zithormax -- received IV fluids. Lactic acid trending down. Watch for pulmonary edema --wbc trending down -- sepsis improved  Acute metabolic encephalopathy in the setting of hypoxia and sepsis -- patient was quite  combative/agitated. Received Ativan and Haldol -- MRI brain negative -- PRN Haldol -- mentation much improved  Malignant hypertension -- BP elevated in the setting of combativeness/agitation -- IV hydralazine, IV labetalol PRN till patient able to take oral meds -- at home patient on amlodipine, lisinopril/hydrochlorothiazide--now resumed -- labs stable  At risk for aspiration -- speech therapy-- input appreciated.  History of HIV -- seen by infectious disease Dr. Joylene Draft. Recommends change in HIV therapy. She will get in  touch with patient's outpatient infectious disease specialist.  Acute renal failure in the setting of sepsis -- baseline creatinine 1.09 -- came in with creatinine of 1.42--1.09 -- received IV fluids and avoid nephrotoxic agents  Patient is IV infiltrated. IV site swollen. Ice bag and PRN pain meds. Patient currently is on oral meds.   Procedures:none Family communication :lspoke with niece Consults : pulmonary CODE STATUS: full DVT Prophylaxis : Level of care: Telemetry Cardiac Status is: Inpatient Remains inpatient appropriate because: severe sepsis    TOTAL TIME TAKING CARE OF THIS PATIENT: 35 minutes.  >50% time spent on counselling and coordination of care  Note: This dictation was prepared with Dragon dictation along with smaller phrase technology. Any transcriptional errors that result from this process are unintentional.  Enedina Finner M.D    [facility]   CC: Primary care physician; [name], MD

## 2022-08-21 NOTE — Progress Notes (Signed)
Occupational Therapy Treatment Patient Details Name: Charles Daniel MRN: [id number] DOB: March 15, 1941 Today's Date: 08/21/2022   History of present illness Pt is an 82 y.o. male presenting to hospital 08/18/22 with c/o SOB with walking and generalized weakness.  Pt admitted with SOB, acute lactic acidosis, severe sepsis, dizziness, CKD, and COPD with emphysema.  PMH includes htn, stroke, emphysema, HIV, tuberculosis, colon CA, prostate CA, h/o syphillis.   OT comments  Charles Daniel demonstrates much improvement compared to yesterday's rehab session. Today he is more alert, better oriented, has less fatigue, less SOB, and improved balance. Trialed ambulating and without RW, pt endorses feeling much steadier with the AD. Pt able to perform sit<>stand with SUPV-CGA. Pt on room air today, able to maintain O2 sats in low 90s with OOB mobility, in upper 90s at rest. Anticipate that pt could benefit from ongoing OT in his natural setting post-DC, to assist with improved balance, endurance, fxl mobility, strength and with educ focused on home safety, activity pacing and other energy conservation strategies.     Recommendations for follow up therapy are one component of a multi-disciplinary discharge planning process, led by the attending physician.  Recommendations may be updated based on patient status, additional functional criteria and insurance authorization.    Assistance Recommended at Discharge Intermittent Supervision/Assistance  Patient can return home with the following  A little help with bathing/dressing/bathroom;Assistance with cooking/housework;Assist for transportation   Equipment Recommendations       Recommendations for Other Services      Precautions / Restrictions Precautions Precautions: Fall Restrictions Weight Bearing Restrictions: No       Mobility Bed Mobility Overal bed mobility: Needs Assistance Bed Mobility: Supine to Sit, Sit to Supine     Supine to sit:  Supervision, HOB elevated Sit to supine: Supervision, HOB elevated        Transfers Overall transfer level: Needs assistance Equipment used: Rolling walker (2 wheels) Transfers: Sit to/from Stand Sit to Stand: Supervision                 Balance Overall balance assessment: Needs assistance Sitting-balance support: No upper extremity supported, Feet supported Sitting balance-Leahy Scale: Good     Standing balance support: Bilateral upper extremity supported, During functional activity, Reliant on assistive device for balance Standing balance-Leahy Scale: Good Standing balance comment: balance much improved with RW                           ADL either performed or assessed with clinical judgement   ADL Overall ADL's : Needs assistance/impaired     Grooming: Wash/dry hands;Wash/dry face;Supervision/safety;Sitting           Upper Body Dressing : Supervision/safety;Sitting                          Extremity/Trunk Assessment              Vision       Perception     Praxis      Cognition Arousal/Alertness: Awake/alert Behavior During Therapy: WFL for tasks assessed/performed Overall Cognitive Status: Within Functional Limits for tasks assessed                                          Exercises Other Exercises Other Exercises: Educ re: HEP, safe use of DME, ECS  Shoulder Instructions       General Comments      Pertinent Vitals/ Pain       Pain Assessment Pain Assessment: No/denies pain  Home Living                                          Prior Functioning/Environment              Frequency  Min 2X/week        Progress Toward Goals  OT Goals(current goals can now be found in the care plan section)  Progress towards OT goals: Progressing toward goals  Acute Rehab OT Goals OT Goal Formulation: With patient Time For Goal Achievement: 09/03/22 Potential to Achieve  Goals: Good  Plan Discharge plan remains appropriate;Frequency remains appropriate    Co-evaluation                 AM-PAC OT "6 Clicks" Daily Activity     Outcome Measure   Help from another person eating meals?: None Help from another person taking care of personal grooming?: A Little Help from another person toileting, which includes using toliet, bedpan, or urinal?: A Little Help from another person bathing (including washing, rinsing, drying)?: A Lot Help from another person to put on and taking off regular upper body clothing?: A Little Help from another person to put on and taking off regular lower body clothing?: A Little 6 Click Score: 18    End of Session Equipment Utilized During Treatment: Rolling walker (2 wheels)  OT Visit Diagnosis: Unsteadiness on feet (R26.81);Muscle weakness (generalized) (M62.81)   Activity Tolerance Patient tolerated treatment well   Patient Left in bed;with call bell/phone within reach;with bed alarm set   Nurse Communication          Time: 1191-4782 OT Time Calculation (min): 10 min  Charges: OT General Charges $OT Visit: 1 Visit OT Treatments $Self Care/Home Management : 8-22 mins   Latina Craver, PhD, MS, OTR/L 08/21/22, 4:53 PM

## 2022-08-21 NOTE — Care Management Important Message (Signed)
Important Message  Patient Details  Name: Charles Daniel MRN: [id number] Date of Birth: 1940-11-27   Medicare Important Message Given:  Yes     [name] 08/21/2022, 2:32 PM

## 2022-08-21 NOTE — Progress Notes (Signed)
Medication Samples have been provided to the patient.  Drug name: dolutegravir/lamivudine (Dovato)       Strength: /300mg         Qty: 28  LOT: [id number]   Exp.Date: 03/2024  Dosing instructions: 1 tablet by mouth once daily   Handout included with medication that includes: instructions for taking drug, dose, and frequency of taking this medication, including most common side effects. Patient stated family unaware of HIV diagnosis during visit 4/18 (family in room this afternoon).    Juliette Alcide, PharmD, BCPS, BCIDP Work Cell: [phone] 08/21/2022 4:23 PM

## 2022-08-21 NOTE — Progress Notes (Deleted)
Medication Samples have been provided to the patient.  Drug name: dolutegravir/lamivudine (Dovato)       Strength: /300mg         Qty: 28  LOT: [id number]   Exp.Date: 03/2024  Dosing instructions: 1 tablet by mouth once daily   The patient has been instructed regarding the correct time, dose, and frequency of taking this medication, including desired effects and most common side effects.   Juliette Alcide, PharmD, BCPS, BCIDP Work Cell: [phone] 08/21/2022 2:49 PM

## 2022-08-21 NOTE — TOC Progression Note (Addendum)
Transition of Care [facility]) - Progression Note    Patient Details  Name: Charles Daniel MRN: [id number] Date of Birth: 06-04-1940  Transition of [facility]) CM/SW Contact  Truddie Hidden, RN Phone Number: 08/21/2022, 2:38 PM  Clinical Narrative:    Attempt to speak with patient's wife regarding HH recommendation for [facility] PT and RW. Unable to leave a message. No family at bedside.   Attempt to reach [name], patient's daughter. No answer. Left a message.         Expected Discharge Plan and Services                                               Social Determinants of Health (SDOH) Interventions SDOH Screenings   Depression (PHQ2-9): Low Risk  (11/11/2021)  Tobacco Use: Medium Risk (08/19/2022)    Readmission Risk Interventions     No data to display

## 2022-08-22 DIAGNOSIS — R0602 Shortness of breath: Secondary | ICD-10-CM | POA: Diagnosis not present

## 2022-08-22 LAB — CULTURE, BLOOD (ROUTINE X 2)

## 2022-08-22 MED ORDER — AZITHROMYCIN 250 MG PO TABS: ORAL_TABLET | ORAL | 0 refills | Status: DC

## 2022-08-22 MED ORDER — ALBUTEROL SULFATE HFA 108 (90 BASE) MCG/ACT IN AERS: 2.0000 | INHALATION_SPRAY | Freq: Four times a day (QID) | RESPIRATORY_TRACT | 2 refills | Status: DC | PRN

## 2022-08-22 MED ORDER — LISINOPRIL 20 MG PO TABS
40.0000 mg | ORAL_TABLET | Freq: Every day | ORAL | Status: DC
  Administered 2022-08-22: 40 mg via ORAL
  Filled 2022-08-22: qty 2

## 2022-08-22 MED ORDER — LISINOPRIL-HYDROCHLOROTHIAZIDE 20-12.5 MG PO TABS: 2.0000 | ORAL_TABLET | Freq: Every day | ORAL | 3 refills | Status: DC

## 2022-08-22 MED ORDER — AMOXICILLIN-POT CLAVULANATE 875-125 MG PO TABS: 1.0000 | ORAL_TABLET | Freq: Two times a day (BID) | ORAL | 0 refills | Status: AC

## 2022-08-22 MED ORDER — ENSURE ENLIVE PO LIQD: 237.0000 mL | Freq: Two times a day (BID) | ORAL | 12 refills | Status: DC

## 2022-08-22 MED ORDER — PREDNISONE 20 MG PO TABS: 20.0000 mg | ORAL_TABLET | Freq: Every day | ORAL | 0 refills | Status: AC

## 2022-08-22 MED ORDER — DOLUTEGRAVIR-LAMIVUDINE 50-300 MG PO TABS: 1.0000 | ORAL_TABLET | Freq: Every day | ORAL | 0 refills | Status: DC

## 2022-08-22 MED ORDER — AMLODIPINE BESYLATE 10 MG PO TABS: 10.0000 mg | ORAL_TABLET | Freq: Every day | ORAL | 1 refills | Status: DC

## 2022-08-22 NOTE — Discharge Summary (Signed)
Physician Discharge Summary   Patient: Charles Daniel MRN: [id number] DOB: 09/17/1940  Admit date:     08/18/2022  Discharge date: 08/22/22  Discharge Physician: Enedina Finner   PCP: [name], MD   Recommendations at discharge:    F/u [facility] in [place]. Make appt if you do not have follow up F/u PCP in 1-2 weeks  Discharge Diagnoses: Principal Problem:   SOB (shortness of breath) Active Problems:   HIV infection   SYPHILIS   HTN (hypertension), malignant   COPD exacerbation   COPD with emphysema   History of ischemic stroke   Tobacco use   CKD (chronic kidney disease) stage 3, GFR 30-59 ml/min   Dizziness   Severe sepsis   Acute lactic acidosis   Sepsis   Acute on chronic respiratory failure with hypoxia   AKI (acute kidney injury)   Acute hypoxemic respiratory failure  Charles Daniel is a 82 y.o. male with a history of hypertension, stroke, emphysema, HIV, tuberculosis who comes ED complaining of generalized weakness, shortness of breath with walking, worse for the past 2 days.  Started after the patient was mowing grass 4 days ago    Acute on chronic hypoxic respiratory failure secondary to COPD exacerbation history of emphysema history of smoking appears remote -- came in with increasing shortness of breath after moving grass four days ago. No fever. Per history patient does not use oxygen at home. Has history of smoking. -- Significant bronchospasm--improved. -- On IV Solu-Medrol BID--change to oral, bronchodilator, nebulizer -- ABG looks stable -- pulmonary consultation with Dr.aleskerov appreciated -- received dose of magnesium sulfate -- seen by speech therapy. Started on.Diet tolerating it well. --f/u Dr Karna Christmas   Severe sepsis suspected due to bronchitis as noted on CT chest -- came in with shortness of breath, tachycardia, elevated white count, abnormal CT chest -- continue broad-spectrum antibiotic--change to po augmentin and zithormax --wbc  trending down -- sepsis improved   Acute metabolic encephalopathy in the setting of hypoxia and sepsis -- patient was quite combative/agitated. Received Ativan and Haldol -- MRI brain negative -- PRN Haldol -- mentation much improved   Malignant hypertension -- BP elevated in the setting of combativeness/agitation -- IV hydralazine, IV labetalol PRN till patient able to take oral meds -- at home patient on amlodipine, lisinopril/hydrochlorothiazide--now resumed and dosing adjusted -- labs stable   At risk for aspiration -- speech therapy-- input appreciated.   History of HIV -- seen by infectious disease Dr. Joylene Draft. Recommends change in HIV therapy. She will get in touch with patient's outpatient infectious disease specialist. --pt has been given samples of Dovato. HE is advised to f/u with [facility] in [place]   Acute renal failure in the setting of sepsis -- baseline creatinine 1.09 -- came in with creatinine of 1.42--1.09 -- received IV fluids and avoid nephrotoxic agents   Patient is IV infiltrated. IV site swollen. Ice bag and PRN pain meds.  Overall imp[roved D/c home with HHPT     Procedures:none Family communication :none today. Family aware Consults : pulmonary, ID CODE STATUS: full    Diet recommendation:  Discharge Diet Orders (From admission, onward)     Start     Ordered   08/22/22 0000  Diet - low sodium heart healthy        08/22/22 1002            DISCHARGE MEDICATION: Allergies as of 08/22/2022   No Known Allergies      Medication List  STOP taking these medications    atorvastatin 40 MG tablet Commonly known as: LIPITOR   Genvoya 150-150-200-10 MG Tabs tablet Generic drug: elvitegravir-cobicistat-emtricitabine-tenofovir       TAKE these medications    albuterol 108 (90 Base) MCG/ACT inhaler Commonly known as: VENTOLIN HFA Inhale 2 puffs into the lungs every 6 (six) hours as needed for wheezing or shortness of breath.    amLODipine 10 MG tablet Commonly known as: NORVASC Take 1 tablet (10 mg total) by mouth daily. What changed:  medication strength how much to take   amoxicillin-clavulanate 875-125 MG tablet Commonly known as: AUGMENTIN Take 1 tablet by mouth every 12 (twelve) hours for 3 days.   azithromycin 250 MG tablet Commonly known as: ZITHROMAX Take daily as directed   dolutegravir-lamiVUDine 50-300 MG tablet Commonly known as: DOVATO Take 1 tablet by mouth daily.   feeding supplement Liqd Take 237 mLs by mouth 2 (two) times daily between meals.   lisinopril-hydrochlorothiazide 20-12.5 MG tablet Commonly known as: ZESTORETIC Take 2 tablets by mouth daily. What changed: how much to take   predniSONE 20 MG tablet Commonly known as: DELTASONE Take 1 tablet (20 mg total) by mouth daily with breakfast for 2 days.   Vitamin D3 25 MCG (1000 UT) Caps Take 1 capsule (1,000 Units total) by mouth daily.               Durable Medical Equipment  (From admission, onward)           Start     Ordered   08/21/22 1234  For home use only DME Nebulizer machine  Once       Question Answer Comment  Patient needs a nebulizer to treat with the following condition COPD (chronic obstructive pulmonary disease)   Length of Need Lifetime      08/21/22 1233   08/20/22 1412  For home use only DME Walker rolling  Once       Question Answer Comment  Walker: With 5 Inch Wheels   Patient needs a walker to treat with the following condition Other abnormalities of gait and mobility      08/20/22 1411            Follow-up Information     [facility]. Go to.   Specialty: Infectious Diseases Why: on the appointment that youi have. if you dont have appt please call the number and make one Contact information: [address] [id number] mc [place] [address] [phone]        [name], MD. Schedule an appointment as soon as possible for a  visit in 1 week(s).   Specialty: Family Medicine Why: hospital f/u Contact information: [address] [place] [address] [phone]                Discharge Exam: Ceasar Mons Weights   08/20/22 0315 08/21/22 0538 08/22/22 0523  Weight: 63.3 kg 63.5 kg 60.6 kg   GENERAL:  82 y.o.-year-old patient with mild to mod acute distress.  LUNGS: Normal breath sounds bilaterally, no wheezingg CARDIOVASCULAR: S1, S2 normal. No murmur  ABDOMEN: Soft, nontender, nondistended. Bowel sounds present.  EXTREMITIES: No  edema b/l.    NEUROLOGIC: nonfocal  patient is alert and more oriented today SKIN: No obvious rash, lesion, or ulcer.   Condition at discharge: fair  The results of significant diagnostics from this hospitalization (including imaging, microbiology, ancillary and laboratory) are listed below for reference.   Imaging Studies: DG  Chest Port 1 View  Result Date: 08/19/2022 CLINICAL DATA:  Bronchospasm EXAM: PORTABLE CHEST 1 VIEW COMPARISON:  X-ray 08/18/2022 and older FINDINGS: No consolidation, pneumothorax or effusion. No edema. Tortuous and ectatic aorta. Normal cardiopericardial silhouette. Overlapping cardiac leads. Film is rotated to the left. Eventration of the right hemidiaphragm IMPRESSION: No acute cardiopulmonary disease Electronically Signed   By: Karen Kays M.D.   On: 08/19/2022 15:09   CT ABDOMEN PELVIS WO CONTRAST  Result Date: 08/19/2022 CLINICAL DATA:  Kidney failure, acute EXAM: CT ABDOMEN AND PELVIS WITHOUT CONTRAST TECHNIQUE: Multidetector CT imaging of the abdomen and pelvis was performed following the standard protocol without IV contrast. RADIATION DOSE REDUCTION: This exam was performed according to the departmental dose-optimization program which includes automated exposure control, adjustment of the mA and/or kV according to patient size and/or use of iterative reconstruction technique. COMPARISON:  CT abdomen pelvis 08/18/2007, CT abdomen pelvis  10/11/2019 FINDINGS: Lower chest: Trace right pleural effusion.  Tiny hiatal hernia. Hepatobiliary: No focal liver abnormality. No gallstones, gallbladder wall thickening, or pericholecystic fluid. No biliary dilatation. Pancreas: No focal lesion. Normal pancreatic contour. No surrounding inflammatory changes. No main pancreatic ductal dilatation. Spleen: Normal in size without focal abnormality. Adrenals/Urinary Tract: No adrenal nodule bilaterally. No nephrolithiasis and no hydronephrosis. No definite contour-deforming renal mass. No ureterolithiasis or hydroureter. The urinary bladder is unremarkable. Excretion of previously administered intravenous contrast noted within bilateral collecting systems. Stomach/Bowel: Surgical changes related to rectosigmoid resection. Stomach is within normal limits. No evidence of bowel wall thickening or dilatation. Appendix appears normal. Vascular/Lymphatic: No abdominal aorta or iliac aneurysm. Severe atherosclerotic plaque of the aorta and its branches. No abdominal, pelvic, or inguinal lymphadenopathy. Reproductive: Radiation seeds noted along the expected region of the prostate. Other: No intraperitoneal free fluid. No intraperitoneal free gas. No organized fluid collection. Musculoskeletal: No abdominal wall hernia or abnormality. No suspicious lytic or blastic osseous lesions. No acute displaced fracture. Redemonstration of age-indeterminate T11, L1, L2 compression fractures. IMPRESSION: 1. No acute intra-abdominal or intrapelvic abnormality with limited evaluation on this noncontrast study. 2. Excretion of previously administered intravenous contrast noted within bilateral collecting systems. 3. Other imaging findings of potential clinical significance: Tiny hiatal hernia. Trace right pleural effusion. Aortic Atherosclerosis (ICD10-I70.0). Electronically Signed   By: Tish Frederickson M.D.   On: 08/19/2022 01:45   MR BRAIN WO CONTRAST  Result Date: 08/19/2022 CLINICAL  DATA:  Altered mental status EXAM: MRI HEAD WITHOUT CONTRAST TECHNIQUE: Multiplanar, multiecho pulse sequences of the brain and surrounding structures were obtained without intravenous contrast. COMPARISON:  None Available. FINDINGS: The brain is partially obscured by susceptibility artifacts related to metallic foreign body. There is also patient motion. Brain: No acute infarct, mass effect or extra-axial collection. Chronic microhemorrhage in the right cerebellum. There is confluent hyperintense T2-weighted signal within the white matter. Generalized volume loss. The midline structures are normal. Vascular: Major flow voids are preserved. Skull and upper cervical spine: Normal calvarium and skull base. Visualized upper cervical spine and soft tissues are normal. Sinuses/Orbits:No paranasal sinus fluid levels or advanced mucosal thickening. No mastoid or middle ear effusion. Normal orbits. IMPRESSION: 1. No acute intracranial abnormality. 2. Findings of chronic small vessel ischemia and volume loss. Electronically Signed   By: Deatra Robinson M.D.   On: 08/19/2022 00:56   CT Head Wo Contrast  Result Date: 08/18/2022 CLINICAL DATA:  Mental status change, unknown cause EXAM: CT HEAD WITHOUT CONTRAST TECHNIQUE: Contiguous axial images were obtained from the base of the skull through  the vertex without intravenous contrast. RADIATION DOSE REDUCTION: This exam was performed according to the departmental dose-optimization program which includes automated exposure control, adjustment of the mA and/or kV according to patient size and/or use of iterative reconstruction technique. COMPARISON:  MRI head 05/15/2019, CT head 05/15/2019, PET CT 02/20/2022 FINDINGS: Brain: Patchy and confluent areas of decreased attenuation are noted throughout the deep and periventricular white matter of the cerebral hemispheres bilaterally, compatible with chronic microvascular ischemic disease. Redemonstration of right frontotemporal  encephalomalacia. No evidence of large-territorial acute infarction. No parenchymal hemorrhage. No mass lesion. No extra-axial collection. No mass effect or midline shift. No hydrocephalus. Basilar cisterns are patent. Vascular: No hyperdense vessel. Skull: No acute fracture or focal lesion. Similar-appearing metallic density along the left scalp. Sinuses/Orbits: Sphenoid, ethmoid, maxillary sinus mucosal thickening. Paranasal sinuses and mastoid air cells are clear. The orbits are unremarkable. Other: None. IMPRESSION: No acute intracranial abnormality. Electronically Signed   By: Tish Frederickson M.D.   On: 08/18/2022 21:43   CT Angio Chest PE W and/or Wo Contrast  Result Date: 08/18/2022 CLINICAL DATA:  Shortness of breath since last night. History of COPD. PE suspected EXAM: CT ANGIOGRAPHY CHEST WITH CONTRAST TECHNIQUE: Multidetector CT imaging of the chest was performed using the standard protocol during bolus administration of intravenous contrast. Multiplanar CT image reconstructions and MIPs were obtained to evaluate the vascular anatomy. RADIATION DOSE REDUCTION: This exam was performed according to the departmental dose-optimization program which includes automated exposure control, adjustment of the mA and/or kV according to patient size and/or use of iterative reconstruction technique. CONTRAST:  75mL OMNIPAQUE IOHEXOL 350 MG/ML SOLN COMPARISON:  Radiographs 08/18/2022 and report from CT chest 08/18/2007 FINDINGS: Cardiovascular: Satisfactory opacification of the pulmonary arteries to the segmental level. No pulmonary embolism. No evidence of acute aortic syndrome. Coronary artery and aortic atherosclerotic calcification. No pericardial effusion. Mediastinum/Nodes: Small hiatal hernia. Esophagus is unremarkable. 1.2 cm right hilar node (4/64). Otherwise no thoracic adenopathy. Lungs/Pleura: Bibasilar scarring/atelectasis. Within the posterolateral left lower lobe there is an area of more nodular  consolidation measuring 2.0 x 1.0 cm (5/104). Mild bronchial wall thickening and mucous plugging greatest in the lower lobes. Mild right apical scarring. No pleural effusion or pneumothorax. Upper Abdomen: No acute abnormality. Musculoskeletal: Age-indeterminate compression deformities of T4, T8, T11, L1, and L2. These may be chronic however there is not a good recent comparison to confirm chronicity. No rib fractures. Review of the MIP images confirms the above findings. IMPRESSION: 1. No acute pulmonary embolism. 2. Mild bronchial wall thickening and mucous plugging greatest in the lower lobes. 3. Within the posterolateral left lower lobe there is an area of nodular consolidation measuring 2.0 x 1.0 cm. This may be related to chronic scarring however follow-up is recommended to ensure stability. Consider one of the following in 3 months for both low-risk and high-risk individuals: (a) repeat chest CT, (b) follow-up PET-CT, or (c) tissue sampling. This recommendation follows the consensus statement: Guidelines for Management of Incidental Pulmonary Nodules Detected on CT Images: From the Fleischner Society 2017; Radiology 2017; 284:228-243. 4. Age-indeterminate compression deformities of T4, T8, T11, L1, and L2. These may be chronic however there is not a good recent comparison to confirm chronicity. Correlate with areas of pain and consider dedicated workup when clinically appropriate. Aortic Atherosclerosis (ICD10-I70.0). Electronically Signed   By: Minerva Fester M.D.   On: 08/18/2022 19:46   DG Chest 2 View  Result Date: 08/18/2022 CLINICAL DATA:  Shortness of breath EXAM: CHEST - 2  VIEW COMPARISON:  X-ray 05/29/2021 FINDINGS: Underinflation. No consolidation, pneumothorax or effusion. No edema. Normal cardiopericardial silhouette with calcified and tortuous aorta. There are some compression deformities of the mid and lower thoracic spine on the lateral view which are new from 2023. Etiology is uncertain.  Please correlate for any known history or dedicated workup when appropriate. Patient does have a history of prostate cancer. IMPRESSION: Underinflation.  No consolidation. There is slight compression of mid and lower thoracic spine vertebral levels which was not seen on x-ray of January 2023. etiology is uncertain. Dedicated workup when clinically appropriate Electronically Signed   By: Karen Kays M.D.   On: 08/18/2022 17:29    Microbiology: Results for orders placed or performed during the hospital encounter of 08/18/22  Culture, blood (Routine X 2) w Reflex to ID Panel     Status: None (Preliminary result)   Collection Time: 08/19/22  1:10 AM   Specimen: BLOOD  Result Value Ref Range Status   Specimen Description BLOOD RIGHT ARM  Final   Special Requests   Final    BOTTLES DRAWN AEROBIC AND ANAEROBIC Blood Culture adequate volume   Culture   Final    NO GROWTH 3 DAYS Performed at [facility], [address], [place], [place] [address]    Report Status PENDING  Incomplete  Culture, blood (Routine X 2) w Reflex to ID Panel     Status: None (Preliminary result)   Collection Time: 08/19/22  1:10 AM   Specimen: BLOOD  Result Value Ref Range Status   Specimen Description BLOOD LEFT ARM  Final   Special Requests   Final    BOTTLES DRAWN AEROBIC AND ANAEROBIC Blood Culture results may not be optimal due to an inadequate volume of blood received in culture bottles   Culture   Final    NO GROWTH 3 DAYS Performed at [facility], [address] [place], [place] [address]    Report Status PENDING  Incomplete  Respiratory (~20 pathogens) panel by PCR     Status: None   Collection Time: 08/19/22  3:14 PM   Specimen: Nasopharyngeal Swab; Respiratory  Result Value Ref Range Status   Adenovirus NOT DETECTED NOT DETECTED Final   Coronavirus 229E NOT DETECTED NOT DETECTED Final    Comment: (NOTE) The Coronavirus on the Respiratory Panel, DOES NOT test for the novel   Coronavirus (2019 nCoV)    Coronavirus HKU1 NOT DETECTED NOT DETECTED Final   Coronavirus NL63 NOT DETECTED NOT DETECTED Final   Coronavirus OC43 NOT DETECTED NOT DETECTED Final   Metapneumovirus NOT DETECTED NOT DETECTED Final   Rhinovirus / Enterovirus NOT DETECTED NOT DETECTED Final   Influenza A NOT DETECTED NOT DETECTED Final   Influenza B NOT DETECTED NOT DETECTED Final   Parainfluenza Virus 1 NOT DETECTED NOT DETECTED Final   Parainfluenza Virus 2 NOT DETECTED NOT DETECTED Final   Parainfluenza Virus 3 NOT DETECTED NOT DETECTED Final   Parainfluenza Virus 4 NOT DETECTED NOT DETECTED Final   Respiratory Syncytial Virus NOT DETECTED NOT DETECTED Final   Bordetella pertussis NOT DETECTED NOT DETECTED Final   Bordetella Parapertussis NOT DETECTED NOT DETECTED Final   Chlamydophila pneumoniae NOT DETECTED NOT DETECTED Final   Mycoplasma pneumoniae NOT DETECTED NOT DETECTED Final    Comment: Performed at [facility], [address], [place], [place] [address]  SARS Coronavirus 2 by RT PCR (hospital order, performed in [facility] hospital lab) *cepheid single result test* Nasopharyngeal Swab  Status: None   Collection Time: 08/19/22  3:14 PM   Specimen: Nasopharyngeal Swab; Nasal Swab  Result Value Ref Range Status   SARS Coronavirus 2 by RT PCR NEGATIVE NEGATIVE Final    Comment: (NOTE) SARS-CoV-2 target nucleic acids are NOT DETECTED.  The SARS-CoV-2 RNA is generally detectable in upper and lower respiratory specimens during the acute phase of infection. The lowest concentration of SARS-CoV-2 viral copies this assay can detect is 250 copies / mL. A negative result does not preclude SARS-CoV-2 infection and should not be used as the sole basis for treatment or other patient management decisions.  A negative result may occur with improper specimen collection / handling, submission of specimen other than nasopharyngeal swab, presence of viral mutation(s) within  the areas targeted by this assay, and inadequate number of viral copies (<250 copies / mL). A negative result must be combined with clinical observations, patient history, and epidemiological information.  Fact Sheet for Patients:   RoadLapTop.co.za  Fact Sheet for Healthcare Providers: http://kim-miller.com/  This test is not yet approved or  cleared by the [place] FDA and has been authorized for detection and/or diagnosis of SARS-CoV-2 by FDA under an Emergency Use Authorization (EUA).  This EUA will remain in effect (meaning this test can be used) for the duration of the COVID-19 declaration under Section 564(b)(1) of the Act, 21 U.S.C. section 360bbb-3(b)(1), unless the authorization is terminated or revoked sooner.  Performed at [facility], [address] [place], [place] [address]     Labs: CBC: Recent Labs  Lab 08/18/22 1621 08/19/22 0610 08/21/22 0522  WBC 20.0* 20.9*  21.4* 20.3*  NEUTROABS  --  19.0*  19.7*  --   HGB 13.5 11.7*  11.6* 10.6*  HCT 41.9 35.4*  35.1* 32.5*  MCV 88.6 86.8  88 86.0  PLT 182 150  172 149*   Basic Metabolic Panel: Recent Labs  Lab 08/18/22 1621 08/19/22 0233 08/19/22 0610 08/21/22 0522  NA 135  --  137 139  K 4.4  --  4.9 4.0  CL 102  --  105 105  CO2 22  --  22 28  GLUCOSE 114*  --  154* 133*  BUN 15  --  21 29*  CREATININE 1.49*  --  1.42* 1.02  CALCIUM 9.2  --  8.8* 8.6*  MG  --  2.1  --   --   PHOS  --  4.1  --   --    Liver Function Tests: Recent Labs  Lab 08/18/22 1621 08/19/22 0610  AST 53* 67*  ALT 27 26  ALKPHOS 84 68  BILITOT 1.0 1.2  PROT 7.8 7.1  ALBUMIN 3.9 3.5   CBG: Recent Labs  Lab 08/19/22 0750 08/21/22 0821 08/21/22 1341 08/21/22 1827  GLUCAP 112* 122* 130* 114*    Discharge time spent: greater than 30 minutes.  Signed: Enedina Finner, MD [facility] 08/22/2022

## 2022-08-22 NOTE — TOC Progression Note (Signed)
Transition of Care [facility]) - Progression Note    Patient Details  Name: Charles Daniel MRN: [id number] Date of Birth: [birth year]  Transition of Care [facility]) CM/SW Contact  Ashley Royalty Lutricia Feil, RN Phone Number:[phone] 08/22/2022, 12:12 PM  Clinical Narrative:    TOC requested to contact the pt's daughter Oda Kilts. TOC RN attempted outreach however only able to leave a message requesting a call back.       Barriers to Discharge: No Barriers Identified  Expected Discharge Plan and Services         Expected Discharge Date: 08/22/22                 DME Agency: NA (Pt verified he already has a RW for ambulation and declined any additional DME at this time)         HH Agency:  (Verfied pt has existing HHealth with [facility] and will reume such services.)         Social Determinants of Health (SDOH) Interventions SDOH Screenings   Depression (PHQ2-9): Low Risk  (11/11/2021)  Tobacco Use: Medium Risk (08/19/2022)    Readmission Risk Interventions     No data to display

## 2022-08-22 NOTE — TOC Transition Note (Signed)
Transition of Care [facility]) - CM/SW Discharge Note   Patient Details  Name: Charles Daniel MRN: [id number] Date of Birth: 11/22/1940  Transition of Care [facility]) CM/SW Contact:  Luvenia Redden, RN Phone Number:[phone] 08/22/2022, 11:17 AM   Clinical Narrative:    Spoke with pt directly as wife is currently in the hospital. Pt agreeable to resuming [facility] for [facility] and already has the recommended RW for ambulation. States his son Charles Daniel will be assisting with transportation to all medical appointments. Pt states he receives his medications from the [facility] on [address]. No other needs presented at this time.  TOC remains available to assist for any other needs presented.   Final next level of care: Home w Home Health Services Barriers to Discharge: No Barriers Identified   Patient Goals and CMS Choice      Discharge Placement                      Patient and family notified of of transfer: 08/22/22 (Spoke with pt who indicates his son Charles Daniel) will be assisting today with transportation.)  Discharge Plan and Services Additional resources added to the After Visit Summary for                    DME Agency: NA (Pt verified he already has a RW for ambulation and declined any additional DME at this time)         HH Agency:  (Verfied pt has existing HHealth with [facility] and will reume such services.)        Social Determinants of Health (SDOH) Interventions SDOH Screenings   Depression (PHQ2-9): Low Risk  (11/11/2021)  Tobacco Use: Medium Risk (08/19/2022)     Readmission Risk Interventions     No data to display

## 2022-08-22 NOTE — TOC Progression Note (Signed)
Transition of Care [facility]) - Progression Note    Patient Details  Name: Charles Daniel MRN: [id number] Date of Birth: March 27, 1941  Transition of Care [facility]) CM/SW Contact  Ashley Royalty Lutricia Feil, RN Phone Number: [phone] 08/22/2022, 1:10 PM  Clinical Narrative:    [facility] RN requested to speak with the daughter and son. TOC had a long conversation with both Charles Daniel and Charles Daniel concerning the pt's needs related to all recommendations with PT/OT and speech recommending full supervision with feeding and administering medications. TOC RN doesn't think they are available to assist this pt with the requested supervision related this his feedings. TOC RN have encouraged these family members to request other family to assist with the needed supervision for this pt. TOC RN has provided other options for pt. Inquired on if pt can be discharged to their home [place] and [place]) (declined), cover the out of pocket cost to facility placement (declined) or to provide a list of available agencies for private pay aide services (receptive-will add to discharge packet). Discussed if their is no one available to assist as informed by [facility] discussed APS and this was not an option. Charles Daniel requested TOC RN deliver the RW to his home residence but again does not wish to discharge pt to his home as a caregiver. TOC also provided [facility] to assist with LTC placement if this is something they wish to pursue (this information ws left on voice message).   Addendum 1:45pm: Spoke with Charles Daniel (daughter) who provided another information indicated that are 3 other daughter in [place] however limited with available assistance. States Charles Daniel has provided pt's meal in the past with soft (pureed) food that is tolerable. Daughter is receptive to the private sitters information list and a referral to [facility] to possible assist with future placement. Charles Daniel was very grateful and appreciative for all the information offered  prior to the call ending today. No other request or needs requested at this time.  REFERRALS:  As requested RW to be delivered to the address provided: [address], [place], [place] [address]  Referral made with [facility]) to delivery to the address noted above.  [facility] Charles Daniel) requested family assistance with possible future placement for ALF or LTC placement. Provided Charles Daniel [phone]  TOC remains available to assist with any other request.     Barriers to Discharge: No Barriers Identified  Expected Discharge Plan and Services         Expected Discharge Date: 08/22/22                 DME Agency: NA (Pt verified he already has a RW for ambulation and declined any additional DME at this time)         HH Agency:  (Verfied pt has existing [facility] with [facility] and will reume such services.)         Social Determinants of Health (SDOH) Interventions SDOH Screenings   Depression (PHQ2-9): Low Risk  (11/11/2021)  Tobacco Use: Medium Risk (08/19/2022)    Readmission Risk Interventions     No data to display

## 2022-08-23 LAB — CULTURE, BLOOD (ROUTINE X 2): Special Requests: ADEQUATE

## 2022-08-24 ENCOUNTER — Other Ambulatory Visit (HOSPITAL_COMMUNITY): Payer: Self-pay

## 2022-08-25 ENCOUNTER — Telehealth: Payer: Self-pay | Admitting: Family Medicine

## 2022-08-25 ENCOUNTER — Telehealth: Payer: Self-pay

## 2022-08-25 NOTE — Transitions of Care (Post Inpatient/ED Visit) (Signed)
   08/25/2022  Name: Charles Daniel MRN: [id number] DOB: 20-Apr-1941  Today's TOC FU Call Status: Today's TOC FU Call Status:: Successful TOC FU Call Competed TOC FU Call Complete Date: 08/25/22  Transition Care Management Follow-up Telephone Call Date of Discharge: 08/22/22 Discharge Facility: [facility]) Type of Discharge: Inpatient Admission Primary Inpatient Discharge Diagnosis:: Shortness of breath How have you been since you were released from the hospital?: Same Any questions or concerns?: No  Items Reviewed: Did you receive and understand the discharge instructions provided?: Yes Medications obtained and verified?: Yes (Medications Reviewed) Any new allergies since your discharge?: No Dietary orders reviewed?: NA Do you have support at home?: Yes People in Home: spouse  Home Care and Equipment/Supplies: Were Home Health Services Ordered?: No Any new equipment or medical supplies ordered?: No  Functional Questionnaire: Do you need assistance with bathing/showering or dressing?: No Do you need assistance with meal preparation?: No Do you need assistance with eating?: No Do you have difficulty maintaining continence: No Do you need assistance with getting out of bed/getting out of a chair/moving?: No Do you have difficulty managing or taking your medications?: No  Follow up appointments reviewed: PCP Follow-up appointment confirmed?: Yes Date of PCP follow-up appointment?: 09/04/22 Follow-up Provider: Dr. Sharen Hones [facility] Follow-up appointment confirmed?: Yes Date of Specialist follow-up appointment?: 09/03/22 Follow-Up Specialty Provider:: Infectious Disease Do you need transportation to your follow-up appointment?: No Do you understand care options if your condition(s) worsen?: Yes-patient verbalized understanding    SIGNATURE Kandis Fantasia, LPN [facility] Health Advisor [facility] You Are. We Are.  [employer] # [phone]

## 2022-08-25 NOTE — Telephone Encounter (Signed)
Home Health verbal orders Caller Name:Cindy  Agency Name: [facility]   Callback number: [phone], secured   Requesting OT/PT/Skilled nursing/Social Work/Speech: PT , speech therapy eval   Reason:  Frequency: two week four , one week five   Please forward to [facility] pool or providers CMA

## 2022-08-25 NOTE — Telephone Encounter (Signed)
Agree with this. Thanks.  

## 2022-08-26 LAB — BLOOD GAS, ARTERIAL
O2 Content: 4 L/min
O2 Saturation: 100 %
pO2, Arterial: 143 mmHg — ABNORMAL HIGH (ref 83–108)

## 2022-08-26 NOTE — Telephone Encounter (Signed)
Called Cindy with [facility] and advised her that Dr. Sharen Hones agrees with the verbal orders.

## 2022-08-27 ENCOUNTER — Telehealth: Payer: Self-pay | Admitting: Family Medicine

## 2022-08-27 LAB — GENOSURE PRIME (GSPRIL)

## 2022-08-27 NOTE — Telephone Encounter (Signed)
Unable to reach pt by phone; unable to reach pts wife by phone and no answer and no v/m on either call. I called another number and spoke with pt; pt said a speech therapist was at his home earlier today but pt said he has not and is not having any difficulty breathing or wheezing. Pt said the speech therapist misinformed [place]. Pt said he already has HFU appt to see Dr Reece Agar on 09/04/22. UC & ED precautions given and pt voiced understanding. Pt said if anything changed he would let Dr Reece Agar know and he appreciated the call. While talking with pt on phone did not hear any wheezing or SOB noted while talking with pt and pt was speaking in clear complete sentences. Sending note to Dr Reece Agar who is out of office and Sharen Hones pool.

## 2022-08-27 NOTE — Telephone Encounter (Signed)
Speech Therapist from [facility] called in after a visit with patient stating that he is having wheezing on exacerbation,and he usually does not wheeze.He had just been released from the hospital due to bronchitis. He is scheduled for hospital f/u on May 3.2024.

## 2022-08-28 NOTE — Telephone Encounter (Signed)
Noted. Will assess at OV next week.

## 2022-09-03 ENCOUNTER — Ambulatory Visit: Payer: Medicare Other | Admitting: Internal Medicine

## 2022-09-03 ENCOUNTER — Ambulatory Visit: Payer: Medicare Other

## 2022-09-04 ENCOUNTER — Ambulatory Visit
Admission: RE | Admit: 2022-09-04 | Discharge: 2022-09-04 | Disposition: A | Discharge status: Home / Self Care | Payer: Medicare Other | Attending: Family Medicine | Admitting: Family Medicine

## 2022-09-04 ENCOUNTER — Ambulatory Visit
Admission: RE | Admit: 2022-09-04 | Discharge: 2022-09-04 | Disposition: A | Discharge status: Home / Self Care | Payer: Medicare Other | Source: Ambulatory Visit | Attending: Family Medicine | Admitting: Family Medicine

## 2022-09-04 ENCOUNTER — Encounter: Payer: Self-pay | Admitting: Family Medicine

## 2022-09-04 ENCOUNTER — Ambulatory Visit (INDEPENDENT_AMBULATORY_CARE_PROVIDER_SITE_OTHER): Payer: Medicare Other | Admitting: Family Medicine

## 2022-09-04 VITALS — BP 122/64 | HR 72 | Temp 97.4°F | Ht 65.0 in | Wt 130.5 lb

## 2022-09-04 DIAGNOSIS — C771 Secondary and unspecified malignant neoplasm of intrathoracic lymph nodes: Secondary | ICD-10-CM

## 2022-09-04 DIAGNOSIS — J439 Emphysema, unspecified: Secondary | ICD-10-CM

## 2022-09-04 DIAGNOSIS — I10 Essential (primary) hypertension: Secondary | ICD-10-CM

## 2022-09-04 DIAGNOSIS — M25511 Pain in right shoulder: Secondary | ICD-10-CM | POA: Insufficient documentation

## 2022-09-04 DIAGNOSIS — J441 Chronic obstructive pulmonary disease with (acute) exacerbation: Secondary | ICD-10-CM | POA: Diagnosis not present

## 2022-09-04 DIAGNOSIS — R9721 Rising PSA following treatment for malignant neoplasm of prostate: Secondary | ICD-10-CM

## 2022-09-04 DIAGNOSIS — H9190 Unspecified hearing loss, unspecified ear: Secondary | ICD-10-CM

## 2022-09-04 DIAGNOSIS — B20 Human immunodeficiency virus [HIV] disease: Secondary | ICD-10-CM

## 2022-09-04 DIAGNOSIS — C61 Malignant neoplasm of prostate: Secondary | ICD-10-CM

## 2022-09-04 DIAGNOSIS — Z8673 Personal history of transient ischemic attack (TIA), and cerebral infarction without residual deficits: Secondary | ICD-10-CM

## 2022-09-04 DIAGNOSIS — R413 Other amnesia: Secondary | ICD-10-CM

## 2022-09-04 NOTE — Progress Notes (Unsigned)
Ph: [phone]       Fax: [phone]   Patient ID: Charles Daniel, male    DOB: 18-Sep-1940, 82 y.o.   MRN: [id number]  This visit was conducted in person.  BP 122/64   Pulse 72   Temp (!) 97.4 F (36.3 C) (Temporal)   Ht 5\' 5"  (1.651 m)   Wt 130 lb 8 oz (59.2 kg)   SpO2 96%   BMI 21.72 kg/m   Hearing Screening   500Hz  1000Hz  2000Hz  4000Hz   Right ear 20 40 20 20  Left ear 20 40 0 0   CC: hosp f/u visit  Subjective:   HPI: Charles Daniel is a 82 y.o. male presenting on 09/04/2022 for Hospitalization Follow-up (Admitted on 08/18/22 at [facility], dx COPD exacerbation; community acquired PNA; HIV inf; dizziness. Pt accompanied by stepson, Tyrone. )   Recent hospitalization for shortness of breath, found to have hypoxic COPD exacerbation treated with IV solumedrol BID transitioned to oral prednisone, as well as bronchodilator nebulizer. He also received a dose of Magnesium sulfate.  Discharged on augmentin and azithromycin.  Was actually very agitated due to acute metabolic encephalopathy - received haldol and ativan. MRI brain negative for acute process.  BP markedly elevated - treated with IV antihypertensives, discharged on regularly regimen of amlodipine, lisinopril, hctz.  Pulmonology Karna Christmas) saw patient - no changes in regimen recommended.  Hospital records reviewed. Med rec performed.   Atorvastatin was discontinued as was Genvoya, in its place started on Dovato.   Rec f/u with ID clinic outpatient to consider change in antiretroviral treatment.   Notes R shoulder pain present over the past month. Denies inciting trauma/injury or falls.  He's been using ensure daily.   Since home, no fever, no cough, no wheezing  SIL notes he's having some memory difficulty - ie can't remember why he was in the hospital. Son in law notes occasional forgetfulness at home even prior to hospitalization.   Geriatric Assessment:  Activities of Daily Living:     Bathing- independent     Dressing- independent    Eating- independent    Toileting- independent    Transferring- independent    Continence- independent Overall Assessment: independent  Instrumental Activities of Daily Living:     Transportation- currently dependent - doesn't drive long distance    Meal/Food Preparation- dependent    Shopping Errands- dependent    Housekeeping/Chores- dependent    Money Management/Finances- independent    Medication Management- partially dependent     Ability to Use Telephone- independent    Laundry- dependent Overall Assessment: partially dependent  Mental Status Exam: 22/30     Clock Drawing Score: 1right/4       Home health set up with [facility]: PT, speech therapy.  Other follow up appointments scheduled: ID clinic 10/05/2022 (Dr Thedore Mins).  ______________________________________________________________________ Hospital admission: 08/18/2022 Hospital discharge: 08/22/2022 TCM f/u phone call:  performed 08/25/2022  Recommendations at discharge:    F/u [facility] in [place]. Make appt if you do not have follow up F/u PCP in 1-2 weeks   Discharge Diagnoses: Principal Problem:   SOB (shortness of breath) Active Problems:   HIV infection   SYPHILIS   HTN (hypertension), malignant   COPD exacerbation   COPD with emphysema   History of ischemic stroke   Tobacco use   CKD (chronic kidney disease) stage 3, GFR 30-59 ml/min   Dizziness   Severe sepsis   Acute lactic acidosis   Sepsis   Acute on chronic respiratory  failure with hypoxia   AKI (acute kidney injury)   Acute hypoxemic respiratory failure     Relevant past medical, surgical, family and social history reviewed and updated as indicated. Interim medical history since our last visit reviewed. Allergies and medications reviewed and updated. Outpatient Medications Prior to Visit  Medication Sig Dispense Refill   albuterol (VENTOLIN HFA) 108 (90 Base) MCG/ACT inhaler Inhale 2 puffs into the lungs every 6 (six)  hours as needed for wheezing or shortness of breath. 8 g 2   amLODipine (NORVASC) 10 MG tablet Take 1 tablet (10 mg total) by mouth daily. 30 tablet 1   Cholecalciferol (VITAMIN D3) 25 MCG (1000 UT) CAPS Take 1 capsule (1,000 Units total) by mouth daily. 30 capsule    dolutegravir-lamiVUDine (DOVATO) 50-300 MG tablet Take 1 tablet by mouth daily. 30 tablet 0   feeding supplement (ENSURE ENLIVE / ENSURE PLUS) LIQD Take 237 mLs by mouth 2 (two) times daily between meals. 237 mL 12   lisinopril-hydrochlorothiazide (ZESTORETIC) 20-12.5 MG tablet Take 2 tablets by mouth daily. 90 tablet 3   azithromycin (ZITHROMAX) 250 MG tablet Take daily as directed 2 each 0   No facility-administered medications prior to visit.     Per HPI unless specifically indicated in ROS section below Review of Systems  Objective:  BP 122/64   Pulse 72   Temp (!) 97.4 F (36.3 C) (Temporal)   Ht 5\' 5"  (1.651 m)   Wt 130 lb 8 oz (59.2 kg)   SpO2 96%   BMI 21.72 kg/m   Wt Readings from Last 3 Encounters:  09/04/22 130 lb 8 oz (59.2 kg)  08/22/22 133 lb 11.2 oz (60.6 kg)  04/01/22 131 lb (59.4 kg)      Physical Exam Vitals and nursing note reviewed.  Constitutional:      Appearance: Normal appearance. He is not ill-appearing.  HENT:     Mouth/Throat:     Mouth: Mucous membranes are moist.     Pharynx: Oropharynx is clear. No oropharyngeal exudate.  Eyes:     Extraocular Movements: Extraocular movements intact.     Conjunctiva/sclera: Conjunctivae normal.     Pupils: Pupils are equal, round, and reactive to light.     Comments: Cataracts present   Cardiovascular:     Rate and Rhythm: Normal rate and regular rhythm.     Pulses: Normal pulses.     Heart sounds: Normal heart sounds. No murmur heard. Pulmonary:     Effort: Pulmonary effort is normal. No respiratory distress.     Breath sounds: Normal breath sounds. No wheezing, rhonchi or rales.  Musculoskeletal:     Cervical back: Normal range of motion  and neck supple.     Right lower leg: No edema.     Left lower leg: No edema.  Lymphadenopathy:     Cervical: No cervical adenopathy.  Skin:    General: Skin is warm and dry.     Capillary Refill: Capillary refill takes less than 2 seconds.     Findings: No rash.  Neurological:     Mental Status: He is alert.  Psychiatric:        Mood and Affect: Mood normal.        Behavior: Behavior normal.       Results for orders placed or performed during the hospital encounter of 08/18/22  Culture, blood (Routine X 2) w Reflex to ID Panel   Specimen: BLOOD  Result Value Ref Range   Specimen Description  BLOOD RIGHT ARM    Special Requests      BOTTLES DRAWN AEROBIC AND ANAEROBIC Blood Culture adequate volume   Culture      NO GROWTH 5 DAYS Performed at [facility], [address] [place], [place] [address]    Report Status 08/24/2022 FINAL   Culture, blood (Routine X 2) w Reflex to ID Panel   Specimen: BLOOD  Result Value Ref Range   Specimen Description BLOOD LEFT ARM    Special Requests      BOTTLES DRAWN AEROBIC AND ANAEROBIC Blood Culture results may not be optimal due to an inadequate volume of blood received in culture bottles   Culture      NO GROWTH 5 DAYS Performed at [facility], [address], [place], [place] [address]    Report Status 08/24/2022 FINAL   Respiratory (~20 pathogens) panel by PCR   Specimen: Nasopharyngeal Swab; Respiratory  Result Value Ref Range   Adenovirus NOT DETECTED NOT DETECTED   Coronavirus 229E NOT DETECTED NOT DETECTED   Coronavirus HKU1 NOT DETECTED NOT DETECTED   Coronavirus NL63 NOT DETECTED NOT DETECTED   Coronavirus OC43 NOT DETECTED NOT DETECTED   Metapneumovirus NOT DETECTED NOT DETECTED   Rhinovirus / Enterovirus NOT DETECTED NOT DETECTED   Influenza A NOT DETECTED NOT DETECTED   Influenza B NOT DETECTED NOT DETECTED   Parainfluenza Virus 1 NOT DETECTED NOT DETECTED   Parainfluenza Virus 2 NOT DETECTED NOT  DETECTED   Parainfluenza Virus 3 NOT DETECTED NOT DETECTED   Parainfluenza Virus 4 NOT DETECTED NOT DETECTED   Respiratory Syncytial Virus NOT DETECTED NOT DETECTED   Bordetella pertussis NOT DETECTED NOT DETECTED   Bordetella Parapertussis NOT DETECTED NOT DETECTED   Chlamydophila pneumoniae NOT DETECTED NOT DETECTED   Mycoplasma pneumoniae NOT DETECTED NOT DETECTED  SARS Coronavirus 2 by RT PCR (hospital order, performed in [facility] hospital lab) *cepheid single result test* Nasopharyngeal Swab   Specimen: Nasopharyngeal Swab; Nasal Swab  Result Value Ref Range   SARS Coronavirus 2 by RT PCR NEGATIVE NEGATIVE  CBC  Result Value Ref Range   WBC 20.0 (H) 4.0 - 10.5 K/uL   RBC 4.73 4.22 - 5.81 MIL/uL   Hemoglobin 13.5 13.0 - 17.0 g/dL   HCT 09.8 11.9 - 14.7 %   MCV 88.6 80.0 - 100.0 fL   MCH 28.5 26.0 - 34.0 pg   MCHC 32.2 30.0 - 36.0 g/dL   RDW 82.9 56.2 - 13.0 %   Platelets 182 150 - 400 K/uL   nRBC 0.0 0.0 - 0.2 %  Basic metabolic panel  Result Value Ref Range   Sodium 135 135 - 145 mmol/L   Potassium 4.4 3.5 - 5.1 mmol/L   Chloride 102 98 - 111 mmol/L   CO2 22 22 - 32 mmol/L   Glucose, Bld 114 (H) 70 - 99 mg/dL   BUN 15 8 - 23 mg/dL   Creatinine, Ser 8.65 (H) 0.61 - 1.24 mg/dL   Calcium 9.2 8.9 - 78.4 mg/dL   GFR, Estimated 47 (L) >60 mL/min   Anion gap 11 5 - 15  D-dimer, quantitative  Result Value Ref Range   D-Dimer, Quant >20.00 (H) 0.00 - 0.50 ug/mL-FEU  Lipase, blood  Result Value Ref Range   Lipase 22 11 - 51 U/L  Hepatic function panel  Result Value Ref Range   Total Protein 7.8 6.5 - 8.1 g/dL   Albumin 3.9 3.5 - 5.0 g/dL   AST  53 (H) 15 - 41 U/L   ALT 27 0 - 44 U/L   Alkaline Phosphatase 84 38 - 126 U/L   Total Bilirubin 1.0 0.3 - 1.2 mg/dL   Bilirubin, Direct 0.1 0.0 - 0.2 mg/dL   Indirect Bilirubin 0.9 0.3 - 0.9 mg/dL  Lactic acid, plasma  Result Value Ref Range   Lactic Acid, Venous 4.4 (HH) 0.5 - 1.9 mmol/L  Lactic acid, plasma  Result Value  Ref Range   Lactic Acid, Venous 5.8 (HH) 0.5 - 1.9 mmol/L  Procalcitonin  Result Value Ref Range   Procalcitonin 0.65 ng/mL  Blood gas, arterial  Result Value Ref Range   Delivery systems ROOM AIR    pH, Arterial 7.42 7.35 - 7.45   pCO2 arterial 35 32 - 48 mmHg   pO2, Arterial 71 (L) 83 - 108 mmHg   Bicarbonate 22.7 20.0 - 28.0 mmol/L   Acid-base deficit 1.4 0.0 - 2.0 mmol/L   O2 Saturation 95.7 %   Patient temperature 37.0    Collection site LEFT RADIAL    Allens test (pass/fail) PASS PASS  Lactate dehydrogenase  Result Value Ref Range   LDH 431 (H) 98 - 192 U/L  Brain natriuretic peptide  Result Value Ref Range   B Natriuretic Peptide 28.2 0.0 - 100.0 pg/mL  Urinalysis, w/ Reflex to Culture (Infection Suspected) -Urine, Clean Catch  Result Value Ref Range   Specimen Source URINE, CLEAN CATCH    Color, Urine YELLOW (A) YELLOW   APPearance HAZY (A) CLEAR   Specific Gravity, Urine 1.023 1.005 - 1.030   pH 5.0 5.0 - 8.0   Glucose, UA NEGATIVE NEGATIVE mg/dL   Hgb urine dipstick MODERATE (A) NEGATIVE   Bilirubin Urine NEGATIVE NEGATIVE   Ketones, ur NEGATIVE NEGATIVE mg/dL   Protein, ur NEGATIVE NEGATIVE mg/dL   Nitrite NEGATIVE NEGATIVE   Leukocytes,Ua NEGATIVE NEGATIVE   RBC / HPF 0-5 0 - 5 RBC/hpf   WBC, UA 0-5 0 - 5 WBC/hpf   Bacteria, UA NONE SEEN NONE SEEN   Squamous Epithelial / HPF NONE SEEN 0 - 5 /HPF   Mucus PRESENT   TSH  Result Value Ref Range   TSH 0.685 0.350 - 4.500 uIU/mL  Urine Drug Screen, Qualitative ([facility] only)  Result Value Ref Range   Tricyclic, Ur Screen NONE DETECTED NONE DETECTED   Amphetamines, Ur Screen NONE DETECTED NONE DETECTED   MDMA (Ecstasy)Ur Screen NONE DETECTED NONE DETECTED   Cocaine Metabolite,Ur [place] NONE DETECTED NONE DETECTED   Opiate, Ur Screen NONE DETECTED NONE DETECTED   Phencyclidine (PCP) Ur S NONE DETECTED NONE DETECTED   Cannabinoid 50 Ng, Ur [place] NONE DETECTED NONE DETECTED   Barbiturates, Ur Screen NONE DETECTED NONE  DETECTED   Benzodiazepine, Ur Scrn NONE DETECTED NONE DETECTED   Methadone Scn, Ur NONE DETECTED NONE DETECTED  T4, free  Result Value Ref Range   Free T4 0.49 (L) 0.61 - 1.12 ng/dL  CBC with Differential/Platelet  Result Value Ref Range   WBC 21.4 (H) 4.0 - 10.5 K/uL   RBC 4.08 (L) 4.22 - 5.81 MIL/uL   Hemoglobin 11.7 (L) 13.0 - 17.0 g/dL   HCT 82.9 (L) 56.2 - 13.0 %   MCV 86.8 80.0 - 100.0 fL   MCH 28.7 26.0 - 34.0 pg   MCHC 33.1 30.0 - 36.0 g/dL   RDW 86.5 78.4 - 69.6 %   Platelets 150 150 - 400 K/uL   nRBC 0.0 0.0 - 0.2 %  Neutrophils Relative % 92 %   Neutro Abs 19.7 (H) 1.7 - 7.7 K/uL   Lymphocytes Relative 5 %   Lymphs Abs 1.0 0.7 - 4.0 K/uL   Monocytes Relative 2 %   Monocytes Absolute 0.4 0.1 - 1.0 K/uL   Eosinophils Relative 0 %   Eosinophils Absolute 0.0 0.0 - 0.5 K/uL   Basophils Relative 0 %   Basophils Absolute 0.0 0.0 - 0.1 K/uL   Immature Granulocytes 1 %   Abs Immature Granulocytes 0.29 (H) 0.00 - 0.07 K/uL  Comprehensive metabolic panel  Result Value Ref Range   Sodium 137 135 - 145 mmol/L   Potassium 4.9 3.5 - 5.1 mmol/L   Chloride 105 98 - 111 mmol/L   CO2 22 22 - 32 mmol/L   Glucose, Bld 154 (H) 70 - 99 mg/dL   BUN 21 8 - 23 mg/dL   Creatinine, Ser 1.61 (H) 0.61 - 1.24 mg/dL   Calcium 8.8 (L) 8.9 - 10.3 mg/dL   Total Protein 7.1 6.5 - 8.1 g/dL   Albumin 3.5 3.5 - 5.0 g/dL   AST 67 (H) 15 - 41 U/L   ALT 26 0 - 44 U/L   Alkaline Phosphatase 68 38 - 126 U/L   Total Bilirubin 1.2 0.3 - 1.2 mg/dL   GFR, Estimated 49 (L) >60 mL/min   Anion gap 10 5 - 15  Magnesium  Result Value Ref Range   Magnesium 2.1 1.7 - 2.4 mg/dL  Phosphorus  Result Value Ref Range   Phosphorus 4.1 2.5 - 4.6 mg/dL  RPR  Result Value Ref Range   RPR Ser Ql Reactive (A) NON REACTIVE   RPR Titer 1:2   Helper T-Lymph-CD4 ([facility] only)  Result Value Ref Range   Absolute CD 4 Helper 385 359 - 1,519 /uL   % CD 4 Pos. Lymph. 35.0 30.8 - 58.5 %   WBC 20.9 (H) 3.4 - 10.8  x10E3/uL   RBC 4.01 (L) 4.14 - 5.80 x10E6/uL   Hematocrit 35.1 (L) 37.5 - 51.0 %   MCV 88 79 - 97 fL   MCH 28.9 26.6 - 33.0 pg   MCHC 33.0 31.5 - 35.7 g/dL   RDW 09.6 04.5 - 40.9 %   Platelets 172 150 - 450 x10E3/uL   Neutrophils 91 Not Estab. %   Lymphs 5 Not Estab. %   Monocytes 2 Not Estab. %   Eos 0 Not Estab. %   Basos 0 Not Estab. %   Neutrophils Absolute 19.0 (H) 1.4 - 7.0 x10E3/uL   Lymphocytes Absolute 1.1 0.7 - 3.1 x10E3/uL   Monocytes Absolute 0.4 0.1 - 0.9 x10E3/uL   EOS (ABSOLUTE) 0.0 0.0 - 0.4 x10E3/uL   Basophils Absolute 0.0 0.0 - 0.2 x10E3/uL   Immature Granulocytes 2 Not Estab. %   Immature Grans (Abs) 0.4 (H) 0.0 - 0.1 x10E3/uL   Hemoglobin 11.6 (L) 13.0 - 17.7 g/dL  Lactic acid, plasma  Result Value Ref Range   Lactic Acid, Venous 3.7 (HH) 0.5 - 1.9 mmol/L  Lactic acid, plasma  Result Value Ref Range   Lactic Acid, Venous 2.5 (HH) 0.5 - 1.9 mmol/L  Ammonia  Result Value Ref Range   Ammonia 19 9 - 35 umol/L  Blood gas, arterial  Result Value Ref Range   O2 Content 4.0 L/min   Delivery systems NO CHARGE    pH, Arterial 7.49 (H) 7.35 - 7.45   pCO2 arterial 37 32 - 48 mmHg   pO2, Arterial 143 (  H) 83 - 108 mmHg   Bicarbonate 28.2 (H) 20.0 - 28.0 mmol/L   Acid-Base Excess 4.7 (H) 0.0 - 2.0 mmol/L   O2 Saturation 100 %   Patient temperature 37.0    Allens test (pass/fail) PASS PASS  HIV-1 RNA quant-no reflex-bld  Result Value Ref Range   HIV 1 RNA Quant <20 copies/mL   LOG10 HIV-1 RNA UNABLE TO CALCULATE log10copy/mL  T.pallidum Ab, Total  Result Value Ref Range   T Pallidum Abs Reactive (A) Non Reactive  CBC  Result Value Ref Range   WBC 20.3 (H) 4.0 - 10.5 K/uL   RBC 3.78 (L) 4.22 - 5.81 MIL/uL   Hemoglobin 10.6 (L) 13.0 - 17.0 g/dL   HCT 29.5 (L) 62.1 - 30.8 %   MCV 86.0 80.0 - 100.0 fL   MCH 28.0 26.0 - 34.0 pg   MCHC 32.6 30.0 - 36.0 g/dL   RDW 65.7 84.6 - 96.2 %   Platelets 149 (L) 150 - 400 K/uL   nRBC 0.0 0.0 - 0.2 %  Basic metabolic  panel  Result Value Ref Range   Sodium 139 135 - 145 mmol/L   Potassium 4.0 3.5 - 5.1 mmol/L   Chloride 105 98 - 111 mmol/L   CO2 28 22 - 32 mmol/L   Glucose, Bld 133 (H) 70 - 99 mg/dL   BUN 29 (H) 8 - 23 mg/dL   Creatinine, Ser 9.52 0.61 - 1.24 mg/dL   Calcium 8.6 (L) 8.9 - 10.3 mg/dL   GFR, Estimated >84 >13 mL/min   Anion gap 6 5 - 15  Procalcitonin  Result Value Ref Range   Procalcitonin 0.24 ng/mL  GenoSure Prime  Result Value Ref Range   HIV GenoSure PRIme(SM) INQHIV    HIV GenoSure PRIme(SM) Comment   Glucose, capillary  Result Value Ref Range   Glucose-Capillary 122 (H) 70 - 99 mg/dL  Glucose, capillary  Result Value Ref Range   Glucose-Capillary 130 (H) 70 - 99 mg/dL  Glucose, capillary  Result Value Ref Range   Glucose-Capillary 114 (H) 70 - 99 mg/dL  CBG monitoring, ED  Result Value Ref Range   Glucose-Capillary 112 (H) 70 - 99 mg/dL  Type and screen  Result Value Ref Range   ABO/RH(D) A POS    Antibody Screen NEG    Sample Expiration      08/22/2022,2359 Performed at [facility], [address] [place], [place] [address]   Troponin I (High Sensitivity)  Result Value Ref Range   Troponin I (High Sensitivity) 15 <18 ng/L    Assessment & Plan:   Problem List Items Addressed This Visit   None Visit Diagnoses     Right shoulder pain, unspecified chronicity    -  Primary   Relevant Orders   DG Shoulder Right        No orders of the defined types were placed in this encounter.   Orders Placed This Encounter  Procedures   DG Shoulder Right    Standing Status:   Future    Number of Occurrences:   1    Standing Expiration Date:   09/04/2023    Order Specific Question:   Reason for Exam (SYMPTOM  OR DIAGNOSIS REQUIRED)    Answer:   R shoulder pain    Order Specific Question:   Preferred imaging location?    Answer:   [facility]    Patient Instructions  Hearing screen today.  Memory testing today was somewhat abnormal -  raising  question of some memory difficulty.  Work on below 4 core lifestyle interventions to support a healthy mind:  1. Nutritious well balance diet.  2. Regular physical activity routine.  3. Regular mental activity such as reading books, word puzzles, math puzzles, jigsaw puzzles.  4. Social engagement.  Also ensure good blood pressure control, limit alcohol, no smoking.    Keep appointment with infectious disease doctor for June 3rd Return after July 11th for wellness visit/physical  Follow up plan: Return in about 10 weeks (around 11/13/2022) for annual exam, prior fasting for blood work, [facility] wellness visit.  [name], MD

## 2022-09-04 NOTE — Patient Instructions (Addendum)
Hearing screen today.  Memory testing today was somewhat abnormal - raising question of some memory difficulty.  Work on below 4 core lifestyle interventions to support a healthy mind:  1. Nutritious well balance diet.  2. Regular physical activity routine.  3. Regular mental activity such as reading books, word puzzles, math puzzles, jigsaw puzzles.  4. Social engagement.  Also ensure good blood pressure control, limit alcohol, no smoking.    Keep appointment with infectious disease doctor for June 3rd Return after July 11th for wellness visit/physical

## 2022-09-05 ENCOUNTER — Encounter: Payer: Self-pay | Admitting: Family Medicine

## 2022-09-05 ENCOUNTER — Telehealth: Payer: Self-pay | Admitting: Family Medicine

## 2022-09-05 DIAGNOSIS — M25511 Pain in right shoulder: Secondary | ICD-10-CM | POA: Insufficient documentation

## 2022-09-05 DIAGNOSIS — R413 Other amnesia: Secondary | ICD-10-CM | POA: Insufficient documentation

## 2022-09-05 MED ORDER — LISINOPRIL-HYDROCHLOROTHIAZIDE 20-12.5 MG PO TABS: 2.0000 | ORAL_TABLET | Freq: Every day | ORAL | 3 refills | Status: DC

## 2022-09-05 MED ORDER — ENSURE ENLIVE PO LIQD: 237.0000 mL | ORAL | Status: DC

## 2022-09-05 MED ORDER — ASPIRIN 81 MG PO TBEC: 81.0000 mg | DELAYED_RELEASE_TABLET | Freq: Every day | ORAL | Status: DC

## 2022-09-05 MED ORDER — DOLUTEGRAVIR-LAMIVUDINE 50-300 MG PO TABS: 1.0000 | ORAL_TABLET | Freq: Every day | ORAL | 0 refills | Status: DC

## 2022-09-05 MED ORDER — FLUTICASONE-SALMETEROL 250-50 MCG/ACT IN AEPB: 1.0000 | INHALATION_SPRAY | Freq: Two times a day (BID) | RESPIRATORY_TRACT | 6 refills | Status: DC

## 2022-09-05 MED ORDER — AMLODIPINE BESYLATE 10 MG PO TABS: 10.0000 mg | ORAL_TABLET | Freq: Every day | ORAL | 3 refills | Status: DC

## 2022-09-05 NOTE — Telephone Encounter (Signed)
Plz call patient to review below recommendations from recent OV: In h/o previous strokes, recommend restart aspirin 81mg  daily OTC I've sent in Dovato antiviral (HIV) medicine (speak only with pt about this) to take 1 tab daily until he gets in to see ID (will run out of samples given at hospital prior to his ID appt). If he cannot afford med, he will need to contact ID clinic to get more samples prior to his appointment.  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 Rec schedule f/u with Dr Patsy Lager sports medicine. For COPD - recommend continue to stay off cigarettes, start daily controller medicine Advair sent to pharmacy. Let us know if unaffordable (may be $75/mo). If he'd like I can have one of our pharmacists call him to go over medication assistance programs.  Hearing screen returned ok. Memory difficulties may come from h/o strokes - rec restart aspirin as per above. Consider restart atorvastatin chol med - does he know why this was stopped? Consider trial of a memory medicine called aricept - we can further discuss at appt in 11/2022.

## 2022-09-05 NOTE — Assessment & Plan Note (Addendum)
Son in law brings up this concern.  Significant agitation during hospitalization that did improve with stabilization of respiratory status.  MMSE, CDT today raise concern for cognitive impairment and possible dementia.  Recent brain MRI largely stable.  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 Restart aspirin, consider restarting statin. Consider donepezil > namenda trial.  Reviewed 4 core lifestyle interventions to support healthy mind as per instructions.

## 2022-09-05 NOTE — Assessment & Plan Note (Signed)
Recent hospitalization for hypoxic resp failure in setting of COPD exacerbation which may have been triggered after mowing lawn.  He has completed prednisone/antibiotic course and seems stable from respiratory status.  Continue albuterol inhaler PRN. Has previously not been regular with daily controller medication.  Consider spirometry vs pulm eval.

## 2022-09-05 NOTE — Assessment & Plan Note (Addendum)
H/o recurrent CVAs 2003, TIA 2019.  Last saw neurology Malvin Johns) 2022.  Was previously on aspirin and plavix.  Atorvastatin was discontinued during hospitalization, unclear why. Consider restarting in h/o CVA. Will recommend restart at least aspirin 81mg  daily.

## 2022-09-05 NOTE — Assessment & Plan Note (Addendum)
S/p brachytherapy 2014.  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 Treated with Firmagon injections 03/2022.  Due for uro f/u. Will encourage he call and schedule f/u

## 2022-09-05 NOTE — Assessment & Plan Note (Signed)
Anticipate RTC injury but also has evidence of osteoarthritis vs GH joint injury.  Check R shoulder xray today, and pending results will rec f/u with sports medicine for further eval/management.  H/o remote steroid injection into shoulder with benefit.

## 2022-09-05 NOTE — Assessment & Plan Note (Signed)
Difficulty hearing noted - however hearing screen overall stable.  If worsening, to let me know for formal audiology eval.

## 2022-09-05 NOTE — Assessment & Plan Note (Signed)
BP stable on current regimen of lisinopril hctz 20/12.5mg  2 tab daily and amlodipine 10mg  daily.

## 2022-09-05 NOTE — Assessment & Plan Note (Addendum)
Followed by ID, longterm on Genvoya.  During hospitalization Genvoya was changed to Dovato (dolutegravir-lamivudine) with planned f/u with ID Dr Thedore Mins 10/05/2022. He was given 30d sample of Dovato on discharge 08/22/2022 - note he will run out of this prior to upcoming appt. Will Rx 30d supply to last until ID f/u.   Of note, pt's family unaware of HIV status per pt request.

## 2022-09-05 NOTE — Assessment & Plan Note (Signed)
Recent COPD exac with hospitalization. Will recommend restart controller inhaler - advair sent to pharmacy.

## 2022-09-07 NOTE — Telephone Encounter (Signed)
Attempted to contact pt. No answer. No vm. Need to relay Dr. Timoteo Expose message to pt ONLY. Also, schedule OV with Dr. Patsy Lager for R shoulder pain.   I'm also mailing info to pt.

## 2022-09-08 NOTE — Telephone Encounter (Addendum)
Attempted to contact pt. No answer. No vm. Need to relay Dr. Timoteo Expose message to pt ONLY. Also, schedule OV with Dr. Patsy Lager for R shoulder pain.   Also, mailed info to pt yesterday.

## 2022-09-09 NOTE — Telephone Encounter (Signed)
Attempted to contact pt. No answer. No vm. Need to relay Dr. Timoteo Expose message to pt ONLY. Also, schedule OV with Dr. Patsy Lager for R shoulder pain.   Also, mailed info to pt on 09/07/22.   Closing phn note due to several attempts to contact pt with no response.

## 2022-09-10 NOTE — Telephone Encounter (Signed)
Spoke with pt relaying Dr. Timoteo Expose message. Pt verbalizes understanding. Also, notified pt I mailed all of the info to him in a letter. Pt expresses his thanks and states he's currently in [place] and won't return to [place] until 09/16/22. I asked if there's anyone that opens his mail. He confirms no one does but I still emphasized the importance of him being the only person to open the letter due to the sensitive info. Pt verbalizes understanding.

## 2022-09-10 NOTE — Telephone Encounter (Signed)
Just seeing this message - there was information in my message that was private and should only go to patient. Need to verify pt and only pt received/opened the letter. Please continue reaching out to patient, would see if another family member can contact pt to have him call our office to review this.

## 2022-10-05 ENCOUNTER — Ambulatory Visit: Payer: Medicare Other | Admitting: Internal Medicine

## 2022-10-13 DIAGNOSIS — J441 Chronic obstructive pulmonary disease with (acute) exacerbation: Secondary | ICD-10-CM

## 2022-10-13 DIAGNOSIS — Z9181 History of falling: Secondary | ICD-10-CM

## 2022-10-13 DIAGNOSIS — E872 Acidosis, unspecified: Secondary | ICD-10-CM

## 2022-10-13 DIAGNOSIS — Z85038 Personal history of other malignant neoplasm of large intestine: Secondary | ICD-10-CM

## 2022-10-13 DIAGNOSIS — Z8673 Personal history of transient ischemic attack (TIA), and cerebral infarction without residual deficits: Secondary | ICD-10-CM

## 2022-10-13 DIAGNOSIS — N179 Acute kidney failure, unspecified: Secondary | ICD-10-CM

## 2022-10-13 DIAGNOSIS — A419 Sepsis, unspecified organism: Secondary | ICD-10-CM

## 2022-10-13 DIAGNOSIS — A539 Syphilis, unspecified: Secondary | ICD-10-CM

## 2022-10-13 DIAGNOSIS — B20 Human immunodeficiency virus [HIV] disease: Secondary | ICD-10-CM

## 2022-10-13 DIAGNOSIS — J40 Bronchitis, not specified as acute or chronic: Secondary | ICD-10-CM

## 2022-10-13 DIAGNOSIS — B1910 Unspecified viral hepatitis B without hepatic coma: Secondary | ICD-10-CM

## 2022-10-13 DIAGNOSIS — J189 Pneumonia, unspecified organism: Secondary | ICD-10-CM

## 2022-10-13 DIAGNOSIS — J440 Chronic obstructive pulmonary disease with acute lower respiratory infection: Secondary | ICD-10-CM

## 2022-10-13 DIAGNOSIS — Z87891 Personal history of nicotine dependence: Secondary | ICD-10-CM

## 2022-10-13 DIAGNOSIS — K449 Diaphragmatic hernia without obstruction or gangrene: Secondary | ICD-10-CM

## 2022-10-13 DIAGNOSIS — K5790 Diverticulosis of intestine, part unspecified, without perforation or abscess without bleeding: Secondary | ICD-10-CM

## 2022-10-13 DIAGNOSIS — Z8546 Personal history of malignant neoplasm of prostate: Secondary | ICD-10-CM

## 2022-10-13 DIAGNOSIS — Z556 Problems related to health literacy: Secondary | ICD-10-CM

## 2022-10-13 DIAGNOSIS — Z8615 Personal history of latent tuberculosis infection: Secondary | ICD-10-CM

## 2022-10-13 DIAGNOSIS — N183 Chronic kidney disease, stage 3 unspecified: Secondary | ICD-10-CM

## 2022-10-13 DIAGNOSIS — M439 Deforming dorsopathy, unspecified: Secondary | ICD-10-CM

## 2022-10-13 DIAGNOSIS — I129 Hypertensive chronic kidney disease with stage 1 through stage 4 chronic kidney disease, or unspecified chronic kidney disease: Secondary | ICD-10-CM

## 2022-10-13 DIAGNOSIS — J9621 Acute and chronic respiratory failure with hypoxia: Secondary | ICD-10-CM

## 2022-10-13 DIAGNOSIS — J439 Emphysema, unspecified: Secondary | ICD-10-CM

## 2022-10-13 DIAGNOSIS — I700 Atherosclerosis of aorta: Secondary | ICD-10-CM

## 2022-10-13 DIAGNOSIS — F32A Depression, unspecified: Secondary | ICD-10-CM

## 2022-10-13 DIAGNOSIS — G9341 Metabolic encephalopathy: Secondary | ICD-10-CM

## 2022-10-13 DIAGNOSIS — R6520 Severe sepsis without septic shock: Secondary | ICD-10-CM

## 2022-10-19 ENCOUNTER — Encounter: Payer: Self-pay | Admitting: Family Medicine

## 2022-10-19 ENCOUNTER — Ambulatory Visit (INDEPENDENT_AMBULATORY_CARE_PROVIDER_SITE_OTHER): Payer: Medicare Other | Admitting: Family Medicine

## 2022-10-19 VITALS — BP 138/82 | HR 102 | Temp 97.3°F | Ht 65.0 in | Wt 128.1 lb

## 2022-10-19 DIAGNOSIS — J439 Emphysema, unspecified: Secondary | ICD-10-CM

## 2022-10-19 DIAGNOSIS — G8929 Other chronic pain: Secondary | ICD-10-CM

## 2022-10-19 DIAGNOSIS — B20 Human immunodeficiency virus [HIV] disease: Secondary | ICD-10-CM

## 2022-10-19 DIAGNOSIS — I10 Essential (primary) hypertension: Secondary | ICD-10-CM

## 2022-10-19 DIAGNOSIS — J441 Chronic obstructive pulmonary disease with (acute) exacerbation: Secondary | ICD-10-CM

## 2022-10-19 DIAGNOSIS — Z8673 Personal history of transient ischemic attack (TIA), and cerebral infarction without residual deficits: Secondary | ICD-10-CM

## 2022-10-19 DIAGNOSIS — M25511 Pain in right shoulder: Secondary | ICD-10-CM

## 2022-10-19 MED ORDER — DEXAMETHASONE SODIUM PHOSPHATE 10 MG/ML IJ SOLN
10.0000 mg | Freq: Once | INTRAMUSCULAR | Status: DC
Start: 2022-10-19 — End: 2022-10-19

## 2022-10-19 MED ORDER — DEXAMETHASONE SODIUM PHOSPHATE 10 MG/ML IJ SOLN
10.0000 mg | Freq: Once | INTRAMUSCULAR | Status: AC
Start: 2022-10-19 — End: 2022-10-19
  Administered 2022-10-19: 10 mg via INTRAMUSCULAR

## 2022-10-19 MED ORDER — IPRATROPIUM-ALBUTEROL 0.5-2.5 (3) MG/3ML IN SOLN
3.0000 mL | Freq: Once | RESPIRATORY_TRACT | Status: AC
Start: 2022-10-19 — End: 2022-10-19
  Administered 2022-10-19: 3 mL via RESPIRATORY_TRACT

## 2022-10-19 MED ORDER — ALBUTEROL SULFATE HFA 108 (90 BASE) MCG/ACT IN AERS
2.0000 | INHALATION_SPRAY | Freq: Four times a day (QID) | RESPIRATORY_TRACT | 2 refills | Status: DC | PRN
Start: 2022-10-19 — End: 2023-02-10

## 2022-10-19 NOTE — Progress Notes (Signed)
Ph: [phone] Fax: [phone]   Patient ID: Charles Daniel, male    DOB: 01-12-1941, 82 y.o.   MRN: [id number]  This visit was conducted in person.  BP 138/82   Pulse (!) 102   Temp (!) 97.3 F (36.3 C) (Temporal)   Ht 5\' 5"  (1.651 m)   Wt 128 lb 2 oz (58.1 kg)   SpO2 95%   BMI 21.32 kg/m    CC: R shoulder pain, wheezing  Subjective:   HPI: [name] is a 82 y.o. male presenting on 10/19/2022 for Shoulder Pain (C/o R shoulder pain radiating into L shoulder. Started about 1 mo ago. Denies injury. Tried heat therapy, not helpful. H/o shoulder pain- received inj. ) and Wheezing (C/o wheezing- worse at night. Started about 2 wks ago. Pt states he was seen at ER while visiting family in [place] and was given a pill and and inhaler when he has finished both. Pt requests refill of albuterol inh. 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 Hasn't tried anything for this yet. Using heating pad at night time. Denies inciting trauma/injury or fall. No fevers/chills.  DG Shoulder Right CLINICAL DATA:  R shoulder pain  EXAM: RIGHT SHOULDER - 2+ VIEW  COMPARISON:  August 19, 2022  FINDINGS: No acute fracture or dislocation. 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 Mild degenerative changes of the glenohumeral joint. No area of erosion or osseous destruction. No unexpected radiopaque foreign body. Soft tissues are unremarkable.  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  Electronically Signed   By: Meda Klinefelter M.D.   On: 09/09/2022 12:00    2-3 wks of wheezing, worse at night. Chest congestion, cough productive of mucous, some dyspnea.  He's not been taking advair or aspirin. Ran out of albuterol.   No known h/o glaucoma but he has not seen eye doctor recently.      Relevant past medical, surgical, family and social  history reviewed and updated as indicated. Interim medical history since our last visit reviewed. Allergies and medications reviewed and updated. Outpatient Medications Prior to Visit  Medication Sig Dispense Refill   amLODipine (NORVASC) 10 MG tablet Take 1 tablet (10 mg total) by mouth daily. 90 tablet 3   aspirin EC 81 MG tablet Take 1 tablet (81 mg total) by mouth daily. Swallow whole.     Cholecalciferol (VITAMIN D3) 25 MCG (1000 UT) CAPS Take 1 capsule (1,000 Units total) by mouth daily. 30 capsule    dolutegravir-lamiVUDine (DOVATO) 50-300 MG tablet Take 1 tablet by mouth daily. 30 tablet 0   feeding supplement (ENSURE ENLIVE / ENSURE PLUS) LIQD Take 237 mLs by mouth daily.     fluticasone-salmeterol (ADVAIR) 250-50 MCG/ACT AEPB Inhale 1 puff into the lungs in the morning and at bedtime. 60 each 6   lisinopril-hydrochlorothiazide (ZESTORETIC) 20-12.5 MG tablet Take 2 tablets by mouth daily. 180 tablet 3   albuterol (VENTOLIN HFA) 108 (90 Base) MCG/ACT inhaler Inhale 2 puffs into the lungs every 6 (six) hours as needed for wheezing or shortness of breath. 8 g 2   No facility-administered medications prior to visit.     Per HPI unless specifically indicated in ROS section below Review of Systems  Objective:  BP 138/82   Pulse (!) 102   Temp (!) 97.3 F (36.3 C) (Temporal)   Ht 5\' 5"  (1.651 m)  Wt 128 lb 2 oz (58.1 kg)   SpO2 95%   BMI 21.32 kg/m   Wt Readings from Last 3 Encounters:  10/19/22 128 lb 2 oz (58.1 kg)  09/04/22 130 lb 8 oz (59.2 kg)  08/22/22 133 lb 11.2 oz (60.6 kg)      Physical Exam Vitals and nursing note reviewed.  Constitutional:      Appearance: Normal appearance. He is not ill-appearing.  Cardiovascular:     Rate and Rhythm: Normal rate and regular rhythm.     Pulses: Normal pulses.     Heart sounds: Normal heart sounds. No murmur heard. Pulmonary:     Effort: Pulmonary effort is normal. No respiratory distress.     Breath sounds: Wheezing  (coares throughout) and rhonchi present. No rales.     Comments: Improved air movement after albuterol/atrovent nebulization, wheezing persists Musculoskeletal:     Right lower leg: No edema.     Left lower leg: No edema.     Comments:  L shoulder WNL R shoulder exam: No deformity of shoulders on inspection. Tender to palpation at [facility] joint.  Limited ROM in abduction and forward flexion past 90 degrees due to pain. No pain or weakness with testing SITS in ext/int rotation. No pain with empty can sign. Neg Speed test. No impingement. Discomfort with crossover test. No pain with rotation of humeral head in [facility] joint.   Skin:    General: Skin is warm and dry.     Findings: No rash.  Neurological:     Mental Status: He is alert.  Psychiatric:        Mood and Affect: Mood normal.        Behavior: Behavior normal.        Assessment & Plan:   Problem List Items Addressed This Visit     HIV infection (HCC)    He states he continues Dovato and has bottle with 60 pills left.  Missed recent ID appt.  # provided for him to call and schedule ID follow up visit.       HTN (hypertension), malignant    BP stable on current regimen of amlodipine, lisinopril, hydrochlorothiazide.       History of stroke without residual deficits    Discussed restarting aspirin 81mg  daily.  H/o recurrent CVA 2003, 2019.  Will also need to restart statin - discuss at next visit.       COPD exacerbation (HCC)    Anticipate COPD exacerbation given increased cough, increased sputum production, increased shortness of breath. He's run out of all inhalers.  Treat with duoneb in office (alb/atrovent nebulization) and decadron 10mg  IM x1. Discussed restarting albuterol and advair inhalers. See below. Update if not improved with this.       Relevant Medications   albuterol (VENTOLIN HFA) 108 (90 Base) MCG/ACT inhaler   COPD with emphysema (HCC) - Primary   Relevant Medications   albuterol (VENTOLIN HFA)  108 (90 Base) MCG/ACT inhaler   Right shoulder pain    Pain present for 2-3 months.  Exam today more suspicious for DJD of AC joint.  Treat with decadron IM today for this and COPD exacerbation as per above, follow up with sports medicine if ongoing R shoulder pain.         Meds ordered this encounter  Medications   albuterol (VENTOLIN HFA) 108 (90 Base) MCG/ACT inhaler    Sig: Inhale 2 puffs into the lungs every 6 (six) hours as needed for wheezing or  shortness of breath. This is rescue inhaler    Dispense:  8 g    Refill:  2   ipratropium-albuterol (DUONEB) 0.5-2.5 (3) MG/3ML nebulizer solution 3 mL   DISCONTD: dexamethasone (DECADRON) injection 10 mg   dexamethasone (DECADRON) injection 10 mg    No orders of the defined types were placed in this encounter.   Patient Instructions  Start aspirin 81mg  daily over the counter.  Restart advair 1 puff twice daily controller inhaler to control COPD breathing. May refill albuterol rescue inhaler 2 puffs every 6 hours as needed for shortness of breath.  For shoulder and breathing, steroid shot today. Breathing treatment today. Let us know if breathing not improving with this.  If ongoing shoulder pain, return to see Dr Patsy Lager sports medicine  Call ID clinic to schedule follow up visit as you missed this month's appointment [phone]   Follow up plan: Return if symptoms worsen or fail to improve.  [name], MD

## 2022-10-19 NOTE — Assessment & Plan Note (Signed)
He states he continues Dovato and has bottle with 60 pills left.  Missed recent ID appt.  # provided for him to call and schedule ID follow up visit.

## 2022-10-19 NOTE — Patient Instructions (Addendum)
Start aspirin 81mg  daily over the counter.  Restart advair 1 puff twice daily controller inhaler to control COPD breathing. May refill albuterol rescue inhaler 2 puffs every 6 hours as needed for shortness of breath.  For shoulder and breathing, steroid shot today. Breathing treatment today. Let us know if breathing not improving with this.  If ongoing shoulder pain, return to see Dr Patsy Lager sports medicine  Call ID clinic to schedule follow up visit as you missed this month's appointment [phone]

## 2022-10-19 NOTE — Assessment & Plan Note (Signed)
Anticipate COPD exacerbation given increased cough, increased sputum production, increased shortness of breath. He's run out of all inhalers.  Treat with duoneb in office (alb/atrovent nebulization) and decadron 10mg  IM x1. Discussed restarting albuterol and advair inhalers. See below. Update if not improved with this.

## 2022-10-19 NOTE — Assessment & Plan Note (Signed)
Pain present for 2-3 months.  Exam today more suspicious for DJD of AC joint.  Treat with decadron IM today for this and COPD exacerbation as per above, follow up with sports medicine if ongoing R shoulder pain.

## 2022-10-19 NOTE — Assessment & Plan Note (Signed)
BP stable on current regimen of amlodipine, lisinopril, hydrochlorothiazide.

## 2022-10-19 NOTE — Assessment & Plan Note (Signed)
Discussed restarting aspirin 81mg  daily.  H/o recurrent CVA 2003, 2019.  Will also need to restart statin - discuss at next visit.

## 2022-11-08 ENCOUNTER — Other Ambulatory Visit: Payer: Self-pay | Admitting: Family Medicine

## 2022-11-08 DIAGNOSIS — C61 Malignant neoplasm of prostate: Secondary | ICD-10-CM

## 2022-11-08 DIAGNOSIS — R7303 Prediabetes: Secondary | ICD-10-CM

## 2022-11-08 DIAGNOSIS — E786 Lipoprotein deficiency: Secondary | ICD-10-CM

## 2022-11-08 DIAGNOSIS — N1831 Chronic kidney disease, stage 3a: Secondary | ICD-10-CM

## 2022-11-12 ENCOUNTER — Other Ambulatory Visit: Payer: Medicare Other

## 2022-11-20 ENCOUNTER — Encounter: Payer: Medicare Other | Admitting: Family Medicine

## 2022-12-23 ENCOUNTER — Other Ambulatory Visit (HOSPITAL_COMMUNITY): Payer: Self-pay

## 2022-12-23 ENCOUNTER — Telehealth: Payer: Self-pay | Admitting: Family Medicine

## 2022-12-23 NOTE — Telephone Encounter (Signed)
Attending provider Vinnie Langton from [facility] call requesting a call back regarding pt Medications the patient is in the hospital #[phone]

## 2022-12-23 NOTE — Telephone Encounter (Signed)
Spoke with Joie Bimler, attending provider at [facility] caring for pt.  She was reaching out because Dr. Reece Agar had sent in a refill for pts Dovato in may.  She wanted to see if pt was getting any pt assistance due to the cost of medication.  Let Judeth Cornfield know that was a 1 month supply to hold pt over until he was able to get into ID doctor.  I do not see any evidence that we are helping with any pt assistance for this medication.

## 2022-12-25 NOTE — Telephone Encounter (Signed)
Agree with this. This was while pt got in to see ID doc who should continue prescribing Dovato.

## 2023-01-06 ENCOUNTER — Inpatient Hospital Stay: Payer: Medicare Other | Admitting: Family Medicine

## 2023-01-07 ENCOUNTER — Encounter: Payer: Self-pay | Admitting: Family Medicine

## 2023-01-11 ENCOUNTER — Ambulatory Visit (INDEPENDENT_AMBULATORY_CARE_PROVIDER_SITE_OTHER): Payer: Medicare Other | Admitting: Internal Medicine

## 2023-01-11 ENCOUNTER — Encounter: Payer: Self-pay | Admitting: Internal Medicine

## 2023-01-11 ENCOUNTER — Other Ambulatory Visit: Payer: Self-pay

## 2023-01-11 ENCOUNTER — Other Ambulatory Visit (HOSPITAL_COMMUNITY): Payer: Self-pay

## 2023-01-11 ENCOUNTER — Telehealth: Payer: Self-pay

## 2023-01-11 VITALS — BP 134/85 | HR 84 | Resp 16 | Ht 65.0 in | Wt 127.0 lb

## 2023-01-11 DIAGNOSIS — B20 Human immunodeficiency virus [HIV] disease: Secondary | ICD-10-CM

## 2023-01-11 DIAGNOSIS — Z23 Encounter for immunization: Secondary | ICD-10-CM

## 2023-01-11 MED ORDER — DOLUTEGRAVIR-LAMIVUDINE 50-300 MG PO TABS
1.0000 | ORAL_TABLET | Freq: Every day | ORAL | 0 refills | Status: DC
Start: 2023-01-11 — End: 2023-03-23

## 2023-01-11 NOTE — Progress Notes (Signed)
[facility]     HPI: Charles Daniel is a 82 y.o. male  for HIV. pT staes he has has a cva and hurt his head, hospitalized at [facility] in Aug, 2024(brought letter in). Off of ART for 1.5 as he could not afford his meds. Pt was switched from genvoya to dovato in April , 2024 to avoid taf/cobi.  Date of diagnosis:  ART exposure Past OIs Risk factors: Herterosexual   Social: Occupation: Youth worker, retired.  Housing: staying wit sister Support: Understanding of HIV: Etoh/drug/tobacco use: no/no/no  Past Medical History:  Diagnosis Date   Bilateral hydrocele 2012   Depression    Diverticulosis 2013   by colonoscopy   Emphysema lung (HCC) 03/2014    by CXR, remote smoking history   History of colon cancer    2007 --  S/P RECTOSIGMOID COLECTOMY--  NO CHEMORADIATION--  NO RECURRENCE   History of CVA (cerebrovascular accident)    2003-  RIGHT MIDDLE CVA---   NO RESIDUAL   History of hepatitis B    REMOTE AND INACTIVE  PER DOCUMENTATION   History of syphilis    SECONDARY SYPHILITIS TX'D IN 1998  PER DOCUMENTATION   History of tuberculosis    LATENT TB  TX'D X12  MONTHS IN 1995   HIV infection (HCC)    DX 1995--  MONITORED BY INFECTIOUS DISEASE (DR CAMPBELL)   Hypertension    Prostate carcinoma (HCC) dx 09/2012   T1c, brachytherapy/seed implant Vernie Ammons, Kathrynn Running)    Past Surgical History:  Procedure Laterality Date   COLONOSCOPY  08/2011   3 polyps, diverticulosis, rec rpt 5 yrs Arlyce Dice)   COLONOSCOPY  10/2016   7 TAs, diverticulosis, no rpt recommended ([name])   INGUINAL HERNIA REPAIR  1994   UNILATERAL   LOW ANTERIOR RESECTION RECTOSIGMOID COLON  03-09-2006   PROSTATE BIOPSY  09/2012   53cc  (MD OFFICE)   RADIOACTIVE SEED IMPLANT N/A 01/20/2013   Procedure: RADIOACTIVE SEED IMPLANT;  Surgeon: Garnett Farm, MD;  as well as Rose Phi MD   TRANSTHORACIC ECHOCARDIOGRAM  11-16-2001   LV WALL THICKNESS MODERATELY INCREASED/  EF 55-65%/ LVSF  NORMAL    Family History  Problem Relation Age of Onset   Cancer Sister        breast   Breast cancer Sister    Cancer Brother 92       prostate, treated with seed implant   Cancer Brother        prostate   Cancer Brother        prostate   Cancer Daughter        breast   Cancer Other        prostate   Cancer Father        unsure   CAD Neg Hx    Stroke Neg Hx    Diabetes Neg Hx    Current Outpatient Medications on File Prior to Visit  Medication Sig Dispense Refill   albuterol (VENTOLIN HFA) 108 (90 Base) MCG/ACT inhaler Inhale 2 puffs into the lungs every 6 (six) hours as needed for wheezing or shortness of breath. This is rescue inhaler 8 g 2   amLODipine (NORVASC) 10 MG tablet Take 1 tablet (10 mg total) by mouth daily. 90 tablet 3   aspirin EC 81 MG tablet Take 1 tablet (81 mg total) by mouth daily. Swallow whole.     atorvastatin (LIPITOR) 40 MG tablet Take 40 mg by  mouth daily.     Cholecalciferol (VITAMIN D3) 25 MCG (1000 UT) CAPS Take 1 capsule (1,000 Units total) by mouth daily. 30 capsule    clopidogrel (PLAVIX) 75 MG tablet Take 75 mg by mouth daily.     dolutegravir-lamiVUDine (DOVATO) 50-300 MG tablet Take 1 tablet by mouth daily. 30 tablet 0   feeding supplement (ENSURE ENLIVE / ENSURE PLUS) LIQD Take 237 mLs by mouth daily.     fluticasone-salmeterol (ADVAIR) 250-50 MCG/ACT AEPB Inhale 1 puff into the lungs in the morning and at bedtime. 60 each 6   levETIRAcetam (KEPPRA) 250 MG tablet SMARTSIG:1 Tablet(s) By Mouth Every 12 Hours     lisinopril-hydrochlorothiazide (ZESTORETIC) 20-12.5 MG tablet Take 2 tablets by mouth daily. 180 tablet 3   No current facility-administered medications on file prior to visit.    No Known Allergies    Lab Results HIV 1 RNA Quant  Date Value  08/19/2022 <20 copies/mL  03/18/2022 Not Detected Copies/mL  12/02/2020 Not Detected Copies/mL   CD4 T Cell Abs (/uL)  Date Value  03/18/2022 631  12/02/2020 975  12/05/2019 1,140    No results found for: "HIV1GENOSEQ" Lab Results  Component Value Date   WBC 20.3 (H) 08/21/2022   HGB 10.6 (L) 08/21/2022   HCT 32.5 (L) 08/21/2022   MCV 86.0 08/21/2022   PLT 149 (L) 08/21/2022    Lab Results  Component Value Date   CREATININE 1.02 08/21/2022   BUN 29 (H) 08/21/2022   NA 139 08/21/2022   K 4.0 08/21/2022   CL 105 08/21/2022   CO2 28 08/21/2022   Lab Results  Component Value Date   ALT 26 08/19/2022   AST 67 (H) 08/19/2022   ALKPHOS 68 08/19/2022   BILITOT 1.2 08/19/2022    Lab Results  Component Value Date   CHOL 140 11/11/2021   TRIG 128.0 11/11/2021   HDL 39.20 11/11/2021   LDLCALC 75 11/11/2021   No results found for: "HAV" Lab Results  Component Value Date   HEPBSAG No 06/28/2006   HEPBSAB Yes 06/28/2006   Lab Results  Component Value Date   HCVAB No 06/28/2006   Lab Results  Component Value Date   CHLAMYDIAWP Negative 03/18/2022   N Negative 03/18/2022   No results found for: "GCPROBEAPT" No results found for: "QUANTGOLD"     Assessment/Plan #HIV -CD4, VL ND, on April, 2024. OFF of ART since 1.5 due to cost Plan -Start dovato(pt never started and has been on genvoya). Given samples for 28 days of Davoto.  -Follow up in  one month  #Vaccination COVID Flu today Monkeypox PCV 20 needs Meningitis aged out HepA serology todya HEpB serology today Tdap Shingles  #Health maintenance -Quantiferon order today -RPR 1:2, 08/19/22 -HCV serology today -GC urine negative  -Lipid on atorvastatin -Colonoscopy discuss    Danelle Earthly, MD [facility]

## 2023-01-11 NOTE — Patient Instructions (Signed)
Please call [facility] at [phone] for f/u care

## 2023-01-11 NOTE — Telephone Encounter (Signed)
Had patient in office with [facility] [phone] Gunnar Fusi on the phone to sign up for [employer] Part D Medicare.  He was advised that it will be 5.20 taking from his social security check. Gunnar Fusi also advised him that if he decided to change his permeant address to his sisters in [place] that he would need to go to [employer] office to have it changed and contact [employer] in [place].  He also completed the LIS application with [employer] to see if they will pay the Part D premium.  I filled out RW application also.  Reference# [id number] F number for [employer] is [phone] to begin on October 1st.

## 2023-01-13 LAB — T-HELPER CELLS (CD4) COUNT (NOT AT ARMC)
CD4 % Helper T Cell: 43 % (ref 33–65)
CD4 T Cell Abs: 998 /uL (ref 400–1790)

## 2023-01-14 ENCOUNTER — Other Ambulatory Visit: Payer: Self-pay | Admitting: Pharmacist

## 2023-01-14 DIAGNOSIS — B20 Human immunodeficiency virus [HIV] disease: Secondary | ICD-10-CM

## 2023-01-14 LAB — COMPLETE METABOLIC PANEL WITH GFR
AG Ratio: 1.1 (calc) (ref 1.0–2.5)
ALT: 22 U/L (ref 9–46)
AST: 18 U/L (ref 10–35)
Albumin: 4.1 g/dL (ref 3.6–5.1)
Alkaline phosphatase (APISO): 90 U/L (ref 35–144)
BUN: 10 mg/dL (ref 7–25)
CO2: 25 mmol/L (ref 20–32)
Calcium: 10 mg/dL (ref 8.6–10.3)
Chloride: 103 mmol/L (ref 98–110)
Creat: 0.98 mg/dL (ref 0.70–1.22)
Globulin: 3.7 g/dL (ref 1.9–3.7)
Glucose, Bld: 105 mg/dL — ABNORMAL HIGH (ref 65–99)
Potassium: 3.8 mmol/L (ref 3.5–5.3)
Sodium: 140 mmol/L (ref 135–146)
Total Bilirubin: 0.8 mg/dL (ref 0.2–1.2)
Total Protein: 7.8 g/dL (ref 6.1–8.1)
eGFR: 77 mL/min/{1.73_m2} (ref >=60)

## 2023-01-14 LAB — HEPATITIS B CORE ANTIBODY, TOTAL: Hep B Core Total Ab: REACTIVE — AB

## 2023-01-14 LAB — CBC WITH DIFFERENTIAL/PLATELET
Absolute Monocytes: 558 {cells}/uL (ref 200–950)
Basophils Absolute: 149 {cells}/uL (ref 0–200)
Basophils Relative: 1.6 %
Eosinophils Absolute: 381 {cells}/uL (ref 15–500)
Eosinophils Relative: 4.1 %
HCT: 43.6 % (ref 38.5–50.0)
Hemoglobin: 13.7 g/dL (ref 13.2–17.1)
Lymphs Abs: 2567 {cells}/uL (ref 850–3900)
MCH: 27.6 pg (ref 27.0–33.0)
MCHC: 31.4 g/dL — ABNORMAL LOW (ref 32.0–36.0)
MCV: 87.7 fL (ref 80.0–100.0)
MPV: 11.3 fL (ref 7.5–12.5)
Monocytes Relative: 6 %
Neutro Abs: 5645 {cells}/uL (ref 1500–7800)
Neutrophils Relative %: 60.7 %
Platelets: 301 10*3/uL (ref 140–400)
RBC: 4.97 10*6/uL (ref 4.20–5.80)
RDW: 14 % (ref 11.0–15.0)
Total Lymphocyte: 27.6 %
WBC: 9.3 10*3/uL (ref 3.8–10.8)

## 2023-01-14 LAB — HEPATITIS A ANTIBODY, TOTAL: Hepatitis A AB,Total: REACTIVE — AB

## 2023-01-14 LAB — HIV-1 RNA QUANT-NO REFLEX-BLD
HIV 1 RNA Quant: 31 {copies}/mL — ABNORMAL HIGH
HIV-1 RNA Quant, Log: 1.49 {Log_copies}/mL — ABNORMAL HIGH

## 2023-01-14 LAB — QUANTIFERON-TB GOLD PLUS
Mitogen-NIL: 8.52 [IU]/mL
NIL: 0.02 [IU]/mL
QuantiFERON-TB Gold Plus: NEGATIVE
TB1-NIL: 0.08 [IU]/mL
TB2-NIL: 0.15 [IU]/mL

## 2023-01-14 LAB — HEPATITIS C ANTIBODY: Hepatitis C Ab: NONREACTIVE

## 2023-01-14 LAB — HEPATITIS B SURFACE ANTIGEN: Hepatitis B Surface Ag: NONREACTIVE

## 2023-01-14 LAB — HEPATITIS B SURFACE ANTIBODY,QUALITATIVE: Hep B S Ab: REACTIVE — AB

## 2023-01-14 MED ORDER — DOVATO 50-300 MG PO TABS
1.0000 | ORAL_TABLET | Freq: Every day | ORAL | Status: AC
Start: 2023-01-14 — End: 2023-02-11

## 2023-01-14 NOTE — Progress Notes (Signed)
 Medication Samples have been provided to the patient.  Drug name: Dovato        Strength: 50/300 mg         Qty: 28  Tablets (2 bottles) LOT: [id number]   Exp.Date: 11/25  Dosing instructions: Take one tablet by mouth once daily  The patient has been instructed regarding the correct time, dose, and frequency of taking this medication, including desired effects and most common side effects.   Margarite Gouge, PharmD, CPP, BCIDP, AAHIVP Clinical Pharmacist Practitioner Infectious Diseases Clinical Pharmacist [facility]

## 2023-02-09 ENCOUNTER — Ambulatory Visit: Payer: Medicare Other | Admitting: Internal Medicine

## 2023-02-10 ENCOUNTER — Ambulatory Visit (INDEPENDENT_AMBULATORY_CARE_PROVIDER_SITE_OTHER): Payer: Medicare Other | Admitting: Family Medicine

## 2023-02-10 ENCOUNTER — Encounter: Payer: Self-pay | Admitting: Family Medicine

## 2023-02-10 VITALS — BP 128/78 | HR 73 | Temp 97.7°F | Ht 65.0 in | Wt 128.4 lb

## 2023-02-10 DIAGNOSIS — R7303 Prediabetes: Secondary | ICD-10-CM

## 2023-02-10 DIAGNOSIS — N1831 Chronic kidney disease, stage 3a: Secondary | ICD-10-CM | POA: Diagnosis not present

## 2023-02-10 DIAGNOSIS — C61 Malignant neoplasm of prostate: Secondary | ICD-10-CM

## 2023-02-10 DIAGNOSIS — J439 Emphysema, unspecified: Secondary | ICD-10-CM

## 2023-02-10 DIAGNOSIS — C771 Secondary and unspecified malignant neoplasm of intrathoracic lymph nodes: Secondary | ICD-10-CM

## 2023-02-10 DIAGNOSIS — Z720 Tobacco use: Secondary | ICD-10-CM

## 2023-02-10 DIAGNOSIS — Z79899 Other long term (current) drug therapy: Secondary | ICD-10-CM

## 2023-02-10 DIAGNOSIS — E786 Lipoprotein deficiency: Secondary | ICD-10-CM

## 2023-02-10 DIAGNOSIS — J441 Chronic obstructive pulmonary disease with (acute) exacerbation: Secondary | ICD-10-CM

## 2023-02-10 DIAGNOSIS — R9721 Rising PSA following treatment for malignant neoplasm of prostate: Secondary | ICD-10-CM

## 2023-02-10 DIAGNOSIS — Z87828 Personal history of other (healed) physical injury and trauma: Secondary | ICD-10-CM

## 2023-02-10 DIAGNOSIS — Z91199 Patient's noncompliance with other medical treatment and regimen due to unspecified reason: Secondary | ICD-10-CM

## 2023-02-10 DIAGNOSIS — Z21 Asymptomatic human immunodeficiency virus [HIV] infection status: Secondary | ICD-10-CM

## 2023-02-10 DIAGNOSIS — I10 Essential (primary) hypertension: Secondary | ICD-10-CM

## 2023-02-10 DIAGNOSIS — Z8673 Personal history of transient ischemic attack (TIA), and cerebral infarction without residual deficits: Secondary | ICD-10-CM

## 2023-02-10 LAB — PSA: PSA: 51.82 ng/mL — ABNORMAL HIGH (ref 0.10–4.00)

## 2023-02-10 LAB — CBC WITH DIFFERENTIAL/PLATELET
Basophils Absolute: 0 10*3/uL (ref 0.0–0.1)
Basophils Relative: 0.1 % (ref 0.0–3.0)
Eosinophils Absolute: 0 10*3/uL (ref 0.0–0.7)
Eosinophils Relative: 0.1 % (ref 0.0–5.0)
HCT: 44.4 % (ref 39.0–52.0)
Hemoglobin: 14 g/dL (ref 13.0–17.0)
Lymphocytes Relative: 14.5 % (ref 12.0–46.0)
Lymphs Abs: 1.2 10*3/uL (ref 0.7–4.0)
MCHC: 31.5 g/dL (ref 30.0–36.0)
MCV: 86 fL (ref 78.0–100.0)
Monocytes Absolute: 0.3 10*3/uL (ref 0.1–1.0)
Monocytes Relative: 4.3 % (ref 3.0–12.0)
Neutro Abs: 6.5 10*3/uL (ref 1.4–7.7)
Neutrophils Relative %: 81 % — ABNORMAL HIGH (ref 43.0–77.0)
Platelets: 286 10*3/uL (ref 150.0–400.0)
RBC: 5.17 Mil/uL (ref 4.22–5.81)
RDW: 17.8 % — ABNORMAL HIGH (ref 11.5–15.5)
WBC: 8 10*3/uL (ref 4.0–10.5)

## 2023-02-10 LAB — VITAMIN D 25 HYDROXY (VIT D DEFICIENCY, FRACTURES): VITD: 27.49 ng/mL — ABNORMAL LOW (ref 30.00–100.00)

## 2023-02-10 LAB — COMPREHENSIVE METABOLIC PANEL
ALT: 36 U/L (ref 0–53)
AST: 17 U/L (ref 0–37)
Albumin: 4.1 g/dL (ref 3.5–5.2)
Alkaline Phosphatase: 83 U/L (ref 39–117)
BUN: 18 mg/dL (ref 6–23)
CO2: 28 meq/L (ref 19–32)
Calcium: 10 mg/dL (ref 8.4–10.5)
Chloride: 100 meq/L (ref 96–112)
Creatinine, Ser: 0.95 mg/dL (ref 0.40–1.50)
GFR: 74.37 mL/min (ref >60.00)
Glucose, Bld: 92 mg/dL (ref 70–99)
Potassium: 4.4 meq/L (ref 3.5–5.1)
Sodium: 140 meq/L (ref 135–145)
Total Bilirubin: 0.6 mg/dL (ref 0.2–1.2)
Total Protein: 7.2 g/dL (ref 6.0–8.3)

## 2023-02-10 LAB — LIPID PANEL
Cholesterol: 182 mg/dL (ref 0–200)
HDL: 76 mg/dL (ref >39.00)
LDL Cholesterol: 92 mg/dL (ref 0–99)
NonHDL: 105.93
Total CHOL/HDL Ratio: 2
Triglycerides: 68 mg/dL (ref 0.0–149.0)
VLDL: 13.6 mg/dL (ref 0.0–40.0)

## 2023-02-10 LAB — HEMOGLOBIN A1C: Hgb A1c MFr Bld: 5.5 % (ref 4.6–6.5)

## 2023-02-10 LAB — MICROALBUMIN / CREATININE URINE RATIO
Creatinine,U: 117.4 mg/dL
Microalb Creat Ratio: 1.7 mg/g (ref 0.0–30.0)
Microalb, Ur: 2 mg/dL — ABNORMAL HIGH (ref 0.0–1.9)

## 2023-02-10 LAB — PHOSPHORUS: Phosphorus: 4 mg/dL (ref 2.3–4.6)

## 2023-02-10 MED ORDER — BREZTRI AEROSPHERE 160-9-4.8 MCG/ACT IN AERO: 2.0000 | INHALATION_SPRAY | Freq: Two times a day (BID) | RESPIRATORY_TRACT | 11 refills | Status: DC

## 2023-02-10 MED ORDER — ALBUTEROL SULFATE HFA 108 (90 BASE) MCG/ACT IN AERS
2.0000 | INHALATION_SPRAY | Freq: Four times a day (QID) | RESPIRATORY_TRACT | 2 refills | Status: DC | PRN
Start: 2023-02-10 — End: 2024-01-15

## 2023-02-10 NOTE — Progress Notes (Unsigned)
Ph: [phone] Fax: [phone]   Patient ID: Charles Daniel, male    DOB: 03-05-1941, 82 y.o.   MRN: [id number]  This visit was conducted in person.  BP 128/78 (BP Location: Left Arm, Patient Position: Sitting, Cuff Size: Normal)   Pulse 73   Temp 97.7 F (36.5 C) (Temporal)   Ht 5\' 5"  (1.651 m)   Wt 128 lb 6 oz (58.2 kg)   SpO2 99%   BMI 21.36 kg/m    CC: hosp f/u visit  Subjective:   HPI: Charles Daniel is a 82 y.o. male presenting on 02/10/2023 for Hospitalization Follow-up   Last seen 10/2022.  Missed physical appt with labs 11/2022.  Currently separated from wife for 6-7 months, staying with sister who lives in [place], [place]. He drove here today. He states wife now knows about his HIV status.   HIV followed by ID last saw Dr Thedore Mins 01/11/2023 now should be on Dovato, missed 1.5 months of ART due to cost concerns. Restarted (samples provided x 1 month) with plan RTC 1 mo ID f/u visit. Has appt this Friday 02/12/2023.   Recent hospitalization for COPD exacerbation.  Hospital records reviewed. Med rec unable to be performed as pt was not sure what he's taking, didn't bring meds into office visit.  He states he's taking red inhaler twice daily (suspect albuterol proair), and other one twice daily as well but it seems it may be green albuterol ventolin inhaler.   Hospitalized January 14, 2023 at [facility] in [place] for COPD exacerbation, discharged on Trelegy Ellipta 100/62.5/25 1 puff daily and prednisone taper, completed antibiotic course.  Again hospitalized October 3-4, 2024 at [facility] in [place] for recurrent COPD exacerbation, treated with cefuroxime 5-day course and prednisone taper, continued Breztri inhaler 2 puffs twice daily in place of Trelegy.  He stated he had quit smoking 3 weeks prior.  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  He states he has not had wrist surgery.  Apparently he was previously hospitalized at [facility] in August 2024 after fall with head laceration, needed to be transported via [facility] to [facility] for this. Sister said he was sitting on porch and he fell forward sustaining large left frontal forehead laceration. Since then he's having worsening trouble with memory. Unclear when this happened as no records are available.   Overall feeling well. No dyspnea, cough, wheezing (chronic). Notes easy fatigue. No fevers/chills.   Known history of recurrent strokes, he should be on blood pressure medications as well as aspirin and Plavix.  Unsure if he is taking these.  Home health not set up.  Other follow up appointments scheduled: ID 02/12/2023 Thedore Mins) ______________________________________________________________________ Hospital admission: see above Hospital discharge: see above TCM f/u phone call: not performed      Relevant past medical, surgical, family and social history reviewed and updated as indicated. Interim medical history since our last visit reviewed. Allergies and medications reviewed and updated. Outpatient Medications Prior to Visit  Medication Sig Dispense Refill   amLODipine (NORVASC) 10 MG tablet Take 1 tablet (10 mg total) by mouth daily. 90 tablet 3   aspirin EC 81 MG tablet Take 1 tablet (81 mg total) by mouth daily. Swallow whole.     atorvastatin (LIPITOR) 40 MG tablet Take 40 mg by mouth daily.  Cholecalciferol (VITAMIN D3) 25 MCG (1000 UT) CAPS Take 1 capsule (1,000 Units total) by mouth daily. 30 capsule    clopidogrel (PLAVIX) 75 MG tablet Take 75 mg by mouth daily.     dolutegravir-lamiVUDine (DOVATO) 50-300 MG tablet Take 1 tablet by mouth daily. 30 tablet 0   dolutegravir-lamiVUDine (DOVATO) 50-300 MG tablet Take 1 tablet by mouth daily for 28 days.     feeding  supplement (ENSURE ENLIVE / ENSURE PLUS) LIQD Take 237 mLs by mouth daily.     levETIRAcetam (KEPPRA) 250 MG tablet SMARTSIG:1 Tablet(s) By Mouth Every 12 Hours     lisinopril-hydrochlorothiazide (ZESTORETIC) 20-12.5 MG tablet Take 2 tablets by mouth daily. 180 tablet 3   albuterol (VENTOLIN HFA) 108 (90 Base) MCG/ACT inhaler Inhale 2 puffs into the lungs every 6 (six) hours as needed for wheezing or shortness of breath. This is rescue inhaler 8 g 2   fluticasone-salmeterol (ADVAIR) 250-50 MCG/ACT AEPB Inhale 1 puff into the lungs in the morning and at bedtime. 60 each 6   No facility-administered medications prior to visit.     Per HPI unless specifically indicated in ROS section below Review of Systems  Objective:  BP 128/78 (BP Location: Left Arm, Patient Position: Sitting, Cuff Size: Normal)   Pulse 73   Temp 97.7 F (36.5 C) (Temporal)   Ht 5\' 5"  (1.651 m)   Wt 128 lb 6 oz (58.2 kg)   SpO2 99%   BMI 21.36 kg/m   Wt Readings from Last 3 Encounters:  02/10/23 128 lb 6 oz (58.2 kg)  01/11/23 127 lb (57.6 kg)  10/19/22 128 lb 2 oz (58.1 kg)      Physical Exam Vitals and nursing note reviewed.  Constitutional:      Appearance: Normal appearance. He is not ill-appearing.  HENT:     Head: Normocephalic and atraumatic.      Comments: Large scar to L forehead with mild keloid formation    Mouth/Throat:     Mouth: Mucous membranes are moist.     Pharynx: Oropharynx is clear. No oropharyngeal exudate or posterior oropharyngeal erythema.  Eyes:     Extraocular Movements: Extraocular movements intact.     Pupils: Pupils are equal, round, and reactive to light.  Cardiovascular:     Rate and Rhythm: Normal rate and regular rhythm.     Pulses: Normal pulses.     Heart sounds: Normal heart sounds. No murmur heard. Pulmonary:     Effort: Pulmonary effort is normal. No respiratory distress.     Breath sounds: Wheezing (mild expiratory diffusely) and rhonchi (diffuse, predominant  bibasilarly) present. No rales.  Abdominal:     General: Bowel sounds are normal. There is no distension.     Palpations: Abdomen is soft. There is no mass.     Tenderness: There is no abdominal tenderness. There is no guarding or rebound.     Hernia: No hernia is present.  Musculoskeletal:        General: Swelling present.     Right lower leg: No edema.     Left lower leg: No edema.  Skin:    General: Skin is warm and dry.     Findings: No rash.  Neurological:     Mental Status: He is alert.  Psychiatric:        Mood and Affect: Mood normal.        Behavior: Behavior normal.       Results for orders placed or performed in visit  on 02/10/23  CBC with Differential/Platelet  Result Value Ref Range   WBC 8.0 4.0 - 10.5 K/uL   RBC 5.17 4.22 - 5.81 Mil/uL   Hemoglobin 14.0 13.0 - 17.0 g/dL   HCT 65.7 84.6 - 96.2 %   MCV 86.0 78.0 - 100.0 fl   MCHC 31.5 30.0 - 36.0 g/dL   RDW 95.2 (H) 84.1 - 32.4 %   Platelets 286.0 150.0 - 400.0 K/uL   Neutrophils Relative % 81.0 (H) 43.0 - 77.0 %   Lymphocytes Relative 14.5 12.0 - 46.0 %   Monocytes Relative 4.3 3.0 - 12.0 %   Eosinophils Relative 0.1 0.0 - 5.0 %   Basophils Relative 0.1 0.0 - 3.0 %   Neutro Abs 6.5 1.4 - 7.7 K/uL   Lymphs Abs 1.2 0.7 - 4.0 K/uL   Monocytes Absolute 0.3 0.1 - 1.0 K/uL   Eosinophils Absolute 0.0 0.0 - 0.7 K/uL   Basophils Absolute 0.0 0.0 - 0.1 K/uL  Microalbumin / creatinine urine ratio  Result Value Ref Range   Microalb, Ur 2.0 (H) 0.0 - 1.9 mg/dL   Creatinine,U 401.0 mg/dL   Microalb Creat Ratio 1.7 0.0 - 30.0 mg/g  VITAMIN D 25 Hydroxy (Vit-D Deficiency, Fractures)  Result Value Ref Range   VITD 27.49 (L) 30.00 - 100.00 ng/mL  Phosphorus  Result Value Ref Range   Phosphorus 4.0 2.3 - 4.6 mg/dL  Hemoglobin U7O  Result Value Ref Range   Hgb A1c MFr Bld 5.5 4.6 - 6.5 %  Comprehensive metabolic panel  Result Value Ref Range   Sodium 140 135 - 145 mEq/L   Potassium 4.4 3.5 - 5.1 mEq/L   Chloride  100 96 - 112 mEq/L   CO2 28 19 - 32 mEq/L   Glucose, Bld 92 70 - 99 mg/dL   BUN 18 6 - 23 mg/dL   Creatinine, Ser 5.36 0.40 - 1.50 mg/dL   Total Bilirubin 0.6 0.2 - 1.2 mg/dL   Alkaline Phosphatase 83 39 - 117 U/L   AST 17 0 - 37 U/L   ALT 36 0 - 53 U/L   Total Protein 7.2 6.0 - 8.3 g/dL   Albumin 4.1 3.5 - 5.2 g/dL   GFR 64.40 >34.74 mL/min   Calcium 10.0 8.4 - 10.5 mg/dL  Lipid panel  Result Value Ref Range   Cholesterol 182 0 - 200 mg/dL   Triglycerides 25.9 0.0 - 149.0 mg/dL   HDL 56.38 >75.64 mg/dL   VLDL 33.2 0.0 - 95.1 mg/dL   LDL Cholesterol 92 0 - 99 mg/dL   Total CHOL/HDL Ratio 2    NonHDL 105.93   PSA  Result Value Ref Range   PSA 51.82 (H) 0.10 - 4.00 ng/mL    Assessment & Plan:   Problem List Items Addressed This Visit     HIV infection (HCC)    Appreciate ID care (Dr Thedore Mins).  Is now on Dovato ART - has a few pills left. Has f/u appt in 2 days with ID      Relevant Orders   AMB Referral to Pharmacy Medication Management   HTN (hypertension), malignant    Chronic, BP stable. Unsure what meds he's currently taking - see below.       Relevant Orders   AMB Referral to Pharmacy Medication Management   History of stroke without residual deficits    Last saw [facility] neurology Malvin Johns) 2022.  Should be on aspirin, statin, plavix.       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       Relevant Orders   AMB Referral to Pharmacy Medication Management   Ambulatory referral to Urology   COPD exacerbation [facility]) - Primary    Recent admissions x2 in the past month for COPD exacerbations.  Unclear current controller inhaler regimen - see below.       Relevant Medications   albuterol (VENTOLIN HFA) 108 (90 Base) MCG/ACT inhaler   Budeson-Glycopyrrol-Formoterol (BREZTRI AEROSPHERE) 160-9-4.8 MCG/ACT AERO    Other Relevant Orders   AMB Referral to Pharmacy Medication Management   COPD with emphysema (HCC)    Reviewed inhaler therapy as best able to - discussing difference between rescue and controller inhalers as per instructions. I asked him to bring all medicines to next visit.  He should be taking Breztri CONTROLLER inhaler 2 puffs BID every day  He should be taking albuterol RESCUE inhaler 2 puffs QID as needed for cough/dyspnea/wheeze.       Relevant Medications   albuterol (VENTOLIN HFA) 108 (90 Base) MCG/ACT inhaler   Budeson-Glycopyrrol-Formoterol (BREZTRI AEROSPHERE) 160-9-4.8 MCG/ACT AERO   Other Relevant Orders   AMB Referral to Pharmacy Medication Management   Tobacco use    States he quit smoking about 1 month ago. Congratulated, encouraged ongoing full cessation      Low HDL (under 40)    Update FLP in h/o stroke - unsure if he's taking statin. The ASCVD Risk score (Arnett DK, et al., 2019) failed to calculate for the following reasons:   The 2019 ASCVD risk score is only valid for ages 42 to 19   The patient has a prior MI or stroke diagnosis       CKD (chronic kidney disease) stage 3, GFR 30-59 ml/min (HCC)    Update labs.       Prediabetes    Update A1c.       Rising PSA following treatment for malignant neoplasm of prostate    Update PSA in anticipation of upcoming CPE.  ==> markedly elevated - will [facility] to [facility] (last seen 2023)      Relevant Orders   AMB Referral to Pharmacy Medication Management   Ambulatory referral to Urology   Non-adherence to medical treatment    Confusion over medications, polypharmacy, complicated social situation.  For these reasons, will refer to pharmacy team as well as social work team.       Relevant Orders   AMB Referral to Pharmacy Medication Management   History of traumatic head injury    I don't have access to [facility] records from hospitalization 12/2022. Will request this today.        Other Visit Diagnoses     Polypharmacy       Relevant Orders   AMB Referral to Pharmacy Medication Management        Meds ordered this encounter  Medications   albuterol (VENTOLIN HFA) 108 (90 Base) MCG/ACT inhaler    Sig: Inhale 2 puffs into the lungs every 6 (six) hours as needed for wheezing or shortness of breath. This is rescue inhaler    Dispense:  8 g    Refill:  2    Fill according to cost/affordability/insurance preference   Budeson-Glycopyrrol-Formoterol (BREZTRI AEROSPHERE) 160-9-4.8 MCG/ACT AERO    Sig: Inhale 2 puffs into the lungs in the morning and  at bedtime.    Dispense:  10.7 g    Refill:  11    Orders Placed This Encounter  Procedures   AMB Referral to Pharmacy Medication Management    Referral Priority:   Routine    Referral Type:   Consultation    Referral Reason:   Pharmacy Medication Management    Number of Visits Requested:   1   Ambulatory referral to Urology    Referral Priority:   Urgent    Referral Type:   Consultation    Referral Reason:   Specialty Services Required    Requested Specialty:   Urology    Number of Visits Requested:   1    Patient Instructions  Keep Friday 02/12/2023 appointment with Dr. Thedore Mins infectious diseases at 8:45 AM. We will request hospital records from [facility] from 12/2022 for stroke with head laceration.  Red and Green RESCUE inhalers (Ventolin albuterol) - use as needed 2 puffs as needed for shortness of breath/wheeze. Orange CONTROLLER inhaler Markus Daft) - use 2 puffs twice daily every day for prevention  Stay off cigarettes.  Labs today.   Bring in all your medicines to appointment next month.   Follow up plan: No follow-ups on file.  [name], MD

## 2023-02-10 NOTE — Patient Instructions (Addendum)
Keep Friday 02/12/2023 appointment with Dr. Thedore Mins infectious diseases at 8:45 AM. We will request hospital records from [facility] from 12/2022 for stroke with head laceration.  Red and Green RESCUE inhalers (Ventolin albuterol) - use as needed 2 puffs as needed for shortness of breath/wheeze. Orange CONTROLLER inhaler Markus Daft) - use 2 puffs twice daily every day for prevention  Stay off cigarettes.  Labs today.   Bring in all your medicines to appointment next month.

## 2023-02-11 ENCOUNTER — Other Ambulatory Visit (HOSPITAL_COMMUNITY): Payer: Self-pay

## 2023-02-11 ENCOUNTER — Telehealth: Payer: Self-pay

## 2023-02-11 DIAGNOSIS — Z87828 Personal history of other (healed) physical injury and trauma: Secondary | ICD-10-CM | POA: Insufficient documentation

## 2023-02-11 DIAGNOSIS — Z91199 Patient's noncompliance with other medical treatment and regimen due to unspecified reason: Secondary | ICD-10-CM | POA: Insufficient documentation

## 2023-02-11 LAB — PARATHYROID HORMONE, INTACT (NO CA): PTH: 59 pg/mL (ref 16–77)

## 2023-02-11 NOTE — Assessment & Plan Note (Signed)
I don't have access to [facility] records from hospitalization 12/2022. Will request this today.

## 2023-02-11 NOTE — Telephone Encounter (Signed)
[facility] Patient Advocate Encounter  Insurance verification completed.    The patient is insured through [employer]. Patient has Medicare and is not eligible for a copay card, but may be able to apply for patient assistance, if available.    MUST USE OUTSIDE PHARMACY  We will continue to follow to see if copay assistance is needed.  This test claim was processed through [facility]- copay amounts may vary at other pharmacies due to pharmacy/plan contracts, or as the patient moves through the different stages of their insurance plan.

## 2023-02-11 NOTE — Assessment & Plan Note (Signed)
Confusion over medications, polypharmacy, complicated social situation.  For these reasons, will refer to pharmacy team as well as social work team.

## 2023-02-11 NOTE — Assessment & Plan Note (Signed)
Update labs.  

## 2023-02-11 NOTE — Assessment & Plan Note (Signed)
States he quit smoking about 1 month ago. Congratulated, encouraged ongoing full cessation

## 2023-02-11 NOTE — Assessment & Plan Note (Signed)
Reviewed inhaler therapy as best able to - discussing difference between rescue and controller inhalers as per instructions. I asked him to bring all medicines to next visit.  He should be taking Breztri CONTROLLER inhaler 2 puffs BID every day  He should be taking albuterol RESCUE inhaler 2 puffs QID as needed for cough/dyspnea/wheeze.

## 2023-02-11 NOTE — Assessment & Plan Note (Addendum)
Overdue for urology f/u - will update PSA, may need return to uro.

## 2023-02-11 NOTE — Assessment & Plan Note (Signed)
Update A1c ?

## 2023-02-11 NOTE — Assessment & Plan Note (Signed)
Update FLP in h/o stroke - unsure if he's taking statin. The ASCVD Risk score (Arnett DK, et al., 2019) failed to calculate for the following reasons:   The 2019 ASCVD risk score is only valid for ages 22 to 44   The patient has a prior MI or stroke diagnosis

## 2023-02-11 NOTE — Assessment & Plan Note (Signed)
Chronic, BP stable. Unsure what meds he's currently taking - see below.

## 2023-02-11 NOTE — Assessment & Plan Note (Signed)
Appreciate ID care (Dr Thedore Mins).  Is now on Dovato ART - has a few pills left. Has f/u appt in 2 days with ID

## 2023-02-11 NOTE — Assessment & Plan Note (Addendum)
Last saw [facility] neurology Malvin Johns) 2022.  Should be on aspirin, statin, plavix.

## 2023-02-11 NOTE — Assessment & Plan Note (Addendum)
Update PSA in anticipation of upcoming CPE.  ==> markedly elevated - will refer back to [facility] (last seen 2023)

## 2023-02-11 NOTE — Assessment & Plan Note (Signed)
Recent admissions x2 in the past month for COPD exacerbations.  Unclear current controller inhaler regimen - see below.

## 2023-02-12 ENCOUNTER — Other Ambulatory Visit (HOSPITAL_COMMUNITY): Payer: Self-pay

## 2023-02-12 ENCOUNTER — Other Ambulatory Visit: Payer: Self-pay

## 2023-02-12 ENCOUNTER — Ambulatory Visit (INDEPENDENT_AMBULATORY_CARE_PROVIDER_SITE_OTHER): Payer: Medicare Other | Admitting: Internal Medicine

## 2023-02-12 ENCOUNTER — Encounter: Payer: Self-pay | Admitting: Internal Medicine

## 2023-02-12 VITALS — Wt 131.0 lb

## 2023-02-12 DIAGNOSIS — B20 Human immunodeficiency virus [HIV] disease: Secondary | ICD-10-CM

## 2023-02-12 DIAGNOSIS — Z23 Encounter for immunization: Secondary | ICD-10-CM | POA: Diagnosis not present

## 2023-02-12 NOTE — Progress Notes (Signed)
[facility]     HPI: Charles Daniel is a 82 y.o. male  for HIV. Pt states he has has a cva and hurt his head, hospitalized at [facility] in Aug, 2024(brought letter in). Off of ART for 1.5 weeks  as he could not afford his meds. Pt was switched from genvoya to dovato in April , 2024 to avoid taf/cobi.  Today 02/12/23: Doing well. Tolerating dovato. Sttes he needs additona additance as dovato copay is 4k. No missed dovato doses Date of dia::gnosis:  ART exposure Past OIs Risk factors: Herterosexual    Social: Occupation: Youth worker, retired.  Housing: staying wit sister Support: Understanding of HIV: Etoh/drug/tobacco use: no/no/no Etoh/drug/tobacco use:  Past Medical History:  Diagnosis Date   Bilateral hydrocele 2012   Depression    Diverticulosis 2013   by colonoscopy   Emphysema lung (HCC) 03/2014    by CXR, remote smoking history   History of colon cancer    2007 --  S/P RECTOSIGMOID COLECTOMY--  NO CHEMORADIATION--  NO RECURRENCE   History of CVA (cerebrovascular accident)    2003-  RIGHT MIDDLE CVA---   NO RESIDUAL   History of hepatitis B    REMOTE AND INACTIVE  PER DOCUMENTATION   History of syphilis    SECONDARY SYPHILITIS TX'D IN 1998  PER DOCUMENTATION   History of tuberculosis    LATENT TB  TX'D X12  MONTHS IN 1995   HIV infection (HCC)    DX 1995--  MONITORED BY INFECTIOUS DISEASE (DR CAMPBELL)   Hypertension    Prostate carcinoma (HCC) dx 09/2012   T1c, brachytherapy/seed implant Vernie Ammons, Kathrynn Running)    Past Surgical History:  Procedure Laterality Date   COLONOSCOPY  08/2011   3 polyps, diverticulosis, rec rpt 5 yrs Arlyce Dice)   COLONOSCOPY  10/2016   7 TAs, diverticulosis, no rpt recommended ([name])   INGUINAL HERNIA REPAIR  1994   UNILATERAL   LOW ANTERIOR RESECTION RECTOSIGMOID COLON  03-09-2006   PROSTATE BIOPSY  09/2012   53cc  (MD OFFICE)   RADIOACTIVE SEED IMPLANT N/A 01/20/2013   Procedure: RADIOACTIVE SEED IMPLANT;   Surgeon: Garnett Farm, MD;  as well as Rose Phi MD   TRANSTHORACIC ECHOCARDIOGRAM  11-16-2001   LV WALL THICKNESS MODERATELY INCREASED/  EF 55-65%/ LVSF NORMAL    Family History  Problem Relation Age of Onset   Cancer Sister        breast   Breast cancer Sister    Cancer Brother 14       prostate, treated with seed implant   Cancer Brother        prostate   Cancer Brother        prostate   Cancer Daughter        breast   Cancer Other        prostate   Cancer Father        unsure   CAD Neg Hx    Stroke Neg Hx    Diabetes Neg Hx    Current Outpatient Medications on File Prior to Visit  Medication Sig Dispense Refill   albuterol (VENTOLIN HFA) 108 (90 Base) MCG/ACT inhaler Inhale 2 puffs into the lungs every 6 (six) hours as needed for wheezing or shortness of breath. This is rescue inhaler 8 g 2   amLODipine (NORVASC) 10 MG tablet Take 1 tablet (10 mg total) by mouth daily. 90 tablet 3   aspirin EC 81 MG  tablet Take 1 tablet (81 mg total) by mouth daily. Swallow whole.     atorvastatin (LIPITOR) 40 MG tablet Take 40 mg by mouth daily.     Budeson-Glycopyrrol-Formoterol (BREZTRI AEROSPHERE) 160-9-4.8 MCG/ACT AERO Inhale 2 puffs into the lungs in the morning and at bedtime. 10.7 g 11   Cholecalciferol (VITAMIN D3) 25 MCG (1000 UT) CAPS Take 1 capsule (1,000 Units total) by mouth daily. 30 capsule    clopidogrel (PLAVIX) 75 MG tablet Take 75 mg by mouth daily.     dolutegravir-lamiVUDine (DOVATO) 50-300 MG tablet Take 1 tablet by mouth daily. 30 tablet 0   feeding supplement (ENSURE ENLIVE / ENSURE PLUS) LIQD Take 237 mLs by mouth daily.     levETIRAcetam (KEPPRA) 250 MG tablet SMARTSIG:1 Tablet(s) By Mouth Every 12 Hours     lisinopril-hydrochlorothiazide (ZESTORETIC) 20-12.5 MG tablet Take 2 tablets by mouth daily. 180 tablet 3   No current facility-administered medications on file prior to visit.    No Known Allergies    Lab Results HIV 1 RNA Quant  Date Value   01/11/2023 31 Copies/mL (H)  08/19/2022 <20 copies/mL  03/18/2022 Not Detected Copies/mL   CD4 T Cell Abs (/uL)  Date Value  01/11/2023 998  03/18/2022 631  12/02/2020 975   No results found for: "HIV1GENOSEQ" Lab Results  Component Value Date   WBC 8.0 02/10/2023   HGB 14.0 02/10/2023   HCT 44.4 02/10/2023   MCV 86.0 02/10/2023   PLT 286.0 02/10/2023    Lab Results  Component Value Date   CREATININE 0.95 02/10/2023   BUN 18 02/10/2023   NA 140 02/10/2023   K 4.4 02/10/2023   CL 100 02/10/2023   CO2 28 02/10/2023   Lab Results  Component Value Date   ALT 36 02/10/2023   AST 17 02/10/2023   ALKPHOS 83 02/10/2023   BILITOT 0.6 02/10/2023    Lab Results  Component Value Date   CHOL 182 02/10/2023   TRIG 68.0 02/10/2023   HDL 76.00 02/10/2023   LDLCALC 92 02/10/2023   Lab Results  Component Value Date   HAV REACTIVE (A) 01/11/2023   Lab Results  Component Value Date   HEPBSAG NON-REACTIVE 01/11/2023   HEPBSAB REACTIVE (A) 01/11/2023   Lab Results  Component Value Date   HCVAB No 06/28/2006   Lab Results  Component Value Date   CHLAMYDIAWP Negative 03/18/2022   N Negative 03/18/2022   No results found for: "GCPROBEAPT" No results found for: "QUANTGOLD"  Assessment/Plan #HIV #HIV -CD4 998, VL 38(off of ART 1.5 weeks), on 9/9 Plan -Conitnue dovato. Gave pt pills samples x 1 month. Hcounsled ot pick up meds at [facility](additional assistance should help with copay) -Labs today -Follow up in two months   #Vaccination COVID today 02/12/23 Flu UTD 01/11/23 Monkeypox PCV 20 needs Meningitis aged out HepA immune HEpB c and S ab+ Tdap Shingles   #Health maintenance -Quantiferon negative 9/9/024 -RPR 1:2, 08/19/22 -HCV serology today -GC urine negative  -Lipid on atorvastatin -Colonoscopy discussed today, pt states he thinks he has colonsocpywithin the last 10 years, without follow-up    Danelle Earthly, MD [facility] I have personally spent 35 minutes involved in face-to-face and non-face-to-face activities for this patient on the day of the visit. Professional time spent includes the following activities: Preparing to see the patient (review of tests), Obtaining and/or reviewing separately obtained history (admission/discharge record), Performing a medically appropriate examination and/or evaluation ,  Ordering medications/tests/procedures, referring and communicating with other health care professionals, Documenting clinical information in the EMR, Independently interpreting results (not separately reported), Communicating results to the patient/family/caregiver, Counseling and educating the patient/family/caregiver and Care coordination (not separately reported).

## 2023-02-15 ENCOUNTER — Telehealth: Payer: Self-pay

## 2023-02-15 LAB — COMPLETE METABOLIC PANEL WITH GFR
AG Ratio: 1.2 (calc) (ref 1.0–2.5)
ALT: 31 U/L (ref 9–46)
AST: 16 U/L (ref 10–35)
Albumin: 3.8 g/dL (ref 3.6–5.1)
Alkaline phosphatase (APISO): 72 U/L (ref 35–144)
BUN: 14 mg/dL (ref 7–25)
CO2: 30 mmol/L (ref 20–32)
Calcium: 9.6 mg/dL (ref 8.6–10.3)
Chloride: 103 mmol/L (ref 98–110)
Creat: 1.09 mg/dL (ref 0.70–1.22)
Globulin: 3.2 g/dL (ref 1.9–3.7)
Glucose, Bld: 78 mg/dL (ref 65–99)
Potassium: 3.7 mmol/L (ref 3.5–5.3)
Sodium: 142 mmol/L (ref 135–146)
Total Bilirubin: 0.4 mg/dL (ref 0.2–1.2)
Total Protein: 7 g/dL (ref 6.1–8.1)
eGFR: 68 mL/min/{1.73_m2} (ref >=60)

## 2023-02-15 LAB — CBC WITH DIFFERENTIAL/PLATELET
Absolute Monocytes: 1155 {cells}/uL — ABNORMAL HIGH (ref 200–950)
Basophils Absolute: 66 {cells}/uL (ref 0–200)
Basophils Relative: 0.6 %
Eosinophils Absolute: 308 {cells}/uL (ref 15–500)
Eosinophils Relative: 2.8 %
HCT: 45.8 % (ref 38.5–50.0)
Hemoglobin: 14.1 g/dL (ref 13.2–17.1)
Lymphs Abs: 3146 {cells}/uL (ref 850–3900)
MCH: 27.2 pg (ref 27.0–33.0)
MCHC: 30.8 g/dL — ABNORMAL LOW (ref 32.0–36.0)
MCV: 88.4 fL (ref 80.0–100.0)
MPV: 11.3 fL (ref 7.5–12.5)
Monocytes Relative: 10.5 %
Neutro Abs: 6325 {cells}/uL (ref 1500–7800)
Neutrophils Relative %: 57.5 %
Platelets: 308 10*3/uL (ref 140–400)
RBC: 5.18 10*6/uL (ref 4.20–5.80)
RDW: 15.4 % — ABNORMAL HIGH (ref 11.0–15.0)
Total Lymphocyte: 28.6 %
WBC: 11 10*3/uL — ABNORMAL HIGH (ref 3.8–10.8)

## 2023-02-15 LAB — HIV-1 RNA QUANT-NO REFLEX-BLD
HIV 1 RNA Quant: NOT DETECTED {copies}/mL
HIV-1 RNA Quant, Log: NOT DETECTED {Log_copies}/mL

## 2023-02-15 LAB — T-HELPER CELLS (CD4) COUNT (NOT AT ARMC)
Absolute CD4: 1449 {cells}/uL (ref 490–1740)
CD4 T Helper %: 49 % (ref 30–61)
Total lymphocyte count: 2941 {cells}/uL (ref 850–3900)

## 2023-02-15 NOTE — Telephone Encounter (Signed)
Attempted to contact pt. No answer, no vm. Need to relay lab results and Dr Timoteo Expose message (see Labs, Result Notes- 02/10/23).  Labs/Dr G's message: Your PSA prostate level continues rising. You need to return to [facility]. A new referral was placed. You may call [phone] for an appt.  Your blood counts remain stable. Urine protein, kidneys liver, sugar and cholesterol levels are normal.  Your vit D was mildly low. I recommend you take vitamin D3 1000 units daily.  Also, I've also placed a referral to the pharmacy team to contact you to review your medications.

## 2023-02-16 ENCOUNTER — Telehealth: Payer: Self-pay

## 2023-02-16 NOTE — Progress Notes (Signed)
Care Guide Note  02/16/2023 Name: Charles Daniel MRN: [id number] DOB: 11/25/1940  Referred by: [name], MD Reason for referral : Care Management (Outreach to schedule with Pharm d and RNCM )   Charles Daniel is a 82 y.o. year old male who is a primary care patient of [name], MD. Charles Daniel was referred to the pharmacist for assistance related to HTN.    Successful contact was made with the patient to discuss pharmacy services including being ready for the pharmacist to call at least 5 minutes before the scheduled appointment time, to have medication bottles and any blood sugar or blood pressure readings ready for review. The patient agreed to meet with the pharmacist via with the pharmacist via telephone visit on (date/time).  02/24/2023  Penne Lash, RMA Care Guide [facility]  [place], [place] [address] Direct Dial: [phone] [email or web address]

## 2023-02-16 NOTE — Progress Notes (Signed)
Care Guide Note  02/16/2023 Name: Charles Daniel MRN: [id number] DOB: 01-05-1941  Referred by: [name], MD Reason for referral : Care Management (Outreach to schedule with Pharm d and RNCM )   Charles Daniel is a 82 y.o. year old male who is a primary care patient of [name], MD. Tina Wlodarczyk was referred to the pharmacist for assistance related to HTN and COPD.    An unsuccessful telephone outreach was attempted today to contact the patient who was referred to the pharmacy team for assistance with medication management. Additional attempts will be made to contact the patient.   Penne Lash, RMA Care Guide [facility]  [place], [place] [address] Direct Dial: [phone] [email or web address]

## 2023-02-16 NOTE — Telephone Encounter (Signed)
Spoke with pt starting to relay results and Dr Timoteo Expose message. However, pt stopped me and requested info be mailed to address in chart.   Mailed all info to pt.

## 2023-02-16 NOTE — Progress Notes (Signed)
Care Management   Note  02/16/2023 Name: Lysle Smolinski MRN: [id number] DOB: 1940-10-12  Luian Nerison is a 82 y.o. year old male who is a primary care patient of [name], MD. I reached out to Marijo Sanes by phone today offer care management services.   Mr. Jha was given information about care management services today including:  Care management services include personalized support from designated clinical staff, including individualized plan of care and coordination with other care providers 24/7 contact phone numbers for assistance for urgent and routine care needs. The patient may stop care management services at any time by phone call to the office staff.  Patient agreed to services and verbal consent obtained.   Follow up plan: Telephone appointment with care management team member scheduled for:02/19/2023  Penne Lash, RMA Care Guide [facility]  [place], [place] [address] Direct Dial: [phone] [email or web address]

## 2023-02-16 NOTE — Telephone Encounter (Signed)
Patient returned call regarding labs, would like a call back when possible.

## 2023-02-16 NOTE — Telephone Encounter (Addendum)
Attempted to contact pt. No answer, no vm. Need to relay lab results and Dr Timoteo Expose message (see Labs, Result Notes- 02/10/23).  Labs/Dr G's message: Your PSA prostate level continues rising. You need to return to [facility]. A new referral was placed. You may call [phone] for an appt.  Your blood counts remain stable. Urine protein, kidneys liver, sugar and cholesterol levels are normal.  Your vit D was mildly low. I recommend you take vitamin D3 1000 units daily.  Also, I've also placed a referral to the pharmacy team to contact you to review your medications.

## 2023-02-18 ENCOUNTER — Telehealth: Payer: Self-pay | Admitting: Family Medicine

## 2023-02-18 ENCOUNTER — Other Ambulatory Visit: Payer: Self-pay | Admitting: Pharmacist

## 2023-02-18 DIAGNOSIS — B20 Human immunodeficiency virus [HIV] disease: Secondary | ICD-10-CM

## 2023-02-18 MED ORDER — DOVATO 50-300 MG PO TABS: 1.0000 | ORAL_TABLET | Freq: Every day | ORAL | Status: AC

## 2023-02-18 NOTE — Progress Notes (Signed)
Medication Samples have been provided to the patient.  Drug name: Dovato        Strength: 50/300 mg         Qty: 2 bottles (28 tablets)   LOT: [id number]   Exp.Date: 03/2024  Dosing instructions: Take one tablet by mouth once daily  The patient has been instructed regarding the correct time, dose, and frequency of taking this medication, including desired effects and most common side effects.   Kazi Reppond L. Jannette Fogo, PharmD, BCIDP, AAHIVP, CPP Clinical Pharmacist Practitioner Infectious Diseases Clinical Pharmacist [facility] 04/15/2020, 10:07 AM

## 2023-02-18 NOTE — Telephone Encounter (Signed)
[facility] called in regarding referral that was sent over to them.They reached out to the patient and he stated that  do not need to see urology because he do not have prostate cancer. She said that they are holding an appointment for him for 03/05/2023 at 1pm,and was wondering if we could reach out to him just to talk to him so get him to see them.

## 2023-02-19 ENCOUNTER — Other Ambulatory Visit: Payer: Self-pay

## 2023-02-19 NOTE — Telephone Encounter (Signed)
Spoke with pt relaying Dr Timoteo Expose message. Pt verbalizes understanding and agrees to keep uro appt.

## 2023-02-19 NOTE — Telephone Encounter (Signed)
See result note section. Please notify patient - PSA has increased to 50 which is very high, concern for recurrence of prostate cancer.  Agree with urology appt 03/05/2023 please have him keep appt.

## 2023-02-19 NOTE — Patient Outreach (Signed)
Care Management   Visit Note  02/19/2023 Name: Charles Daniel MRN: [id number] DOB: 1940/10/12  Subjective: Charles Daniel is a 82 y.o. year old male who is a primary care patient of [name], MD. The Care Management team was consulted for assistance.      Engaged with patient spoke with patient by telephone.    Goals Addressed             This Visit's Progress    RNCM Care Management for Chronic Disease Management: COPD, HTN, HIV       Current Barriers:  Knowledge Deficits related to plan of care for management of HTN, COPD, and HIV  Chronic Disease Management support and education needs related to HTN, COPD, and HIV  Non-adherence to prescribed medication regimen Non-compliance with the medical plan of care Currently lives with his sister, him and his wife are separated, he ask for his wife's number to be removed from contacts  RNCM Clinical Goal(s):  Patient will verbalize basic understanding of  HTN, COPD, and HIV disease process and self health management plan as evidenced by seeing the provider at scheduled visits, following the plan of care, lab work stable, and trips to the ED reduced take all medications exactly as prescribed and will call provider for medication related questions as evidenced by of compliance with medication regimen and working with pharm D to understand medications/changes/questions/concerns attend all scheduled medical appointments: with pcp and specialist as evidenced by keeping appointments  demonstrate Improved and Ongoing health management independence as evidenced by effective management of COPD, HTN, HIV, and other chronic conditions  continue to work with RN Care Manager to address care management and care coordination needs related to  HTN, COPD, and HIV as evidenced by adherence to CM Team Scheduled appointments work with pharmacist to address medication reconciliation and understanding his medication regimen related toHTN, COPD, and HIV  and other chronic conditions  as evidenced by review or EMR and patient or pharmacist report demonstrate a decrease in HTN, COPD, and HIV exacerbations as evidenced by following the plan of care and no acute ED visits or hospital admissions demonstrate ongoing self health care management ability to effectively manage chronic conditions  as evidenced by working with the team to establish adherence of the care plan  through collaboration with RN Care manager, provider, and care team.   Interventions: Evaluation of current treatment plan related to  self management and patient's adherence to plan as established by provider   COPD Interventions:  (Status:  New goal. and Goal on track:  Yes.) Long Term Goal Provided patient with basic written and verbal COPD education on self care/management/and exacerbation prevention Advised patient to track and manage COPD triggers Provided instruction about proper use of medications used for management of COPD including inhalers Advised patient to self assesses COPD action plan zone and make appointment with provider if in the yellow zone for 48 hours without improvement Advised patient to engage in light exercise as tolerated 3-5 days a week to aid in the the management of COPD Provided education about and advised patient to utilize infection prevention strategies to reduce risk of respiratory infection Discussed the importance of adequate rest and management of fatigue with COPD Screening for signs and symptoms of depression related to chronic disease state  Assessed social determinant of health barriers Several ED and Hospital visits for COPD exacerbation in the last 6 months   HIV Management  (Status:  New goal. and Goal on track:  Yes.)  Long Term Goal Evaluation of current treatment plan related to  HIV , Family and relationship dysfunction, Literacy concerns, Memory Deficits, and and history of non-compliance to the plan of care  self-management and  patient's adherence to plan as established by provider. Discussed plans with patient for ongoing care management follow up and provided patient with direct contact information for care management team Evaluation of current treatment plan related to HIV management  and patient's adherence to plan as established by provider Reviewed medications with patient and discussed compliance and reviewed upcoming pharm D appointment Reviewed scheduled/upcoming provider appointments including 03-19-2023 at 230 pm with the pcp, reminder given today Pharmacy referral for medication reconciliation, and adherence. Has an appointment for 02-24-2023 and 10 am, reminder give today Discussed plans with patient for ongoing care management follow up and provided patient with direct contact information for care management team Advised patient to discuss changes in HIV disease process/questions/concerns with provider Screening for signs and symptoms of depression related to chronic disease state  Assessed social determinant of health barriers  Hypertension Interventions:  (Status:  New goal. and Goal on track:  Yes.) Long Term Goal Last practice recorded BP readings:  BP Readings from Last 3 Encounters:  02/10/23 128/78  01/11/23 134/85  10/19/22 138/82    Most recent eGFR/CrCl:  Lab Results  Component Value Date   EGFR 68 02/12/2023    No components found for: "CRCL"  Evaluation of current treatment plan related to hypertension self management and patient's adherence to plan as established by provider Provided education to patient re: stroke prevention, s/s of heart attack and stroke Reviewed medications with patient and discussed importance of compliance Discussed plans with patient for ongoing care management follow up and provided patient with direct contact information for care management team Reviewed scheduled/upcoming provider appointments including: 03-19-2023 at 230 pm with pcp Advised patient to  discuss changes in HTN or heart healthy with provider Provided education on prescribed diet review of heart healthy diet. The patient states he has a good appetite and does not use salt Discussed complications of poorly controlled blood pressure such as heart disease, stroke, circulatory complications, vision complications, kidney impairment, sexual dysfunction Screening for signs and symptoms of depression related to chronic disease state  Assessed social determinant of health barriers    Patient Goals/Self-Care Activities: Take all medications as prescribed Attend all scheduled provider appointments Call pharmacy for medication refills 3-7 days in advance of running out of medications Call provider office for new concerns or questions  call the Suicide and Crisis Lifeline: 988 call the [place] National Suicide Prevention Lifeline: (346)799-6414 or TTY: 220-205-5775 TTY 602-097-5179) to talk to a trained counselor call 1-800-273-TALK (toll free, 24 hour hotline) go to [facility] [address] [place] [phone]) if experiencing a Mental Health or Behavioral Health Crisis  eliminate smoking in my home identify and remove indoor air pollutants limit outdoor activity during cold weather do breathing exercises every day develop a rescue plan eliminate symptom triggers at home follow rescue plan if symptoms flare-up learn about high blood pressure call doctor for signs and symptoms of high blood pressure keep all doctor appointments take medications for blood pressure exactly as prescribed  Follow Up Plan:  Telephone follow up appointment with care management team member scheduled for:  04-09-2023 at 0900 am           Consent to Services:  Patient was given information about care management services, agreed to services, and gave  verbal consent to participate.   Plan: Telephone follow up appointment with care management team member scheduled  for: 04-09-2023 at 0900 am  Alto Denver RN, MSN, CCM RN Care Manager  [facility]  Ambulatory Care Management  Direct Number: [phone]

## 2023-02-19 NOTE — Patient Instructions (Addendum)
Visit Information  Thank you for taking time to visit with me today. Please don't hesitate to contact me if I can be of assistance to you before our next scheduled telephone appointment.  Following are the goals we discussed today:   Goals Addressed             This Visit's Progress    RNCM Care Management for Chronic Disease Management: COPD, HTN, HIV       Current Barriers:  Knowledge Deficits related to plan of care for management of HTN, COPD, and HIV  Chronic Disease Management support and education needs related to HTN, COPD, and HIV  Non-adherence to prescribed medication regimen Non-compliance with the medical plan of care Currently lives with his sister, him and his wife are separated, he ask for his wife's number to be removed from contacts  RNCM Clinical Goal(s):  Patient will verbalize basic understanding of  HTN, COPD, and HIV disease process and self health management plan as evidenced by seeing the provider at scheduled visits, following the plan of care, lab work stable, and trips to the ED reduced take all medications exactly as prescribed and will call provider for medication related questions as evidenced by of compliance with medication regimen and working with pharm D to understand medications/changes/questions/concerns attend all scheduled medical appointments: with pcp and specialist as evidenced by keeping appointments  demonstrate Improved and Ongoing health management independence as evidenced by effective management of COPD, HTN, HIV, and other chronic conditions  continue to work with RN Care Manager to address care management and care coordination needs related to  HTN, COPD, and HIV as evidenced by adherence to CM Team Scheduled appointments work with pharmacist to address medication reconciliation and understanding his medication regimen related toHTN, COPD, and HIV and other chronic conditions  as evidenced by review or EMR and patient or pharmacist  report demonstrate a decrease in HTN, COPD, and HIV exacerbations as evidenced by following the plan of care and no acute ED visits or hospital admissions demonstrate ongoing self health care management ability to effectively manage chronic conditions  as evidenced by working with the team to establish adherence of the care plan  through collaboration with RN Care manager, provider, and care team.   Interventions: Evaluation of current treatment plan related to  self management and patient's adherence to plan as established by provider   COPD Interventions:  (Status:  New goal. and Goal on track:  Yes.) Long Term Goal Provided patient with basic written and verbal COPD education on self care/management/and exacerbation prevention Advised patient to track and manage COPD triggers Provided instruction about proper use of medications used for management of COPD including inhalers Advised patient to self assesses COPD action plan zone and make appointment with provider if in the yellow zone for 48 hours without improvement Advised patient to engage in light exercise as tolerated 3-5 days a week to aid in the the management of COPD Provided education about and advised patient to utilize infection prevention strategies to reduce risk of respiratory infection Discussed the importance of adequate rest and management of fatigue with COPD Screening for signs and symptoms of depression related to chronic disease state  Assessed social determinant of health barriers Several ED and Hospital visits for COPD exacerbation in the last 6 months   HIV Management  (Status:  New goal. and Goal on track:  Yes.)  Long Term Goal Evaluation of current treatment plan related to  HIV , Family and relationship dysfunction,  Literacy concerns, Memory Deficits, and and history of non-compliance to the plan of care  self-management and patient's adherence to plan as established by provider. Discussed plans with patient for  ongoing care management follow up and provided patient with direct contact information for care management team Evaluation of current treatment plan related to HIV management  and patient's adherence to plan as established by provider Reviewed medications with patient and discussed compliance and reviewed upcoming pharm D appointment Reviewed scheduled/upcoming provider appointments including 03-19-2023 at 230 pm with the pcp, reminder given today Pharmacy referral for medication reconciliation, and adherence. Has an appointment for 02-24-2023 and 10 am, reminder give today Discussed plans with patient for ongoing care management follow up and provided patient with direct contact information for care management team Advised patient to discuss changes in HIV disease process/questions/concerns with provider Screening for signs and symptoms of depression related to chronic disease state  Assessed social determinant of health barriers  Hypertension Interventions:  (Status:  New goal. and Goal on track:  Yes.) Long Term Goal Last practice recorded BP readings:  BP Readings from Last 3 Encounters:  02/10/23 128/78  01/11/23 134/85  10/19/22 138/82    Most recent eGFR/CrCl:  Lab Results  Component Value Date   EGFR 68 02/12/2023    No components found for: "CRCL"  Evaluation of current treatment plan related to hypertension self management and patient's adherence to plan as established by provider Provided education to patient re: stroke prevention, s/s of heart attack and stroke Reviewed medications with patient and discussed importance of compliance Discussed plans with patient for ongoing care management follow up and provided patient with direct contact information for care management team Reviewed scheduled/upcoming provider appointments including: 03-19-2023 at 230 pm with pcp Advised patient to discuss changes in HTN or heart healthy with provider Provided education on prescribed diet  review of heart healthy diet. The patient states he has a good appetite and does not use salt Discussed complications of poorly controlled blood pressure such as heart disease, stroke, circulatory complications, vision complications, kidney impairment, sexual dysfunction Screening for signs and symptoms of depression related to chronic disease state  Assessed social determinant of health barriers    Patient Goals/Self-Care Activities: Take all medications as prescribed Attend all scheduled provider appointments Call pharmacy for medication refills 3-7 days in advance of running out of medications Call provider office for new concerns or questions  call the Suicide and Crisis Lifeline: 988 call the [place] National Suicide Prevention Lifeline: 810 588 6810 or TTY: (938)601-4116 TTY 972-501-2779) to talk to a trained counselor call 1-800-273-TALK (toll free, 24 hour hotline) go to [facility] [address], [place] [phone]) if experiencing a Mental Health or Behavioral Health Crisis  eliminate smoking in my home identify and remove indoor air pollutants limit outdoor activity during cold weather do breathing exercises every day develop a rescue plan eliminate symptom triggers at home follow rescue plan if symptoms flare-up learn about high blood pressure call doctor for signs and symptoms of high blood pressure keep all doctor appointments take medications for blood pressure exactly as prescribed  Follow Up Plan:  Telephone follow up appointment with care management team member scheduled for:  04-09-2023 at 0900 am           Our next appointment is by telephone on 04-09-2023 at 0900 am  Please call the care guide team at [phone] if you need to cancel or reschedule your appointment.   If you are  experiencing a Mental Health or Behavioral Health Crisis or need someone to talk to, please call the Suicide and Crisis Lifeline: 988 call  the [place] National Suicide Prevention Lifeline: (832) 516-7449 or TTY: 863-811-5072 TTY (754)211-6906) to talk to a trained counselor call 1-800-273-TALK (toll free, 24 hour hotline) go to [facility] [address] [place] [phone])   Following is a copy of your full plan of care:  There are no care plans that you recently modified to display for this patient.   Mr. Heinzel was given information about Care Management services by the embedded care coordination team including:  Care Management services include personalized support from designated clinical staff supervised by his physician, including individualized plan of care and coordination with other care providers 24/7 contact phone numbers for assistance for urgent and routine care needs. The patient may stop CCM services at any time (effective at the end of the month) by phone call to the office staff.  Patient agreed to services and verbal consent obtained.   The patient verbalized understanding of instructions, educational materials, and care plan provided today and DECLINED offer to receive copy of patient instructions, educational materials, and care plan.    Alto Denver RN, MSN, CCM RN Care Manager  [facility]  Direct Number: [phone]   Cooking With Less Salt Cooking with less salt is one way to reduce the amount of salt (sodium) you get from food. Most people should have less than 2,300 milligrams (mg) of sodium each day. If you have high blood pressure (hypertension), you may need to limit your sodium to 1,500 mg each day. Follow the tips below to help reduce your sodium intake. What are tips for eating less sodium? Reading food labels  Check the food label before buying or using packaged ingredients. Always check the label for the serving size and sodium content. Choose products with less than 140 mg of sodium per serving. Check the % Daily Value  column to see what percent of the daily recommended amount of sodium is in one serving of the product. Foods with 5% or less are low in sodium. Foods with 20% or more are high in sodium. Do not choose foods that have salt as one of the first three ingredients on the ingredients list. Always check how much sodium is in a product, even if the label says "unsalted" or "no salt added." Shopping Buy sodium-free or low-sodium products. Look for these words: Low-sodium. Sodium-free. Reduced-sodium. No salt added. Unsalted. Buy fresh or frozen foods without sauces or additives. Cooking Instead of salt, use herbs, seasonings without salt, and spices. Use sodium-free baking soda. Grill, braise, or roast foods to add flavor with less salt. Do not add salt to pasta, rice, or hot cereals. Drain and rinse canned vegetables, beans, and meat before use. Do not add salt when cooking sweets and desserts. Cook with low-sodium ingredients. Meal planning The sodium in bread can add up. Try to plan meals with other grains. These may include whole oats, quinoa, whole wheat pasta, and other whole grains that do not have sodium added to them. What foods are high in sodium? Vegetables Regular canned vegetables, except low-sodium or reduced-sodium items. Sauerkraut, pickled vegetables, and relishes. Olives. [place] fries. Onion rings. Regular canned tomato sauce and paste. Regular tomato and vegetable juice. Frozen vegetables in sauces. Grains Instant hot cereals. Bread stuffing, pancake, and biscuit mixes. Croutons. Seasoned rice or pasta mixes. Noodle soup cups. Boxed or  frozen macaroni and cheese. Regular salted crackers. Self-rising flour. Rolls. Bagels. Flour tortillas and wraps. Meats and other proteins Meat or fish that is salted, canned, smoked, cured, spiced, or pickled. Precooked or cured meat, such as sausages or meat loaves. Tomasa Blase. Ham. Pepperoni. Hot dogs. Corned beef. Chipped beef. Salt pork. Jerky.  Pickled herring, anchovies, and sardines. Regular canned tuna. Salted nuts. Dairy Processed cheese and cheese spreads. Hard cheeses. Cheese curds. Blue cheese. Feta cheese. String cheese. Regular cottage cheese. Buttermilk. Canned milk. The items listed above may not be a full list of foods high in sodium. Talk to a dietitian to learn more. What foods are low in sodium? Fruits Fresh, frozen, or canned fruit with no sauce added. Fruit juice. Vegetables Fresh or frozen vegetables with no sauce added. "No salt added" canned vegetables. "No salt added" tomato sauce and paste. Low-sodium or reduced-sodium tomato and vegetable juice. Grains Noodles, pasta, quinoa, rice. Shredded or puffed wheat or puffed rice. Regular or quick oats (not instant). Low-sodium crackers. Low-sodium bread. Whole grain bread and whole grain pasta. Unsalted popcorn. Meats and other proteins Fresh or frozen whole meats, poultry that has not been injected with sodium, and fish with no sauce added. Unsalted nuts. Dried peas, beans, and lentils without added salt. Unsalted canned beans. Eggs. Unsalted nut butters. Low-sodium canned tuna or chicken. Dairy Milk. Soy milk. Yogurt. Low-sodium cheeses, such as Swiss, [address], [place], and [employer]. Sherbet or ice cream (keep to  cup per serving). Cream cheese. Fats and oils Unsalted butter or margarine. Other foods Homemade pudding. Sodium-free baking soda and baking powder. Herbs and spices. Low-sodium seasoning mixes. Beverages Coffee and tea. Carbonated beverages. The items listed above may not be a full list of foods low in sodium. Talk to a dietitian to learn more. What are some salt alternatives when cooking? Herbs, seasonings, and spices can be used instead of salt to flavor your food. Herbs should be fresh or dried. Do not choose packaged mixes. Next to the name of the herb, spice, or seasoning below are some foods you can pair it with. Herbs [place] leaves - Soups,  meat and vegetable dishes, and spaghetti sauce. Basil - [employer], soups, pasta, and fish dishes. Cilantro - Meat, poultry, and vegetable dishes. Chili powder - Marinades and Mexican dishes. Chives - Salad dressings and potato dishes. Cumin - Mexican dishes, couscous, and meat dishes. Dill - Fish dishes, sauces, and salads. Fennel - Meat and vegetable dishes, breads, and cookies. Garlic (do not use garlic salt) - [place] dishes, meat dishes, salad dressings, and sauces. Marjoram - Soups, potato dishes, and meat dishes. Oregano - Pizza and spaghetti sauce. Parsley - Salads, soups, pasta, and meat dishes. Rosemary - [place] dishes, salad dressings, soups, and red meats. Saffron - Fish dishes, pasta, and some poultry dishes. Sage - Stuffings and sauces. Tarragon - Fish and Whole Foods. Thyme - Stuffing, meat, and fish dishes. Seasonings Lemon juice - Fish dishes, poultry dishes, vegetables, and salads. Vinegar - Salad dressings, vegetables, and fish dishes. Spices Cinnamon - Sweet dishes, such as cakes, cookies, and puddings. Cloves - Gingerbread, puddings, and marinades for meats. Curry - Vegetable dishes, fish and poultry dishes, and stir-fry dishes. Ginger - Vegetable dishes, fish dishes, and stir-fry dishes. Nutmeg - Pasta, vegetables, poultry, fish dishes, and custard. This information is not intended to replace advice given to you by your health care provider. Make sure you discuss any questions you have with your health care provider. Document Revised: 05/14/2022 Document  Reviewed: 05/07/2022 Elsevier Patient Education  2024 [employer] Inc.    Heart-Healthy Eating Plan Eating a healthy diet is important for the health of your heart. A heart-healthy eating plan includes: Eating less unhealthy fats. Eating more healthy fats. Eating less salt in your food. Salt is also called sodium. Making other changes in your diet. Talk with your doctor or a diet specialist (dietitian)  to create an eating plan that is right for you. What is my plan? Your doctor may recommend an eating plan that includes: Total fat: ______% or less of total calories a day. Saturated fat: ______% or less of total calories a day. Cholesterol: less than _________mg a day. Sodium: less than _________mg a day. What are tips for following this plan? Cooking Avoid frying your food. Try to bake, boil, grill, or broil it instead. You can also reduce fat by: Removing the skin from poultry. Removing all visible fats from meats. Steaming vegetables in water or broth. Meal planning  At meals, divide your plate into four equal parts: Fill one-half of your plate with vegetables and green salads. Fill one-fourth of your plate with whole grains. Fill one-fourth of your plate with lean protein foods. Eat 2-4 cups of vegetables per day. One cup of vegetables is: 1 cup (91 g) broccoli or cauliflower florets. 2 medium carrots. 1 large bell pepper. 1 large sweet potato. 1 large tomato. 1 medium white potato. 2 cups (150 g) raw leafy greens. Eat 1-2 cups of fruit per day. One cup of fruit is: 1 small apple 1 large banana 1 cup (237 g) mixed fruit, 1 large orange,  cup (82 g) dried fruit, 1 cup (240 mL) 100% fruit juice. Eat more foods that have soluble fiber. These are apples, broccoli, carrots, beans, peas, and barley. Try to get 20-30 g of fiber per day. Eat 4-5 servings of nuts, legumes, and seeds per week: 1 serving of dried beans or legumes equals  cup (90 g) cooked. 1 serving of nuts is  oz (12 almonds, 24 pistachios, or 7 walnut halves). 1 serving of seeds equals  oz (8 g). General information Eat more home-cooked food. Eat less restaurant, buffet, and fast food. Limit or avoid alcohol. Limit foods that are high in starch and sugar. Avoid fried foods. Lose weight if you are overweight. Keep track of how much salt (sodium) you eat. This is important if you have high blood  pressure. Ask your doctor to tell you more about this. Try to add vegetarian meals each week. Fats Choose healthy fats. These include olive oil and canola oil, flaxseeds, walnuts, almonds, and seeds. Eat more omega-3 fats. These include salmon, mackerel, sardines, tuna, flaxseed oil, and ground flaxseeds. Try to eat fish at least 2 times each week. Check food labels. Avoid foods with trans fats or high amounts of saturated fat. Limit saturated fats. These are often found in animal products, such as meats, butter, and cream. These are also found in plant foods, such as palm oil, palm kernel oil, and coconut oil. Avoid foods with partially hydrogenated oils in them. These have trans fats. Examples are stick margarine, some tub margarines, cookies, crackers, and other baked goods. What foods should I eat? Fruits All fresh, canned (in natural juice), or frozen fruits. Vegetables Fresh or frozen vegetables (raw, steamed, roasted, or grilled). Green salads. Grains Most grains. Choose whole wheat and whole grains most of the time. Rice and pasta, including brown rice and pastas made with whole wheat. Meats and other  proteins Lean, well-trimmed beef, veal, pork, and lamb. Chicken and [place] without skin. All fish and shellfish. Wild duck, rabbit, pheasant, and venison. Egg whites or low-cholesterol egg substitutes. Dried beans, peas, lentils, and tofu. Seeds and most nuts. Dairy Low-fat or nonfat cheeses, including ricotta and mozzarella. Skim or 1% milk that is liquid, powdered, or evaporated. Buttermilk that is made with low-fat milk. Nonfat or low-fat yogurt. Fats and oils Non-hydrogenated (trans-free) margarines. Vegetable oils, including soybean, sesame, sunflower, olive, peanut, safflower, corn, canola, and cottonseed. Salad dressings or mayonnaise made with a vegetable oil. Beverages Mineral water. Coffee and tea. Diet carbonated beverages. Sweets and desserts Sherbet, gelatin, and fruit  ice. Small amounts of dark chocolate. Limit all sweets and desserts. Seasonings and condiments All seasonings and condiments. The items listed above may not be a complete list of foods and drinks you can eat. Contact a dietitian for more options. What foods should I avoid? Fruits Canned fruit in heavy syrup. Fruit in cream or butter sauce. Fried fruit. Limit coconut. Vegetables Vegetables cooked in cheese, cream, or butter sauce. Fried vegetables. Grains Breads that are made with saturated or trans fats, oils, or whole milk. Croissants. Sweet rolls. Donuts. High-fat crackers, such as cheese crackers. Meats and other proteins Fatty meats, such as hot dogs, ribs, sausage, bacon, rib-eye roast or steak. High-fat deli meats, such as salami and bologna. Caviar. Domestic duck and goose. Organ meats, such as liver. Dairy Cream, sour cream, cream cheese, and creamed cottage cheese. Whole-milk cheeses. Whole or 2% milk that is liquid, evaporated, or condensed. Whole buttermilk. Cream sauce or high-fat cheese sauce. Yogurt that is made from whole milk. Fats and oils Meat fat, or shortening. Cocoa butter, hydrogenated oils, palm oil, coconut oil, palm kernel oil. Solid fats and shortenings, including bacon fat, salt pork, lard, and butter. Nondairy cream substitutes. Salad dressings with cheese or sour cream. Beverages Regular sodas and juice drinks with added sugar. Sweets and desserts Frosting. Pudding. Cookies. Cakes. Pies. Milk chocolate or white chocolate. Buttered syrups. Full-fat ice cream or ice cream drinks. The items listed above may not be a complete list of foods and drinks to avoid. Contact a dietitian for more information. Summary Heart-healthy meal planning includes eating less unhealthy fats, eating more healthy fats, and making other changes in your diet. Eat a balanced diet. This includes fruits and vegetables, low-fat or nonfat dairy, lean protein, nuts and legumes, whole grains, and  heart-healthy oils and fats. This information is not intended to replace advice given to you by your health care provider. Make sure you discuss any questions you have with your health care provider. Document Revised: 05/26/2021 Document Reviewed: 05/26/2021 Elsevier Patient Education  2024 [employer].

## 2023-02-24 ENCOUNTER — Encounter: Payer: Self-pay | Admitting: Pharmacist

## 2023-02-24 ENCOUNTER — Other Ambulatory Visit: Payer: Medicare Other

## 2023-02-24 NOTE — Progress Notes (Deleted)
02/24/2023 Name: Charles Daniel MRN: [id number] DOB: December 13, 1940  Subjective  No chief complaint on file.  Reason for visit: Charles Daniel is a 82 y.o. year old male who presented for a telephone visit.   They were referred to the pharmacist by their PCP for assistance in managing hypertension and COPD/Asthma .   Care Team: Primary Care Provider: [name], MD {careteamprovider:27366}  Reason for visit: ?  Charles Daniel is a 82 y.o. male with a history of HTN, TIA, COPD w recurrent exacerbation, prostate cancer, CKD3, Hx HepB, who presents today for a hypertension pharmacotherapy visit.?     Date of Last Hypertension Related Visit: *** with PCP Charles Boyden, MD Recent History: ?  10/18: Case management; Reviewed medication list/understanding and medication adherence. ?     Medication Adherence and Access: Prescription drug coverage: Payor: MEDICARE / Plan: MEDICARE PART A AND B / Product Type: *No Product type* / . Reports that all medications are *** affordable.  Current Patient Assistance: None*** Denies missed doses of oral medications. {Blank single:19197::"needs","does not need"} prescriptions for medications today.   Since Last visit / History of Present Illness: ?  Patient reports implementing*** plan from last visit. Denies adverse effects with titration of ***.   Reported HTN Regimen: ?  Amlodipine 10 mg daily Lisinopril/hydrochlorothiazide 20-12.5 mg daily; 2 tablets daily   HTN medications tried in the past:?  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 {Blank single:19197::"yes","no"} {HF MEDS CURRENT:210917263} History of hyperlipidemia? yes History of chronic kidney disease? yes Current BMI: *** kg/m2 (Ht 65 in, Wt 59.4 kg) Taking statin? yes; high intensity (atorvastatin 40 mg daily) Taking aspirin? indicated (secondary prevention); Taking   Taking SGLT-2i? no Taking GLP- 1 RA? no   COPD Current medications:  Breztri 160-9-4.8 - Inhale 2 puffs into the lungs in the morning and at bedtime.  Albuterol 2 puffs q6h prn  Previous medications: Trelegy   Cost concerns with current inhalers: *** Adherence to maintenance regimen: Confirms regular daily use***  Confirms rinsing his mouth after each use of ***.  Frequency of albuterol use: ***  {GOLD Stage:31491} {MMRC:31490}  HIV Current meds: Dovato Pt was switched from genvoya to dovato in April , 2024 to avoid taf/cobi.  Previously had cost concerns with HIV medications.     _______________________________________________  Objective    Review of Systems:?  Constitutional:? No fever, chills or unintentional weight loss  Cardiovascular:? No chest pain or pressure, shortness of breath, dyspnea on exertion, orthopnea or LE edema  Pulmonary:? No cough or shortness of breath  GI:? No nausea, vomiting, constipation, diarrhea, abdominal pain, dyspepsia, change in bowel habits  Endocrine:? No polyuria, polyphagia or blurred vision  Psych:? No depression, anxiety, insomnia    Physical Examination:  Vitals:  Wt Readings from Last 3 Encounters:  02/12/23 131 lb (59.4 kg)  02/10/23 128 lb 6 oz (58.2 kg)  01/11/23 127 lb (57.6 kg)   BP Readings from Last 3 Encounters:  02/10/23 128/78  01/11/23 134/85  10/19/22 138/82   Pulse Readings from Last 3 Encounters:  02/10/23 73  01/11/23 84  10/19/22 (!) 102  Labs:?     Chemistry   Metabolic Risk Considerations  Lab Results  Component Value Date   NA 142 02/12/2023   K  3.7 02/12/2023   CL 103 02/12/2023   CO2 30 02/12/2023   BUN 14 02/12/2023   CREATININE 1.09 02/12/2023   CALCIUM 9.6 02/12/2023   MG 2.1 08/19/2022   PHOS 4.0 02/10/2023   Lab Results  Component Value Date   HGBA1C 5.5 02/10/2023   GLUCOSE 78 02/12/2023   MICRALBCREAT 1.7 02/10/2023   CREATININE 1.09 02/12/2023   CREATININE 0.95 02/10/2023   CREATININE 0.98 01/11/2023   GFRNONAA >60 08/21/2022   GFRNONAA 49 (L) 08/19/2022   GFRNONAA 47 (L) 08/18/2022    Lab Results  Component Value Date   CHOL 182 02/10/2023   HDL 76.00 02/10/2023   HDL 39.20 11/11/2021   HDL 34.20 (L) 03/18/2020   AST 16 02/12/2023   AST 17 02/10/2023   ALT 31 02/12/2023   ALT 36 02/10/2023     The ASCVD Risk score (Arnett DK, et al., 2019) failed to calculate for the following reasons:   The 2019 ASCVD risk score is only valid for ages 72 to 51   The patient has a prior MI or stroke diagnosis  Assessment and Plan:   1. Hypertension controlled per last last clinic reading of 128/78 mmHg (02/10/23) with goal <130/80 mmHg. SMBP readings show ***. Denies s/sx hypo/hypertension. Most recent labs on *** indicate stable renal function/electrolytes (eGFR 68, Na 142, K 3.7).  Current Regimen: Amlodipine 10 mg daily, Lisinopril/hydrochlorothiazide 20-12.5 mg daily; 2 tablets daily Diet/Sodium/Caffeine/Tobacco: *** Exercise: ***  Continue medications today without changes.  Labs: Up to date and stable on RAAS/thiazide  Future Consideration:    2. Metabolic Risk Reduction: History of clinical ASCVD (TIA). Lipid panel on 02/10/23 with LDL of 92 mg/dL which is above goal of <55 mg/dL for secondary prevention.  Key risk factors include: {hypertension hyperlipidemia current smoker (***) sedentary lifestyle peripheral artery disease/claudication Prior CVA/TIA} Current Regimen: atorvastatin 40 mg daily Continue medications today without changes. Future Consideration: Reasonable to increase statin to achieve LDL  goal, though at this time will focus on general adherence and getting him back on track with his medications.    3. Healthcare Maintenance:  Pneumococcal - Current status: (PPSV23 09/2019, 07/2005; PCV13 01/21/26  Based on shared clinical decision-making, decide whether to administer one dose of PCV20 or PCV21 at least 5 years after the last pneumococcal vaccine dose. Consider based on high-risk patient (recurrent COPD exacerbations, immunocompromised) Shingles - Current status: DUE (no record) Influenza - Current status: Up to date, last 01/11/23  Tdap - DUE (last 08/2012, recommended every 10 years) Covid19 - Up to date (06/2019, 07/2019, 04/01/22, 02/12/23) Due to receive the following vaccines: Tdap, Shingrix, and PCV20   Follow Up Follow up with clinical pharmacist via *** in *** weeks.  ?   Future Appointments  Date Time Provider Department Center  02/24/2023 10:00 AM [facility] CCM PHARMACIST [facility]  03/19/2023  2:30 PM Charles Boyden, MD [facility]  04/09/2023  9:00 AM Marlowe Sax, RN [facility] None     Loree Fee, PharmD Clinical Pharmacist [facility] [phone]

## 2023-02-24 NOTE — Progress Notes (Signed)
Attempted to contact patient for scheduled appointment for medication management to review blood pressure and COPD. We spoke for a minute, but then patient requested a call back at a different time due to not feeling well.   Will attempt outreach again this afternoon.   Future Appointments  Date Time Provider Department Center  03/19/2023  2:30 PM [name], MD [facility]  04/09/2023  9:00 AM Marlowe Sax, RN [facility] None   Loree Fee, PharmD Clinical Pharmacist [facility] [phone]

## 2023-03-17 ENCOUNTER — Telehealth: Payer: Self-pay | Admitting: Family Medicine

## 2023-03-17 NOTE — Telephone Encounter (Signed)
This is listed as historical provider in chart. Do not see where you have given.

## 2023-03-17 NOTE — Telephone Encounter (Signed)
Prescription Request  03/17/2023  LOV: 02/10/2023  What is the name of the medication or equipment? levETIRAcetam (KEPPRA) 250 MG tablet   predniSONE (DELTASONE) 20 MG tablet    Have you contacted your pharmacy to request a refill? No   Which pharmacy would you like this sent to?  [facility] [address], [place] - [address] [place] [address] Phone: [phone] Fax: [phone]    Patient notified that their request is being sent to the clinical staff for review and that they should receive a response within 2 business days.   Please advise at Mobile [phone] (mobile)

## 2023-03-19 ENCOUNTER — Encounter: Payer: Medicare Other | Admitting: Family Medicine

## 2023-03-19 NOTE — Telephone Encounter (Signed)
Looks like pt was prescribed prednisone after ED visit on 02/13/23 at [facility].

## 2023-03-19 NOTE — Telephone Encounter (Signed)
Patients niece called in to ask about these medication  and was wondering if patient is still supposed to be taking these medications?

## 2023-03-22 NOTE — Telephone Encounter (Signed)
Attempted to contact pt. No answer, no vm. Need to relay Dr Timoteo Expose messages and get answer to his questions.

## 2023-03-22 NOTE — Telephone Encounter (Addendum)
Plz notify - prednisone was a temporary medication for COPD exacerbation.   He was started on keppra anti-seizure medicine when hospitalized at [facility] ICU - plan was to follow up with neurologist (brain doctor) outpatient to see if he still needed this. Did he ever see neurology?   Also - did he keep urologist appt for high PSA and prostate cancer history? If not recommend he call [facility] to reschedule [phone].

## 2023-03-23 ENCOUNTER — Other Ambulatory Visit (HOSPITAL_COMMUNITY): Payer: Self-pay

## 2023-03-23 ENCOUNTER — Other Ambulatory Visit: Payer: Self-pay

## 2023-03-23 DIAGNOSIS — B20 Human immunodeficiency virus [HIV] disease: Secondary | ICD-10-CM

## 2023-03-23 MED ORDER — DOLUTEGRAVIR-LAMIVUDINE 50-300 MG PO TABS: 1.0000 | ORAL_TABLET | Freq: Every day | ORAL | 0 refills | Status: DC

## 2023-03-23 NOTE — Telephone Encounter (Signed)
Attempted to contact pt. No answer, no vm. Need to relay Dr Timoteo Expose messages and get answer to his questions.

## 2023-03-23 NOTE — Telephone Encounter (Signed)
Pt rtn call. I relayed Dr Timoteo Expose message. Pt confirms he takes Keppra. Says neuro appt is 04/19/23. Says he hasn't seen urology yet but will call and schedule an appt. Fyi to Dr Reece Agar.

## 2023-03-24 ENCOUNTER — Ambulatory Visit: Payer: Medicare Other | Admitting: Infectious Disease

## 2023-04-09 ENCOUNTER — Other Ambulatory Visit: Payer: Medicare Other

## 2023-04-09 ENCOUNTER — Ambulatory Visit: Payer: Self-pay

## 2023-04-09 NOTE — Patient Outreach (Signed)
  Care Coordination   04/09/2023 Name: Charles Daniel MRN: [id number] DOB: [birth year]   Care Coordination Outreach Attempts:  Contact answering phone identified herself as patients sister, [name] states patient is in the hospital.  Contact not listed on HIPAA. Unable to speak with her regarding patients personal health information.  HIPAA compliant message left with call back phone number.    Follow Up Plan:  Additional outreach attempts will be made to offer the patient care coordination information and services.   Encounter Outcome:  Will attempt return call.    Care Coordination Interventions:  No, not indicated    George Ina RN,BSN,CCM [facility]  [facility], [facility] coordinator / Case Manager Phone: [phone]

## 2023-04-12 ENCOUNTER — Telehealth: Payer: Self-pay

## 2023-04-12 ENCOUNTER — Other Ambulatory Visit (HOSPITAL_COMMUNITY): Payer: Self-pay

## 2023-04-12 NOTE — Telephone Encounter (Signed)
Received call from patient and his niece Talbert Forest. She reports there was a $1000 copay for his Dovato.   Sandie Ano, RN

## 2023-04-14 ENCOUNTER — Inpatient Hospital Stay: Payer: Medicare Other | Admitting: Family Medicine

## 2023-04-15 ENCOUNTER — Other Ambulatory Visit (HOSPITAL_COMMUNITY): Payer: Self-pay

## 2023-04-15 ENCOUNTER — Encounter: Payer: Self-pay | Admitting: Family Medicine

## 2023-04-19 ENCOUNTER — Ambulatory Visit: Payer: Medicare Other | Admitting: Internal Medicine

## 2023-04-27 ENCOUNTER — Encounter: Payer: Medicare Other | Admitting: Family Medicine

## 2023-05-18 ENCOUNTER — Ambulatory Visit: Payer: Medicare Other | Admitting: Internal Medicine

## 2023-05-21 ENCOUNTER — Inpatient Hospital Stay: Payer: Medicare PPO | Admitting: Family Medicine

## 2023-05-24 ENCOUNTER — Encounter: Payer: Self-pay | Admitting: Family Medicine

## 2023-12-03 DEATH — deceased
